# Patient Record
Sex: Male | Born: 1945 | Race: White | Hispanic: No | Marital: Married | Smoking: Never smoker
Health system: Southern US, Community
[De-identification: ages and names within clinical notes are randomized; demographics above are authoritative.]

## PROBLEM LIST (undated history)

## (undated) ENCOUNTER — Emergency Department (HOSPITAL_COMMUNITY): Admission: EM | Payer: Medicare PPO | Source: Home / Self Care

## (undated) DIAGNOSIS — R0609 Other forms of dyspnea: Secondary | ICD-10-CM

## (undated) DIAGNOSIS — E119 Type 2 diabetes mellitus without complications: Secondary | ICD-10-CM

## (undated) DIAGNOSIS — G4733 Obstructive sleep apnea (adult) (pediatric): Secondary | ICD-10-CM

## (undated) DIAGNOSIS — R972 Elevated prostate specific antigen [PSA]: Secondary | ICD-10-CM

## (undated) DIAGNOSIS — N529 Male erectile dysfunction, unspecified: Secondary | ICD-10-CM

## (undated) DIAGNOSIS — I10 Essential (primary) hypertension: Secondary | ICD-10-CM

## (undated) DIAGNOSIS — Z87442 Personal history of urinary calculi: Secondary | ICD-10-CM

## (undated) DIAGNOSIS — E785 Hyperlipidemia, unspecified: Secondary | ICD-10-CM

## (undated) DIAGNOSIS — R0600 Dyspnea, unspecified: Secondary | ICD-10-CM

## (undated) DIAGNOSIS — K219 Gastro-esophageal reflux disease without esophagitis: Secondary | ICD-10-CM

## (undated) DIAGNOSIS — E291 Testicular hypofunction: Secondary | ICD-10-CM

## (undated) DIAGNOSIS — N400 Enlarged prostate without lower urinary tract symptoms: Secondary | ICD-10-CM

## (undated) DIAGNOSIS — I6529 Occlusion and stenosis of unspecified carotid artery: Secondary | ICD-10-CM

## (undated) DIAGNOSIS — U071 COVID-19: Secondary | ICD-10-CM

## (undated) DIAGNOSIS — Z87898 Personal history of other specified conditions: Secondary | ICD-10-CM

## (undated) DIAGNOSIS — C4490 Unspecified malignant neoplasm of skin, unspecified: Secondary | ICD-10-CM

## (undated) DIAGNOSIS — N200 Calculus of kidney: Secondary | ICD-10-CM

## (undated) DIAGNOSIS — G4730 Sleep apnea, unspecified: Secondary | ICD-10-CM

## (undated) DIAGNOSIS — R011 Cardiac murmur, unspecified: Secondary | ICD-10-CM

## (undated) DIAGNOSIS — I453 Trifascicular block: Secondary | ICD-10-CM

## (undated) DIAGNOSIS — T7840XA Allergy, unspecified, initial encounter: Secondary | ICD-10-CM

## (undated) DIAGNOSIS — U099 Post covid-19 condition, unspecified: Secondary | ICD-10-CM

## (undated) HISTORY — DX: Calculus of kidney: N20.0

## (undated) HISTORY — DX: Gastro-esophageal reflux disease without esophagitis: K21.9

## (undated) HISTORY — DX: Essential (primary) hypertension: I10

## (undated) HISTORY — PX: OTHER SURGICAL HISTORY: SHX169

## (undated) HISTORY — DX: Allergy, unspecified, initial encounter: T78.40XA

## (undated) HISTORY — DX: Occlusion and stenosis of unspecified carotid artery: I65.29

## (undated) HISTORY — PX: KIDNEY STONE SURGERY: SHX686

## (undated) HISTORY — DX: Testicular hypofunction: E29.1

## (undated) HISTORY — DX: Elevated prostate specific antigen (PSA): R97.20

## (undated) HISTORY — DX: Hyperlipidemia, unspecified: E78.5

## (undated) HISTORY — DX: Cardiac murmur, unspecified: R01.1

---

## 2006-04-10 HISTORY — PX: KNEE ARTHROSCOPY: SUR90

## 2006-12-07 HISTORY — PX: COLONOSCOPY: SHX174

## 2007-04-11 HISTORY — PX: CARDIAC CATHETERIZATION: SHX172

## 2008-03-16 ENCOUNTER — Encounter: Payer: Self-pay | Admitting: Internal Medicine

## 2008-04-08 ENCOUNTER — Ambulatory Visit: Payer: Self-pay | Admitting: Internal Medicine

## 2008-04-08 DIAGNOSIS — E291 Testicular hypofunction: Secondary | ICD-10-CM

## 2008-04-08 DIAGNOSIS — K219 Gastro-esophageal reflux disease without esophagitis: Secondary | ICD-10-CM

## 2008-04-08 DIAGNOSIS — R972 Elevated prostate specific antigen [PSA]: Secondary | ICD-10-CM | POA: Insufficient documentation

## 2008-04-08 DIAGNOSIS — I10 Essential (primary) hypertension: Secondary | ICD-10-CM | POA: Insufficient documentation

## 2008-04-08 DIAGNOSIS — Z87442 Personal history of urinary calculi: Secondary | ICD-10-CM

## 2008-04-08 HISTORY — DX: Elevated prostate specific antigen (PSA): R97.20

## 2008-04-08 HISTORY — DX: Testicular hypofunction: E29.1

## 2008-04-08 HISTORY — DX: Gastro-esophageal reflux disease without esophagitis: K21.9

## 2008-05-29 ENCOUNTER — Ambulatory Visit: Payer: Self-pay | Admitting: Internal Medicine

## 2008-06-29 ENCOUNTER — Telehealth (INDEPENDENT_AMBULATORY_CARE_PROVIDER_SITE_OTHER): Payer: Self-pay | Admitting: *Deleted

## 2008-10-15 ENCOUNTER — Telehealth (INDEPENDENT_AMBULATORY_CARE_PROVIDER_SITE_OTHER): Payer: Self-pay | Admitting: *Deleted

## 2009-05-21 ENCOUNTER — Ambulatory Visit: Payer: Self-pay | Admitting: Internal Medicine

## 2010-02-11 ENCOUNTER — Ambulatory Visit: Payer: Self-pay | Admitting: Internal Medicine

## 2010-02-11 ENCOUNTER — Telehealth: Payer: Self-pay | Admitting: Internal Medicine

## 2010-02-11 ENCOUNTER — Encounter: Payer: Self-pay | Admitting: Internal Medicine

## 2010-02-11 DIAGNOSIS — N200 Calculus of kidney: Secondary | ICD-10-CM

## 2010-02-11 DIAGNOSIS — N201 Calculus of ureter: Secondary | ICD-10-CM

## 2010-02-11 DIAGNOSIS — R9431 Abnormal electrocardiogram [ECG] [EKG]: Secondary | ICD-10-CM | POA: Insufficient documentation

## 2010-02-11 HISTORY — DX: Calculus of ureter: N20.1

## 2010-02-11 HISTORY — DX: Calculus of kidney: N20.0

## 2010-02-14 ENCOUNTER — Telehealth (INDEPENDENT_AMBULATORY_CARE_PROVIDER_SITE_OTHER): Payer: Self-pay | Admitting: *Deleted

## 2010-02-15 ENCOUNTER — Ambulatory Visit: Payer: Self-pay

## 2010-02-15 ENCOUNTER — Encounter (INDEPENDENT_AMBULATORY_CARE_PROVIDER_SITE_OTHER): Payer: Self-pay | Admitting: *Deleted

## 2010-02-15 ENCOUNTER — Ambulatory Visit: Payer: Self-pay | Admitting: Cardiology

## 2010-02-15 ENCOUNTER — Ambulatory Visit: Payer: Self-pay | Admitting: Internal Medicine

## 2010-02-15 ENCOUNTER — Encounter: Payer: Self-pay | Admitting: Physician Assistant

## 2010-02-15 ENCOUNTER — Encounter: Payer: Self-pay | Admitting: Cardiology

## 2010-02-15 ENCOUNTER — Encounter (HOSPITAL_COMMUNITY)
Admission: RE | Admit: 2010-02-15 | Discharge: 2010-04-09 | Payer: Self-pay | Source: Home / Self Care | Attending: Internal Medicine | Admitting: Internal Medicine

## 2010-02-15 DIAGNOSIS — R9430 Abnormal result of cardiovascular function study, unspecified: Secondary | ICD-10-CM | POA: Insufficient documentation

## 2010-02-16 ENCOUNTER — Telehealth (INDEPENDENT_AMBULATORY_CARE_PROVIDER_SITE_OTHER): Payer: Self-pay | Admitting: *Deleted

## 2010-02-16 LAB — CONVERTED CEMR LAB
Basophils Absolute: 0 10*3/uL (ref 0.0–0.1)
Basophils Relative: 0.4 % (ref 0.0–3.0)
CO2: 27 meq/L (ref 19–32)
Calcium: 9.4 mg/dL (ref 8.4–10.5)
Creatinine, Ser: 1 mg/dL (ref 0.4–1.5)
Eosinophils Absolute: 0.2 10*3/uL (ref 0.0–0.7)
GFR calc non Af Amer: 77.89 mL/min (ref >60)
INR: 1.2 — ABNORMAL HIGH (ref 0.8–1.0)
Lymphocytes Relative: 22.2 % (ref 12.0–46.0)
MCHC: 35.2 g/dL (ref 30.0–36.0)
Neutrophils Relative %: 68.2 % (ref 43.0–77.0)
RBC: 4.8 M/uL (ref 4.22–5.81)
aPTT: 36.5 s — ABNORMAL HIGH (ref 21.7–28.8)

## 2010-02-17 ENCOUNTER — Inpatient Hospital Stay (HOSPITAL_BASED_OUTPATIENT_CLINIC_OR_DEPARTMENT_OTHER): Admission: RE | Admit: 2010-02-17 | Discharge: 2010-02-17 | Payer: Self-pay | Admitting: Cardiovascular Disease

## 2010-02-17 ENCOUNTER — Ambulatory Visit: Payer: Self-pay | Admitting: Cardiovascular Disease

## 2010-02-17 HISTORY — PX: CARDIAC CATHETERIZATION: SHX172

## 2010-02-21 LAB — CONVERTED CEMR LAB
ALT: 19 units/L (ref 0–53)
Albumin: 3.9 g/dL (ref 3.5–5.2)
Basophils Relative: 0.6 % (ref 0.0–3.0)
Bilirubin, Direct: 0.1 mg/dL (ref 0.0–0.3)
CO2: 28 meq/L (ref 19–32)
Chloride: 108 meq/L (ref 96–112)
Direct LDL: 150.3 mg/dL
Eosinophils Relative: 3.8 % (ref 0.0–5.0)
HCT: 41 % (ref 39.0–52.0)
Hemoglobin: 14.2 g/dL (ref 13.0–17.0)
Lymphs Abs: 1.4 10*3/uL (ref 0.7–4.0)
MCHC: 34.7 g/dL (ref 30.0–36.0)
MCV: 88.4 fL (ref 78.0–100.0)
Monocytes Absolute: 0.5 10*3/uL (ref 0.1–1.0)
Neutro Abs: 3.8 10*3/uL (ref 1.4–7.7)
PSA: 4.98 ng/mL — ABNORMAL HIGH (ref 0.10–4.00)
Potassium: 4.4 meq/L (ref 3.5–5.1)
RBC: 4.64 M/uL (ref 4.22–5.81)
TSH: 1.29 microintl units/mL (ref 0.35–5.50)
Total CHOL/HDL Ratio: 5
Total Protein: 6.3 g/dL (ref 6.0–8.3)
VLDL: 13.4 mg/dL (ref 0.0–40.0)
WBC: 5.9 10*3/uL (ref 4.5–10.5)

## 2010-02-24 ENCOUNTER — Ambulatory Visit: Payer: Self-pay | Admitting: Internal Medicine

## 2010-04-10 HISTORY — PX: CYSTOSCOPY WITH URETEROSCOPY, STONE BASKETRY AND STENT PLACEMENT: SHX6378

## 2010-05-10 NOTE — Progress Notes (Signed)
Summary: Nuclear pre procedure  Phone Note Outgoing Call Call back at [facility] Phone [phone]   Call placed by: Rea College, CMA,  February 14, 2010 4:02 PM Call placed to: Patient Summary of Call: Cindy left message with information on [employer] Information Sheet (see scanned document for details).      Nuclear Med Background Indications for Stress Test: Evaluation for Ischemia, Surgical Clearance  Indications Comments: Pending kidney surgery       Nuclear Pre-Procedure Cardiac Risk Factors: Hypertension Height (in): 72

## 2010-05-10 NOTE — Letter (Signed)
Summary: Cardiac Catheterization Instructions- [facility]  [employer], Main Office  [address]   [place], [place] [address]   Phone: [phone]  Fax: [phone]     02/15/2010 MRN: [id number]  [facility] [address] [place], [place]  [address]  Dear Mr. DUTKO,   You are scheduled for a Cardiac Catheterization on 02/17/10 with Dr. Clifton James  Please arrive to the 1st floor of the Heart and Vascular Center at [facility] at 8:30 AM on the day of your procedure. Please do not arrive before 6:30 a.m. Call the Heart and Vascular Center at [phone] if you are unable to make your appointmnet. The Code to get into the parking garage under the building is 2000. Take the elevators to the 1st floor. You must have someone to drive you home. Someone must be with you for the first 24 hours after you arrive home. Please wear clothes that are easy to get on and off and wear slip-on shoes. Do not eat or drink after midnight except water with your medications that morning. Bring all your medications and current insurance cards with you.  ___ DO NOT take these medications before your procedure: ________________________________________________________________  __x_ Make sure you take your aspirin.  ___x You may take ALL of your medications with water that morning. ________________________________________________________________________________________________________________________________  ___ DO NOT take ANY medications before your procedure.  ___ Pre-med instructions:  ________________________________________________________________________________________________________________________________  The usual length of stay after your procedure is 2 to 3 hours. This can vary.  If you have any questions, please call the office at the number listed above.   Ollen Gross, RN, BSN

## 2010-05-10 NOTE — Assessment & Plan Note (Signed)
Summary: FU / WANTS LABS /NWS  #   Vital Signs:  Patient profile:   65 year old male Height:      71 inches Weight:      207 pounds BMI:     28.97 O2 Sat:      97 % on Room air Temp:     97.7 degrees F oral Pulse rate:   65 / minute BP sitting:   136 / 72  (left arm) Cuff size:   regular  Vitals Entered ByZella Ball Ewing (May 21, 2009 8:57 AM)  O2 Flow:  Room air CC: followup/RE   CC:  followup/RE.  History of Present Illness: BP at home usuallyin the 150's at home;  no insurance today;  Pt denies CP, sob, doe, wheezing, orthopnea, pnd, worsening LE edema, palps, dizziness or syncope .  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 1 By Mouth Daily 2)  Aspirin 81 Mg Tbec (Aspirin) .Marland Kitchen.. 1 By Mouth Daily 3)  Amlodipine Besylate 10 Mg Tabs (Amlodipine Besylate) .Marland Kitchen.. 1 By Mouth Once Daily 4)  Fish Oil 300 Mg Caps (Omega-3 Fatty Acids) .... 2 By Mouth Daily 5)  St. Johns Wart 300mg  .... 2 By Mouth Daily 6)  Prevacid 30 Mg Cpdr (Lansoprazole) .Marland Kitchen.. 1 By Mouth Daily 7)  Dhea 25 Mg Tabs (Prasterone (Dhea)) .Marland Kitchen.. 1 By Mouth Daily 8)  Testosterone Cypionate 200 Mg/ml Oil (Testosterone Cypionate) .Marland Kitchen.. 1 Cc Im Q Month 9)  Viagra 100 Mg Tabs (Sildenafil Citrate) .Marland Kitchen.. 1 By Mouth Once Daily As Needed 10)  Labetalol Hcl 300 Mg Tabs (Labetalol Hcl) .Marland Kitchen.. 1po Two Times A Day  Current Medications (verified): 1)  Centrum Silver  Tabs (Multiple Vitamins-Minerals) .Marland Kitchen.. 1 By Mouth Daily 2)  Aspirin 81 Mg Tbec (Aspirin) .Marland Kitchen.. 1 By Mouth Daily 3)  Azor 10-20 Mg Tabs (Amlodipine-Olmesartan) .Marland Kitchen.. 1po Once Daily 4)  Fish Oil 300 Mg Caps (Omega-3  Fatty Acids) .... 2 By Mouth Daily 5)  St. Johns Wart 300mg  .... 2 By Mouth Daily 6)  Ranitidine Hcl 300 Mg Caps (Ranitidine Hcl) .Marland Kitchen.. 1po Once Daily 7)  Testosterone Cypionate 200 Mg/ml Oil (Testosterone Cypionate) .Marland Kitchen.. 1 Cc Im Q Month 8)  Viagra 100 Mg Tabs (Sildenafil Citrate) .Marland Kitchen.. 1 By Mouth Once Daily As Needed 9)  Labetalol Hcl 300 Mg Tabs (Labetalol Hcl) .Marland Kitchen.. 1po Two Times A Day 10)  Prevacid 30 Mg Cpdr (Lansoprazole) .Marland Kitchen.. 1 By Mouth Once Daily  Allergies (verified): 1)  Lipitor (Atorvastatin) 2)  * Diuretic  Past History:  Past Medical History: Last updated: 04/08/2008 Hypertension - severe, hard to control Nephrolithiasis, hx of hypogonadism GERD PSA increased   Past Surgical History: Last updated: 04/08/2008 s/p left knee arthroscopic surgury 2008  Social History: Last updated: 04/08/2008 Married 1 child work - Airline pilot - self employed Never Smoked Alcohol use-no  Risk Factors: Smoking Status: never (04/08/2008)  Review of Systems       all otherwise negative per pt -   Physical Exam  General:  alert and overweight-appearing.   Head:  normocephalic and atraumatic.   Eyes:  vision grossly intact, pupils equal, and pupils  round.   Ears:  R ear normal and L ear normal.   Nose:  no external deformity and no nasal discharge.   Mouth:  no gingival abnormalities and pharynx pink and moist.   Neck:  supple and no masses.   Lungs:  normal respiratory effort and normal breath sounds.   Heart:  normal rate and regular rhythm.   Extremities:  no edema, no erythema    Impression & Recommendations:  Problem # 1:  HYPERTENSION (ICD-401.9)  His updated medication list for this problem includes:    Azor 10-20 Mg Tabs (Amlodipine-olmesartan) .Marland Kitchen... 1po once daily    Labetalol Hcl 300 Mg Tabs (Labetalol hcl) .Marland Kitchen... 1po two times a day meds increased as above, cont to monitor at home, and f/u next visit;  states he has medicare insurance in oct 2011 - wants to f/u  then  Complete Medication List: 1)  Centrum Silver Tabs (Multiple vitamins-minerals) .Marland Kitchen.. 1 by mouth daily 2)  Aspirin 81 Mg Tbec (Aspirin) .Marland Kitchen.. 1 by mouth daily 3)  Azor 10-20 Mg Tabs (Amlodipine-olmesartan) .Marland Kitchen.. 1po once daily 4)  Fish Oil 300 Mg Caps (Omega-3 fatty acids) .... 2 by mouth daily 5)  St. Johns Wart 300mg   .... 2 by mouth daily 6)  Ranitidine Hcl 300 Mg Caps (Ranitidine hcl) .Marland Kitchen.. 1po once daily 7)  Testosterone Cypionate 200 Mg/ml Oil (Testosterone cypionate) .Marland Kitchen.. 1 cc im q month 8)  Viagra 100 Mg Tabs (Sildenafil citrate) .Marland Kitchen.. 1 by mouth once daily as needed 9)  Labetalol Hcl 300 Mg Tabs (Labetalol hcl) .Marland Kitchen.. 1po two times a day 10)  Prevacid 30 Mg Cpdr (Lansoprazole) .Marland Kitchen.. 1 by mouth once daily  Patient Instructions: 1)  stop the amlodipine 2)  start the azor 10/20 mg - 1 per day 3)  continue the labetolol 4)  Continue all previous medications as before this visit  5)  Please schedule a follow-up appointment in November 2011 for the "Initial Medicare Wellness exam" Prescriptions: PREVACID 30 MG CPDR (LANSOPRAZOLE) 1 by mouth once daily  #90 x 3   Entered and Authorized by:   [name] MD   Signed by:   [name] MD on 05/21/2009   Method used:   Print then Give to Patient   RxID:   [id number] AZOR 10-20 MG TABS (AMLODIPINE-OLMESARTAN) 1po once daily  #90 x 3   Entered and Authorized by:   [name] MD   Signed by:   [name] MD on 05/21/2009   Method used:   Print then Give to Patient   RxID:   [id number] VIAGRA 100 MG TABS (SILDENAFIL CITRATE) 1 by mouth once daily as needed  #24 x 5   Entered and Authorized by:   [name] MD   Signed by:   [name] MD on 05/21/2009   Method used:   Print then Give to Patient   RxID:   [id number] TESTOSTERONE CYPIONATE 200 MG/ML OIL (TESTOSTERONE CYPIONATE) 1 cc IM q month  #1 bottle x 11   Entered and Authorized by:   [name] MD   Signed by:   [name] MD on 05/21/2009   Method  used:   Print then Give to Patient   RxID:   [id number] RANITIDINE HCL 300 MG CAPS (RANITIDINE HCL) 1po once daily  #90 x 3   Entered and Authorized by:   [name] MD   Signed by:   [name] MD  on 05/21/2009   Method used:   Print then Give to Patient   RxID:   [id number]    Immunization History:  Tetanus/Td Immunization History:    Tetanus/Td:  historical (01/09/2007)  Influenza Immunization History:    Influenza:  historical (01/08/2009)

## 2010-05-10 NOTE — Progress Notes (Signed)
----   Converted from flag ---- ---- 02/11/2010 1:08 PM, Grant Levins MD wrote: the stress test is meant to be done prior, and he should be called very shortly - this is often completed in 5-7 days;  we can fax OK for surgury after the stress test done but we will need name and fax # of the urologist from pt  ---- 02/11/2010 1:04 PM, Grant Bowers CMA Grant Bowers) wrote: Grant Bowers wanted to know about his surgical clearance and would you be faxing his doctor in [place] the info. Also, will the stress test be scheduled before his surgery? ------------------------------  called pt informed of above information. He will call back  and give the fax# and name of urologist once he gets home.  patient's doctor in [place] is Grant Bowers at [facility] fax#[phone] Attention: Grant Bowers, patients chart # is [id number]  noted .sign

## 2010-05-10 NOTE — Cardiovascular Report (Signed)
Summary: Pre Cath Orders   Pre Cath Orders   Imported By: Roderic Ovens 02/28/2010 16:35:25  _____________________________________________________________________  External Attachment:    Type:   Image     Comment:   External Document

## 2010-05-10 NOTE — Assessment & Plan Note (Signed)
Summary: add on/ abn. stress/saf   Visit Type:  add on from nuclear Primary Provider:  [name] MD  CC:  high BP .  History of Present Illness: Grant Bowers is a 65 yo male with HTN and Hyperlipidemia who has a ureteral stone that requires surgical excision.  His urologist is in [place], [place] where he lives.  His PCP is Dr. Jonny Ruiz who recently saw him for surgical clearance.  He was noted to have an abnormal EKG and was referred for a stress test today.  His images were noted to be abnormal and he was added on to my schedule.  The patient notes he had a heart cath 2 years ago in [place], [place].  He states he was set up for a stress test that was stopped early because of BP.  He says his cath was normal and required no follow up.  The nuclear images have not been officially read.  However, the patient's ejection fraction appears to be 63%.  He appears to have an inferior and apical defect but actually improves with stress.  The patient denies chest pain, dyspnea, orthopnea, PND, pedal edema or syncope.  The patient describes NYHA Class 1-2 symptoms.  He denies exertional arm or jaw pain.  He denies palpitations.  Current Medications (verified): 1)  Centrum Silver  Tabs (Multiple Vitamins-Minerals) .Marland Kitchen.. 1 By Mouth Daily 2)  Aspirin 81 Mg Tbec (Aspirin) .Marland Kitchen.. 1 By Mouth Daily 3)  Azor 10-20 Mg Tabs (Amlodipine-Olmesartan) .Marland Kitchen.. 1po Once Daily 4)  Fish Oil 300 Mg Caps (Omega-3 Fatty Acids) .... 2 By Mouth Daily 5)  St. Johns Wart 300mg  .... 2 By Mouth Daily 6)  Testosterone Cypionate 200 Mg/ml Oil (Testosterone Cypionate) .Marland Kitchen.. 1 Cc Im Q Month 7)  Viagra 100 Mg Tabs (Sildenafil Citrate) .Marland Kitchen.. 1 By Mouth Once Daily As Needed 8)  Labetalol Hcl 300 Mg Tabs (Labetalol Hcl) .Marland Kitchen.. 1po Two Times A Day 9)  Ranitidine Hcl 150 Mg Caps (Ranitidine Hcl) .Marland Kitchen.. 1po Two Times A Day  Allergies (verified): 1)  Lipitor (Atorvastatin) 2)  * Diuretic  Past History:  Past Medical History: Hypertension - severe,  hard to control Hyperlipidemia (zocor started 02/2010) Nephrolithiasis, hx of hypogonadism GERD PSA increased  Cardiac cath at [facility] in 2009: "normal" per patient done for chest pain and abnl stress test  Past Surgical History: Reviewed history from 04/08/2008 and no changes required. s/p left knee arthroscopic surgury 2008  Family History: mother with stroke, died with cancer, rena l stones father with melanoma, HTN, DM, renal stones, pacemaker at 53 brother with colon polyps CAD- bro with PCI at age 19  Social History: Reviewed history from 02/11/2010 and no changes required. 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  The patient denies fevers, chills, cough, melena, hematochezia, rashes, arthralgias, myalgias, heat or cold intolerance.  Please see the HPI.  All other systems reviewed and negative.   Vital Signs:  Patient profile:   65 year old male Height:      72 inches Weight:      202 pounds BMI:     27.50 Pulse rate:   75 / minute BP sitting:   208 / 94  (left arm) Cuff size:   regular  Vitals Entered By: Hardin Negus, RMA (February 15, 2010 2:42 PM)  Physical Exam  General:  Well nourished, well developed in no acute distress HEENT: Normal Neck: No JVD Cardiac:  Normal S1, S2; +S4, 1-2/6 systolic murmur along LSB Lungs:  Clear to auscultation bilaterally, no wheezing, rhonchi or rales Abd: Soft, nontender, no hepatomegaly, no bruits Ext: No clubbing, cyanosis or edema Vascular: No carotid  bruits; Femoral arteries 2+ bilaterally without bruits Skin: Warm and dry MSK:  No deformities Lymph: No adenopathy Endocrine:  No thyromegaly Neuro: CNs 2-12 intact; nonfocal    Impression & Recommendations:  Problem # 1:  NONSPECIFIC ABNORMAL UNSPEC CV FUNCTION STUDY (ICD-794.30) He had an abnormal treadmill test 2 years  ago.  He proceeded to the heart catheterization.  The patient states that this was normal.  I discussed the patient's case today with Dr. Graciela Husbands.  He has an apical defect as well as an inferior defect.  These both improve with stress.  The question really is whether or not the patient has had an intercurrent infarct in the last 2 years.  He has no symptoms.  The best course of action would be to proceed with a cardiac catheterization for diagnostic purposes.  Risks and benefits have been explained to the patient and his wife.  He agrees to proceed.  The patient was also interviewed and examined by Dr. Graciela Husbands.  Problem # 2:  HYPERTENSION (ICD-401.9)  He has been out of his medications for 3 days.  We gave him some samples today of Azor 5/20 mg.  He gets his new insurance card in the next couple of days.  Orders: TLB-BMP (Basic Metabolic Panel-BMET) (80048-METABOL) TLB-CBC Platelet - w/Differential (85025-CBCD) TLB-PT (Protime) (85610-PTP) TLB-PTT (85730-PTTL)  Problem # 3:  RENAL CALCULUS (ICD-592.0) As above, we will proceed with cardiac catheterization.  As long as this shows no significant CAD, he will be cleared for his procedure.  Patient Instructions: 1)  Your physician has requested that you have a cardiac catheterization.  Cardiac catheterization is used to diagnose and/or treat various heart conditions. Doctors may recommend this procedure for a number of different reasons. The most common reason is to evaluate chest pain. Chest pain can be a symptom of coronary artery disease (CAD), and cardiac catheterization can show whether plaque is narrowing or blocking your heart's arteries. This procedure is also used to evaluate the valves, as well as measure the blood flow and oxygen levels in different parts of your heart.  For further information please visit [email or web address]  Please follow instruction sheet, as given.

## 2010-05-10 NOTE — Progress Notes (Signed)
  ROI faxed to [facility] @ [phone] Dr.Chauhan Fax [phone].Records received back gave to [facility]  February 16, 2010 2:30 PM]

## 2010-05-10 NOTE — Miscellaneous (Signed)
Summary: Appointment Canceled  Appointment status changed to canceled by [employer] on 02/11/2010 2:37 PM.  Cancellation Comments --------------------- CRS/Abnormal EKG/WT:204/Insur:Medicare/MD:Dr.John-mb  Appointment Information ----------------------- Appt Type:  CARDIOLOGY NUCLEAR TESTING      Date:  Tuesday, February 22, 2010      Time:  9:15 AM for 15 min   Urgency:  Routine   Made By:  Pearson Grippe  To Visit:  [id number]    Reason:  CRS/Abnormal EKG/WT:204/Insur:Medicare/MD:Dr.John-mb  Appt Comments ------------- -- 02/11/10 14:37: ([facility]) CANCELED -- CRS/Abnormal EKG/WT:204/Insur:Medicare/MD:Dr.John-mb -- 02/11/10 14:26: ([facility]) BOOKED -- Routine CARDIOLOGY NUCLEAR TESTING at 02/22/2010 9:15 AM for 15 min CRS/Abnormal EKG/WT:204/Insur:Medicare/MD:Dr.John-mb

## 2010-05-10 NOTE — Letter (Signed)
Summary: Generic Letter  [facility]  [address] [place], [place] [address]   Phone: [phone]  Fax: [phone]    02/24/2010  Grant Bowers [address] [place], [place]  [address]  Dear Grant Bowers,      This letter is to certify that you are cleared for any  surgical procedure from a medical and cardiac standpoint.         Sincerely,   Oliver Barre MD

## 2010-05-10 NOTE — Assessment & Plan Note (Signed)
Summary: Cardiology Nuclear Testing  Nuclear Med Background Indications for Stress Test: Evaluation for Ischemia, Surgical Clearance, Abnormal EKG  Indications Comments: Pending kidney surgery for stone removal.  History: Heart Catheterization  History Comments: 2-3  years ago: NL per patient.[facility] [place]).  Symptoms: DOE, Palpitations    Nuclear Pre-Procedure Cardiac Risk Factors: Family History - CAD, Hypertension, Lipids Caffeine/Decaff Intake: NONE NPO After: 8:00 PM Lungs: Clear IV 0.9% NS with Angio Cath: 22g     IV Site: R Wrist IV Started by: Cathlyn Parsons, RN Chest Size (in) 44     Height (in): 72 Weight (lb): 201 BMI: 27.36 Tech Comments: LABETALOL HELD X 12HRS. The patient has been off Azor 2 days.  Nuclear Med Study 1 or 2 day study:  1 day     Stress Test Type:  Stress Reading MD:  Willa Rough, MD     Referring MD:  Oliver Barre Resting Radionuclide:  Technetium 21m Tetrofosmin     Resting Radionuclide Dose:  11.0 mCi  Stress Radionuclide:  Technetium 40m Tetrofosmin     Stress Radionuclide Dose:  32.7 mCi   Stress Protocol Exercise Time (min):  7:01 min     Max HR:  142 bpm Max Systolic BP: 225 mm Hg     METS: 8.5 Rate Pressure Product:  73220    Stress Test Technologist:  Irean Hong,  RN     Nuclear Technologist:  Doyne Keel, CNMT  Rest Procedure  Myocardial perfusion imaging was performed at rest 45 minutes following the intravenous administration of Technetium 31m Tetrofosmin.  Stress Procedure  The patient exercised for 7 minutes and 01 second, RPE=15.  The patient stopped due to DOE, fatigue  and  chest pain less than a second when started walking treadmill. There were nonspecific ST-T wave changes, frequent PVC's. The patient had a hypertensive response to exercise.  Technetium 47m Tetrofosmin was injected at peak exercise and myocardial perfusion imaging was performed after a brief delay.The images reviewed by Dr Odessa Fleming, and the patient  will have an office visit with Tereso Newcomer, [facility]  now.   QPS Raw Data Images:  Patient motion noted; appropriate software correction applied. Stress Images:  Mild apical thinning Rest Images:  Same as stress Subtraction (SDS):  No evidence of ischemia. Transient Ischemic Dilatation:  0.88  (Normal <1.22)  Lung/Heart Ratio:  0.26  (Normal <0.45)  Quantitative Gated Spect Images QGS EDV:  121 ml QGS ESV:  44 ml QGS EF:  63 % QGS cine images:  Normal motion  Findings Normal nuclear study      Overall Impression  Exercise Capacity: Fair exercise capacity. BP Response: Hypertensive blood pressure response. Clinical Symptoms: SOB ECG Impression: No significant ST segment change suggestive of ischemia. Overall Impression: Normal stress nuclear study. Overall Impression Comments: Hypertensive respose to stress.

## 2010-05-10 NOTE — Assessment & Plan Note (Signed)
Summary: surgical clearance for kidney surgery/#/cd   Vital Signs:  Patient profile:   65 year old male Height:      72 inches Weight:      204.38 pounds BMI:     27.82 O2 Sat:      92 % on Room air Temp:     97.8 degrees F oral Pulse rate:   60 / minute BP sitting:   130 / 72  (left arm) Cuff size:   large  Vitals Entered By: Zella Ball Ewing CMA Duncan Dull) (February 11, 2010 11:41 AM)  O2 Flow:  Room air  Preventive Care Screening     decliens pneumovx  CC: Surgical Clearance, flu shot/RE   CC:  Surgical Clearance and flu shot/RE.  History of Present Illness: here with known renal stone not able to pass r6 mm- right with recurring gross hematuria for 2 mo;  for proceure per [facility] urology but needs clearance;  Pt denies CP, worsening sob, doe, wheezing, orthopnea, pnd, worsening LE edema, palps, dizziness or syncope  Pt denies new neuro symptoms such as headache, facial or extremity weakness  Pt denies polydipsia, polyuria,  Overall good compliance with meds, trying to follow low chol  diet, wt stable, little excercise however  No fever, wt loss, night sweats, loss of appetite or other constitutional symptoms  Denies worsening depressive symptoms, suicidal ideation, or panic.   occasionally active only with regular excercise.   Preventive Screening-Counseling & Management      Drug Use:  no.    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 1 By Mouth Daily 2)  Aspirin 81 Mg Tbec (Aspirin) .Marland Kitchen.. 1 By Mouth Daily 3)  Azor 10-20 Mg Tabs (Amlodipine-Olmesartan)  .Marland Kitchen.. 1po Once Daily 4)  Fish Oil 300 Mg Caps (Omega-3 Fatty Acids) .... 2 By Mouth Daily 5)  St. Johns Wart 300mg  .... 2 By Mouth Daily 6)  Ranitidine Hcl 300 Mg Caps (Ranitidine Hcl) .Marland Kitchen.. 1po Once Daily 7)  Testosterone Cypionate 200 Mg/ml Oil (Testosterone Cypionate) .Marland Kitchen.. 1 Cc Im Q Month 8)  Viagra 100 Mg Tabs (Sildenafil Citrate) .Marland Kitchen.. 1 By Mouth Once Daily As Needed 9)  Labetalol Hcl 300 Mg Tabs (Labetalol Hcl) .Marland Kitchen.. 1po Two Times A Day 10)  Prevacid 30 Mg Cpdr (Lansoprazole) .Marland Kitchen.. 1 By Mouth Once Daily  Current Medications (verified): 1)  Centrum Silver  Tabs (Multiple Vitamins-Minerals) .Marland Kitchen.. 1 By Mouth Daily 2)  Aspirin 81 Mg Tbec (Aspirin) .Marland Kitchen.. 1 By Mouth Daily 3)  Azor 10-20 Mg Tabs (Amlodipine-Olmesartan) .Marland Kitchen.. 1po Once Daily 4)  Fish Oil 300 Mg Caps (Omega-3 Fatty Acids) .... 2 By Mouth Daily 5)  St. Johns Wart 300mg  .... 2 By Mouth Daily 6)  Testosterone Cypionate 200 Mg/ml Oil (Testosterone Cypionate) .Marland Kitchen.. 1 Cc Im Q Month 7)  Viagra 100 Mg Tabs (Sildenafil Citrate) .Marland Kitchen.. 1 By Mouth Once Daily As Needed 8)  Labetalol Hcl 300 Mg Tabs (Labetalol Hcl) .Marland Kitchen.. 1po Two Times A Day 9)  Ranitidine Hcl 150 Mg Caps (Ranitidine Hcl) .Marland Kitchen.. 1po Two Times A Day  Allergies (verified): 1)  Lipitor (Atorvastatin)  2)  * Diuretic  Past History:  Past Medical History: Last updated: 05/06/2008 Hypertension - severe, hard to control Nephrolithiasis, hx of hypogonadism GERD PSA increased   Past Surgical History: Last updated: May 06, 2008 s/p left knee arthroscopic surgury 2008  Family History: Last updated: 05-06-2008 mother with stroke, died with cancer, rena l stones father with melanoma, HTN, DM, renal stones brother with colon polyps  Social History: Last updated: 02/11/2010 Married 1 child work - Airline pilot - self employed Never Smoked Alcohol use-no Drug use-no  Risk Factors: Smoking Status: never (05-06-2008)  Social History: Married 1 child work - Airline pilot - self employed Never  Smoked Alcohol use-no Drug use-no Drug Use:  no  Review of Systems       all otherwise negative per pt -  no overt reflux, dysphaiga, n/v, bowel change or blood.  Physical Exam  General:  alert and overweight-appearing.   Head:  normocephalic and atraumatic.   Eyes:  vision grossly intact, pupils equal, and pupils round.   Ears:  R ear normal and L ear normal.   Nose:  no external deformity and no nasal discharge.   Mouth:  no gingival abnormalities and pharynx pink and moist.   Neck:  supple and no masses.   Lungs:  normal respiratory effort and normal breath sounds.   Heart:  normal rate and regular rhythm.   Abdomen:  soft, non-tender, and normal bowel sounds.   Msk:  no joint tenderness and no joint swelling, no flank tender.   Extremities:  no edema, no erythema  Neurologic:  cranial nerves II-XII intact and strength normal in all extremities.   Skin:  color normal and no rashes.   Psych:  not depressed appearing and slightly anxious.     Impression & Recommendations:  Problem # 1:  HYPERTENSION (ICD-401.9)  His updated medication list for this problem includes:    Azor 10-20 Mg Tabs (Amlodipine-olmesartan) .Marland Kitchen... 1po once daily    Labetalol Hcl 300 Mg Tabs (Labetalol hcl) .Marland Kitchen... 1po two times a day  BP today: 130/72 Prior BP: 136/72 (05/21/2009) stable overall by hx and exam, ok to continue meds/tx as is   Orders: TLB-Lipid Panel (80061-LIPID)  Problem # 2:  RENAL CALCULUS (ICD-592.0) with known 6 mm stone for ureteroscopy soon - to f/u [facility], Continue all previous medications as before this visit   Problem # 3:  GERD (ICD-530.81)  The following medications were removed from the medication list:    Ranitidine Hcl 300 Mg Caps (Ranitidine hcl) .Marland Kitchen... 1po once daily    Prevacid 30 Mg Cpdr (Lansoprazole) .Marland Kitchen... 1 by mouth once daily His updated medication list for this problem includes:    Ranitidine Hcl 150 Mg Caps (Ranitidine hcl) .Marland Kitchen... 1po two times a  day stable overall by hx and exam, ok to continue meds/tx as is   Problem # 4:  ELECTROCARDIOGRAM, ABNORMAL (ICD-794.31) ecg reviewed with pt Orders: EKG w/ Interpretation (93000) Cardiolite (Cardiolite)  Problem # 5:  PREOPERATIVE EXAMINATION (ICD-V72.84)  with abnormal ECG suggestive with poor anterior R wave progresison, and upcoming procedure, wil need stress test, and labs today  Orders: Cardiolite (Cardiolite) TLB-BMP (Basic Metabolic Panel-BMET) (80048-METABOL) TLB-CBC Platelet - w/Differential (85025-CBCD) TLB-Hepatic/Liver Function Pnl (80076-HEPATIC)  Complete Medication List: 1)  Centrum Silver Tabs (Multiple vitamins-minerals) .Marland Kitchen.. 1 by mouth daily 2)  Aspirin 81 Mg Tbec (Aspirin) .Marland Kitchen.. 1 by mouth daily 3)  Azor 10-20 Mg Tabs (Amlodipine-olmesartan) .Marland Kitchen.. 1po once daily 4)  Fish Oil 300 Mg Caps (  Omega-3 fatty acids) .... 2 by mouth daily 5)  St. Johns Wart 300mg   .... 2 by mouth daily 6)  Testosterone Cypionate 200 Mg/ml Oil (Testosterone cypionate) .Marland Kitchen.. 1 cc im q month 7)  Viagra 100 Mg Tabs (Sildenafil citrate) .Marland Kitchen.. 1 by mouth once daily as needed 8)  Labetalol Hcl 300 Mg Tabs (Labetalol hcl) .Marland Kitchen.. 1po two times a day 9)  Ranitidine Hcl 150 Mg Caps (Ranitidine hcl) .Marland Kitchen.. 1po two times a day  Other Orders: Flu Vaccine 2yrs + MEDICARE PATIENTS (B1478) Administration Flu vaccine - MCR (G0008) TLB-PSA (Prostate Specific Antigen) (84153-PSA) TLB-PSA (Prostate Specific Antigen) (84153-PSA) TLB-TSH (Thyroid Stimulating Hormone) (84443-TSH)  Patient Instructions: 1)  Your EKG is probably OK for you, but to be sure with the procedure upcoming, You will be contacted about the referral(s) to:  Stress test 2)  Continue all previous medications as before this visit  3)  Please go to the Lab in the basement for your blood and/or urine tests today 4)  Please call the number on the [facility] Card for results of your testing  5)  Please schedule a follow-up appointment in 1 year, or  sooner if needed Prescriptions: RANITIDINE HCL 150 MG CAPS (RANITIDINE HCL) 1po two times a day  #180 x 3   Entered and Authorized by:   [name] MD   Signed by:   [name] MD on 02/11/2010   Method used:   Print then Give to Patient   RxID:   [id number] LABETALOL HCL 300 MG TABS (LABETALOL HCL) 1po two times a day  #180 x 3   Entered and Authorized by:   [name] MD   Signed by:   [name] MD on 02/11/2010   Method used:   Print then Give to Patient   RxID:   [id number] VIAGRA 100 MG TABS (SILDENAFIL CITRATE) 1 by mouth once daily as needed  #24 x 5   Entered and Authorized by:   [name] MD   Signed by:   [name] MD on 02/11/2010   Method used:   Print then Give to Patient   RxID:   [id number] TESTOSTERONE CYPIONATE 200 MG/ML OIL (TESTOSTERONE CYPIONATE) 1 cc IM q month  #1bottle x 5   Entered and Authorized by:   [name] MD   Signed by:   [name] MD on 02/11/2010   Method used:   Print then Give to Patient   RxID:   [id number] AZOR 10-20 MG TABS (AMLODIPINE-OLMESARTAN) 1po once daily  #90 x 3   Entered and Authorized by:   [name] MD   Signed by:   [name] MD on 02/11/2010   Method used:   Print then Give to Patient   RxID:   [id number]    Orders Added: 1)  Flu Vaccine 69yrs + MEDICARE PATIENTS [Q2039] 2)  Administration Flu vaccine - MCR [G0008] 3)  TLB-PSA (Prostate Specific Antigen) [84153-PSA] 4)  EKG w/ Interpretation [93000] 5)  Cardiolite [Cardiolite] 6)  TLB-PSA (Prostate Specific Antigen) [84153-PSA] 7)  TLB-Lipid Panel [80061-LIPID] 8)  TLB-BMP (Basic Metabolic Panel-BMET) [80048-METABOL] 9)  TLB-CBC Platelet - w/Differential [85025-CBCD] 10)  TLB-Hepatic/Liver Function Pnl [80076-HEPATIC] 11)  TLB-TSH (Thyroid Stimulating Hormone) [84443-TSH] 12)  Est. Patient Level IV [35573]  Flu Vaccine Consent Questions     Do you have a history of severe allergic reactions to this vaccine? no     Any prior history of  allergic reactions to egg and/or gelatin? no    Do you have a sensitivity to the preservative Thimersol? no    Do you have a past history of Guillan-Barre Syndrome? no    Do you currently have an acute febrile illness? no    Have you ever had a severe reaction to latex? no    Vaccine information given and explained to patient? yes    Are you currently pregnant? no    Lot Number:[id number]   Exp Date:10/08/2010   Site Given  Left Deltoid IM        .[email or web address]

## 2010-05-10 NOTE — Assessment & Plan Note (Signed)
Summary: POST CATH/FOLLOW UP/LB   Vital Signs:  Patient profile:   65 year old male Height:      72 inches Weight:      200.13 pounds BMI:     27.24 O2 Sat:      96 % on Room air Temp:     98.4 degrees F oral Pulse rate:   62 / minute BP sitting:   110 / 60  (left arm) Cuff size:   regular  Vitals Entered By: Zella Ball Ewing CMA Duncan Dull) (February 24, 2010 2:40 PM)  O2 Flow:  Room air CC: post Cath./RE   Primary Care Alizah Sills:  [name] MD  CC:  post Cath./RE.  History of Present Illness: here after recent abnl ECG and apparently false pos stress test, with subsequent cardiac cath with normal cor's;  need letter for OK for renal calculous procedure scheduled for nov 29;  Pt denies CP, worsening sob, doe, wheezing, orthopnea, pnd, worsening LE edema, palps, dizziness or syncope  Pt denies new neuro symptoms such as headache, facial or extremity weakness  Pt denies polydipsia, polyuria.  No increased or change in flank pain or dysuria, freq, urgency or gross hematuria. 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 1 By Mouth Daily 2)  Aspirin 81 Mg Tbec (Aspirin) .Marland Kitchen.. 1 By Mouth Daily 3)  Azor 10-20 Mg Tabs (Amlodipine-Olmesartan) .Marland Kitchen.. 1po Once Daily 4)  Fish Oil 300 Mg Caps (Omega-3 Fatty Acids) .... 2 By Mouth Daily 5)  St. Johns Wart 300mg  .... 2 By Mouth Daily 6)   Testosterone Cypionate 200 Mg/ml Oil (Testosterone Cypionate) .Marland Kitchen.. 1 Cc Im Q Month 7)  Viagra 100 Mg Tabs (Sildenafil Citrate) .Marland Kitchen.. 1 By Mouth Once Daily As Needed 8)  Labetalol Hcl 300 Mg Tabs (Labetalol Hcl) .Marland Kitchen.. 1po Two Times A Day 9)  Ranitidine Hcl 150 Mg Caps (Ranitidine Hcl) .Marland Kitchen.. 1po Two Times A Day 10)  Simvastatin 20 Mg Tabs (Simvastatin) .Marland Kitchen.. 1po Once Daily  Current Medications (verified): 1)  Centrum Silver  Tabs (Multiple Vitamins-Minerals) .Marland Kitchen.. 1 By Mouth Daily 2)  Aspirin 81 Mg Tbec (Aspirin) .Marland Kitchen.. 1 By Mouth Daily 3)  Azor 10-20 Mg Tabs (Amlodipine-Olmesartan) .Marland Kitchen.. 1po Once Daily 4)  Fish Oil 300 Mg Caps (Omega-3 Fatty Acids) .... 2 By Mouth Daily 5)  St. Johns Wart 300mg  .... 2 By Mouth Daily 6)  Testosterone Cypionate 200 Mg/ml Oil (Testosterone Cypionate) .Marland Kitchen.. 1 Cc Im Q Month 7)  Viagra 100 Mg Tabs (Sildenafil Citrate) .Marland Kitchen.. 1 By Mouth Once Daily As Needed 8)  Labetalol Hcl 300 Mg Tabs (Labetalol Hcl) .Marland Kitchen.. 1po Two Times A Day 9)  Ranitidine Hcl 150 Mg Caps (Ranitidine Hcl) .Marland Kitchen.. 1po Two Times A Day 10)  Simvastatin 20 Mg Tabs (Simvastatin) .Marland Kitchen.. 1po Once Daily 11)  Levitra 20 Mg Tabs (Vardenafil Hcl) .Marland Kitchen.. 1 By Mouth Every Other Day As Needed  Allergies (verified): 1)  Lipitor (Atorvastatin)  2)  * Diuretic  Past History:  Past Medical History: Last updated: 02/15/2010 Hypertension - severe, hard to control Hyperlipidemia (zocor started 02/2010) Nephrolithiasis, hx of hypogonadism GERD PSA increased  Card[facility] in 2009: "normal" per patient done for chest pain and abnl stress test  Past Surgical History: Last updated: 04/08/2008 s/p left knee arthroscopic surgury 2008  Social History: Last updated: 02/11/2010 Married 1 child work - Airline pilot - self employed Never Smoked Alcohol use-no Drug use-no  Risk Factors: Smoking Status: never (04/08/2008)  Review of Systems       all otherwise negative per pt -     Impression &  Recommendations:  Problem # 1:  RENAL CALCULUS (ICD-592.0) OK for procedure - note given  Problem # 2:  PSA, INCREASED (ICD-790.93) also noted per recent labs, hardcopy given to pt who states he will be responsible  for bringing up with urology at next appt  Problem # 3:  HYPOGONADISM, MALE (ICD-257.2) with ed - for levitra 20 mg as needed due to cost  Problem # 4:  HYPERTENSION (ICD-401.9)  The following medications were removed from the medication list:    Amlodipine Besylate 5 Mg Tabs (Amlodipine besylate) .Marland Kitchen... 1 by mouth once daily His updated medication list for this problem includes:    Azor 10-20 Mg Tabs (Amlodipine-olmesartan) .Marland Kitchen... 1po once daily    Labetalol Hcl 300 Mg Tabs (Labetalol hcl) .Marland Kitchen... 1po two times a day  BP today: 110/60 Prior BP: 208/94 (02/15/2010)  Labs Reviewed: K+: 4.1 (02/15/2010) Creat: : 1.0 (02/15/2010)   Chol: 205 (02/11/2010)   HDL: 45.00 (02/11/2010)   TG: 67.0 (02/11/2010) stable overall by hx and exam, ok to continue meds/tx as is ;  due to cost and insurance problem with drug coverage pt given 5 wks azor 5/20 samples and temp rx for amloidpine to use to cut cost  Complete Medication List: 1)  Centrum Silver Tabs (Multiple vitamins-minerals) .Marland Kitchen.. 1 by mouth daily 2)  Aspirin 81 Mg Tbec (Aspirin) .Marland Kitchen.. 1 by mouth daily 3)  Azor 10-20 Mg Tabs (Amlodipine-olmesartan) .Marland Kitchen.. 1po once daily 4)  Fish Oil 300 Mg Caps (Omega-3 fatty acids) .... 2 by mouth daily 5)  St. Johns Wart 300mg   .... 2 by mouth daily 6)  Testosterone Cypionate 200 Mg/ml Oil (Testosterone cypionate) .Marland Kitchen.. 1 cc im q month 7)  Viagra 100 Mg Tabs (Sildenafil citrate) .Marland Kitchen.. 1 by mouth once daily as needed 8)  Labetalol Hcl 300 Mg Tabs (Labetalol hcl) .Marland Kitchen.. 1po two times a day 9)  Ranitidine Hcl 150 Mg Caps (Ranitidine hcl) .Marland Kitchen.. 1po two times a day 10)  Simvastatin 20 Mg Tabs (Simvastatin) .Marland Kitchen.. 1po once daily 11)  Levitra 20 Mg Tabs (Vardenafil hcl) .Marland Kitchen.. 1 by mouth every other day as  needed  Patient Instructions: 1)  Please take all new medications as prescribed 2)  Continue all previous medications as before this visit  3)  you are given the note for ok for surgury 4)  you are given the azor samples today  - the 5/20 mg samples plus the 5 mg amlodipine prescriptoin 5)  Please schedule a follow-up appointment as needed. Prescriptions: AMLODIPINE BESYLATE 5 MG TABS (AMLODIPINE BESYLATE) 1 by mouth once daily  #30 x 2   Entered and Authorized by:   [name] MD   Signed by:   [name] MD on 02/24/2010   Method used:   Print then Give to Patient   RxID:  [id number] VIAGRA 100 MG TABS (SILDENAFIL CITRATE) 1 by mouth once daily as needed  #24 x 5   Entered and Authorized by:   [name] MD   Signed by:   [name] MD on 02/24/2010   Method used:   Print then Give to Patient   RxID:   [id number] LEVITRA 20 MG TABS (VARDENAFIL HCL) 1 by mouth every other day as needed  #24 x 0   Entered and Authorized by:   [name] MD   Signed by:   [name] MD on 02/24/2010   Method used:   Print then Give to Patient   RxID:   [id number]    Orders Added: 1)  Est. Patient Level IV [18841]

## 2010-06-01 ENCOUNTER — Telehealth: Payer: Self-pay | Admitting: Internal Medicine

## 2010-06-07 NOTE — Progress Notes (Signed)
  Phone Note Refill Request Message from:  Fax from Pharmacy on June 01, 2010 4:00 PM  Refills Requested: Medication #1:  AZOR 10-20 MG TABS 1po once daily   Dosage confirmed as above?Dosage Confirmed   Notes: [facility] [place] Initial call taken by: Robin Ewing CMA (AAMA),  June 01, 2010 4:00 PM    Prescriptions: AZOR 10-20 MG TABS (AMLODIPINE-OLMESARTAN) 1po once daily  #90 x 3   Entered by:   Zella Ball Ewing CMA (AAMA)   Authorized by:   [name] MD   Signed by:   Scharlene Gloss CMA (AAMA) on 06/01/2010   Method used:   Faxed to ...       [facility]  [address] # [phone]* (retail)       [address]       [place], [place]  [address]       Ph: [phone] or [phone]       Fax: [phone]   RxID:   [id number]

## 2010-07-07 ENCOUNTER — Other Ambulatory Visit: Payer: Self-pay | Admitting: Internal Medicine

## 2010-07-08 NOTE — Telephone Encounter (Signed)
To robin   

## 2010-07-20 ENCOUNTER — Other Ambulatory Visit: Payer: Self-pay

## 2010-07-20 MED ORDER — TADALAFIL 20 MG PO TABS: 20.0000 mg | ORAL_TABLET | Freq: Every day | ORAL | Status: DC | PRN

## 2011-03-11 ENCOUNTER — Encounter: Payer: Self-pay | Admitting: Internal Medicine

## 2011-03-11 DIAGNOSIS — Z0000 Encounter for general adult medical examination without abnormal findings: Secondary | ICD-10-CM | POA: Insufficient documentation

## 2011-03-11 DIAGNOSIS — Z0001 Encounter for general adult medical examination with abnormal findings: Secondary | ICD-10-CM | POA: Insufficient documentation

## 2011-03-13 ENCOUNTER — Encounter: Payer: Self-pay | Admitting: Internal Medicine

## 2011-03-13 ENCOUNTER — Ambulatory Visit (INDEPENDENT_AMBULATORY_CARE_PROVIDER_SITE_OTHER): Payer: 59 | Admitting: Internal Medicine

## 2011-03-13 ENCOUNTER — Other Ambulatory Visit (INDEPENDENT_AMBULATORY_CARE_PROVIDER_SITE_OTHER): Payer: 59

## 2011-03-13 VITALS — BP 122/70 | HR 61 | Temp 98.1°F | Ht 72.0 in | Wt 209.1 lb

## 2011-03-13 DIAGNOSIS — E291 Testicular hypofunction: Secondary | ICD-10-CM

## 2011-03-13 DIAGNOSIS — Z23 Encounter for immunization: Secondary | ICD-10-CM

## 2011-03-13 DIAGNOSIS — Z79899 Other long term (current) drug therapy: Secondary | ICD-10-CM

## 2011-03-13 DIAGNOSIS — Z0000 Encounter for general adult medical examination without abnormal findings: Secondary | ICD-10-CM

## 2011-03-13 LAB — BASIC METABOLIC PANEL
BUN: 14 mg/dL (ref 6–23)
CO2: 26 mEq/L (ref 19–32)
GFR: 66.26 mL/min (ref >60.00)
Glucose, Bld: 120 mg/dL — ABNORMAL HIGH (ref 70–99)
Potassium: 4.7 mEq/L (ref 3.5–5.1)

## 2011-03-13 LAB — HEPATIC FUNCTION PANEL
AST: 18 U/L (ref 0–37)
Albumin: 4.1 g/dL (ref 3.5–5.2)
Total Protein: 6.5 g/dL (ref 6.0–8.3)

## 2011-03-13 LAB — CBC WITH DIFFERENTIAL/PLATELET
Basophils Absolute: 0 10*3/uL (ref 0.0–0.1)
Eosinophils Relative: 2.4 % (ref 0.0–5.0)
HCT: 42.4 % (ref 39.0–52.0)
Lymphocytes Relative: 23.3 % (ref 12.0–46.0)
Lymphs Abs: 1.2 10*3/uL (ref 0.7–4.0)
Monocytes Relative: 8.6 % (ref 3.0–12.0)
Neutrophils Relative %: 65.3 % (ref 43.0–77.0)
Platelets: 200 10*3/uL (ref 150.0–400.0)
RDW: 13.1 % (ref 11.5–14.6)
WBC: 5.3 10*3/uL (ref 4.5–10.5)

## 2011-03-13 LAB — URINALYSIS, ROUTINE W REFLEX MICROSCOPIC
Bilirubin Urine: NEGATIVE
Ketones, ur: NEGATIVE
Leukocytes, UA: NEGATIVE
Urine Glucose: NEGATIVE
Urobilinogen, UA: 1 (ref 0.0–1.0)

## 2011-03-13 LAB — TSH: TSH: 1.24 u[IU]/mL (ref 0.35–5.50)

## 2011-03-13 MED ORDER — TADALAFIL 20 MG PO TABS: 20.0000 mg | ORAL_TABLET | Freq: Every day | ORAL | Status: DC | PRN

## 2011-03-13 NOTE — Progress Notes (Signed)
Subjective:    Patient ID: Grant Bowers, male    DOB: 1946/02/27, 65 y.o.   MRN: [id number]  HPI  Here for wellness and f/u;  Overall doing ok;  Pt denies CP, worsening SOB, DOE, wheezing, orthopnea, PND, worsening LE edema, palpitations, dizziness or syncope.  Pt denies neurological change such as new Headache, facial or extremity weakness.  Pt denies polydipsia, polyuria, or low sugar symptoms. Pt states overall good compliance with treatment and medications, good tolerability, and trying to follow lower cholesterol diet.  Pt denies worsening depressive symptoms, suicidal ideation or panic. No fever, wt loss, night sweats, loss of appetite, or other constitutional symptoms.  Pt states good ability with ADL's, low fall risk, home safety reviewed and adequate, no significant changes in hearing or vision, and occasionally active with exercise.  Did have renal stone x 2 - tx per Royston Bake, [facility].   Past Medical History  Diagnosis Date  . PSA, INCREASED 04/08/2008  . 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 HYPERTENSION 04/08/2008  . GERD 04/08/2008  . RENAL CALCULUS 02/11/2010   No past surgical history on file.  reports that he has never smoked. He does not have any smokeless tobacco history on file. His alcohol and drug histories not on file. family history is not on file. Allergies  Allergen Reactions  . Atorvastatin    Current Outpatient Prescriptions on File Prior to Visit  Medication Sig Dispense Refill  . labetalol (NORMODYNE) 300 MG tablet take 1 tablet twice a day  180 tablet  3   Review of Systems Review of Systems  Constitutional: Negative for diaphoresis, activity change, appetite change and unexpected weight change.  HENT: Negative for hearing loss, ear pain, facial swelling, mouth sores and neck stiffness.   Eyes: Negative for pain, redness and visual disturbance.  Respiratory: Negative for shortness of breath and wheezing.   Cardiovascular: Negative for chest pain and  palpitations.  Gastrointestinal: Negative for diarrhea, blood in stool, abdominal distention and rectal pain.  Genitourinary: Negative for hematuria, flank pain and decreased urine volume.  Musculoskeletal: Negative for myalgias and joint swelling.  Skin: Negative for color change and wound.  Neurological: Negative for syncope and numbness.  Hematological: Negative for adenopathy.  Psychiatric/Behavioral: Negative for hallucinations, self-injury, decreased concentration and agitation.        Objective:   Physical Exam BP 122/70  Pulse 61  Temp(Src) 98.1 F (36.7 C) (Oral)  Ht 6' (1.829 m)  Wt 209 lb 2 oz (94.858 kg)  BMI 28.36 kg/m2  SpO2 95% Physical Exam  VS noted Constitutional: Pt is oriented to person, place, and time. Appears well-developed and well-nourished.  HENT:  Head: Normocephalic and atraumatic.  Right Ear: External ear normal.  Left Ear: External ear normal.  Nose: Nose normal.  Mouth/Throat: Oropharynx is clear and moist.  Eyes: Conjunctivae and EOM are normal. Pupils are equal, round, and reactive to light.  Neck: Normal range of motion. Neck supple. No JVD present. No tracheal deviation present.  Cardiovascular: Normal rate, regular rhythm, normal heart sounds and intact distal pulses.   Pulmonary/Chest: Effort normal and breath sounds normal.  Abdominal: Soft. Bowel sounds are normal. There is no tenderness.  Musculoskeletal: Normal range of motion. Exhibits no edema.  Lymphadenopathy:  Has no cervical adenopathy.  Neurological: Pt is alert and oriented to person, place, and time. Pt has normal reflexes. No cranial nerve deficit.  Skin: Skin is warm and dry. No rash noted.  Psychiatric:  Has  normal mood and  affect. Behavior is normal.     Assessment & Plan:

## 2011-03-13 NOTE — Patient Instructions (Addendum)
You had the flu shot today Continue all other medications as before Please have the pharmacy call with any refills you may need Please go to LAB in the Basement for the blood and/or urine tests to be done today Please call the phone number [phone] (the [employer]Tree System) for results of testing in 2-3 days;  When calling, simply dial the number, and when prompted enter the MRN number above (the Medical Record Number) and the # key, then the message should start. Please return in 1 year for your yearly visit, or sooner if needed, with Lab testing done 3-5 days before

## 2011-03-13 NOTE — Assessment & Plan Note (Signed)

## 2011-03-14 LAB — TESTOSTERONE, FREE, TOTAL, SHBG
Testosterone, Free: 131.4 pg/mL (ref 47.0–244.0)
Testosterone-% Free: 2.3 % (ref 1.6–2.9)
Testosterone: 560.92 ng/dL (ref 250–890)

## 2011-03-15 LAB — HEMOGLOBIN A1C: Hgb A1c MFr Bld: 5.9 % (ref 4.6–6.5)

## 2011-03-16 ENCOUNTER — Telehealth: Payer: Self-pay

## 2011-03-16 MED ORDER — TADALAFIL 20 MG PO TABS: 20.0000 mg | ORAL_TABLET | Freq: Every day | ORAL | Status: DC | PRN

## 2011-03-16 NOTE — Telephone Encounter (Signed)
Patient is requesting a hardcopy of Cialis so he can shop around for the best price. Patient will pickup when ready. Call back number is  [phone]

## 2011-03-16 NOTE — Telephone Encounter (Signed)
Done hardcopy to robin  

## 2011-03-17 NOTE — Telephone Encounter (Signed)
Patient informed to pickup prescription requested at front desk

## 2011-05-30 ENCOUNTER — Other Ambulatory Visit: Payer: Self-pay | Admitting: Internal Medicine

## 2011-06-02 ENCOUNTER — Other Ambulatory Visit: Payer: Self-pay

## 2011-06-02 MED ORDER — AMLODIPINE-OLMESARTAN 10-20 MG PO TABS: 1.0000 | ORAL_TABLET | Freq: Every day | ORAL | Status: DC

## 2011-06-06 ENCOUNTER — Telehealth: Payer: Self-pay

## 2011-06-06 MED ORDER — AMLODIPINE BESYLATE 10 MG PO TABS: 10.0000 mg | ORAL_TABLET | Freq: Every day | ORAL | Status: DC

## 2011-06-06 MED ORDER — VALSARTAN 160 MG PO TABS: 160.0000 mg | ORAL_TABLET | Freq: Every day | ORAL | Status: DC

## 2011-06-06 NOTE — Telephone Encounter (Signed)
Called left message to call back 

## 2011-06-06 NOTE — Telephone Encounter (Signed)
Patient informed. 

## 2011-06-06 NOTE — Telephone Encounter (Signed)
Pharmacy requesting PA or alternative on Amlodipine/Atorvastatin 10/20 mg, please advise to proceed with PA or supply with alternative.

## 2011-06-06 NOTE — Telephone Encounter (Signed)
I think you mean the amlodipine/benicar (azor)  Ok to try change to [facility] gener[employer] - amlodipine 10, and diovan 160 - done per emr

## 2011-06-30 ENCOUNTER — Other Ambulatory Visit: Payer: Self-pay

## 2011-06-30 MED ORDER — TESTOSTERONE CYPIONATE 100 MG/ML IM SOLN: 100.0000 mg | INTRAMUSCULAR | Status: DC

## 2011-06-30 NOTE — Telephone Encounter (Signed)
Faxed hardcopy to pharmacy. 

## 2011-06-30 NOTE — Telephone Encounter (Signed)
Done hardcopy to robin  

## 2011-08-16 ENCOUNTER — Other Ambulatory Visit: Payer: Self-pay

## 2011-08-16 MED ORDER — LABETALOL HCL 300 MG PO TABS: 300.0000 mg | ORAL_TABLET | Freq: Two times a day (BID) | ORAL | Status: DC

## 2011-09-13 ENCOUNTER — Ambulatory Visit (INDEPENDENT_AMBULATORY_CARE_PROVIDER_SITE_OTHER): Payer: 59 | Admitting: Internal Medicine

## 2011-09-13 ENCOUNTER — Encounter: Payer: Self-pay | Admitting: Internal Medicine

## 2011-09-13 VITALS — BP 142/78 | HR 62 | Temp 97.2°F | Ht 72.0 in | Wt 207.5 lb

## 2011-09-13 DIAGNOSIS — J019 Acute sinusitis, unspecified: Secondary | ICD-10-CM | POA: Insufficient documentation

## 2011-09-13 DIAGNOSIS — R42 Dizziness and giddiness: Secondary | ICD-10-CM

## 2011-09-13 DIAGNOSIS — J329 Chronic sinusitis, unspecified: Secondary | ICD-10-CM

## 2011-09-13 DIAGNOSIS — I10 Essential (primary) hypertension: Secondary | ICD-10-CM

## 2011-09-13 MED ORDER — MECLIZINE HCL 12.5 MG PO TABS: 12.5000 mg | ORAL_TABLET | Freq: Three times a day (TID) | ORAL | Status: AC | PRN

## 2011-09-13 MED ORDER — FEXOFENADINE HCL 180 MG PO TABS: 180.0000 mg | ORAL_TABLET | Freq: Every day | ORAL | Status: DC

## 2011-09-13 MED ORDER — LEVOFLOXACIN 250 MG PO TABS: 250.0000 mg | ORAL_TABLET | Freq: Every day | ORAL | Status: AC

## 2011-09-13 MED ORDER — METHYLPREDNISOLONE ACETATE 80 MG/ML IJ SUSP
120.0000 mg | Freq: Once | INTRAMUSCULAR | Status: AC
  Administered 2011-09-13: 120 mg via INTRAMUSCULAR

## 2011-09-13 MED ORDER — FLUTICASONE PROPIONATE 50 MCG/ACT NA SUSP: 2.0000 | Freq: Every day | NASAL | Status: DC

## 2011-09-13 NOTE — Assessment & Plan Note (Signed)
To add the allegra/flonase, refer to ent but pt wants to self-refer

## 2011-09-13 NOTE — Patient Instructions (Addendum)
You had the steroid shot today Take all new medications as prescribed  - the antibiotic, allegra, flonase, and meclizine (for vertigo) Please only take HALF of the azor for 1 week, then resume whole pill after that Continue all other medications as before Please call if you need [place] to refer you to ENT for insurance purposes Your EKG was OK today, and you did not seem to be too dehydrated by standing blood pressures today

## 2011-09-13 NOTE — Progress Notes (Signed)
Subjective:    Patient ID: Grant Bowers, male    DOB: 1945/08/11, 67 y.o.   MRN: [id number]  HPI   Here with 3 days acute onset fever, facial pain, pressure, general weakness and malaise, and greenish d/c, with slight ST, but little to no cough and Pt denies chest pain, increased sob or doe, wheezing, orthopnea, PND, increased LE swelling, palpitations, or syncope, but has recurrent positional vertigo type dizziness lasting intermittently up to 30-60 sec over the last 10 days.  Did play golf 5 days in a row near the start of the illness which may have contributed to unusual fatigue.  Has hx of chronic sinus s/p several surgury in the past, but none for several yrs.  Of note also has had lower BP recently, often check BP daily at home, usually well controlled, but has been lower in the 106/40 range.   Past Medical History  Diagnosis Date  . PSA, INCREASED 04/08/2008  . 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 HYPERTENSION 04/08/2008  . GERD 04/08/2008  . RENAL CALCULUS 02/11/2010   No past surgical history on file.  reports that he has never smoked. He does not have any smokeless tobacco history on file. His alcohol and drug histories not on file. family history is not on file. Allergies  Allergen Reactions  . Atorvastatin    Current Outpatient Prescriptions on File Prior to Visit  Medication Sig Dispense Refill  . amlodipine-olmesartan (AZOR) 10-20 MG per tablet Take 1 tablet by mouth daily.      Marland Kitchen aspirin 81 MG tablet Take 81 mg by mouth daily.        . Coenzyme Q10 (COQ10) 30 MG CAPS Take by mouth daily.        Marland Kitchen labetalol (NORMODYNE) 300 MG tablet Take 1 tablet (300 mg total) by mouth 2 (two) times daily.  180 tablet  3  . Omega-3 Fatty Acids (OMEGA 3 PO) Take by mouth daily.        Marland Kitchen omeprazole (PRILOSEC) 10 MG capsule Take 10 mg by mouth daily.        . simvastatin (ZOCOR) 20 MG tablet TAKE 1 TABLET BY MOUTH EVERY DAY  90 tablet  3  . testosterone cypionate (DEPOTESTOTERONE CYPIONATE) 100  MG/ML injection Inject 1 mL (100 mg total) into the muscle every 28 (twenty-eight) days. once monthly  10 mL  5  . fexofenadine (ALLEGRA) 180 MG tablet Take 1 tablet (180 mg total) by mouth daily.  30 tablet  11  . fluticasone (FLONASE) 50 MCG/ACT nasal spray Place 2 sprays into the nose daily.  16 g  2   No current facility-administered medications on file prior to visit.   Review of Systems Review of Systems  Constitutional: Negative for diaphoresis and unexpected weight change.  HENT: Negative for drooling and tinnitus.   Eyes: Negative for photophobia and visual disturbance.  Respiratory: Negative for choking and stridor.   Gastrointestinal: Negative for vomiting and blood in stool.  Neurological: Negative for tremors and numbness.  Psychiatric/Behavioral: Negative for decreased concentration. The patient is not hyperactive.      Objective:   Physical Exam BP 142/78  Pulse 62  Temp(Src) 97.2 F (36.2 C) (Oral)  Ht 6' (1.829 m)  Wt 207 lb 8 oz (94.121 kg)  BMI 28.14 kg/m2  SpO2 96% Physical Exam  VS noted, mild ill Constitutional: Pt appears well-developed and well-nourished.  HENT: Head: Normocephalic.  Right Ear: External ear normal.  Left Ear: External ear normal.  Bilat tm's mild erythema - left more than right with mild effusion and bulging.  Sinus mild tender bilat max, .  Pharynx mild erythema Eyes: Conjunctivae and EOM are normal. Pupils are equal, round, and reactive to light.  Neck: Normal range of motion. Neck supple.  Cardiovascular: Normal rate and regular rhythm.   Pulmonary/Chest: Effort normal and breath sounds normal.  Abd:  Soft, NT, non-distended, + BS Neurological: Pt is alert. No cranial nerve deficit. motor/dtr/gait intact Skin: Skin is warm. No erythema.  Psychiatric: Pt behavior is normal. Thought content normal. 1+ nervous    Assessment & Plan:

## 2011-09-13 NOTE — Assessment & Plan Note (Signed)
Borderline low, likely related to current illness, for 1/2 azor for 1 wk, then resume full dosing unless BP remains lower, as he check 1-2 times per day

## 2011-09-13 NOTE — Assessment & Plan Note (Addendum)
ECG reviewed as per emr, symptoms likely due to left middle ear involvement by exam, for meclizine prn,  to f/u any worsening symptoms or concerns

## 2011-09-13 NOTE — Assessment & Plan Note (Signed)
Mild to mod, for antibx course,  to f/u any worsening symptoms or concerns 

## 2011-09-14 ENCOUNTER — Telehealth: Payer: Self-pay

## 2011-09-14 DIAGNOSIS — J329 Chronic sinusitis, unspecified: Secondary | ICD-10-CM

## 2011-09-14 NOTE — Telephone Encounter (Signed)
Message copied by Pincus Sanes on Thu Sep 14, 2011 10:13 AM ------      Message from: [name]      Created: Wed Sep 13, 2011  5:10 PM      Regarding: name of ENT       I didn't ask the name of his ENT as he initially said he would make his own appt;  Please ask pt- does he have an ENT preference?            ----- Message -----         From: Vladimir Crofts Jhane Lorio         Sent: 09/13/2011   4:59 PM           To: [name], MD            The patient does want a referral to ENT

## 2011-09-14 NOTE — Telephone Encounter (Signed)
Done per emr 

## 2011-09-14 NOTE — Telephone Encounter (Signed)
Called the patient left message to call back 

## 2011-09-14 NOTE — Telephone Encounter (Signed)
Patient canceled ENT appt. Made today in [place], [place]. Due to him being seen at that office by PA.  The patient would like a referral to Dr. Massie Kluver in [place], [place]. Phone number [phone]

## 2011-09-14 NOTE — Telephone Encounter (Signed)
The patient returned my call and would like to be referred to " [facility]"  (phone number [phone]).

## 2012-03-14 ENCOUNTER — Encounter: Payer: Self-pay | Admitting: Internal Medicine

## 2012-03-14 ENCOUNTER — Other Ambulatory Visit (INDEPENDENT_AMBULATORY_CARE_PROVIDER_SITE_OTHER): Payer: 59

## 2012-03-14 ENCOUNTER — Ambulatory Visit (INDEPENDENT_AMBULATORY_CARE_PROVIDER_SITE_OTHER): Payer: 59 | Admitting: Internal Medicine

## 2012-03-14 VITALS — BP 138/78 | HR 67 | Temp 97.7°F | Ht 72.0 in | Wt 211.4 lb

## 2012-03-14 DIAGNOSIS — Z0000 Encounter for general adult medical examination without abnormal findings: Secondary | ICD-10-CM

## 2012-03-14 DIAGNOSIS — I10 Essential (primary) hypertension: Secondary | ICD-10-CM

## 2012-03-14 DIAGNOSIS — E291 Testicular hypofunction: Secondary | ICD-10-CM

## 2012-03-14 DIAGNOSIS — Z125 Encounter for screening for malignant neoplasm of prostate: Secondary | ICD-10-CM

## 2012-03-14 LAB — BASIC METABOLIC PANEL
BUN: 16 mg/dL (ref 6–23)
CO2: 26 mEq/L (ref 19–32)
Calcium: 9.2 mg/dL (ref 8.4–10.5)
GFR: 73.23 mL/min (ref >60.00)
Glucose, Bld: 128 mg/dL — ABNORMAL HIGH (ref 70–99)
Sodium: 141 mEq/L (ref 135–145)

## 2012-03-14 LAB — URINALYSIS, ROUTINE W REFLEX MICROSCOPIC
Hgb urine dipstick: NEGATIVE
Nitrite: NEGATIVE
Urine Glucose: NEGATIVE
Urobilinogen, UA: 1 (ref 0.0–1.0)

## 2012-03-14 LAB — CBC WITH DIFFERENTIAL/PLATELET
Basophils Absolute: 0 10*3/uL (ref 0.0–0.1)
Eosinophils Relative: 3 % (ref 0.0–5.0)
Hemoglobin: 14.2 g/dL (ref 13.0–17.0)
Lymphocytes Relative: 26.9 % (ref 12.0–46.0)
Monocytes Relative: 10.3 % (ref 3.0–12.0)
Platelets: 199 10*3/uL (ref 150.0–400.0)
RDW: 12.9 % (ref 11.5–14.6)
WBC: 4.8 10*3/uL (ref 4.5–10.5)

## 2012-03-14 LAB — HEPATIC FUNCTION PANEL
AST: 17 U/L (ref 0–37)
Albumin: 3.9 g/dL (ref 3.5–5.2)
Alkaline Phosphatase: 66 U/L (ref 39–117)
Total Protein: 6.5 g/dL (ref 6.0–8.3)

## 2012-03-14 LAB — LIPID PANEL
Cholesterol: 155 mg/dL (ref 0–200)
HDL: 45.2 mg/dL (ref >39.00)
VLDL: 7.4 mg/dL (ref 0.0–40.0)

## 2012-03-14 MED ORDER — AMLODIPINE BESYLATE 10 MG PO TABS: 10.0000 mg | ORAL_TABLET | Freq: Every day | ORAL | Status: DC

## 2012-03-14 MED ORDER — IRBESARTAN 300 MG PO TABS: 300.0000 mg | ORAL_TABLET | Freq: Every day | ORAL | Status: DC

## 2012-03-14 NOTE — Patient Instructions (Addendum)
OK to finish your azor 10/20 per day Then start the amlodipine 10 mg per day, and the generic Avapro 300 mg per day Continue all other medications as before Please have the pharmacy call with any other refills you may need. Please go to LAB in the Basement for the blood and/or urine tests to be done today You will be contacted by phone if any changes need to be made immediately.  Otherwise, you will receive a letter about your results with an explanation, but please check with MyChart first. Thank you for enrolling in MyChart. Please follow the instructions below to securely access your online medical record. MyChart allows you to send messages to your doctor, view your test results, renew your prescriptions, schedule appointments, and more. To Log into MyChart, please go to [email or web address], and your Username is:  [email or web address]

## 2012-03-14 NOTE — Assessment & Plan Note (Signed)

## 2012-03-14 NOTE — Progress Notes (Signed)
Subjective:    Patient ID: Grant Bowers, male    DOB: Dec 20, 1945, 66 y.o.   MRN: [id number]  HPI  Here for wellness and f/u;  Overall doing ok;  Pt denies CP, worsening SOB, DOE, wheezing, orthopnea, PND, worsening LE edema, palpitations, dizziness or syncope.  Pt denies neurological change such as new Headache, facial or extremity weakness.  Pt denies polydipsia, polyuria, or low sugar symptoms. Pt states overall good compliance with treatment and medications, good tolerability, and trying to follow lower cholesterol diet.  Pt denies worsening depressive symptoms, suicidal ideation or panic. No fever, wt loss, night sweats, loss of appetite, or other constitutional symptoms.  Pt states good ability with ADL's, low fall risk, home safety reviewed and adequate, no significant changes in hearing or vision, and occasionally active with exercise.  Azor now too expensive at $45 per month.  No other acute complaints. BP at home in the AM is often SBP up to 170 prior to taking meds Past Medical History  Diagnosis Date  . PSA, INCREASED 04/08/2008  . 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 HYPERTENSION 04/08/2008  . GERD 04/08/2008  . RENAL CALCULUS 02/11/2010   No past surgical history on file.  reports that he has never smoked. He does not have any smokeless tobacco history on file. His alcohol and drug histories not on file. family history is not on file. Allergies  Allergen Reactions  . Atorvastatin    Current Outpatient Prescriptions on File Prior to Visit  Medication Sig Dispense Refill  . aspirin 81 MG tablet Take 81 mg by mouth daily.        . Coenzyme Q10 (COQ10) 30 MG CAPS Take by mouth daily.        . fexofenadine (ALLEGRA) 180 MG tablet Take 1 tablet (180 mg total) by mouth daily.  30 tablet  11  . fluticasone (FLONASE) 50 MCG/ACT nasal spray Place 2 sprays into the nose daily.  16 g  2  . labetalol (NORMODYNE) 300 MG tablet Take 1 tablet (300 mg total) by mouth 2 (two) times daily.  180  tablet  3  . Omega-3 Fatty Acids (OMEGA 3 PO) Take by mouth daily.        Marland Kitchen omeprazole (PRILOSEC) 10 MG capsule Take 10 mg by mouth daily.        . simvastatin (ZOCOR) 20 MG tablet TAKE 1 TABLET BY MOUTH EVERY DAY  90 tablet  3  . testosterone cypionate (DEPOTESTOTERONE CYPIONATE) 100 MG/ML injection Inject 1 mL (100 mg total) into the muscle every 28 (twenty-eight) days. once monthly  10 mL  5  . irbesartan (AVAPRO) 300 MG tablet Take 1 tablet (300 mg total) by mouth at bedtime.  90 tablet  3   Review of Systems Review of Systems  Constitutional: Negative for diaphoresis, activity change, appetite change and unexpected weight change.  HENT: Negative for hearing loss, ear pain, facial swelling, mouth sores and neck stiffness.   Eyes: Negative for pain, redness and visual disturbance.  Respiratory: Negative for shortness of breath and wheezing.   Cardiovascular: Negative for chest pain and palpitations.  Gastrointestinal: Negative for diarrhea, blood in stool, abdominal distention and rectal pain.  Genitourinary: Negative for hematuria, flank pain and decreased urine volume.  Musculoskeletal: Negative for myalgias and joint swelling.  Skin: Negative for color change and wound.  Neurological: Negative for syncope and numbness.  Hematological: Negative for adenopathy.  Psychiatric/Behavioral: Negative for hallucinations, self-injury, decreased concentration and agitation.  Objective:   Physical Exam BP 138/78  Pulse 67  Temp 97.7 F (36.5 C) (Oral)  Ht 6' (1.829 m)  Wt 211 lb 6 oz (95.879 kg)  BMI 28.67 kg/m2  SpO2 95% Physical Exam  VS noted Constitutional: Pt is oriented to person, place, and time. Appears well-developed and well-nourished.  Head: Normocephalic and atraumatic.  Right Ear: External ear normal.  Left Ear: External ear normal.  Nose: Nose normal.  Mouth/Throat: Oropharynx is clear and moist.  Eyes: Conjunctivae and EOM are normal. Pupils are equal, round,  and reactive to light.  Neck: Normal range of motion. Neck supple. No JVD present. No tracheal deviation present.  Cardiovascular: Normal rate, regular rhythm, normal heart sounds and intact distal pulses.   Pulmonary/Chest: Effort normal and breath sounds normal.  Abdominal: Soft. Bowel sounds are normal. There is no tenderness.  Musculoskeletal: Normal range of motion. Exhibits no edema.  Lymphadenopathy:  Has no cervical adenopathy.  Neurological: Pt is alert and oriented to person, place, and time. Pt has normal reflexes. No cranial nerve deficit.  Skin: Skin is warm and dry. No rash noted. No suspicious lesions.   Psychiatric:  Has  normal mood and affect. Behavior is normal.     Assessment & Plan:

## 2012-03-14 NOTE — Assessment & Plan Note (Signed)
Ok to change the azor to 2 generic meds - amlod 10 , and avapro 300,  to f/u any worsening symptoms or concerns, cont to monitor BP at home

## 2012-03-15 ENCOUNTER — Ambulatory Visit: Payer: 59

## 2012-03-15 DIAGNOSIS — R7309 Other abnormal glucose: Secondary | ICD-10-CM

## 2012-03-15 LAB — HEMOGLOBIN A1C: Hgb A1c MFr Bld: 6.1 % (ref 4.6–6.5)

## 2012-03-15 LAB — TESTOSTERONE, FREE, TOTAL, SHBG
Testosterone, Free: 34 pg/mL — ABNORMAL LOW (ref 47.0–244.0)
Testosterone-% Free: 2 % (ref 1.6–2.9)

## 2012-05-25 ENCOUNTER — Other Ambulatory Visit: Payer: Self-pay

## 2012-06-04 ENCOUNTER — Other Ambulatory Visit: Payer: Self-pay | Admitting: Internal Medicine

## 2012-07-04 ENCOUNTER — Other Ambulatory Visit: Payer: Self-pay

## 2012-07-04 MED ORDER — AMLODIPINE BESYLATE 10 MG PO TABS: 10.0000 mg | ORAL_TABLET | Freq: Every day | ORAL | Status: DC

## 2012-07-04 MED ORDER — SIMVASTATIN 20 MG PO TABS: 20.0000 mg | ORAL_TABLET | Freq: Every day | ORAL | Status: DC

## 2012-07-04 MED ORDER — LABETALOL HCL 300 MG PO TABS: 300.0000 mg | ORAL_TABLET | Freq: Two times a day (BID) | ORAL | Status: DC

## 2012-07-05 ENCOUNTER — Other Ambulatory Visit: Payer: Self-pay

## 2012-07-05 DIAGNOSIS — J329 Chronic sinusitis, unspecified: Secondary | ICD-10-CM

## 2012-07-05 MED ORDER — FLUTICASONE PROPIONATE 50 MCG/ACT NA SUSP: 2.0000 | Freq: Every day | NASAL | Status: DC

## 2012-07-05 MED ORDER — IRBESARTAN 300 MG PO TABS: 300.0000 mg | ORAL_TABLET | Freq: Every day | ORAL | Status: DC

## 2012-07-05 MED ORDER — TESTOSTERONE CYPIONATE 100 MG/ML IM SOLN: 100.0000 mg | INTRAMUSCULAR | Status: DC

## 2012-07-05 NOTE — Telephone Encounter (Signed)
Done hardcopy to robin  

## 2012-07-05 NOTE — Telephone Encounter (Signed)
Faxed to pharmacy

## 2012-09-23 ENCOUNTER — Telehealth: Payer: Self-pay

## 2012-09-23 DIAGNOSIS — N281 Cyst of kidney, acquired: Secondary | ICD-10-CM

## 2012-09-23 NOTE — Telephone Encounter (Signed)
Patient informed referral request done.

## 2012-09-23 NOTE — Telephone Encounter (Signed)
Referral done

## 2012-09-23 NOTE — Telephone Encounter (Signed)
The patient was seen in the ER in [place] for Kidney Stones.  A CT was done and a cyst on his kidney was found.  He is needing a referral to [facility] in [place] phone number [phone] by his PCP.  Call back number is [phone]

## 2012-09-25 ENCOUNTER — Other Ambulatory Visit: Payer: Self-pay | Admitting: Internal Medicine

## 2012-09-25 DIAGNOSIS — N281 Cyst of kidney, acquired: Secondary | ICD-10-CM

## 2012-11-13 ENCOUNTER — Other Ambulatory Visit: Payer: Self-pay

## 2013-02-13 ENCOUNTER — Other Ambulatory Visit: Payer: Self-pay

## 2013-03-20 ENCOUNTER — Encounter: Payer: 59 | Admitting: Internal Medicine

## 2013-03-20 DIAGNOSIS — Z0289 Encounter for other administrative examinations: Secondary | ICD-10-CM

## 2013-07-01 ENCOUNTER — Other Ambulatory Visit: Payer: Self-pay | Admitting: *Deleted

## 2013-07-01 DIAGNOSIS — J329 Chronic sinusitis, unspecified: Secondary | ICD-10-CM

## 2013-07-01 MED ORDER — LABETALOL HCL 300 MG PO TABS: 300.0000 mg | ORAL_TABLET | Freq: Two times a day (BID) | ORAL | Status: DC

## 2013-07-01 MED ORDER — FLUTICASONE PROPIONATE 50 MCG/ACT NA SUSP: 2.0000 | Freq: Every day | NASAL | Status: DC

## 2013-07-01 MED ORDER — IRBESARTAN 300 MG PO TABS: 300.0000 mg | ORAL_TABLET | Freq: Every day | ORAL | Status: DC

## 2013-07-01 MED ORDER — AMLODIPINE BESYLATE 10 MG PO TABS: 10.0000 mg | ORAL_TABLET | Freq: Every day | ORAL | Status: DC

## 2013-07-01 MED ORDER — SIMVASTATIN 20 MG PO TABS: 20.0000 mg | ORAL_TABLET | Freq: Every day | ORAL | Status: DC

## 2013-09-02 ENCOUNTER — Encounter: Payer: Self-pay | Admitting: Internal Medicine

## 2013-09-02 ENCOUNTER — Ambulatory Visit (INDEPENDENT_AMBULATORY_CARE_PROVIDER_SITE_OTHER): Payer: Medicare PPO

## 2013-09-02 ENCOUNTER — Ambulatory Visit (INDEPENDENT_AMBULATORY_CARE_PROVIDER_SITE_OTHER): Payer: Medicare FFS | Admitting: Internal Medicine

## 2013-09-02 VITALS — BP 142/70 | HR 69 | Temp 98.0°F | Ht 72.0 in | Wt 207.0 lb

## 2013-09-02 DIAGNOSIS — Z0000 Encounter for general adult medical examination without abnormal findings: Secondary | ICD-10-CM | POA: Diagnosis not present

## 2013-09-02 DIAGNOSIS — Z23 Encounter for immunization: Secondary | ICD-10-CM

## 2013-09-02 DIAGNOSIS — Z136 Encounter for screening for cardiovascular disorders: Secondary | ICD-10-CM | POA: Diagnosis not present

## 2013-09-02 LAB — CBC WITH DIFFERENTIAL/PLATELET
BASOS ABS: 0 10*3/uL (ref 0.0–0.1)
Basophils Relative: 0.3 % (ref 0.0–3.0)
EOS ABS: 0.2 10*3/uL (ref 0.0–0.7)
Eosinophils Relative: 3.9 % (ref 0.0–5.0)
HEMATOCRIT: 42.5 % (ref 39.0–52.0)
HEMOGLOBIN: 14.3 g/dL (ref 13.0–17.0)
Lymphocytes Relative: 23.7 % (ref 12.0–46.0)
Lymphs Abs: 1.5 10*3/uL (ref 0.7–4.0)
MCHC: 33.7 g/dL (ref 30.0–36.0)
MCV: 86.8 fl (ref 78.0–100.0)
MONO ABS: 0.5 10*3/uL (ref 0.1–1.0)
MONOS PCT: 8.5 % (ref 3.0–12.0)
NEUTROS ABS: 3.9 10*3/uL (ref 1.4–7.7)
Neutrophils Relative %: 63.6 % (ref 43.0–77.0)
PLATELETS: 227 10*3/uL (ref 150.0–400.0)
RBC: 4.9 Mil/uL (ref 4.22–5.81)
RDW: 13.4 % (ref 11.5–15.5)
WBC: 6.2 10*3/uL (ref 4.0–10.5)

## 2013-09-02 LAB — URINALYSIS, ROUTINE W REFLEX MICROSCOPIC
BILIRUBIN URINE: NEGATIVE
Hgb urine dipstick: NEGATIVE
Ketones, ur: NEGATIVE
LEUKOCYTES UA: NEGATIVE
Nitrite: NEGATIVE
Specific Gravity, Urine: >=1.03 — AB (ref 1.000–1.030)
Total Protein, Urine: 30 — AB
UROBILINOGEN UA: 0.2 (ref 0.0–1.0)
Urine Glucose: NEGATIVE
pH: 6 (ref 5.0–8.0)

## 2013-09-02 LAB — BASIC METABOLIC PANEL
BUN: 14 mg/dL (ref 6–23)
CO2: 27 mEq/L (ref 19–32)
Calcium: 9.4 mg/dL (ref 8.4–10.5)
Chloride: 108 mEq/L (ref 96–112)
Creatinine, Ser: 1 mg/dL (ref 0.4–1.5)
GFR: 81.65 mL/min (ref >60.00)
Glucose, Bld: 112 mg/dL — ABNORMAL HIGH (ref 70–99)
Potassium: 3.9 mEq/L (ref 3.5–5.1)
SODIUM: 142 meq/L (ref 135–145)

## 2013-09-02 LAB — LIPID PANEL
CHOL/HDL RATIO: 3
Cholesterol: 149 mg/dL (ref 0–200)
HDL: 43.4 mg/dL (ref >39.00)
LDL Cholesterol: 98 mg/dL (ref 0–99)
Triglycerides: 39 mg/dL (ref 0.0–149.0)
VLDL: 7.8 mg/dL (ref 0.0–40.0)

## 2013-09-02 LAB — PSA: PSA: 4.41 ng/mL — ABNORMAL HIGH (ref 0.10–4.00)

## 2013-09-02 LAB — HEPATIC FUNCTION PANEL
ALBUMIN: 4 g/dL (ref 3.5–5.2)
ALK PHOS: 77 U/L (ref 39–117)
ALT: 20 U/L (ref 0–53)
AST: 21 U/L (ref 0–37)
BILIRUBIN DIRECT: 0.2 mg/dL (ref 0.0–0.3)
Total Bilirubin: 1 mg/dL (ref 0.2–1.2)
Total Protein: 6.8 g/dL (ref 6.0–8.3)

## 2013-09-02 LAB — TSH: TSH: 1.38 u[IU]/mL (ref 0.35–4.50)

## 2013-09-02 MED ORDER — SIMVASTATIN 20 MG PO TABS: 20.0000 mg | ORAL_TABLET | Freq: Every day | ORAL | Status: DC

## 2013-09-02 MED ORDER — AMLODIPINE BESYLATE 10 MG PO TABS: 10.0000 mg | ORAL_TABLET | Freq: Every day | ORAL | Status: DC

## 2013-09-02 MED ORDER — IRBESARTAN 300 MG PO TABS
300.0000 mg | ORAL_TABLET | Freq: Every day | ORAL | Status: DC
Start: 2013-09-02 — End: 2014-09-18

## 2013-09-02 MED ORDER — LABETALOL HCL 300 MG PO TABS: 300.0000 mg | ORAL_TABLET | Freq: Two times a day (BID) | ORAL | Status: DC

## 2013-09-02 MED ORDER — OMEPRAZOLE 10 MG PO CPDR: 10.0000 mg | DELAYED_RELEASE_CAPSULE | Freq: Every day | ORAL | Status: DC

## 2013-09-02 NOTE — Progress Notes (Signed)
Pre visit review using our clinic review tool, if applicable. No additional management support is needed unless otherwise documented below in the visit note. 

## 2013-09-02 NOTE — Patient Instructions (Addendum)
You had the new Prevnar pneumonia shot today  Please continue all other medications as before, and refills have been done if requested.  Please have the pharmacy call with any other refills you may need.  Please continue your efforts at being more active, low cholesterol diet, and weight control.  You are otherwise up to date with prevention measures today.  Please go to the LAB in the Basement (turn left off the elevator) for the tests to be done today  You will be contacted by phone if any changes need to be made immediately.  Otherwise, you will receive a letter about your results with an explanation, but please check with MyChart first.  Please keep your appointments with your specialists as you may have planned  Please remember to sign up for MyChart if you have not done so, as this will be important to you in the future with finding out test results, communicating by private email, and scheduling acute appointments online when needed.  Please return in 1 year for your yearly visit, or sooner if needed, with Lab testing done 3-5 days before

## 2013-09-02 NOTE — Assessment & Plan Note (Signed)

## 2013-09-02 NOTE — Progress Notes (Signed)
Subjective:    Patient ID: Grant Bowers, male    DOB: Jun 02, 1945, 68 y.o.   MRN: [id number]  HPI  Here for wellness and f/u;  Overall doing ok;  Pt denies CP, worsening SOB, DOE, wheezing, orthopnea, PND, worsening LE edema, palpitations, dizziness or syncope.  Pt denies neurological change such as new headache, facial or extremity weakness.  Pt denies polydipsia, polyuria, or low sugar symptoms. Pt states overall good compliance with treatment and medications, good tolerability, and has been trying to follow lower cholesterol diet.  Pt denies worsening depressive symptoms, suicidal ideation or panic. No fever, night sweats, wt loss, loss of appetite, or other constitutional symptoms.  Pt states good ability with ADL's, has low fall risk, home safety reviewed and adequate, no other significant changes in hearing or vision, and only occasionally active with exercise. No curren complaints Past Medical History  Diagnosis Date  . PSA, INCREASED 04/08/2008  . 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 HYPERTENSION 04/08/2008  . GERD 04/08/2008  . RENAL CALCULUS 02/11/2010   No past surgical history on file.  reports that he has never smoked. He does not have any smokeless tobacco history on file. His alcohol and drug histories are not on file. family history is not on file. Allergies  Allergen Reactions  . Atorvastatin    Current Outpatient Prescriptions on File Prior to Visit  Medication Sig Dispense Refill  . amLODipine (NORVASC) 10 MG tablet Take 1 tablet (10 mg total) by mouth daily.  90 tablet  0  . aspirin 81 MG tablet Take 81 mg by mouth daily.        . fluticasone (FLONASE) 50 MCG/ACT nasal spray Place 2 sprays into both nostrils daily.  48 g  0  . irbesartan (AVAPRO) 300 MG tablet Take 1 tablet (300 mg total) by mouth at bedtime.  90 tablet  0  . labetalol (NORMODYNE) 300 MG tablet Take 1 tablet (300 mg total) by mouth 2 (two) times daily.  180 tablet  0  . testosterone cypionate  (DEPOTESTOTERONE CYPIONATE) 100 MG/ML injection Inject 1 mL (100 mg total) into the muscle every 28 (twenty-eight) days. 1ML once monthly  10 mL  5  . fexofenadine (ALLEGRA) 180 MG tablet Take 1 tablet (180 mg total) by mouth daily.  30 tablet  11   No current facility-administered medications on file prior to visit.   Review of Systems Constitutional: Negative for increased diaphoresis, other activity, appetite or other siginficant weight change  HENT: Negative for worsening hearing loss, ear pain, facial swelling, mouth sores and neck stiffness.   Eyes: Negative for other worsening pain, redness or visual disturbance.  Respiratory: Negative for shortness of breath and wheezing.   Cardiovascular: Negative for chest pain and palpitations.  Gastrointestinal: Negative for diarrhea, blood in stool, abdominal distention or other pain Genitourinary: Negative for hematuria, flank pain or change in urine volume.  Musculoskeletal: Negative for myalgias or other joint complaints.  Skin: Negative for color change and wound.  Neurological: Negative for syncope and numbness. other than noted Hematological: Negative for adenopathy. or other swelling Psychiatric/Behavioral: Negative for hallucinations, self-injury, decreased concentration or other worsening agitation.      Objective:   Physical Exam BP 142/70  Pulse 69  Temp(Src) 98 F (36.7 C) (Oral)  Ht 6' (1.829 m)  Wt 207 lb (93.895 kg)  BMI 28.07 kg/m2  SpO2 95% VS noted,  Constitutional: Pt is oriented to person, place, and time. Appears well-developed and well-nourished.  Head: Normocephalic  and atraumatic.  Right Ear: External ear normal.  Left Ear: External ear normal.  Nose: Nose normal.  Mouth/Throat: Oropharynx is clear and moist.  Eyes: Conjunctivae and EOM are normal. Pupils are equal, round, and reactive to light.  Neck: Normal range of motion. Neck supple. No JVD present. No tracheal deviation present.  Cardiovascular: Normal  rate, regular rhythm, normal heart sounds and intact distal pulses.   Pulmonary/Chest: Effort normal and breath sounds without rales or wheezing  Abdominal: Soft. Bowel sounds are normal. NT. No HSM  Musculoskeletal: Normal range of motion. Exhibits no edema.  Lymphadenopathy:  Has no cervical adenopathy.  Neurological: Pt is alert and oriented to person, place, and time. Pt has normal reflexes. No cranial nerve deficit. Motor grossly intact Skin: Skin is warm and dry. No rash noted.  Psychiatric:  Has normal mood and affect. Behavior is normal.     Assessment & Plan:

## 2013-09-02 NOTE — Addendum Note (Signed)
Addended by: Sharon Seller B on: 09/02/2013 04:10 PM   Modules accepted: Orders

## 2013-09-04 ENCOUNTER — Ambulatory Visit: Payer: Commercial Managed Care - HMO

## 2013-09-04 DIAGNOSIS — R7309 Other abnormal glucose: Secondary | ICD-10-CM

## 2013-09-04 LAB — HEMOGLOBIN A1C: Hgb A1c MFr Bld: 6.2 % (ref 4.6–6.5)

## 2014-03-24 ENCOUNTER — Telehealth: Payer: Self-pay | Admitting: Internal Medicine

## 2014-03-24 NOTE — Telephone Encounter (Signed)
Pt requesting generic for Viagra for Sildenafil 20 mg. Pls send to [place]

## 2014-03-25 MED ORDER — SILDENAFIL CITRATE 20 MG PO TABS: ORAL_TABLET | ORAL | Status: DC

## 2014-03-25 NOTE — Telephone Encounter (Signed)
Done erx 

## 2014-05-26 ENCOUNTER — Other Ambulatory Visit: Payer: Self-pay | Admitting: Internal Medicine

## 2014-09-09 ENCOUNTER — Telehealth: Payer: Self-pay

## 2014-09-09 NOTE — Telephone Encounter (Signed)
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 Grant Bowers know that he needs to come in at 2;45 prior to his 3:30 apt with Dr. Jenny Reichmann. She will let him know that he will be here to review medicare screens prior to Dr Gwynn Burly apt.

## 2014-09-10 ENCOUNTER — Encounter: Payer: Commercial Managed Care - HMO | Admitting: Internal Medicine

## 2014-09-17 ENCOUNTER — Ambulatory Visit (INDEPENDENT_AMBULATORY_CARE_PROVIDER_SITE_OTHER): Payer: Medicare PPO | Admitting: Internal Medicine

## 2014-09-17 ENCOUNTER — Other Ambulatory Visit: Payer: Self-pay | Admitting: Internal Medicine

## 2014-09-17 ENCOUNTER — Other Ambulatory Visit (INDEPENDENT_AMBULATORY_CARE_PROVIDER_SITE_OTHER): Payer: Medicare PPO

## 2014-09-17 ENCOUNTER — Encounter: Payer: Self-pay | Admitting: Internal Medicine

## 2014-09-17 VITALS — BP 110/60 | HR 70 | Temp 97.9°F | Ht 72.0 in | Wt 206.5 lb

## 2014-09-17 DIAGNOSIS — Z0000 Encounter for general adult medical examination without abnormal findings: Secondary | ICD-10-CM | POA: Diagnosis not present

## 2014-09-17 DIAGNOSIS — R799 Abnormal finding of blood chemistry, unspecified: Secondary | ICD-10-CM

## 2014-09-17 DIAGNOSIS — Z23 Encounter for immunization: Secondary | ICD-10-CM

## 2014-09-17 DIAGNOSIS — I10 Essential (primary) hypertension: Secondary | ICD-10-CM | POA: Diagnosis not present

## 2014-09-17 DIAGNOSIS — R972 Elevated prostate specific antigen [PSA]: Secondary | ICD-10-CM

## 2014-09-17 DIAGNOSIS — R0602 Shortness of breath: Secondary | ICD-10-CM | POA: Diagnosis not present

## 2014-09-17 LAB — BASIC METABOLIC PANEL
BUN: 13 mg/dL (ref 6–23)
CHLORIDE: 104 meq/L (ref 96–112)
CO2: 28 mEq/L (ref 19–32)
CREATININE: 1.09 mg/dL (ref 0.40–1.50)
Calcium: 9.6 mg/dL (ref 8.4–10.5)
GFR: 71.15 mL/min (ref >60.00)
Glucose, Bld: 115 mg/dL — ABNORMAL HIGH (ref 70–99)
Potassium: 4.2 mEq/L (ref 3.5–5.1)
Sodium: 138 mEq/L (ref 135–145)

## 2014-09-17 LAB — URINALYSIS, ROUTINE W REFLEX MICROSCOPIC
BILIRUBIN URINE: NEGATIVE
HGB URINE DIPSTICK: NEGATIVE
Ketones, ur: NEGATIVE
LEUKOCYTES UA: NEGATIVE
Nitrite: NEGATIVE
PH: 6 (ref 5.0–8.0)
RBC / HPF: NONE SEEN (ref 0–?)
SPECIFIC GRAVITY, URINE: 1.015 (ref 1.000–1.030)
Urine Glucose: NEGATIVE
Urobilinogen, UA: 0.2 (ref 0.0–1.0)

## 2014-09-17 LAB — CBC WITH DIFFERENTIAL/PLATELET
BASOS ABS: 0 10*3/uL (ref 0.0–0.1)
BASOS PCT: 0.3 % (ref 0.0–3.0)
EOS ABS: 0.1 10*3/uL (ref 0.0–0.7)
Eosinophils Relative: 2.2 % (ref 0.0–5.0)
HCT: 45.8 % (ref 39.0–52.0)
Hemoglobin: 15.5 g/dL (ref 13.0–17.0)
LYMPHS ABS: 1.4 10*3/uL (ref 0.7–4.0)
LYMPHS PCT: 23.2 % (ref 12.0–46.0)
MCHC: 33.8 g/dL (ref 30.0–36.0)
MCV: 86.1 fl (ref 78.0–100.0)
MONOS PCT: 7.1 % (ref 3.0–12.0)
Monocytes Absolute: 0.4 10*3/uL (ref 0.1–1.0)
NEUTROS PCT: 67.2 % (ref 43.0–77.0)
Neutro Abs: 4.2 10*3/uL (ref 1.4–7.7)
Platelets: 238 10*3/uL (ref 150.0–400.0)
RBC: 5.31 Mil/uL (ref 4.22–5.81)
RDW: 13.4 % (ref 11.5–15.5)
WBC: 6.2 10*3/uL (ref 4.0–10.5)

## 2014-09-17 LAB — HEPATIC FUNCTION PANEL
ALT: 15 U/L (ref 0–53)
AST: 17 U/L (ref 0–37)
Albumin: 4.7 g/dL (ref 3.5–5.2)
Alkaline Phosphatase: 89 U/L (ref 39–117)
BILIRUBIN DIRECT: 0.2 mg/dL (ref 0.0–0.3)
BILIRUBIN TOTAL: 0.9 mg/dL (ref 0.2–1.2)
TOTAL PROTEIN: 6.7 g/dL (ref 6.0–8.3)

## 2014-09-17 LAB — PSA: PSA: 5.15 ng/mL — AB (ref 0.10–4.00)

## 2014-09-17 LAB — LIPID PANEL
Cholesterol: 141 mg/dL (ref 0–200)
HDL: 39.2 mg/dL (ref >39.00)
LDL Cholesterol: 91 mg/dL (ref 0–99)
NONHDL: 101.8
Total CHOL/HDL Ratio: 4
Triglycerides: 56 mg/dL (ref 0.0–149.0)
VLDL: 11.2 mg/dL (ref 0.0–40.0)

## 2014-09-17 LAB — HEMOGLOBIN A1C: Hgb A1c MFr Bld: 5.7 % (ref 4.6–6.5)

## 2014-09-17 LAB — TSH: TSH: 1.24 u[IU]/mL (ref 0.35–4.50)

## 2014-09-17 NOTE — Progress Notes (Signed)
Subjective:   Grant Bowers is a 69 y.o. male who presents for Medicare Annual/Subsequent preventive examination.  Review of Systems:     HRA assessment completed during visit;  Patient is here for Annual Wellness Assessment /   The patient describes their health better, the same or worse than last year? good Reviewed: BMI: 27.9 Discussed metabolic syndrome/  Diet; do not eat breakfast; large dinner; snack at lunch Exercise; golf x 4 every week Still works from [employer] occasionally Family Hx ; father and brother had dm  Psychosocial support; safe community; firearm safety; smoke alarms;   Discussed Goal to improve health based on risk   Screenings; Colonoscopy - completed 2009 EKG - 09/02/13 Prevnar due  Vision: 4 to 5 years; recommended vision check/ has had some  blurred vision; or small spot in vision that affects him on occasion, one time per month  Hearing: 4000 hz both ears Dental: No problems with dental  Gave information on safety to take home;   Current Care Team reviewed and updated  Dr. Kathie Rhodes;       Objective:    Vitals: BP 110/60 mmHg  Pulse 70  Temp(Src) 97.9 F (36.6 C) (Oral)  Ht 6' (1.829 m)  Wt 206 lb 8 oz (93.668 kg)  BMI 28.00 kg/m2  SpO2 97%  Tobacco History  Smoking status  . Never Smoker   Smokeless tobacco  . Not on file     Counseling given: Yes   Past Medical History  Diagnosis Date  . PSA, INCREASED 04/08/2008  . 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 HYPERTENSION 04/08/2008  . GERD 04/08/2008  . RENAL CALCULUS 02/11/2010   No past surgical history on file. No family history on file. History  Sexual Activity  . Sexual Activity: Not on file    Outpatient Encounter Prescriptions as of 09/17/2014  Medication Sig  . amLODipine (NORVASC) 10 MG tablet Take 1 tablet (10 mg total) by mouth daily.  Marland Kitchen aspirin 81 MG tablet Take 81 mg by mouth daily.    . fluticasone (FLONASE) 50 MCG/ACT nasal spray  Place 2 sprays into both nostrils daily.  . irbesartan (AVAPRO) 300 MG tablet Take 1 tablet (300 mg total) by mouth at bedtime.  Marland Kitchen labetalol (NORMODYNE) 300 MG tablet Take 1 tablet (300 mg total) by mouth 2 (two) times daily.  Marland Kitchen omeprazole (PRILOSEC) 10 MG capsule Take 1 capsule (10 mg total) by mouth daily.  . sildenafil (REVATIO) 20 MG tablet 3-5 tabs by mouth every other day as needed  . simvastatin (ZOCOR) 20 MG tablet Take 1 tablet (20 mg total) by mouth daily.  Marland Kitchen testosterone cypionate (DEPOTESTOTERONE CYPIONATE) 100 MG/ML injection Inject 1 mL (100 mg total) into the muscle every 28 (twenty-eight) days. 1ML once monthly  . fexofenadine (ALLEGRA) 180 MG tablet Take 1 tablet (180 mg total) by mouth daily.   No facility-administered encounter medications on file as of 09/17/2014.    Activities of Daily Living In your present state of health, do you have any difficulty performing the following activities: 09/17/2014  Hearing? N  Vision? N  Difficulty concentrating or making decisions? N  Walking or climbing stairs? N  Dressing or bathing? N  Doing errands, shopping? N    Patient Care Team: Biagio Borg, MD as PCP - General   Assessment:    Objective:   Personalized Education given regarding:   Pt determined a personalized goal will exercise a little more  Assessment included: Taking  meds without issues; no barriers identified Stress: Recommendations for managing stress if assessed as a factor;  No Risk for hepatitis or high risk social behavior identified via hepatitis screen Educated on shingles and follow up with insurance company for co-pays or charges applied to Part D benefit. Safety issues reviewed Cognition assessed by AD8; deferred due to no memory issues MMSE  No issues at present assessed today;     Need for Immunizations or other screenings identified;  (CDC recommmend Prevnar at 65 followed by pnuemovax 23 in one year or 5 years after the last dose.  Other  screenings reviewed; Vaccine status/ will take pvs 23 today Colonoscopy not due Eye exam; will schedule Hearing no issue Dental; to be scheduled     Exercise Activities and Dietary recommendations    Goals    . Exercise 3x per week (30 min per time)     States he has [place]; Thinking about joining [employer] sne[employer] Basketball x 1 per week;   Agrees to do something (basketball or walk) one hour x 2 week      Fall Risk Fall Risk  09/17/2014 09/02/2013  Falls in the past year? No No   Depression Screen PHQ 2/9 Scores 09/17/2014 09/02/2013  PHQ - 2 Score 0 0    Cognitive Testing No flowsheet data found.  Immunization History  Administered Date(s) Administered  . Influenza Split 03/13/2011  . Influenza Whole 02/11/2008, 01/08/2009, 02/11/2010  . Pneumococcal Conjugate-13 09/02/2013  . Td 04/10/2000, 01/09/2007  . Zoster 04/08/2008   Screening Tests Health Maintenance  Topic Date Due  . PNA vac Low Risk Adult (2 of 2 - PPSV23) 09/03/2014  . INFLUENZA VACCINE  11/09/2014  . TETANUS/TDAP  01/08/2017  . COLONOSCOPY  04/10/2017  . ZOSTAVAX  Completed      Plan:     Plan   The patient agrees to: Have eyes checked   Advanced directive: refused today  Will discuss with Dr. Jenny Reichmann, episode of dizziness and eye issues    During the course of the visit the patient was educated and counseled about the following appropriate screening and preventive services:   Vaccines to include Pneumoccal, Influenza, Hepatitis B, Td, Zostavax, HCV/ will take PVC 23 today  Electrocardiogram  Cardiovascular Disease; reviewed risk   Colorectal cancer screening - completed  Diabetes screening - discussed pre-diabetes  Prostate Cancer Screening-deferred  Glaucoma screening to have eye exam  Nutrition counseling  Patient Instructions (the written plan) was given to the patient.    Wynetta Fines, RN  09/17/2014

## 2014-09-17 NOTE — Patient Instructions (Signed)
You had the Pneumovax pneumonia shot today  Please continue all other medications as before, and refills have been done if requested.  Please have the pharmacy call with any other refills you may need.  Please continue your efforts at being more active, low cholesterol diet, and weight control.  You are otherwise up to date with prevention measures today.  Please keep your appointments with your specialists as you may have planned  Please go to the LAB in the Basement (turn left off the elevator) for the tests to be done today  You will be contacted by phone if any changes need to be made immediately.  Otherwise, you will receive a letter about your results with an explanation, but please check with MyChart first.  Please remember to sign up for MyChart if you have not done so, as this will be important to you in the future with finding out test results, communicating by private email, and scheduling acute appointments online when needed.  Please return in 1 year for your yearly visit, or sooner if needed, with Lab testing done 3-5 days before  

## 2014-09-18 ENCOUNTER — Telehealth: Payer: Self-pay | Admitting: Internal Medicine

## 2014-09-18 DIAGNOSIS — R799 Abnormal finding of blood chemistry, unspecified: Secondary | ICD-10-CM | POA: Insufficient documentation

## 2014-09-18 DIAGNOSIS — J328 Other chronic sinusitis: Secondary | ICD-10-CM

## 2014-09-18 LAB — PRO B NATRIURETIC PEPTIDE: PRO B NATRI PEPTIDE: 184.4 pg/mL — AB (ref <126)

## 2014-09-18 MED ORDER — SIMVASTATIN 20 MG PO TABS: 20.0000 mg | ORAL_TABLET | Freq: Every day | ORAL | Status: DC

## 2014-09-18 MED ORDER — FEXOFENADINE HCL 180 MG PO TABS: 180.0000 mg | ORAL_TABLET | Freq: Every day | ORAL | Status: DC

## 2014-09-18 MED ORDER — IRBESARTAN 300 MG PO TABS: 300.0000 mg | ORAL_TABLET | Freq: Every day | ORAL | Status: DC

## 2014-09-18 MED ORDER — LABETALOL HCL 300 MG PO TABS: 300.0000 mg | ORAL_TABLET | Freq: Two times a day (BID) | ORAL | Status: DC

## 2014-09-18 MED ORDER — OMEPRAZOLE 10 MG PO CPDR: 10.0000 mg | DELAYED_RELEASE_CAPSULE | Freq: Every day | ORAL | Status: DC

## 2014-09-18 MED ORDER — FLUTICASONE PROPIONATE 50 MCG/ACT NA SUSP: 2.0000 | Freq: Every day | NASAL | Status: DC

## 2014-09-18 MED ORDER — AMLODIPINE BESYLATE 10 MG PO TABS: 10.0000 mg | ORAL_TABLET | Freq: Every day | ORAL | Status: DC

## 2014-09-18 NOTE — Assessment & Plan Note (Signed)
For f/u psa, consider urology referral

## 2014-09-18 NOTE — Assessment & Plan Note (Signed)
Also asks for f/u BNP and a1c due to recent [facility] screening slight abnormal

## 2014-09-18 NOTE — Assessment & Plan Note (Signed)

## 2014-09-18 NOTE — Telephone Encounter (Signed)
Patient called back.  I gave MD response on labs.  Patient is requesting for all of his meds to be refilled.  Patient uses [facility] order.

## 2014-09-18 NOTE — Progress Notes (Signed)
Subjective:    Patient ID: Grant Bowers, male    DOB: 05/29/1945, 69 y.o.   MRN: [id number]  HPI  Here for wellness and f/u;  Overall doing ok;  Pt denies Chest pain, worsening SOB, DOE, wheezing, orthopnea, PND, worsening LE edema, palpitations, dizziness or syncope.  Pt denies neurological change such as new headache, facial or extremity weakness.  Pt denies polydipsia, polyuria, or low sugar symptoms. Pt states overall good compliance with treatment and medications, good tolerability, and has been trying to follow appropriate diet.  Pt denies worsening depressive symptoms, suicidal ideation or panic. No fever, night sweats, wt loss, loss of appetite, or other constitutional symptoms.  Pt states good ability with ADL's, has low fall risk, home safety reviewed and adequate, no other significant changes in hearing or vision, and only occasionally active with exercise.  Had recent carotid ultrasound with some evidence for stenosis in [facility] screening.   Past Medical History  Diagnosis Date  . PSA, INCREASED 04/08/2008  . 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 HYPERTENSION 04/08/2008  . GERD 04/08/2008  . RENAL CALCULUS 02/11/2010   No past surgical history on file.  reports that he has never smoked. He does not have any smokeless tobacco history on file. His alcohol and drug histories are not on file. family history is not on file. Allergies  Allergen Reactions  . Atorvastatin    Current Outpatient Prescriptions on File Prior to Visit  Medication Sig Dispense Refill  . aspirin 81 MG tablet Take 81 mg by mouth daily.      . sildenafil (REVATIO) 20 MG tablet 3-5 tabs by mouth every other day as needed 60 tablet 2  . testosterone cypionate (DEPOTESTOTERONE CYPIONATE) 100 MG/ML injection Inject 1 mL (100 mg total) into the muscle every 28 (twenty-eight) days. 1ML once monthly 10 mL 5   No current facility-administered medications on file prior to visit.   Review of Systems Constitutional:  Negative for increased diaphoresis, other activity, appetite or siginficant weight change other than noted HENT: Negative for worsening hearing loss, ear pain, facial swelling, mouth sores and neck stiffness.   Eyes: Negative for other worsening pain, redness or visual disturbance.  Respiratory: Negative for shortness of breath and wheezing  Cardiovascular: Negative for chest pain and palpitations.  Gastrointestinal: Negative for diarrhea, blood in stool, abdominal distention or other pain Genitourinary: Negative for hematuria, flank pain or change in urine volume.  Musculoskeletal: Negative for myalgias or other joint complaints.  Skin: Negative for color change and wound or drainage.  Neurological: Negative for syncope and numbness. other than noted Hematological: Negative for adenopathy. or other swelling Psychiatric/Behavioral: Negative for hallucinations, SI, self-injury, decreased concentration or other worsening agitation.      Objective:   Physical Exam BP 110/60 mmHg  Pulse 70  Temp(Src) 97.9 F (36.6 C) (Oral)  Ht 6' (1.829 m)  Wt 206 lb 8 oz (93.668 kg)  BMI 28.00 kg/m2  SpO2 97% VS noted,  Constitutional: Pt is oriented to person, place, and time. Appears well-developed and well-nourished, in no significant distress Head: Normocephalic and atraumatic.  Right Ear: External ear normal.  Left Ear: External ear normal.  Nose: Nose normal.  Mouth/Throat: Oropharynx is clear and moist.  Eyes: Conjunctivae and EOM are normal. Pupils are equal, round, and reactive to light.  Neck: Normal range of motion. Neck supple. No JVD present. No tracheal deviation present or significant neck LA or mass Cardiovascular: Normal rate, regular rhythm, normal heart sounds and intact distal  pulses.   Pulmonary/Chest: Effort normal and breath sounds without rales or wheezing  Abdominal: Soft. Bowel sounds are normal. NT. No HSM  Musculoskeletal: Normal range of motion. Exhibits no edema.    Lymphadenopathy:  Has no cervical adenopathy.  Neurological: Pt is alert and oriented to person, place, and time. Pt has normal reflexes. No cranial nerve deficit. Motor grossly intact Skin: Skin is warm and dry. No rash noted.  Psychiatric:  Has normal mood and affect. Behavior is normal.     Assessment & Plan:

## 2014-09-18 NOTE — Telephone Encounter (Signed)
[facility] maintenance meds to [facility],/[facility]

## 2014-09-23 ENCOUNTER — Telehealth: Payer: Self-pay

## 2014-09-23 NOTE — Telephone Encounter (Signed)
Call to the patient to fup on eye exam; LVM on phone which message stated "this is Grant Bowers"  Stated that fup call was to ensure the patient made an eye apt for exam and if he needed assistance or referral to let us know.

## 2014-10-02 ENCOUNTER — Telehealth: Payer: Self-pay | Admitting: Internal Medicine

## 2014-10-02 MED ORDER — LEVOCETIRIZINE DIHYDROCHLORIDE 5 MG PO TABS
5.0000 mg | ORAL_TABLET | Freq: Every day | ORAL | Status: DC
Start: 2014-10-02 — End: 2015-09-23

## 2014-10-02 NOTE — Telephone Encounter (Signed)
Pt called in and said that his mail order sent him a letter that they do not cover the Allegra that was sent in on the 10th but they will cover Levocetirizine-dihydrochlorice.  Can this be called in instead?  To his mail order pharmacy

## 2014-10-02 NOTE — Telephone Encounter (Signed)
Please advise 

## 2014-10-02 NOTE — Telephone Encounter (Signed)
Pt aware.

## 2014-10-02 NOTE — Telephone Encounter (Signed)
This has been done.

## 2014-10-19 DIAGNOSIS — R972 Elevated prostate specific antigen [PSA]: Secondary | ICD-10-CM | POA: Diagnosis not present

## 2014-10-23 ENCOUNTER — Telehealth: Payer: Self-pay | Admitting: Internal Medicine

## 2014-10-23 DIAGNOSIS — E860 Dehydration: Secondary | ICD-10-CM | POA: Diagnosis not present

## 2014-10-23 DIAGNOSIS — R634 Abnormal weight loss: Secondary | ICD-10-CM | POA: Diagnosis not present

## 2014-10-23 DIAGNOSIS — I951 Orthostatic hypotension: Secondary | ICD-10-CM | POA: Diagnosis not present

## 2014-10-23 DIAGNOSIS — I10 Essential (primary) hypertension: Secondary | ICD-10-CM | POA: Diagnosis not present

## 2014-10-23 NOTE — Telephone Encounter (Signed)
[facility] [facility] - Client [place] Call Center Patient Name: Grant Bowers. DOB: [birth year] Initial Comment Caller States husbands BP 157/74 but has been fluctuating all day. usually his BP is low. he is feeling dizzy he is on meds. Nurse Assessment Nurse: Martyn Ehrich RN, Felicia Date/Time (Eastern Time): 10/23/2014 3:38:04 PM Confirm and document reason for call. If symptomatic, describe symptoms. ---Pt has blood pressure of 157/74 now. Now he is not week. Earlier it was 103/49 and ill and weak after golfing. Low blood pressure for a few days and lost 20# recently on Atkins diet over 4 wks Has the patient traveled out of the country within the last 30 days? ---No Does the patient require triage? ---Yes Related visit to physician within the last 2 weeks? ---No Does the PT have any chronic conditions? (i.e. diabetes, asthma, etc.) ---Yes List chronic conditions. ---HTNGuidelines Guideline Title Affirmed Question Affirmed Notes Low Blood Pressure [8] Fall in systolic BP > 20 mm Hg from normal AND [2] NOT dizzy, lightheaded, or weak (all triage questions negative) Final Disposition User See Physician within 24 Hours [place], RN, [place] Comments upgraded bc his BP is falling low more than one day in a row even though she got it up with salty soup. [facility] His MD is not in the office this afternoon Referrals [place] - SPECIFY Disagree/Comply: Comply  Call Id: [id number]

## 2015-01-18 DIAGNOSIS — R196 Halitosis: Secondary | ICD-10-CM | POA: Diagnosis not present

## 2015-01-18 DIAGNOSIS — J330 Polyp of nasal cavity: Secondary | ICD-10-CM | POA: Diagnosis not present

## 2015-03-15 DIAGNOSIS — M9903 Segmental and somatic dysfunction of lumbar region: Secondary | ICD-10-CM | POA: Diagnosis not present

## 2015-03-15 DIAGNOSIS — S336XXA Sprain of sacroiliac joint, initial encounter: Secondary | ICD-10-CM | POA: Diagnosis not present

## 2015-03-18 DIAGNOSIS — M9903 Segmental and somatic dysfunction of lumbar region: Secondary | ICD-10-CM | POA: Diagnosis not present

## 2015-03-18 DIAGNOSIS — S336XXA Sprain of sacroiliac joint, initial encounter: Secondary | ICD-10-CM | POA: Diagnosis not present

## 2015-04-21 DIAGNOSIS — Z0000 Encounter for general adult medical examination without abnormal findings: Secondary | ICD-10-CM | POA: Diagnosis not present

## 2015-06-24 ENCOUNTER — Telehealth: Payer: Self-pay | Admitting: *Deleted

## 2015-06-24 MED ORDER — TESTOSTERONE CYPIONATE 100 MG/ML IM SOLN: 100.0000 mg | INTRAMUSCULAR | Status: DC

## 2015-06-24 NOTE — Telephone Encounter (Signed)
Left msg on triage needing refill on his Testosterone...Grant Bowers

## 2015-06-24 NOTE — Telephone Encounter (Signed)
Called pt verifieid which pharmacy faxing script to [facility]...Grant Bowers

## 2015-06-24 NOTE — Telephone Encounter (Signed)
Done hardcopy to Corinne  

## 2015-07-05 ENCOUNTER — Other Ambulatory Visit: Payer: Self-pay | Admitting: Internal Medicine

## 2015-07-26 ENCOUNTER — Other Ambulatory Visit: Payer: Self-pay

## 2015-07-26 NOTE — Telephone Encounter (Signed)
Pt rq ref of testosterone.  Send to mail order Pended for review.

## 2015-07-27 MED ORDER — TESTOSTERONE CYPIONATE 100 MG/ML IM SOLN: 100.0000 mg | INTRAMUSCULAR | Status: DC

## 2015-07-27 NOTE — Telephone Encounter (Signed)
Done hardcopy to Corinne  

## 2015-07-28 NOTE — Telephone Encounter (Signed)
Medication sent.

## 2015-09-23 ENCOUNTER — Ambulatory Visit (INDEPENDENT_AMBULATORY_CARE_PROVIDER_SITE_OTHER): Payer: Medicare PPO | Admitting: Internal Medicine

## 2015-09-23 ENCOUNTER — Encounter: Payer: Self-pay | Admitting: Internal Medicine

## 2015-09-23 ENCOUNTER — Other Ambulatory Visit (INDEPENDENT_AMBULATORY_CARE_PROVIDER_SITE_OTHER): Payer: Medicare PPO

## 2015-09-23 VITALS — BP 140/82 | HR 67 | Temp 98.2°F | Resp 20 | Wt 195.0 lb

## 2015-09-23 DIAGNOSIS — R6889 Other general symptoms and signs: Secondary | ICD-10-CM | POA: Diagnosis not present

## 2015-09-23 DIAGNOSIS — Z0001 Encounter for general adult medical examination with abnormal findings: Secondary | ICD-10-CM

## 2015-09-23 DIAGNOSIS — E291 Testicular hypofunction: Secondary | ICD-10-CM | POA: Diagnosis not present

## 2015-09-23 DIAGNOSIS — E785 Hyperlipidemia, unspecified: Secondary | ICD-10-CM | POA: Diagnosis not present

## 2015-09-23 DIAGNOSIS — R972 Elevated prostate specific antigen [PSA]: Secondary | ICD-10-CM

## 2015-09-23 DIAGNOSIS — I6523 Occlusion and stenosis of bilateral carotid arteries: Secondary | ICD-10-CM

## 2015-09-23 DIAGNOSIS — Z1159 Encounter for screening for other viral diseases: Secondary | ICD-10-CM

## 2015-09-23 DIAGNOSIS — I6529 Occlusion and stenosis of unspecified carotid artery: Secondary | ICD-10-CM | POA: Insufficient documentation

## 2015-09-23 DIAGNOSIS — I10 Essential (primary) hypertension: Secondary | ICD-10-CM | POA: Diagnosis not present

## 2015-09-23 DIAGNOSIS — R739 Hyperglycemia, unspecified: Secondary | ICD-10-CM | POA: Diagnosis not present

## 2015-09-23 HISTORY — DX: Occlusion and stenosis of unspecified carotid artery: I65.29

## 2015-09-23 HISTORY — DX: Occlusion and stenosis of bilateral carotid arteries: I65.23

## 2015-09-23 LAB — CBC WITH DIFFERENTIAL/PLATELET
BASOS ABS: 0 10*3/uL (ref 0.0–0.1)
Basophils Relative: 0.6 % (ref 0.0–3.0)
EOS PCT: 3.4 % (ref 0.0–5.0)
Eosinophils Absolute: 0.2 10*3/uL (ref 0.0–0.7)
HCT: 43.4 % (ref 39.0–52.0)
HEMOGLOBIN: 14.8 g/dL (ref 13.0–17.0)
LYMPHS ABS: 1.2 10*3/uL (ref 0.7–4.0)
Lymphocytes Relative: 23 % (ref 12.0–46.0)
MCHC: 34.1 g/dL (ref 30.0–36.0)
MCV: 86.2 fl (ref 78.0–100.0)
MONO ABS: 0.5 10*3/uL (ref 0.1–1.0)
MONOS PCT: 9.1 % (ref 3.0–12.0)
NEUTROS PCT: 63.9 % (ref 43.0–77.0)
Neutro Abs: 3.4 10*3/uL (ref 1.4–7.7)
Platelets: 222 10*3/uL (ref 150.0–400.0)
RBC: 5.03 Mil/uL (ref 4.22–5.81)
RDW: 12.9 % (ref 11.5–15.5)
WBC: 5.3 10*3/uL (ref 4.0–10.5)

## 2015-09-23 LAB — HEPATIC FUNCTION PANEL
ALBUMIN: 4.4 g/dL (ref 3.5–5.2)
ALK PHOS: 76 U/L (ref 39–117)
ALT: 18 U/L (ref 0–53)
AST: 20 U/L (ref 0–37)
Bilirubin, Direct: 0.1 mg/dL (ref 0.0–0.3)
TOTAL PROTEIN: 6.5 g/dL (ref 6.0–8.3)
Total Bilirubin: 0.7 mg/dL (ref 0.2–1.2)

## 2015-09-23 LAB — URINALYSIS, ROUTINE W REFLEX MICROSCOPIC
BILIRUBIN URINE: NEGATIVE
HGB URINE DIPSTICK: NEGATIVE
KETONES UR: NEGATIVE
LEUKOCYTES UA: NEGATIVE
NITRITE: NEGATIVE
PH: 6 (ref 5.0–8.0)
Specific Gravity, Urine: 1.015 (ref 1.000–1.030)
TOTAL PROTEIN, URINE-UPE24: NEGATIVE
URINE GLUCOSE: NEGATIVE
UROBILINOGEN UA: 0.2 (ref 0.0–1.0)

## 2015-09-23 LAB — LIPID PANEL
CHOL/HDL RATIO: 3
CHOLESTEROL: 164 mg/dL (ref 0–200)
HDL: 48.8 mg/dL (ref >39.00)
LDL CALC: 106 mg/dL — AB (ref 0–99)
NonHDL: 115.31
TRIGLYCERIDES: 48 mg/dL (ref 0.0–149.0)
VLDL: 9.6 mg/dL (ref 0.0–40.0)

## 2015-09-23 LAB — TESTOSTERONE: TESTOSTERONE: 247.57 ng/dL — AB (ref 300.00–890.00)

## 2015-09-23 LAB — TSH: TSH: 1.19 u[IU]/mL (ref 0.35–4.50)

## 2015-09-23 LAB — PSA: PSA: 3.62 ng/mL (ref 0.10–4.00)

## 2015-09-23 LAB — BASIC METABOLIC PANEL
BUN: 18 mg/dL (ref 6–23)
CO2: 27 mEq/L (ref 19–32)
CREATININE: 1.1 mg/dL (ref 0.40–1.50)
Calcium: 9.3 mg/dL (ref 8.4–10.5)
Chloride: 108 mEq/L (ref 96–112)
GFR: 70.2 mL/min (ref >60.00)
GLUCOSE: 115 mg/dL — AB (ref 70–99)
POTASSIUM: 4.3 meq/L (ref 3.5–5.1)
Sodium: 140 mEq/L (ref 135–145)

## 2015-09-23 LAB — HEMOGLOBIN A1C: HEMOGLOBIN A1C: 5.7 % (ref 4.6–6.5)

## 2015-09-23 MED ORDER — ROSUVASTATIN CALCIUM 20 MG PO TABS: 20.0000 mg | ORAL_TABLET | Freq: Every day | ORAL | Status: DC

## 2015-09-23 NOTE — Progress Notes (Signed)
Subjective:    Patient ID: Grant Bowers, male    DOB: 10-04-1945, 70 y.o.   MRN: [id number]  HPI   Here for wellness and f/u;  Overall doing ok;  Pt denies Chest pain, worsening SOB, DOE, wheezing, orthopnea, PND, worsening LE edema, palpitations, dizziness or syncope. Pt denies polydipsia, polyuria, or low sugar symptoms. Pt states overall good compliance with treatment and medications, good tolerability, and has been trying to follow appropriate diet.  Pt denies worsening depressive symptoms, suicidal ideation or panic. No fever, night sweats, wt loss, loss of appetite, or other constitutional symptoms.  Pt states good ability with ADL's, has low fall risk, home safety reviewed and adequate, no other significant changes in hearing or vision, and only occasionally active with exercise.  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  Started atkins type diet about 1.r yrs, has lost wt, actually gained about 10 back he had lost. Started 212 to 180., now back to 195.   Wt Readings from Last 3 Encounters:  09/23/15 195 lb (88.451 kg)  09/17/14 206 lb 8 oz (93.668 kg)  09/02/13 207 lb (93.895 kg)   Denies urinary symptoms such as dysuria, frequency, urgency, flank pain, hematuria or n/v, fever, chills. Has seen urology, biopsy neg for prostate ca.  Due for f/u psa. Has some nocturia and cialis 5 mg helped, but flomax not so much help before, but cialis expensive.  Past Medical History  Diagnosis Date  . PSA, INCREASED 04/08/2008  . 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 HYPERTENSION 04/08/2008  . GERD 04/08/2008  . RENAL CALCULUS 02/11/2010   No past surgical history on file.  reports that he has never smoked. He does not have any smokeless tobacco history  on file. His alcohol and drug histories are not on file. family history is not on file. Allergies  Allergen Reactions  . Atorvastatin    Current Outpatient Prescriptions on File Prior to Visit  Medication Sig Dispense Refill  . amLODipine (NORVASC) 10 MG tablet TAKE 1 TABLET EVERY DAY 90 tablet 3  . aspirin 81 MG tablet Take 81 mg by mouth daily.      . fluticasone (FLONASE) 50 MCG/ACT nasal spray Place 2 sprays into both nostrils daily. 48 g 3  . irbesartan (AVAPRO) 300 MG tablet TAKE 1 TABLET AT BEDTIME 90 tablet 3  . labetalol (NORMODYNE) 300 MG tablet TAKE 1 TABLET TWICE DAILY  180 tablet 3  . omeprazole (PRILOSEC) 10 MG capsule Take 1 capsule (10 mg total) by mouth daily. 90 capsule 3  . sildenafil (REVATIO) 20 MG tablet 3-5 tabs by mouth every other day as needed 60 tablet 2  . testosterone cypionate (DEPOTESTOTERONE CYPIONATE) 100 MG/ML injection Inject 1 mL (100 mg total) into the muscle every 28 (twenty-eight) days. 1ML once monthly 10 mL 5   No current facility-administered medications on file prior to visit.     Review of Systems Constitutional: Negative for increased diaphoresis, or other activity, appetite or siginficant weight change other than noted HENT: Negative for worsening hearing loss, ear pain, facial swelling, mouth sores and neck stiffness.   Eyes: Negative for other worsening pain, redness or visual disturbance.  Respiratory: Negative for choking or stridor Cardiovascular: Negative for other chest pain  and palpitations.  Gastrointestinal: Negative for worsening diarrhea, blood in stool, or abdominal distention Genitourinary: Negative for hematuria, flank pain or change in urine volume.  Musculoskeletal: Negative for myalgias or other joint complaints.  Skin: Negative for other color change and wound or drainage.  Neurological: Negative for syncope and numbness. other than noted Hematological: Negative for adenopathy. or other swelling Psychiatric/Behavioral:  Negative for hallucinations, SI, self-injury, decreased concentration or other worsening agitation.      Objective:   Physical Exam BP 140/82 mmHg  Pulse 67  Temp(Src) 98.2 F (36.8 C) (Oral)  Resp 20  Wt 195 lb (88.451 kg)  SpO2 97% VS noted,  Constitutional: Pt is oriented to person, place, and time. Appears well-developed and well-nourished, in no significant distress Head: Normocephalic and atraumatic  Eyes: Conjunctivae and EOM are normal. Pupils are equal, round, and reactive to light Right Ear: External ear normal.  Left Ear: External ear normal Nose: Nose normal.  Mouth/Throat: Oropharynx is clear and moist  Neck: Normal range of motion. Neck supple. No JVD present. No tracheal deviation present or significant neck LA or mass Cardiovascular: Normal rate, regular rhythm, normal heart sounds and intact distal pulses.   Pulmonary/Chest: Effort normal and breath sounds without rales or wheezing  Abdominal: Soft. Bowel sounds are normal. NT. No HSM  Musculoskeletal: Normal range of motion. Exhibits no edema Lymphadenopathy: Has no cervical adenopathy.  Neurological: Pt is alert and oriented to person, place, and time. Pt has normal reflexes. No cranial nerve deficit. Motor grossly intact Skin: Skin is warm and dry. No rash noted or new ulcers Psychiatric:  Has normal mood and affect. Behavior is normal.     Assessment & Plan:

## 2015-09-23 NOTE — Assessment & Plan Note (Signed)
To cont testosterone replacement for now, pt advised can be assoc with BPH and elev psa

## 2015-09-23 NOTE — Patient Instructions (Signed)
Ok to stop the simvastatin  Please take all new medication as prescribed - the crestor 20 mg per day  Please continue all other medications as before, and refills have been done if requested.  Please have the pharmacy call with any other refills you may need.  Please continue your efforts at being more active, low cholesterol diet, and weight control.  You are otherwise up to date with prevention measures today.  Please keep your appointments with your specialists as you may have planned  You will be contacted regarding the referral for: Carotid ultrasound  Please go to the LAB in the Basement (turn left off the elevator) for the tests to be done today  You will be contacted by phone if any changes need to be made immediately.  Otherwise, you will receive a letter about your results with an explanation, but please check with MyChart first.  Please remember to sign up for MyChart if you have not done so, as this will be important to you in the future with finding out test results, communicating by private email, and scheduling acute appointments online when needed.  Please return in 1 year for your yearly visit, or sooner if needed, with Lab testing done 3-5 days before

## 2015-09-23 NOTE — Progress Notes (Signed)
Pre visit review using our clinic review tool, if applicable. No additional management support is needed unless otherwise documented below in the visit note. 

## 2015-09-24 LAB — HEPATITIS C ANTIBODY: HCV AB: NEGATIVE

## 2015-09-26 DIAGNOSIS — E782 Mixed hyperlipidemia: Secondary | ICD-10-CM | POA: Insufficient documentation

## 2015-09-26 DIAGNOSIS — E785 Hyperlipidemia, unspecified: Secondary | ICD-10-CM | POA: Insufficient documentation

## 2015-09-26 NOTE — Assessment & Plan Note (Signed)
Asympt, cont asa and statin, for carotid dopplers, to f/u any worsening symptoms or concerns

## 2015-09-26 NOTE — Assessment & Plan Note (Addendum)
Lab Results  Component Value Date   LDLCALC 106* 09/23/2015   For change zocor to crestor 20 qd, f/u lab next visit  In addition to the time spent performing CPE, I spent an additional 25 minutes face to face,in which greater than 50% of this time was spent in counseling and coordination of care for patient's acute illness as documented.

## 2015-09-26 NOTE — Assessment & Plan Note (Signed)
stable overall by history and exam, recent data reviewed with pt, and pt to continue medical treatment as before,  to f/u any worsening symptoms or concerns BP Readings from Last 3 Encounters:  09/23/15 140/82  09/17/14 110/60  09/02/13 142/70

## 2015-09-26 NOTE — Assessment & Plan Note (Signed)
Asympt, for a1c, cont focus on wt control and diet

## 2015-09-26 NOTE — Assessment & Plan Note (Signed)

## 2015-09-26 NOTE — Assessment & Plan Note (Signed)
Asympt, also for f/u psa today, consider urology referral

## 2015-10-18 ENCOUNTER — Encounter: Payer: Self-pay | Admitting: Internal Medicine

## 2015-10-19 ENCOUNTER — Telehealth: Payer: Self-pay | Admitting: Internal Medicine

## 2015-11-11 DIAGNOSIS — D485 Neoplasm of uncertain behavior of skin: Secondary | ICD-10-CM | POA: Diagnosis not present

## 2015-11-11 DIAGNOSIS — L570 Actinic keratosis: Secondary | ICD-10-CM | POA: Diagnosis not present

## 2015-11-11 DIAGNOSIS — L820 Inflamed seborrheic keratosis: Secondary | ICD-10-CM | POA: Diagnosis not present

## 2015-12-02 ENCOUNTER — Encounter (HOSPITAL_COMMUNITY): Payer: Medicare PPO

## 2015-12-07 ENCOUNTER — Ambulatory Visit: Payer: Medicare PPO | Admitting: Internal Medicine

## 2015-12-09 ENCOUNTER — Ambulatory Visit (HOSPITAL_COMMUNITY)
Admission: RE | Admit: 2015-12-09 | Discharge: 2015-12-09 | Disposition: A | Discharge status: Home / Self Care | Payer: Medicare PPO | Source: Ambulatory Visit | Attending: Internal Medicine | Admitting: Internal Medicine

## 2015-12-09 DIAGNOSIS — E785 Hyperlipidemia, unspecified: Secondary | ICD-10-CM | POA: Diagnosis not present

## 2015-12-09 DIAGNOSIS — I6523 Occlusion and stenosis of bilateral carotid arteries: Secondary | ICD-10-CM

## 2015-12-09 DIAGNOSIS — I10 Essential (primary) hypertension: Secondary | ICD-10-CM | POA: Insufficient documentation

## 2016-01-14 ENCOUNTER — Other Ambulatory Visit: Payer: Self-pay | Admitting: Internal Medicine

## 2016-03-07 ENCOUNTER — Telehealth: Payer: Self-pay | Admitting: Emergency Medicine

## 2016-03-07 MED ORDER — TAMSULOSIN HCL 0.4 MG PO CAPS: 0.4000 mg | ORAL_CAPSULE | Freq: Every day | ORAL | 5 refills | Status: DC

## 2016-03-07 MED ORDER — TAMSULOSIN HCL 0.4 MG PO CAPS: 0.4000 mg | ORAL_CAPSULE | Freq: Every day | ORAL | 1 refills | Status: DC

## 2016-03-07 NOTE — Telephone Encounter (Signed)
flomax done erx to [place]

## 2016-03-07 NOTE — Telephone Encounter (Signed)
Pt called and wants to know if you can write him a prescription for his prostate and going to the bathroom often. He has been taking something over the counter. Please advise thanks.

## 2016-06-08 ENCOUNTER — Other Ambulatory Visit: Payer: Self-pay | Admitting: Internal Medicine

## 2016-07-24 ENCOUNTER — Other Ambulatory Visit: Payer: Self-pay | Admitting: Internal Medicine

## 2016-08-07 ENCOUNTER — Telehealth: Payer: Self-pay | Admitting: Internal Medicine

## 2016-08-07 NOTE — Telephone Encounter (Signed)
[facility] - Client TELEPHONE [place] Call Center  Patient Name: Grant PERETZ SR.  DOB: 1945/07/25    Initial Comment Caller states tingling and numbness in both hands, feet and lips. Experiences some dizziness every couple of days.    Nurse Assessment  Nurse: Carlis Abbott, RN, Estill Bamberg Date/Time (Eastern Time): 08/07/2016 3:34:38 PM  Confirm and document reason for call. If symptomatic, describe symptoms. ---Caller states her husband has tingling and numbness in both hands, feet and lips. It has been going on for 3 weeks. Experiences some dizziness every couple of days. Denies chest pain, SOB, or other sx.  Does the patient have any new or worsening symptoms? ---Yes  Will a triage be completed? ---Yes  Related visit to physician within the last 2 weeks? ---No  Does the PT have any chronic conditions? (i.e. diabetes, asthma, etc.) ---Yes  List chronic conditions. ---HTN  Is this a behavioral health or substance abuse call? ---No     Guidelines    Guideline Title Affirmed Question Affirmed Notes  Neurologic Deficit [1] Numbness or tingling in one or both hands AND [2] is a chronic symptom (recurrent or ongoing AND present > 4 weeks)    Final Disposition User   See PCP When Office is Open (within 3 days) Carlis Abbott, RN, Estill Bamberg    Comments  He is at work now and doing okay. He has been fine today. Caller is just wanting an appointment.  Appt scheduled for tomorrow 08/08/16 at 4pm with Dr. Jenny Reichmann at the [facility] office. Caller voiced understanding.   Referrals  REFERRED TO PCP OFFICE   Disagree/Comply: Comply

## 2016-08-08 ENCOUNTER — Encounter: Payer: Self-pay | Admitting: Internal Medicine

## 2016-08-08 ENCOUNTER — Other Ambulatory Visit (INDEPENDENT_AMBULATORY_CARE_PROVIDER_SITE_OTHER): Payer: Medicare PPO

## 2016-08-08 ENCOUNTER — Ambulatory Visit (INDEPENDENT_AMBULATORY_CARE_PROVIDER_SITE_OTHER): Payer: Medicare PPO | Admitting: Internal Medicine

## 2016-08-08 VITALS — BP 144/72 | HR 73 | Ht 72.0 in | Wt 199.0 lb

## 2016-08-08 DIAGNOSIS — R202 Paresthesia of skin: Secondary | ICD-10-CM | POA: Diagnosis not present

## 2016-08-08 DIAGNOSIS — Z0001 Encounter for general adult medical examination with abnormal findings: Secondary | ICD-10-CM

## 2016-08-08 DIAGNOSIS — I10 Essential (primary) hypertension: Secondary | ICD-10-CM

## 2016-08-08 DIAGNOSIS — R739 Hyperglycemia, unspecified: Secondary | ICD-10-CM | POA: Diagnosis not present

## 2016-08-08 DIAGNOSIS — E291 Testicular hypofunction: Secondary | ICD-10-CM

## 2016-08-08 LAB — CBC WITH DIFFERENTIAL/PLATELET
BASOS ABS: 0 10*3/uL (ref 0.0–0.1)
Basophils Relative: 0.7 % (ref 0.0–3.0)
EOS ABS: 0.2 10*3/uL (ref 0.0–0.7)
Eosinophils Relative: 3.4 % (ref 0.0–5.0)
HEMATOCRIT: 42 % (ref 39.0–52.0)
HEMOGLOBIN: 14.1 g/dL (ref 13.0–17.0)
LYMPHS PCT: 21.2 % (ref 12.0–46.0)
Lymphs Abs: 1.3 10*3/uL (ref 0.7–4.0)
MCHC: 33.7 g/dL (ref 30.0–36.0)
MCV: 86.8 fl (ref 78.0–100.0)
MONOS PCT: 11.4 % (ref 3.0–12.0)
Monocytes Absolute: 0.7 10*3/uL (ref 0.1–1.0)
Neutro Abs: 4 10*3/uL (ref 1.4–7.7)
Neutrophils Relative %: 63.3 % (ref 43.0–77.0)
Platelets: 225 10*3/uL (ref 150.0–400.0)
RBC: 4.83 Mil/uL (ref 4.22–5.81)
RDW: 13.9 % (ref 11.5–15.5)
WBC: 6.3 10*3/uL (ref 4.0–10.5)

## 2016-08-08 MED ORDER — ROSUVASTATIN CALCIUM 20 MG PO TABS: 10.0000 mg | ORAL_TABLET | Freq: Every day | ORAL | 3 refills | Status: DC

## 2016-08-08 NOTE — Progress Notes (Signed)
Pre visit review using our clinic review tool, if applicable. No additional management support is needed unless otherwise documented below in the visit note. 

## 2016-08-08 NOTE — Progress Notes (Signed)
Subjective:    Patient ID: Grant Bowers, male    DOB: 01-26-1946, 71 y.o.   MRN: [id number]  HPI  Here for wellness and f/u;  Overall doing ok;  Pt denies Chest pain, worsening SOB, DOE, wheezing, orthopnea, PND, worsening LE edema, palpitations, dizziness or syncope.  Pt denies polydipsia, polyuria, or low sugar symptoms. Pt states overall good compliance with treatment and medications, good tolerability, and has been trying to follow appropriate diet.  Pt denies worsening depressive symptoms, suicidal ideation or panic. No fever, night sweats, wt loss, loss of appetite, or other constitutional symptoms.  Pt states good ability with ADL's, has low fall risk, home safety reviewed and adequate, no other significant changes in hearing or vision, and only occasionally active with exercise.   BP has been higher recently with wt gain  Wt Readings from Last 3 Encounters:  08/08/16 199 lb (90.3 kg)  09/23/15 195 lb (88.5 kg)  09/17/14 206 lb 8 oz (93.7 kg)   BP Readings from Last 3 Encounters:  08/08/16 (!) 144/72  09/23/15 140/82  09/17/14 110/60  Also here with wfie, Here with 3-4 wks with occasional tingling at lips, ands and feet, seems to happen all together, last 5 minutes but happens several times per day, no obvious triggers, nothing else makes better or worse, no prior hx.  Pt denies new neurological symptoms such as new headache, or facial or extremity weakness, but they are very concerned about the long list of possible problems they found reading about numbness on the internet.    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  Not dizzy now but did have some faintness with rapid wt loss prior to regaining some recent, has been also seen at [facility], amlod reduced to 5 mg, but now BP higher again.  States has been compliant and not incresaed his monthly testosterone injections.   Past Medical History:  Diagnosis Date  . Carotid artery  stenosis 09/23/2015  . GERD 04/08/2008  . HYPERTENSION 04/08/2008  . 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 PSA, INCREASED 04/08/2008  . RENAL CALCULUS 02/11/2010   No past surgical history on file.  reports that he has never smoked. He has never used smokeless tobacco. His alcohol and drug histories are not on file. family history is not on file. Allergies  Allergen Reactions  . Atorvastatin    Current Outpatient Prescriptions on File Prior to Visit  Medication Sig Dispense Refill  . amLODipine (NORVASC) 10 MG tablet TAKE 1 TABLET EVERY DAY 90 tablet 1  . aspirin 81 MG tablet Take 81 mg by mouth daily.      . fluticasone (FLONASE) 50 MCG/ACT nasal spray USE 2 SPRAYS IN EACH NOSTRIL EVERY DAY 48 g 3  . irbesartan (AVAPRO) 300 MG tablet TAKE 1 TABLET AT BEDTIME 90 tablet 1  . labetalol (NORMODYNE) 300 MG tablet TAKE 1 TABLET TWICE DAILY  180 tablet 1  . omeprazole (PRILOSEC) 10 MG capsule TAKE 1 CAPSULE EVERY DAY 90 capsule 3  . sildenafil (REVATIO) 20 MG tablet 3-5 tabs by mouth every other day as needed 60 tablet 2  . tamsulosin (FLOMAX) 0.4 MG CAPS capsule Take 1 capsule (0.4 mg total) by mouth daily. PT NEEDS PHYSICAL FOR ADDITIONAL REFILLS 90 capsule 0   No current facility-administered medications on file prior to visit.    Review of Systems Constitutional: Negative for other unusual diaphoresis, sweats, appetite or weight changes HENT:  Negative for other worsening hearing loss, ear pain, facial swelling, mouth sores or neck stiffness.   Eyes: Negative for other worsening pain, redness or other visual disturbance.  Respiratory: Negative for other stridor or swelling Cardiovascular: Negative for other palpitations or other chest pain  Gastrointestinal: Negative for worsening diarrhea or loose stools, blood in stool, distention or other pain Genitourinary: Negative for hematuria, flank pain or other change in urine volume.  Musculoskeletal: Negative for myalgias or other joint swelling.   Skin: Negative for other color change, or other wound or worsening drainage.  Neurological: Negative for other syncope or numbness. Hematological: Negative for other adenopathy or swelling Psychiatric/Behavioral: Negative for hallucinations, other worsening agitation, SI, self-injury, or new decreased concentration All other system neg per pt    Objective:   Physical Exam BP (!) 144/72   Pulse 73   Ht 6' (1.829 m)   Wt 199 lb (90.3 kg)   SpO2 99%   BMI 26.99 kg/m  VS noted,  Constitutional: Pt is oriented to person, place, and time. Appears well-developed and well-nourished, in no significant distress and comfortable Head: Normocephalic and atraumatic  Eyes: Conjunctivae and EOM are normal. Pupils are equal, round, and reactive to light Right Ear: External ear normal without discharge Left Ear: External ear normal without discharge Nose: Nose without discharge or deformity Mouth/Throat: Oropharynx is without other ulcerations and moist  Neck: Normal range of motion. Neck supple. No JVD present. No tracheal deviation present or significant neck LA or mass Cardiovascular: Normal rate, regular rhythm, normal heart sounds and intact distal pulses.   Pulmonary/Chest: WOB normal and breath sounds without rales or wheezing  Abdominal: Soft. Bowel sounds are normal. NT. No HSM  Musculoskeletal: Normal range of motion. Exhibits no edema Lymphadenopathy: Has no other cervical adenopathy.  Neurological: Pt is alert and oriented to person, place, and time. Pt has normal reflexes. No cranial nerve deficit. Motor grossly intact, Gait intact Skin: Skin is warm and dry. No rash noted or new ulcerations Psychiatric:  Has mild nervous mood and affect. Behavior is normal without agitation No other exam findings      Assessment & Plan:

## 2016-08-08 NOTE — Patient Instructions (Signed)
OK to increase the amlodipine back to 10 mg per day  Please continue all other medications as before, and refills have been done if requested.  Please have the pharmacy call with any other refills you may need.  Please continue your efforts at being more active, low cholesterol diet, and weight control.  You are otherwise up to date with prevention measures today.  Please keep your appointments with your specialists as you may have planned  Please go to the LAB in the Basement (turn left off the elevator) for the tests to be done today  You will be contacted by phone if any changes need to be made immediately.  Otherwise, you will receive a letter about your results with an explanation, but please check with MyChart first.  Please remember to sign up for MyChart if you have not done so, as this will be important to you in the future with finding out test results, communicating by private email, and scheduling acute appointments online when needed.  Please return in 1 year for your yearly visit, or sooner if needed, with Lab testing done 3-5 days before

## 2016-08-09 ENCOUNTER — Telehealth: Payer: Self-pay

## 2016-08-09 ENCOUNTER — Other Ambulatory Visit: Payer: Self-pay | Admitting: Internal Medicine

## 2016-08-09 DIAGNOSIS — R3129 Other microscopic hematuria: Secondary | ICD-10-CM

## 2016-08-09 DIAGNOSIS — R972 Elevated prostate specific antigen [PSA]: Secondary | ICD-10-CM

## 2016-08-09 LAB — TESTOSTERONE: TESTOSTERONE: 171.21 ng/dL — AB (ref 300.00–890.00)

## 2016-08-09 LAB — HEPATIC FUNCTION PANEL
ALT: 11 U/L (ref 0–53)
AST: 13 U/L (ref 0–37)
Albumin: 4.4 g/dL (ref 3.5–5.2)
Alkaline Phosphatase: 82 U/L (ref 39–117)
BILIRUBIN DIRECT: 0.1 mg/dL (ref 0.0–0.3)
BILIRUBIN TOTAL: 0.6 mg/dL (ref 0.2–1.2)
TOTAL PROTEIN: 6.4 g/dL (ref 6.0–8.3)

## 2016-08-09 LAB — PSA: PSA: 5.41 ng/mL — ABNORMAL HIGH (ref 0.10–4.00)

## 2016-08-09 LAB — LIPID PANEL
CHOL/HDL RATIO: 4
Cholesterol: 190 mg/dL (ref 0–200)
HDL: 44.8 mg/dL (ref >39.00)
LDL Cholesterol: 129 mg/dL — ABNORMAL HIGH (ref 0–99)
NONHDL: 144.81
TRIGLYCERIDES: 79 mg/dL (ref 0.0–149.0)
VLDL: 15.8 mg/dL (ref 0.0–40.0)

## 2016-08-09 LAB — BASIC METABOLIC PANEL
BUN: 15 mg/dL (ref 6–23)
CALCIUM: 9.3 mg/dL (ref 8.4–10.5)
CHLORIDE: 108 meq/L (ref 96–112)
CO2: 28 mEq/L (ref 19–32)
CREATININE: 1.08 mg/dL (ref 0.40–1.50)
GFR: 71.52 mL/min (ref >60.00)
Glucose, Bld: 95 mg/dL (ref 70–99)
Potassium: 4.5 mEq/L (ref 3.5–5.1)
Sodium: 141 mEq/L (ref 135–145)

## 2016-08-09 LAB — URINALYSIS, ROUTINE W REFLEX MICROSCOPIC
BILIRUBIN URINE: NEGATIVE
Ketones, ur: NEGATIVE
LEUKOCYTES UA: NEGATIVE
Nitrite: NEGATIVE
PH: 5.5 (ref 5.0–8.0)
Total Protein, Urine: 100 — AB
Urine Glucose: NEGATIVE
Urobilinogen, UA: 0.2 (ref 0.0–1.0)

## 2016-08-09 LAB — VITAMIN B12: VITAMIN B 12: 350 pg/mL (ref 211–911)

## 2016-08-09 LAB — HEMOGLOBIN A1C: Hgb A1c MFr Bld: 5.9 % (ref 4.6–6.5)

## 2016-08-09 LAB — TSH: TSH: 1.48 u[IU]/mL (ref 0.35–4.50)

## 2016-08-09 MED ORDER — TESTOSTERONE CYPIONATE 100 MG/ML IM SOLN: 100.0000 mg | INTRAMUSCULAR | 5 refills | Status: DC

## 2016-08-09 NOTE — Telephone Encounter (Signed)
-----   Message from Biagio Borg, MD sent at 08/09/2016  1:10 PM EDT ----- Left message on MyChart, pt to cont same tx except  .The test results show that your current treatment is OK, except the testosterone is still low, and LDL cholesterol is mildly elevated, the PSA is slightly elevated again, and there is a new finding of small amount of blood in the urine. The last note I could find was from august 2016 when you have seen Dr Karsten Ro.   At this point, the testosterone should be increased to every 2 weeks, and follow a lower choltesterol diet if possible as you have not been able to take higher dose crestor, and we will need to refer you back to Dr Karsten Ro for evaluation of the psa and the blood in the urine. You should hear from the office about this as well.   Bhavik Cabiness to please inform pt, I will do rx for testosterone, and referral to Dr Ottelin/urology

## 2016-08-09 NOTE — Telephone Encounter (Signed)
Pt has been informed and expressed understanding.  

## 2016-08-10 NOTE — Assessment & Plan Note (Signed)
Here for wellness and f/u;  Overall doing ok;  Pt denies Chest pain, worsening SOB, DOE, wheezing, orthopnea, PND, worsening LE edema, palpitations, dizziness or syncope.  Pt denies neurological change such as new headache, facial or extremity weakness.  Pt denies polydipsia, polyuria, or low sugar symptoms. Pt states overall good compliance with treatment and medications, good tolerability, and has been trying to follow appropriate diet.  Pt denies worsening depressive symptoms, suicidal ideation or panic. No fever, night sweats, wt loss, loss of appetite, or other constitutional symptoms.  Pt states good ability with ADL's, has low fall risk, home safety reviewed and adequate, no other significant changes in hearing or vision, and only occasionally active with exercise.   

## 2016-08-10 NOTE — Assessment & Plan Note (Signed)
Etiology unclear, ? Anxiety component, for b12 in addition to labs as docurmented, to consider neurology referral if persists or worsens, consdier cns imaging but doubt MS given rapid off and on symptoms  In addition to the time spent performing CPE, I spent an additional 25 minutes face to face,in which greater than 50% of this time was spent in counseling and coordination of care for patient's acute illness as documented, including the d/w pt of the long list of possible etiologies of intermittent paresthesias, elevated BP, elevated sugar and low testosterone, and the diagnosis, evaluation and tx of each

## 2016-08-11 ENCOUNTER — Other Ambulatory Visit: Payer: Self-pay | Admitting: *Deleted

## 2016-08-11 MED ORDER — TESTOSTERONE CYPIONATE 200 MG/ML IM SOLN: 100.0000 mg | INTRAMUSCULAR | 0 refills | Status: DC

## 2016-08-11 MED ORDER — TESTOSTERONE CYPIONATE 100 MG/ML IM SOLN: 100.0000 mg | INTRAMUSCULAR | 1 refills | Status: DC

## 2016-08-11 NOTE — Telephone Encounter (Signed)
Rec'd fax stating rx for testosterone cyp 100 mg/ml has two sets of directions on prescription. Called [facility] spoke w/pharmacist gave MD instructuions for testosterone. Pharmacist states that the 10 ml vial that was sent in pt would have to discard every 28 days after opening and would be wasting. MD can change to a 200 mg/ml, and pt can take 1/2. This amount comes in a 1 ml vial which they can dispense 6 vials for 6 months. Pls  Advise...Johny Chess

## 2016-08-11 NOTE — Telephone Encounter (Signed)
Ok , Done hardcopy to [employer]

## 2016-08-11 NOTE — Telephone Encounter (Signed)
Faxed

## 2016-08-15 ENCOUNTER — Other Ambulatory Visit: Payer: Self-pay | Admitting: Internal Medicine

## 2016-08-15 ENCOUNTER — Telehealth: Payer: Self-pay

## 2016-08-15 DIAGNOSIS — R202 Paresthesia of skin: Secondary | ICD-10-CM

## 2016-08-15 NOTE — Telephone Encounter (Signed)
I agree with below, ok for referral to [facility].  I have documented and recall that I did address the the paresthesias and the urinary bleeding  This is the first time I have heard of worsening pain assoc with the bleeding.  I can see pt back urgently if needed, to consider CT abd to r/o stone  OK to cont with referrals to neurology and urology as ordered  I regret his wife finds the referral process to take longer than her patience, and she appears to be frustrated with not having the exact etiology yet determined for the paresthesias and urinary symptoms

## 2016-08-15 NOTE — Telephone Encounter (Signed)
Pt's wife called upset wanting an explanation on why the referral process takes so long. I explained to her it takes 2-3 weeks and explained to her that is across the board for all specialties. She stated that her husband has a history is urinary/kidney stone issues and she fears that it he waits any longer that it will worse by the time an appt can be made. She is concerned about the blood in his urine that he is still experiencing and mentioned that he is now having serious pain . She also stated this is not the first time that a referral took "forever" and wants something to be done about it. She stated during his vist last week Dr. Jenny Reichmann did not touch on the issues that her husband had concerning the numbness/tingling he is having is his lips, hands and feet. She stated that no one up here cares about what is going on with her husband and stated that we need to do what is needed right then and not when we feel like it. I apologized that the process is taking so long and that I was unsure of what the hold up was on the other end because what needed to be done here was sent via fax to [facility] last week. She then asked me for my name which I gave to her and told her I'm Dr. Judi Cong assistant. However she asked for my last name and I politely declined giving her that information because I didn't feel like that was necessary. I told her that I would share her concerns with Dr. Jenny Reichmann and Almyra Free and would be in touch with her when I find out more information and stated that the manager might be reaching out to her as well to touch base.

## 2016-08-16 ENCOUNTER — Encounter: Payer: Self-pay | Admitting: Neurology

## 2016-08-22 DIAGNOSIS — R3121 Asymptomatic microscopic hematuria: Secondary | ICD-10-CM | POA: Diagnosis not present

## 2016-08-22 DIAGNOSIS — N400 Enlarged prostate without lower urinary tract symptoms: Secondary | ICD-10-CM | POA: Diagnosis not present

## 2016-08-22 DIAGNOSIS — R310 Gross hematuria: Secondary | ICD-10-CM | POA: Diagnosis not present

## 2016-08-22 DIAGNOSIS — R972 Elevated prostate specific antigen [PSA]: Secondary | ICD-10-CM | POA: Diagnosis not present

## 2016-08-31 DIAGNOSIS — N200 Calculus of kidney: Secondary | ICD-10-CM | POA: Diagnosis not present

## 2016-08-31 DIAGNOSIS — R3121 Asymptomatic microscopic hematuria: Secondary | ICD-10-CM | POA: Diagnosis not present

## 2016-09-05 DIAGNOSIS — R3121 Asymptomatic microscopic hematuria: Secondary | ICD-10-CM | POA: Diagnosis not present

## 2016-09-05 DIAGNOSIS — N200 Calculus of kidney: Secondary | ICD-10-CM | POA: Diagnosis not present

## 2016-09-05 DIAGNOSIS — R972 Elevated prostate specific antigen [PSA]: Secondary | ICD-10-CM | POA: Diagnosis not present

## 2016-09-05 DIAGNOSIS — N281 Cyst of kidney, acquired: Secondary | ICD-10-CM | POA: Diagnosis not present

## 2016-09-28 ENCOUNTER — Other Ambulatory Visit: Payer: Self-pay | Admitting: Internal Medicine

## 2016-10-23 ENCOUNTER — Ambulatory Visit: Payer: Medicare PPO | Admitting: Neurology

## 2016-10-31 ENCOUNTER — Other Ambulatory Visit: Payer: Self-pay | Admitting: Internal Medicine

## 2016-11-22 DIAGNOSIS — R972 Elevated prostate specific antigen [PSA]: Secondary | ICD-10-CM | POA: Diagnosis not present

## 2017-01-02 ENCOUNTER — Telehealth: Payer: Self-pay | Admitting: Internal Medicine

## 2017-01-02 MED ORDER — TADALAFIL 20 MG PO TABS: 20.0000 mg | ORAL_TABLET | Freq: Every day | ORAL | 11 refills | Status: DC | PRN

## 2017-01-02 NOTE — Telephone Encounter (Signed)
Done hardcopy to [facility]  

## 2017-01-02 NOTE — Telephone Encounter (Signed)
Ok cialis done to [facility]

## 2017-01-02 NOTE — Telephone Encounter (Signed)
Pt is wanting to pick-up rx.Marland KitchenJohny Bowers

## 2017-01-02 NOTE — Telephone Encounter (Signed)
Pt called in and would like to see if Dr Waynard Reeds can give him a script for cialis 20 mg because he can cut them in 1/2.  He wants to pick paper script so he can go check the price at different places.  He is going to pay for it out of pocket  Best number [phone]

## 2017-01-03 NOTE — Telephone Encounter (Signed)
Called pt, informed pt via [facility] at front desk.  

## 2017-01-26 ENCOUNTER — Other Ambulatory Visit: Payer: Self-pay | Admitting: Internal Medicine

## 2017-01-26 MED ORDER — TESTOSTERONE CYPIONATE 200 MG/ML IM SOLN: 100.0000 mg | INTRAMUSCULAR | 5 refills | Status: DC

## 2017-01-26 NOTE — Telephone Encounter (Signed)
Done hardcopy to [facility]  

## 2017-01-26 NOTE — Telephone Encounter (Signed)
Faxed

## 2017-02-27 DIAGNOSIS — R972 Elevated prostate specific antigen [PSA]: Secondary | ICD-10-CM | POA: Diagnosis not present

## 2017-03-06 DIAGNOSIS — N5201 Erectile dysfunction due to arterial insufficiency: Secondary | ICD-10-CM | POA: Diagnosis not present

## 2017-03-06 DIAGNOSIS — R972 Elevated prostate specific antigen [PSA]: Secondary | ICD-10-CM | POA: Diagnosis not present

## 2017-03-06 DIAGNOSIS — N400 Enlarged prostate without lower urinary tract symptoms: Secondary | ICD-10-CM | POA: Diagnosis not present

## 2017-03-12 DIAGNOSIS — Z0000 Encounter for general adult medical examination without abnormal findings: Secondary | ICD-10-CM | POA: Diagnosis not present

## 2017-03-30 ENCOUNTER — Other Ambulatory Visit: Payer: Self-pay | Admitting: Internal Medicine

## 2017-03-30 MED ORDER — ROSUVASTATIN CALCIUM 20 MG PO TABS: 10.0000 mg | ORAL_TABLET | Freq: Every day | ORAL | 3 refills | Status: DC

## 2017-04-05 DIAGNOSIS — N390 Urinary tract infection, site not specified: Secondary | ICD-10-CM | POA: Diagnosis not present

## 2017-04-05 DIAGNOSIS — I10 Essential (primary) hypertension: Secondary | ICD-10-CM | POA: Diagnosis not present

## 2017-04-05 DIAGNOSIS — N401 Enlarged prostate with lower urinary tract symptoms: Secondary | ICD-10-CM | POA: Diagnosis not present

## 2017-04-23 DIAGNOSIS — S336XXA Sprain of sacroiliac joint, initial encounter: Secondary | ICD-10-CM | POA: Diagnosis not present

## 2017-04-23 DIAGNOSIS — M9903 Segmental and somatic dysfunction of lumbar region: Secondary | ICD-10-CM | POA: Diagnosis not present

## 2017-04-27 ENCOUNTER — Telehealth: Payer: Self-pay | Admitting: Internal Medicine

## 2017-04-27 MED ORDER — ROSUVASTATIN CALCIUM 20 MG PO TABS: 10.0000 mg | ORAL_TABLET | Freq: Every day | ORAL | 3 refills | Status: DC

## 2017-04-27 NOTE — Telephone Encounter (Signed)
Pt has been informed and refills have been sent in to [facility] mail order.

## 2017-04-27 NOTE — Telephone Encounter (Signed)
Crestor has not been recalled or discontinued.  I would suggest back on crestor unless his insurance is saying it is not covered.

## 2017-04-27 NOTE — Telephone Encounter (Signed)
Copied from [place] [id number]. Topic: Quick Communication - See Telephone Encounter >> Apr 27, 2017  9:10 AM Percell Belt A wrote: CRM for notification. See Telephone encounter for: pt called in and said that he requested refill of his rosuvastatin (CRESTOR) 20 MG tablet [[id number]] in dec but the pharmacy told him that it was discontinued. He would like to know what alterative could be called in?   He is completely out and needs this med asap.  He did want to note that he has tried Lipitor and it did not work for him.  04/27/17.

## 2017-05-05 MED ORDER — ROSUVASTATIN CALCIUM 20 MG PO TABS: 10.0000 mg | ORAL_TABLET | Freq: Every day | ORAL | 3 refills | Status: DC

## 2017-05-05 NOTE — Addendum Note (Signed)
Addended by: Karren Cobble on: 05/05/2017 09:20 AM   Modules accepted: Orders

## 2017-05-31 ENCOUNTER — Telehealth: Payer: Self-pay | Admitting: Internal Medicine

## 2017-05-31 MED ORDER — TESTOSTERONE CYPIONATE 200 MG/ML IM SOLN: 100.0000 mg | INTRAMUSCULAR | 5 refills | Status: DC

## 2017-05-31 NOTE — Telephone Encounter (Signed)
Done for every 14 days as this is correct, thanks

## 2017-05-31 NOTE — Addendum Note (Signed)
Addended by: Biagio Borg on: 05/31/2017 01:08 PM   Modules accepted: Orders

## 2017-05-31 NOTE — Telephone Encounter (Signed)
Copied from [place]. Topic: Quick Communication - See Telephone Encounter >> May 31, 2017 10:50 AM Oneta Rack wrote: relation to pt: self  Call back number:[phone] Reason for call:  Patient states [facility] is unaware the frequency for  testosterone cypionate (DEPOTESTOSTERONE CYPIONATE) 200 MG/ML injection  has changed and would like a new Rx sent in reflecting new frequency "CYP every 15 days" please advise.

## 2017-07-19 ENCOUNTER — Other Ambulatory Visit: Payer: Self-pay | Admitting: Internal Medicine

## 2017-08-14 ENCOUNTER — Other Ambulatory Visit (INDEPENDENT_AMBULATORY_CARE_PROVIDER_SITE_OTHER): Payer: Medicare PPO

## 2017-08-14 ENCOUNTER — Ambulatory Visit (INDEPENDENT_AMBULATORY_CARE_PROVIDER_SITE_OTHER): Payer: Medicare PPO | Admitting: Internal Medicine

## 2017-08-14 ENCOUNTER — Encounter: Payer: Self-pay | Admitting: Internal Medicine

## 2017-08-14 VITALS — BP 126/76 | HR 70 | Temp 97.6°F | Ht 72.0 in | Wt 202.0 lb

## 2017-08-14 DIAGNOSIS — Z0000 Encounter for general adult medical examination without abnormal findings: Secondary | ICD-10-CM

## 2017-08-14 DIAGNOSIS — E291 Testicular hypofunction: Secondary | ICD-10-CM

## 2017-08-14 DIAGNOSIS — R739 Hyperglycemia, unspecified: Secondary | ICD-10-CM | POA: Diagnosis not present

## 2017-08-14 LAB — LIPID PANEL
Cholesterol: 145 mg/dL (ref 0–200)
HDL: 45.4 mg/dL (ref >39.00)
LDL Cholesterol: 89 mg/dL (ref 0–99)
NonHDL: 99.93
Total CHOL/HDL Ratio: 3
Triglycerides: 56 mg/dL (ref 0.0–149.0)
VLDL: 11.2 mg/dL (ref 0.0–40.0)

## 2017-08-14 LAB — HEPATIC FUNCTION PANEL
ALT: 18 U/L (ref 0–53)
AST: 23 U/L (ref 0–37)
Albumin: 4.4 g/dL (ref 3.5–5.2)
Alkaline Phosphatase: 75 U/L (ref 39–117)
Bilirubin, Direct: 0.1 mg/dL (ref 0.0–0.3)
Total Bilirubin: 0.7 mg/dL (ref 0.2–1.2)
Total Protein: 6.6 g/dL (ref 6.0–8.3)

## 2017-08-14 LAB — HEMOGLOBIN A1C: Hgb A1c MFr Bld: 5.8 % (ref 4.6–6.5)

## 2017-08-14 LAB — URINALYSIS, ROUTINE W REFLEX MICROSCOPIC
Hgb urine dipstick: NEGATIVE
Ketones, ur: 15 — AB
Leukocytes, UA: NEGATIVE
Nitrite: NEGATIVE
Specific Gravity, Urine: >=1.03 — AB (ref 1.000–1.030)
Total Protein, Urine: 100 — AB
Urine Glucose: NEGATIVE
Urobilinogen, UA: 0.2 (ref 0.0–1.0)
pH: 5.5 (ref 5.0–8.0)

## 2017-08-14 LAB — CBC WITH DIFFERENTIAL/PLATELET
Basophils Absolute: 0 K/uL (ref 0.0–0.1)
Basophils Relative: 0.4 % (ref 0.0–3.0)
Eosinophils Absolute: 0.2 K/uL (ref 0.0–0.7)
Eosinophils Relative: 3 % (ref 0.0–5.0)
HCT: 45 % (ref 39.0–52.0)
Hemoglobin: 15.8 g/dL (ref 13.0–17.0)
Lymphocytes Relative: 22 % (ref 12.0–46.0)
Lymphs Abs: 1.3 K/uL (ref 0.7–4.0)
MCHC: 35.2 g/dL (ref 30.0–36.0)
MCV: 84.8 fl (ref 78.0–100.0)
Monocytes Absolute: 0.5 K/uL (ref 0.1–1.0)
Monocytes Relative: 8 % (ref 3.0–12.0)
Neutro Abs: 3.8 K/uL (ref 1.4–7.7)
Neutrophils Relative %: 66.6 % (ref 43.0–77.0)
Platelets: 241 K/uL (ref 150.0–400.0)
RBC: 5.31 Mil/uL (ref 4.22–5.81)
RDW: 13.6 % (ref 11.5–15.5)
WBC: 5.7 K/uL (ref 4.0–10.5)

## 2017-08-14 LAB — PSA: PSA: 7.5 ng/mL — ABNORMAL HIGH (ref 0.10–4.00)

## 2017-08-14 LAB — BASIC METABOLIC PANEL WITH GFR
BUN: 18 mg/dL (ref 6–23)
CO2: 26 meq/L (ref 19–32)
Calcium: 9.5 mg/dL (ref 8.4–10.5)
Chloride: 108 meq/L (ref 96–112)
Creatinine, Ser: 1.06 mg/dL (ref 0.40–1.50)
GFR: 72.87 mL/min (ref >60.00)
Glucose, Bld: 120 mg/dL — ABNORMAL HIGH (ref 70–99)
Potassium: 4.5 meq/L (ref 3.5–5.1)
Sodium: 142 meq/L (ref 135–145)

## 2017-08-14 LAB — TSH: TSH: 1.43 u[IU]/mL (ref 0.35–4.50)

## 2017-08-14 LAB — TESTOSTERONE: Testosterone: 140.57 ng/dL — ABNORMAL LOW (ref 300.00–890.00)

## 2017-08-14 NOTE — Assessment & Plan Note (Signed)

## 2017-08-14 NOTE — Assessment & Plan Note (Signed)
stable overall by history and exam, recent data reviewed with pt, and pt to continue medical treatment as before,  to f/u any worsening symptoms or concerns' Lab Results  Component Value Date   HGBA1C 5.8 08/14/2017

## 2017-08-14 NOTE — Patient Instructions (Addendum)
You will be contacted regarding the referral for: colonoscopy  Please continue all other medications as before, and refills have been done if requested.  Please have the pharmacy call with any other refills you may need.  Please continue your efforts at being more active, low cholesterol diet, and weight control.  You are otherwise up to date with prevention measures today.  Please keep your appointments with your specialists as you may have planned  Please go to the LAB in the Basement (turn left off the elevator) for the tests to be done today  You will be contacted by phone if any changes need to be made immediately.  Otherwise, you will receive a letter about your results with an explanation, but please check with MyChart first.  Please remember to sign up for MyChart if you have not done so, as this will be important to you in the future with finding out test results, communicating by private email, and scheduling acute appointments online when needed.  Please return in 1 year for your yearly visit, or sooner if needed

## 2017-08-14 NOTE — Progress Notes (Signed)
Subjective:    Patient ID: Grant Bowers, male    DOB: 05-25-1945, 72 y.o.   MRN: [id number]  HPI  Here for wellness and f/u;  Overall doing ok;  Pt denies Chest pain, worsening SOB, DOE, wheezing, orthopnea, PND, worsening LE edema, palpitations, dizziness or syncope.  Pt denies neurological change such as new headache, facial or extremity weakness.  Pt denies polydipsia, polyuria, or low sugar symptoms. Pt states overall good compliance with treatment and medications, good tolerability, and has been trying to follow appropriate diet.  Pt denies worsening depressive symptoms, suicidal ideation or panic. No fever, night sweats, wt loss, loss of appetite, or other constitutional symptoms.  Pt states good ability with ADL's, has low fall risk, home safety reviewed and adequate, no other significant changes in hearing or vision, and occasionally active with exercise. Denies urinary symptoms such as dysuria, frequency, urgency, flank pain, hematuria or n/v, fever, chills.  No other new complaints or interval hx BP Readings from Last 3 Encounters:  08/14/17 126/76  08/08/16 (!) 144/72  09/23/15 140/82   Wt Readings from Last 3 Encounters:  08/14/17 202 lb (91.6 kg)  08/08/16 199 lb (90.3 kg)  09/23/15 195 lb (88.5 kg)  Due for Tdap but declines for now. Due for colonscopy. Past Medical History:  Diagnosis Date  . Carotid artery stenosis 09/23/2015  . GERD 04/08/2008  . HYPERTENSION 04/08/2008  . 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 PSA, INCREASED 04/08/2008  . RENAL CALCULUS 02/11/2010   No past surgical history on file.  reports that he has never smoked. He has never used smokeless tobacco. His alcohol and drug histories are not on file. family history is not on file. Allergies  Allergen Reactions  . Atorvastatin    Current Outpatient Medications on File Prior to Visit  Medication Sig Dispense Refill  . amLODipine (NORVASC) 10 MG tablet TAKE 1 TABLET EVERY DAY 90 tablet 2  . aspirin 81 MG  tablet Take 81 mg by mouth daily.      . fluticasone (FLONASE) 50 MCG/ACT nasal spray USE 2 SPRAYS IN EACH NOSTRIL EVERY DAY 48 g 3  . irbesartan (AVAPRO) 300 MG tablet TAKE 1 TABLET AT BEDTIME 90 tablet 2  . labetalol (NORMODYNE) 300 MG tablet TAKE 1 TABLET TWICE DAILY  180 tablet 2  . omeprazole (PRILOSEC) 10 MG capsule TAKE 1 CAPSULE EVERY DAY 90 capsule 2  . rosuvastatin (CRESTOR) 20 MG tablet Take 0.5 tablets (10 mg total) by mouth daily. 45 tablet 3  . sildenafil (REVATIO) 20 MG tablet 3-5 tabs by mouth every other day as needed 60 tablet 2  . tamsulosin (FLOMAX) 0.4 MG CAPS capsule TAKE 1 CAPSULE EVERY DAY 90 capsule 0  . testosterone cypionate (DEPOTESTOSTERONE CYPIONATE) 200 MG/ML injection Inject 0.5 mLs (100 mg total) into the muscle every 14 (fourteen) days. 6 mL 5  . tadalafil (CIALIS) 20 MG tablet Take 1 tablet (20 mg total) by mouth daily as needed for erectile dysfunction. 10 tablet 11   No current facility-administered medications on file prior to visit.    Review of Systems Constitutional: Negative for other unusual diaphoresis, sweats, appetite or weight changes HENT: Negative for other worsening hearing loss, ear pain, facial swelling, mouth sores or neck stiffness.   Eyes: Negative for other worsening pain, redness or other visual disturbance.  Respiratory: Negative for other stridor or swelling Cardiovascular: Negative for other palpitations or other chest pain  Gastrointestinal: Negative for worsening diarrhea or loose stools, blood in stool,  distention or other pain Genitourinary: Negative for hematuria, flank pain or other change in urine volume.  Musculoskeletal: Negative for myalgias or other joint swelling.  Skin: Negative for other color change, or other wound or worsening drainage.  Neurological: Negative for other syncope or numbness. Hematological: Negative for other adenopathy or swelling Psychiatric/Behavioral: Negative for hallucinations, other worsening  agitation, SI, self-injury, or new decreased concentration All other system neg per pt Objective:   Physical Exam BP 126/76   Pulse 70   Temp 97.6 F (36.4 C) (Oral)   Ht 6' (1.829 m)   Wt 202 lb (91.6 kg)   SpO2 96%   BMI 27.40 kg/m  VS noted,  Constitutional: Pt is oriented to person, place, and time. Appears well-developed and well-nourished, in no significant distress and comfortable Head: Normocephalic and atraumatic  Eyes: Conjunctivae and EOM are normal. Pupils are equal, round, and reactive to light Right Ear: External ear normal without discharge Left Ear: External ear normal without discharge Nose: Nose without discharge or deformity Mouth/Throat: Oropharynx is without other ulcerations and moist  Neck: Normal range of motion. Neck supple. No JVD present. No tracheal deviation present or significant neck LA or mass Cardiovascular: Normal rate, regular rhythm, normal heart sounds and intact distal pulses.   Pulmonary/Chest: WOB normal and breath sounds without rales or wheezing  Abdominal: Soft. Bowel sounds are normal. NT. No HSM  Musculoskeletal: Normal range of motion. Exhibits no edema Lymphadenopathy: Has no other cervical adenopathy.  Neurological: Pt is alert and oriented to person, place, and time. Pt has normal reflexes. No cranial nerve deficit. Motor grossly intact, Gait intact Skin: Skin is warm and dry. No rash noted or new ulcerations Psychiatric:  Has normal mood and affect. Behavior is normal without agitation No other exam findings    Assessment & Plan:

## 2017-08-14 NOTE — Assessment & Plan Note (Signed)
For testosterone lab f/u, cont same tx

## 2017-08-21 ENCOUNTER — Other Ambulatory Visit: Payer: Self-pay | Admitting: Internal Medicine

## 2017-09-06 DIAGNOSIS — R972 Elevated prostate specific antigen [PSA]: Secondary | ICD-10-CM | POA: Diagnosis not present

## 2017-09-19 ENCOUNTER — Encounter: Payer: Self-pay | Admitting: Gastroenterology

## 2017-09-19 DIAGNOSIS — N452 Orchitis: Secondary | ICD-10-CM | POA: Diagnosis not present

## 2017-09-19 DIAGNOSIS — N451 Epididymitis: Secondary | ICD-10-CM | POA: Diagnosis not present

## 2017-09-19 DIAGNOSIS — R102 Pelvic and perineal pain: Secondary | ICD-10-CM | POA: Diagnosis not present

## 2017-11-08 ENCOUNTER — Ambulatory Visit (AMBULATORY_SURGERY_CENTER): Payer: Self-pay | Admitting: *Deleted

## 2017-11-08 VITALS — Ht 72.0 in | Wt 203.0 lb

## 2017-11-08 DIAGNOSIS — Z1210 Encounter for screening for malignant neoplasm of intestinal tract, unspecified: Secondary | ICD-10-CM

## 2017-11-08 MED ORDER — NA SULFATE-K SULFATE-MG SULF 17.5-3.13-1.6 GM/177ML PO SOLN: 1.0000 | Freq: Once | ORAL | 0 refills | Status: AC

## 2017-11-08 NOTE — Progress Notes (Signed)
No egg or soy allergy known to patient  No issues with past sedation with any surgeries  or procedures, no intubation problems  No diet pills per patient No home 02 use per patient  No blood thinners per patient  Pt denies issues with constipation  No A fib or A flutter  EMMI video sent to pt's e mail pt declined   

## 2017-11-22 ENCOUNTER — Ambulatory Visit (AMBULATORY_SURGERY_CENTER): Payer: Medicare PPO | Admitting: Gastroenterology

## 2017-11-22 ENCOUNTER — Encounter: Payer: Self-pay | Admitting: Gastroenterology

## 2017-11-22 VITALS — BP 169/78 | HR 65 | Temp 98.4°F | Resp 12 | Wt 203.0 lb

## 2017-11-22 DIAGNOSIS — D122 Benign neoplasm of ascending colon: Secondary | ICD-10-CM

## 2017-11-22 DIAGNOSIS — I10 Essential (primary) hypertension: Secondary | ICD-10-CM | POA: Diagnosis not present

## 2017-11-22 DIAGNOSIS — Z1211 Encounter for screening for malignant neoplasm of colon: Secondary | ICD-10-CM

## 2017-11-22 MED ORDER — SODIUM CHLORIDE 0.9 % IV SOLN: 500.0000 mL | Freq: Once | INTRAVENOUS | Status: DC

## 2017-11-22 NOTE — Op Note (Signed)
[place] Patient Name: Grant Bowers Procedure Date: 11/22/2017 11:09 AM MRN: [id number] Endoscopist: Mallie Mussel L. Loletha Carrow , MD Age: 72 Referring MD:  Date of Birth: [birth year] Gender: Male Account #: [id number] Procedure:                Colonoscopy Indications:              Screening for colorectal malignant neoplasm (no                            polyps last colonoscopy 11/2006 at another                            institution) Medicines:                Monitored Anesthesia Care Procedure:                Pre-Anesthesia Assessment:                           - Prior to the procedure, a History and Physical                            was performed, and patient medications and                            allergies were reviewed. The patient's tolerance of                            previous anesthesia was also reviewed. The risks                            and benefits of the procedure and the sedation                            options and risks were discussed with the patient.                            All questions were answered, and informed consent                            was obtained. Anticoagulants: The patient has taken                            aspirin. It was decided not to withhold this                            medication prior to the procedure. ASA Grade                            Assessment: II - A patient with mild systemic                            disease. After reviewing the risks and benefits,  the patient was deemed in satisfactory condition to                            undergo the procedure.                           After obtaining informed consent, the colonoscope                            was passed under direct vision. Throughout the                            procedure, the patient's blood pressure, pulse, and                            oxygen saturations were monitored continuously. The   Colonoscope was introduced through the anus and                            advanced to the the terminal ileum, with                            identification of the appendiceal orifice and IC                            valve. The colonoscopy was performed without                            difficulty. The patient tolerated the procedure                            well. The quality of the bowel preparation was                            excellent. The ileocecal valve, appendiceal                            orifice, and rectum were photographed. Scope In: 11:23:56 AM Scope Out: 11:38:10 AM Scope Withdrawal Time: 0 hours 10 minutes 44 seconds  Total Procedure Duration: 0 hours 14 minutes 14 seconds  Findings:                 The perianal and digital rectal examinations were                            normal.                           A 2 mm polyp was found in the proximal ascending                            colon. The polyp was sessile. The polyp was removed                            with a cold snare. Resection and retrieval were  complete.                           Multiple diverticula were found in the left colon                            and right colon.                           The exam was otherwise without abnormality on                            direct and retroflexion views. Complications:            No immediate complications. Estimated Blood Loss:     Estimated blood loss was minimal. Impression:               - One 2 mm polyp in the proximal ascending colon,                            removed with a cold snare. Resected and retrieved.                           - Diverticulosis in the left colon and in the right                            colon.                           - The examination was otherwise normal on direct                            and retroflexion views. Recommendation:           - Patient has a contact number available for                             emergencies. The signs and symptoms of potential                            delayed complications were discussed with the                            patient. Return to normal activities tomorrow.                            Written discharge instructions were provided to the                            patient.                           - Resume previous diet.                           - Continue present medications.                           -  Await pathology results.                           - No repeat screening/surveillance colonoscopy due                            to age. Riyanshi Wahab L. Loletha Carrow, MD 11/22/2017 11:42:31 AM This report has been signed electronically.

## 2017-11-22 NOTE — Patient Instructions (Signed)
Impression/Recommendations:  Polyp handout given to patient. Diverticulosis handout given to patient.  Resume previous diet. Continue present medications.  YOU HAD AN ENDOSCOPIC PROCEDURE TODAY AT THE [facility]:   Refer to the procedure report that was given to you for any specific questions about what was found during the examination.  If the procedure report does not answer your questions, please call your gastroenterologist to clarify.  If you requested that your care partner not be given the details of your procedure findings, then the procedure report has been included in a sealed envelope for you to review at your convenience later.  YOU SHOULD EXPECT: Some feelings of bloating in the abdomen. Passage of more gas than usual.  Walking can help get rid of the air that was put into your GI tract during the procedure and reduce the bloating. If you had a lower endoscopy (such as a colonoscopy or flexible sigmoidoscopy) you may notice spotting of blood in your stool or on the toilet paper. If you underwent a bowel prep for your procedure, you may not have a normal bowel movement for a few days.  Please Note:  You might notice some irritation and congestion in your nose or some drainage.  This is from the oxygen used during your procedure.  There is no need for concern and it should clear up in a day or so.  SYMPTOMS TO REPORT IMMEDIATELY:   Following lower endoscopy (colonoscopy or flexible sigmoidoscopy):  Excessive amounts of blood in the stool  Significant tenderness or worsening of abdominal pains  Swelling of the abdomen that is new, acute  Fever of 100F or higher For urgent or emergent issues, a gastroenterologist can be reached at any hour by calling [phone].   DIET:  We do recommend a small meal at first, but then you may proceed to your regular diet.  Drink plenty of fluids but you should avoid alcoholic beverages for 24 hours.  ACTIVITY:  You should plan  to take it easy for the rest of today and you should NOT DRIVE or use heavy machinery until tomorrow (because of the sedation medicines used during the test).    FOLLOW UP: Our staff will call the number listed on your records the next business day following your procedure to check on you and address any questions or concerns that you may have regarding the information given to you following your procedure. If we do not reach you, we will leave a message.  However, if you are feeling well and you are not experiencing any problems, there is no need to return our call.  We will assume that you have returned to your regular daily activities without incident.  If any biopsies were taken you will be contacted by phone or by letter within the next 1-3 weeks.  Please call us at [phone] if you have not heard about the biopsies in 3 weeks.    SIGNATURES/CONFIDENTIALITY: You and/or your care partner have signed paperwork which will be entered into your electronic medical record.  These signatures attest to the fact that that the information above on your After Visit Summary has been reviewed and is understood.  Full responsibility of the confidentiality of this discharge information lies with you and/or your care-partner. 

## 2017-11-22 NOTE — Progress Notes (Signed)
Report given to PACU, vss 

## 2017-11-22 NOTE — Progress Notes (Signed)
Called to room to assist during endoscopic procedure.  Patient ID and intended procedure confirmed with present staff. Received instructions for my participation in the procedure from the performing physician.  

## 2017-11-23 ENCOUNTER — Telehealth: Payer: Self-pay

## 2017-11-23 NOTE — Telephone Encounter (Signed)
  Follow up Call-  Call back number 11/22/2017  Post procedure Call Back phone  # [phone]  Permission to leave phone message Yes  Some recent data might be hidden     Patient questions:  Do you have a fever, pain , or abdominal swelling? No. Pain Score  0 *  Have you tolerated food without any problems? Yes.    Have you been able to return to your normal activities? Yes.    Do you have any questions about your discharge instructions: Diet   No. Medications  No. Follow up visit  No.  Do you have questions or concerns about your Care? No.  Actions: * If pain score is 4 or above: No action needed, pain <4.

## 2017-11-28 ENCOUNTER — Encounter: Payer: Self-pay | Admitting: Gastroenterology

## 2018-01-07 DIAGNOSIS — R972 Elevated prostate specific antigen [PSA]: Secondary | ICD-10-CM | POA: Diagnosis not present

## 2018-01-16 ENCOUNTER — Telehealth: Payer: Self-pay | Admitting: Internal Medicine

## 2018-01-28 ENCOUNTER — Telehealth: Payer: Self-pay | Admitting: Internal Medicine

## 2018-01-28 NOTE — Telephone Encounter (Signed)
Copied from [place] [id number]. Topic: General - Other >> Jan 28, 2018  4:46 PM Yvette Rack wrote: Reason for CRM: Pt returned call to office. Pt states he received an e-mail from [facility] denying the refill request for testosterone cypionate (DEPOTESTOSTERONE CYPIONATE) 200 MG/ML injection. Pt states he just needs either Dr. Jenny Reichmann or his nursing staff to contact [facility] to approve the refill request.

## 2018-01-28 NOTE — Telephone Encounter (Signed)
Called pt, LVM.   

## 2018-01-28 NOTE — Telephone Encounter (Signed)
Patient is calling to request a update on testosterone cypionate (DEPOTESTOSTERONE CYPIONATE) 200 MG/ML injection  He stated  it was refected per pharmacy. please advise

## 2018-01-28 NOTE — Telephone Encounter (Signed)
Dr. Jenny Reichmann refilled this on 10/9 with a refill; not sure what the patient is needing. Please call to clarify.

## 2018-01-30 ENCOUNTER — Other Ambulatory Visit: Payer: Self-pay | Admitting: Family

## 2018-01-30 MED ORDER — TESTOSTERONE CYPIONATE 200 MG/ML IM SOLN: INTRAMUSCULAR | 0 refills | Status: DC

## 2018-01-30 NOTE — Telephone Encounter (Signed)
Grant Bowers pt.  Spoke to pharmacy tech at [facility] order for clarification. She stated that they received an automated message that the script had failed and a new script would follow but it never electronically resent. Can you please resend? Thank you!

## 2018-01-30 NOTE — Telephone Encounter (Signed)
Re-sent to [facility]; let our office know if he doesn't get medication as expected.

## 2018-02-14 ENCOUNTER — Telehealth: Payer: Self-pay | Admitting: Internal Medicine

## 2018-02-14 NOTE — Telephone Encounter (Signed)
Ok to let pt know that ml and mg are not the same thing  I would take the prescription as stated, in that he should take 0.5 ml every 14 days (which is 100 mg, as he is only taking Half of the 200 mg per ml strength dose)

## 2018-02-14 NOTE — Telephone Encounter (Signed)
Copied from [place] [phone]. Topic: Quick Communication - Rx Refill/Question >> Feb 14, 2018 11:44 AM Reyne Dumas L wrote: Medication:  testosterone cypionate (DEPOTESTOSTERONE CYPIONATE) 200 MG/ML injection  States he just got this from [facility].  Pt is trying to figure out if he is supposed to be taking 272ml injection or 185ml injection.  Pt states that he thinks that he is supposed to be taking a 164ml injection every 14 days, but the script says 217ml injection.  Pt is trying to determine what he is supposed to be taking and if he is supposed to be taking 1110ml does that mean he needs to switch to taking the entire 246ml vial but only injecting once a month. Pt can be reached at [phone].

## 2018-02-15 NOTE — Telephone Encounter (Signed)
Called pt, LVM.   

## 2018-02-15 NOTE — Telephone Encounter (Signed)
Per Grant Bowers- ok for [facility] to speak to patient. Information provided to patient- dosing explained per provider note

## 2018-02-19 DIAGNOSIS — H2513 Age-related nuclear cataract, bilateral: Secondary | ICD-10-CM | POA: Diagnosis not present

## 2018-02-19 DIAGNOSIS — H40013 Open angle with borderline findings, low risk, bilateral: Secondary | ICD-10-CM | POA: Diagnosis not present

## 2018-03-14 DIAGNOSIS — R972 Elevated prostate specific antigen [PSA]: Secondary | ICD-10-CM | POA: Diagnosis not present

## 2018-03-14 DIAGNOSIS — N400 Enlarged prostate without lower urinary tract symptoms: Secondary | ICD-10-CM | POA: Diagnosis not present

## 2018-03-18 ENCOUNTER — Other Ambulatory Visit: Payer: Self-pay | Admitting: Internal Medicine

## 2018-05-03 DIAGNOSIS — R3915 Urgency of urination: Secondary | ICD-10-CM | POA: Diagnosis not present

## 2018-05-03 DIAGNOSIS — N401 Enlarged prostate with lower urinary tract symptoms: Secondary | ICD-10-CM | POA: Diagnosis not present

## 2018-05-03 DIAGNOSIS — N486 Induration penis plastica: Secondary | ICD-10-CM | POA: Diagnosis not present

## 2018-05-03 DIAGNOSIS — R361 Hematospermia: Secondary | ICD-10-CM | POA: Diagnosis not present

## 2018-05-10 ENCOUNTER — Telehealth: Payer: Self-pay | Admitting: Internal Medicine

## 2018-05-10 MED ORDER — ROSUVASTATIN CALCIUM 20 MG PO TABS: 10.0000 mg | ORAL_TABLET | Freq: Every day | ORAL | 3 refills | Status: DC

## 2018-05-10 NOTE — Telephone Encounter (Signed)
Copied from [place] [id number]. Topic: Quick Communication - Rx Refill/Question >> May 10, 2018  1:52 PM Percell Belt A wrote: Medication: rosuvastatin (CRESTOR) 20 MG tablet [[id number]] - per pt Dr Jenny Reichmann told him about 3 months ago to go from 1/2 tab to 1 whole tab.  He has been taking 1 tab per day and now he is out of this med Has the patient contacted their pharmacy? Yes but needs new script with new instruction  (Agent: If no, request that the patient contact the pharmacy for the refill.) (Agent: If yes, when and what did the pharmacy advise?)  Preferred Pharmacy (with phone number or street name): [place], [place] [phone] (Phone)   Agent: Please be advised that RX refills may take up to 3 business days. We ask that you follow-up with your pharmacy.

## 2018-05-10 NOTE — Telephone Encounter (Signed)
Ok for crestor 20 qd - I sent new rx

## 2018-05-10 NOTE — Addendum Note (Signed)
Addended by: Biagio Borg on: 05/10/2018 04:06 PM   Modules accepted: Orders

## 2018-05-21 ENCOUNTER — Other Ambulatory Visit: Payer: Self-pay

## 2018-05-21 MED ORDER — ROSUVASTATIN CALCIUM 20 MG PO TABS: 20.0000 mg | ORAL_TABLET | Freq: Every day | ORAL | 3 refills | Status: DC

## 2018-05-21 NOTE — Telephone Encounter (Signed)
Copied from [place] [phone]. Topic: General - Other >> May 21, 2018 10:24 AM Carolyn Stare wrote: Pt call to say the below med was changed to 1 pill a day and when he received his RX  from [place] it still said take 1/2 pill and is asking if Dr Jenny Reichmann want him to take a 1/2 or 1 pill    rosuvastatin (CRESTOR) 20 MG tablet   [place]

## 2018-05-21 NOTE — Telephone Encounter (Signed)
Ok to take whole pill per day  New rx sent erx

## 2018-05-21 NOTE — Addendum Note (Signed)
Addended by: Biagio Borg on: 05/21/2018 12:41 PM   Modules accepted: Orders

## 2018-05-21 NOTE — Telephone Encounter (Signed)
Called pt, LVM with details below  

## 2018-05-23 MED ORDER — ROSUVASTATIN CALCIUM 20 MG PO TABS: 20.0000 mg | ORAL_TABLET | Freq: Every day | ORAL | 3 refills | Status: DC

## 2018-05-23 NOTE — Telephone Encounter (Signed)
[employer] called in to follow up on refill request. Showing Rx went to the local pharmacy. [employer] would like to have Rx sent to them instead per pt (see med refill request)

## 2018-05-23 NOTE — Addendum Note (Signed)
Addended by: Matilde Sprang on: 05/23/2018 03:33 PM   Modules accepted: Orders

## 2018-07-15 ENCOUNTER — Other Ambulatory Visit: Payer: Self-pay | Admitting: Internal Medicine

## 2018-08-19 ENCOUNTER — Encounter: Payer: Medicare PPO | Admitting: Internal Medicine

## 2018-08-27 ENCOUNTER — Other Ambulatory Visit: Payer: Self-pay | Admitting: Internal Medicine

## 2018-09-05 DIAGNOSIS — S336XXA Sprain of sacroiliac joint, initial encounter: Secondary | ICD-10-CM | POA: Diagnosis not present

## 2018-09-05 DIAGNOSIS — M9903 Segmental and somatic dysfunction of lumbar region: Secondary | ICD-10-CM | POA: Diagnosis not present

## 2018-09-12 DIAGNOSIS — R972 Elevated prostate specific antigen [PSA]: Secondary | ICD-10-CM | POA: Diagnosis not present

## 2018-10-03 DIAGNOSIS — N400 Enlarged prostate without lower urinary tract symptoms: Secondary | ICD-10-CM | POA: Diagnosis not present

## 2018-10-03 DIAGNOSIS — N5201 Erectile dysfunction due to arterial insufficiency: Secondary | ICD-10-CM | POA: Diagnosis not present

## 2018-10-03 DIAGNOSIS — R972 Elevated prostate specific antigen [PSA]: Secondary | ICD-10-CM | POA: Diagnosis not present

## 2018-10-07 ENCOUNTER — Other Ambulatory Visit: Payer: Self-pay | Admitting: Internal Medicine

## 2018-10-31 ENCOUNTER — Other Ambulatory Visit (INDEPENDENT_AMBULATORY_CARE_PROVIDER_SITE_OTHER): Payer: Medicare PPO

## 2018-10-31 ENCOUNTER — Encounter: Payer: Self-pay | Admitting: Internal Medicine

## 2018-10-31 ENCOUNTER — Other Ambulatory Visit: Payer: Self-pay

## 2018-10-31 ENCOUNTER — Ambulatory Visit (INDEPENDENT_AMBULATORY_CARE_PROVIDER_SITE_OTHER): Payer: Medicare PPO | Admitting: Internal Medicine

## 2018-10-31 VITALS — BP 126/88 | HR 77 | Temp 97.8°F | Ht 72.0 in | Wt 202.0 lb

## 2018-10-31 DIAGNOSIS — R739 Hyperglycemia, unspecified: Secondary | ICD-10-CM

## 2018-10-31 DIAGNOSIS — E538 Deficiency of other specified B group vitamins: Secondary | ICD-10-CM

## 2018-10-31 DIAGNOSIS — E559 Vitamin D deficiency, unspecified: Secondary | ICD-10-CM

## 2018-10-31 DIAGNOSIS — E291 Testicular hypofunction: Secondary | ICD-10-CM

## 2018-10-31 DIAGNOSIS — J309 Allergic rhinitis, unspecified: Secondary | ICD-10-CM | POA: Diagnosis not present

## 2018-10-31 DIAGNOSIS — Z0000 Encounter for general adult medical examination without abnormal findings: Secondary | ICD-10-CM

## 2018-10-31 DIAGNOSIS — E611 Iron deficiency: Secondary | ICD-10-CM

## 2018-10-31 DIAGNOSIS — Z23 Encounter for immunization: Secondary | ICD-10-CM | POA: Diagnosis not present

## 2018-10-31 LAB — VITAMIN B12: Vitamin B-12: 784 pg/mL (ref 211–911)

## 2018-10-31 LAB — BASIC METABOLIC PANEL
BUN: 23 mg/dL (ref 6–23)
CO2: 25 mEq/L (ref 19–32)
Calcium: 9.5 mg/dL (ref 8.4–10.5)
Chloride: 107 mEq/L (ref 96–112)
Creatinine, Ser: 1.19 mg/dL (ref 0.40–1.50)
GFR: 59.79 mL/min — ABNORMAL LOW (ref >60.00)
Glucose, Bld: 114 mg/dL — ABNORMAL HIGH (ref 70–99)
Potassium: 4.4 mEq/L (ref 3.5–5.1)
Sodium: 140 mEq/L (ref 135–145)

## 2018-10-31 LAB — URINALYSIS, ROUTINE W REFLEX MICROSCOPIC
Hgb urine dipstick: NEGATIVE
Ketones, ur: NEGATIVE
Leukocytes,Ua: NEGATIVE
Nitrite: NEGATIVE
RBC / HPF: NONE SEEN (ref 0–?)
Specific Gravity, Urine: 1.025 (ref 1.000–1.030)
Total Protein, Urine: 30 — AB
Urine Glucose: NEGATIVE
Urobilinogen, UA: 0.2 (ref 0.0–1.0)
pH: 5.5 (ref 5.0–8.0)

## 2018-10-31 LAB — HEPATIC FUNCTION PANEL
ALT: 15 U/L (ref 0–53)
AST: 23 U/L (ref 0–37)
Albumin: 4.6 g/dL (ref 3.5–5.2)
Alkaline Phosphatase: 78 U/L (ref 39–117)
Bilirubin, Direct: 0.2 mg/dL (ref 0.0–0.3)
Total Bilirubin: 0.9 mg/dL (ref 0.2–1.2)
Total Protein: 6.4 g/dL (ref 6.0–8.3)

## 2018-10-31 LAB — CBC WITH DIFFERENTIAL/PLATELET
Basophils Absolute: 0 10*3/uL (ref 0.0–0.1)
Basophils Relative: 0.5 % (ref 0.0–3.0)
Eosinophils Absolute: 0.2 10*3/uL (ref 0.0–0.7)
Eosinophils Relative: 3.1 % (ref 0.0–5.0)
HCT: 41.1 % (ref 39.0–52.0)
Hemoglobin: 14.5 g/dL (ref 13.0–17.0)
Lymphocytes Relative: 22.8 % (ref 12.0–46.0)
Lymphs Abs: 1.2 10*3/uL (ref 0.7–4.0)
MCHC: 35.3 g/dL (ref 30.0–36.0)
MCV: 85.8 fl (ref 78.0–100.0)
Monocytes Absolute: 0.5 10*3/uL (ref 0.1–1.0)
Monocytes Relative: 9.8 % (ref 3.0–12.0)
Neutro Abs: 3.3 10*3/uL (ref 1.4–7.7)
Neutrophils Relative %: 63.8 % (ref 43.0–77.0)
Platelets: 229 10*3/uL (ref 150.0–400.0)
RBC: 4.79 Mil/uL (ref 4.22–5.81)
RDW: 13.7 % (ref 11.5–15.5)
WBC: 5.2 10*3/uL (ref 4.0–10.5)

## 2018-10-31 LAB — TSH: TSH: 1.18 u[IU]/mL (ref 0.35–4.50)

## 2018-10-31 LAB — IBC PANEL
Iron: 81 ug/dL (ref 42–165)
Saturation Ratios: 28.2 % (ref 20.0–50.0)
Transferrin: 205 mg/dL — ABNORMAL LOW (ref 212.0–360.0)

## 2018-10-31 LAB — LIPID PANEL
Cholesterol: 124 mg/dL (ref 0–200)
HDL: 44 mg/dL (ref >39.00)
LDL Cholesterol: 67 mg/dL (ref 0–99)
NonHDL: 80.03
Total CHOL/HDL Ratio: 3
Triglycerides: 66 mg/dL (ref 0.0–149.0)
VLDL: 13.2 mg/dL (ref 0.0–40.0)

## 2018-10-31 LAB — HEMOGLOBIN A1C: Hgb A1c MFr Bld: 5.8 % (ref 4.6–6.5)

## 2018-10-31 LAB — VITAMIN D 25 HYDROXY (VIT D DEFICIENCY, FRACTURES): VITD: 48.7 ng/mL (ref 30.00–100.00)

## 2018-10-31 LAB — TESTOSTERONE: Testosterone: 201.29 ng/dL — ABNORMAL LOW (ref 300.00–890.00)

## 2018-10-31 MED ORDER — CETIRIZINE HCL 10 MG PO TABS: 10.0000 mg | ORAL_TABLET | Freq: Every day | ORAL | 3 refills | Status: DC

## 2018-10-31 NOTE — Assessment & Plan Note (Signed)

## 2018-10-31 NOTE — Assessment & Plan Note (Signed)
stable overall by history and exam, recent data reviewed with pt, and pt to continue medical treatment as before,  to f/u any worsening symptoms or concerns  

## 2018-10-31 NOTE — Assessment & Plan Note (Addendum)
Mild to mod, for zyrtec asd prn,  to f/u any worsening symptoms or concerns  In addition to the time spent performing CPE, I spent an additional 15 minutes face to face,in which greater than 50% of this time was spent in counseling and coordination of care for patient's illness as documented, including the differential dx, treatment, further evaluation and other management of allergic rhinitis, hyperglycemia and hypogonadism

## 2018-10-31 NOTE — Assessment & Plan Note (Signed)
For testosterone level 

## 2018-10-31 NOTE — Patient Instructions (Addendum)
You had the Tdap tetanus shot today  Please take all new medication as prescribed - the zyrtec for the allergies  Please continue all other medications as before, and refills have been done if requested.  Please have the pharmacy call with any other refills you may need.  Please continue your efforts at being more active, low cholesterol diet, and weight control.  You are otherwise up to date with prevention measures today.  Please keep your appointments with your specialists as you may have planned  Please go to the LAB in the Basement (turn left off the elevator) for the tests to be done today  You will be contacted by phone if any changes need to be made immediately.  Otherwise, you will receive a letter about your results with an explanation, but please check with MyChart first.  Please remember to sign up for MyChart if you have not done so, as this will be important to you in the future with finding out test results, communicating by private email, and scheduling acute appointments online when needed.  Please return in 1 year for your yearly visit, or sooner if needed, with Lab testing done 3-5 days before

## 2018-10-31 NOTE — Progress Notes (Signed)
Subjective:    Patient ID: Grant Bowers, male    DOB: 1946/02/24, 73 y.o.   MRN: [id number]  HPI  Here for wellness and f/u;  Overall doing ok;  Pt denies Chest pain, worsening SOB, DOE, wheezing, orthopnea, PND, worsening LE edema, palpitations, dizziness or syncope.  Pt denies neurological change such as new headache, facial or extremity weakness.  Pt denies polydipsia, polyuria, or low sugar symptoms. Pt states overall good compliance with treatment and medications, good tolerability, and has been trying to follow appropriate diet.  Pt denies worsening depressive symptoms, suicidal ideation or panic. No fever, night sweats, wt loss, loss of appetite, or other constitutional symptoms.  Pt states good ability with ADL's, has low fall risk, home safety reviewed and adequate, no other significant changes in hearing or vision, and only occasionally active with exercise.  Near retired in [employer]in [employer], will lose about $50K this yr due to the pandemic.  Playing golf about 3-4 times per wk. Gets dizzy with standing up quickly with golfing in the heat.  Denies urinary symptoms such as dysuria, frequency, urgency, flank pain, hematuria or n/v, fever, chills.  Has seen Urology Dr Karsten Ro about 2-3 wks ago with PSA about 5.4, felt to be consistent and stable.   Does have several wks ongoing nasal allergy symptoms with clearish congestion, itch and sneezing, without fever, pain, ST, cough, swelling or wheezing. Past Medical History:  Diagnosis Date  . Allergy   . Carotid artery stenosis 09/23/2015  . GERD 04/08/2008  . Heart murmur    as a child only   . Hyperlipidemia    om meds and borderline numbers with labs   . HYPERTENSION 04/08/2008  . 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 PSA, INCREASED 04/08/2008  . 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 collasped lung    . COLONOSCOPY  12/07/2006   [facility] in [place]   . KIDNEY STONE SURGERY      reports that he has  never smoked. He has never used smokeless tobacco. He reports current alcohol use. He reports that he does not use drugs. family history includes Colon polyps in his brother; Pancreatic cancer in his mother. Allergies  Allergen Reactions  . Atorvastatin Other (See Comments)    Pt denies this allergy    Current Outpatient Medications on File Prior to Visit  Medication Sig Dispense Refill  . amLODipine (NORVASC) 10 MG tablet TAKE 1 TABLET EVERY DAY 90 tablet 2  . aspirin 81 MG tablet Take 81 mg by mouth daily.      . fluticasone (FLONASE) 50 MCG/ACT nasal spray USE 2 SPRAYS IN EACH NOSTRIL EVERY DAY 48 g 3  . irbesartan (AVAPRO) 300 MG tablet TAKE 1 TABLET AT BEDTIME 90 tablet 2  . labetalol (NORMODYNE) 300 MG tablet TAKE 1 TABLET TWICE DAILY  180 tablet 2  . omeprazole (PRILOSEC) 10 MG capsule TAKE 1 CAPSULE EVERY DAY 90 capsule 2  . rosuvastatin (CRESTOR) 20 MG tablet Take 1 tablet (20 mg total) by mouth daily. 90 tablet 3  . sildenafil (REVATIO) 20 MG tablet 3-5 tabs by mouth every other day as needed 60 tablet 2  . tadalafil (CIALIS) 20 MG tablet TAKE 1 TABLET BY MOUTH ONCE DAILY AS NEEDED FOR  ERECTILE  DYSFUNCTION 30 tablet 0  . tamsulosin (FLOMAX) 0.4 MG CAPS capsule TAKE 1 CAPSULE EVERY DAY 90 capsule 1  . testosterone cypionate (DEPOTESTOSTERONE CYPIONATE) 200 MG/ML injection INJECT 0.5 ML (100  MG TOTAL) INTO THE MUSCLE EVERY 14 (FOURTEEN) DAYS. (SINGLE USE VIAL ONLY) 6 mL 0   No current facility-administered medications on file prior to visit.    Review of Systems Constitutional: Negative for other unusual diaphoresis, sweats, appetite or weight changes HENT: Negative for other worsening hearing loss, ear pain, facial swelling, mouth sores or neck stiffness.   Eyes: Negative for other worsening pain, redness or other visual disturbance.  Respiratory: Negative for other stridor or swelling Cardiovascular: Negative for other palpitations or other chest pain  Gastrointestinal:  Negative for worsening diarrhea or loose stools, blood in stool, distention or other pain Genitourinary: Negative for hematuria, flank pain or other change in urine volume.  Musculoskeletal: Negative for myalgias or other joint swelling.  Skin: Negative for other color change, or other wound or worsening drainage.  Neurological: Negative for other syncope or numbness. Hematological: Negative for other adenopathy or swelling Psychiatric/Behavioral: Negative for hallucinations, other worsening agitation, SI, self-injury, or new decreased concentration All other system neg per pt    Objective:   Physical Exam BP 126/88   Pulse 77   Temp 97.8 F (36.6 C) (Oral)   Ht 6' (1.829 m)   Wt 202 lb (91.6 kg)   SpO2 96%   BMI 27.40 kg/m  VS noted,  Constitutional: Pt is oriented to person, place, and time. Appears well-developed and well-nourished, in no significant distress and comfortable Head: Normocephalic and atraumatic  Bilat tm's with mild erythema.  Max sinus areas non tender.  Pharynx with mild erythema, no exudate Eyes: Conjunctivae and EOM are normal. Pupils are equal, round, and reactive to light Right Ear: External ear normal without discharge Left Ear: External ear normal without discharge Nose: Nose without discharge or deformity Mouth/Throat: Oropharynx is without other ulcerations and moist  Neck: Normal range of motion. Neck supple. No JVD present. No tracheal deviation present or significant neck LA or mass Cardiovascular: Normal rate, regular rhythm, normal heart sounds and intact distal pulses.   Pulmonary/Chest: WOB normal and breath sounds without rales or wheezing  Abdominal: Soft. Bowel sounds are normal. NT. No HSM  Musculoskeletal: Normal range of motion. Exhibits no edema Lymphadenopathy: Has no other cervical adenopathy.  Neurological: Pt is alert and oriented to person, place, and time. Pt has normal reflexes. No cranial nerve deficit. Motor grossly intact, Gait  intact Skin: Skin is warm and dry. No rash noted or new ulcerations Psychiatric:  Has normal mood and affect. Behavior is normal without agitation No other exam findings Lab Results  Component Value Date   WBC 5.2 10/31/2018   HGB 14.5 10/31/2018   HCT 41.1 10/31/2018   PLT 229.0 10/31/2018   GLUCOSE 114 (H) 10/31/2018   CHOL 124 10/31/2018   TRIG 66.0 10/31/2018   HDL 44.00 10/31/2018   LDLDIRECT 150.3 02/11/2010   LDLCALC 67 10/31/2018   ALT 15 10/31/2018   AST 23 10/31/2018   NA 140 10/31/2018   K 4.4 10/31/2018   CL 107 10/31/2018   CREATININE 1.19 10/31/2018   BUN 23 10/31/2018   CO2 25 10/31/2018   TSH 1.18 10/31/2018   PSA 7.50 (H) 08/14/2017   INR 1.2 ratio (H) 02/15/2010   HGBA1C 5.8 10/31/2018       Assessment & Plan:

## 2018-12-09 ENCOUNTER — Other Ambulatory Visit: Payer: Self-pay | Admitting: Internal Medicine

## 2018-12-17 DIAGNOSIS — Z136 Encounter for screening for cardiovascular disorders: Secondary | ICD-10-CM | POA: Diagnosis not present

## 2018-12-17 DIAGNOSIS — R9431 Abnormal electrocardiogram [ECG] [EKG]: Secondary | ICD-10-CM | POA: Diagnosis not present

## 2018-12-17 DIAGNOSIS — E785 Hyperlipidemia, unspecified: Secondary | ICD-10-CM | POA: Diagnosis not present

## 2018-12-17 DIAGNOSIS — I10 Essential (primary) hypertension: Secondary | ICD-10-CM | POA: Diagnosis not present

## 2019-01-13 DIAGNOSIS — R9431 Abnormal electrocardiogram [ECG] [EKG]: Secondary | ICD-10-CM | POA: Diagnosis not present

## 2019-01-13 DIAGNOSIS — G4730 Sleep apnea, unspecified: Secondary | ICD-10-CM | POA: Diagnosis not present

## 2019-01-13 DIAGNOSIS — I10 Essential (primary) hypertension: Secondary | ICD-10-CM | POA: Diagnosis not present

## 2019-01-27 ENCOUNTER — Telehealth: Payer: Self-pay | Admitting: Internal Medicine

## 2019-01-27 NOTE — Telephone Encounter (Signed)
Patient is calling to see if EKG completed by Dr. Jenny Reichmann can be sent Dr. Pablo Ledger [place], [place]

## 2019-01-28 NOTE — Telephone Encounter (Signed)
Gave pt medical records number

## 2019-02-05 DIAGNOSIS — G4733 Obstructive sleep apnea (adult) (pediatric): Secondary | ICD-10-CM | POA: Diagnosis not present

## 2019-02-10 DIAGNOSIS — D485 Neoplasm of uncertain behavior of skin: Secondary | ICD-10-CM | POA: Diagnosis not present

## 2019-02-10 DIAGNOSIS — D2272 Melanocytic nevi of left lower limb, including hip: Secondary | ICD-10-CM | POA: Diagnosis not present

## 2019-02-10 DIAGNOSIS — L814 Other melanin hyperpigmentation: Secondary | ICD-10-CM | POA: Diagnosis not present

## 2019-02-10 DIAGNOSIS — D225 Melanocytic nevi of trunk: Secondary | ICD-10-CM | POA: Diagnosis not present

## 2019-02-10 DIAGNOSIS — C44712 Basal cell carcinoma of skin of right lower limb, including hip: Secondary | ICD-10-CM | POA: Diagnosis not present

## 2019-02-10 DIAGNOSIS — L718 Other rosacea: Secondary | ICD-10-CM | POA: Diagnosis not present

## 2019-02-10 DIAGNOSIS — D2271 Melanocytic nevi of right lower limb, including hip: Secondary | ICD-10-CM | POA: Diagnosis not present

## 2019-02-10 DIAGNOSIS — C44729 Squamous cell carcinoma of skin of left lower limb, including hip: Secondary | ICD-10-CM | POA: Diagnosis not present

## 2019-02-10 DIAGNOSIS — L821 Other seborrheic keratosis: Secondary | ICD-10-CM | POA: Diagnosis not present

## 2019-02-10 DIAGNOSIS — L708 Other acne: Secondary | ICD-10-CM | POA: Diagnosis not present

## 2019-02-20 DIAGNOSIS — Z1159 Encounter for screening for other viral diseases: Secondary | ICD-10-CM | POA: Diagnosis not present

## 2019-02-23 DIAGNOSIS — G4733 Obstructive sleep apnea (adult) (pediatric): Secondary | ICD-10-CM | POA: Diagnosis not present

## 2019-02-24 DIAGNOSIS — C44729 Squamous cell carcinoma of skin of left lower limb, including hip: Secondary | ICD-10-CM | POA: Diagnosis not present

## 2019-02-24 DIAGNOSIS — C44712 Basal cell carcinoma of skin of right lower limb, including hip: Secondary | ICD-10-CM | POA: Diagnosis not present

## 2019-02-28 DIAGNOSIS — M9902 Segmental and somatic dysfunction of thoracic region: Secondary | ICD-10-CM | POA: Diagnosis not present

## 2019-02-28 DIAGNOSIS — M9901 Segmental and somatic dysfunction of cervical region: Secondary | ICD-10-CM | POA: Diagnosis not present

## 2019-02-28 DIAGNOSIS — S338XXA Sprain of other parts of lumbar spine and pelvis, initial encounter: Secondary | ICD-10-CM | POA: Diagnosis not present

## 2019-02-28 DIAGNOSIS — M9903 Segmental and somatic dysfunction of lumbar region: Secondary | ICD-10-CM | POA: Diagnosis not present

## 2019-02-28 DIAGNOSIS — S134XXA Sprain of ligaments of cervical spine, initial encounter: Secondary | ICD-10-CM | POA: Diagnosis not present

## 2019-02-28 DIAGNOSIS — S233XXA Sprain of ligaments of thoracic spine, initial encounter: Secondary | ICD-10-CM | POA: Diagnosis not present

## 2019-03-19 DIAGNOSIS — G4733 Obstructive sleep apnea (adult) (pediatric): Secondary | ICD-10-CM | POA: Diagnosis not present

## 2019-03-27 DIAGNOSIS — R972 Elevated prostate specific antigen [PSA]: Secondary | ICD-10-CM | POA: Diagnosis not present

## 2019-04-19 DIAGNOSIS — G4733 Obstructive sleep apnea (adult) (pediatric): Secondary | ICD-10-CM | POA: Diagnosis not present

## 2019-04-24 DIAGNOSIS — I10 Essential (primary) hypertension: Secondary | ICD-10-CM | POA: Diagnosis not present

## 2019-04-24 DIAGNOSIS — G4700 Insomnia, unspecified: Secondary | ICD-10-CM | POA: Diagnosis not present

## 2019-04-24 DIAGNOSIS — G4733 Obstructive sleep apnea (adult) (pediatric): Secondary | ICD-10-CM | POA: Diagnosis not present

## 2019-04-24 DIAGNOSIS — R0681 Apnea, not elsewhere classified: Secondary | ICD-10-CM | POA: Diagnosis not present

## 2019-04-25 ENCOUNTER — Other Ambulatory Visit: Payer: Self-pay | Admitting: Internal Medicine

## 2019-05-19 DIAGNOSIS — Z1159 Encounter for screening for other viral diseases: Secondary | ICD-10-CM | POA: Diagnosis not present

## 2019-05-20 DIAGNOSIS — Z136 Encounter for screening for cardiovascular disorders: Secondary | ICD-10-CM | POA: Diagnosis not present

## 2019-05-20 DIAGNOSIS — G4733 Obstructive sleep apnea (adult) (pediatric): Secondary | ICD-10-CM | POA: Diagnosis not present

## 2019-05-20 DIAGNOSIS — E785 Hyperlipidemia, unspecified: Secondary | ICD-10-CM | POA: Diagnosis not present

## 2019-05-20 DIAGNOSIS — I10 Essential (primary) hypertension: Secondary | ICD-10-CM | POA: Diagnosis not present

## 2019-05-20 DIAGNOSIS — R9431 Abnormal electrocardiogram [ECG] [EKG]: Secondary | ICD-10-CM | POA: Diagnosis not present

## 2019-05-22 DIAGNOSIS — G4731 Primary central sleep apnea: Secondary | ICD-10-CM | POA: Diagnosis not present

## 2019-06-17 DIAGNOSIS — Z136 Encounter for screening for cardiovascular disorders: Secondary | ICD-10-CM | POA: Diagnosis not present

## 2019-06-17 DIAGNOSIS — E785 Hyperlipidemia, unspecified: Secondary | ICD-10-CM | POA: Diagnosis not present

## 2019-06-17 DIAGNOSIS — I10 Essential (primary) hypertension: Secondary | ICD-10-CM | POA: Diagnosis not present

## 2019-06-17 DIAGNOSIS — G4733 Obstructive sleep apnea (adult) (pediatric): Secondary | ICD-10-CM | POA: Diagnosis not present

## 2019-06-17 DIAGNOSIS — R9431 Abnormal electrocardiogram [ECG] [EKG]: Secondary | ICD-10-CM | POA: Diagnosis not present

## 2019-06-19 DIAGNOSIS — G4733 Obstructive sleep apnea (adult) (pediatric): Secondary | ICD-10-CM | POA: Diagnosis not present

## 2019-06-20 DIAGNOSIS — E785 Hyperlipidemia, unspecified: Secondary | ICD-10-CM | POA: Diagnosis not present

## 2019-06-20 DIAGNOSIS — G4731 Primary central sleep apnea: Secondary | ICD-10-CM | POA: Diagnosis not present

## 2019-06-20 DIAGNOSIS — I10 Essential (primary) hypertension: Secondary | ICD-10-CM | POA: Diagnosis not present

## 2019-06-26 DIAGNOSIS — R1031 Right lower quadrant pain: Secondary | ICD-10-CM | POA: Diagnosis not present

## 2019-07-09 ENCOUNTER — Other Ambulatory Visit: Payer: Self-pay | Admitting: Internal Medicine

## 2019-07-18 DIAGNOSIS — G4733 Obstructive sleep apnea (adult) (pediatric): Secondary | ICD-10-CM | POA: Diagnosis not present

## 2019-07-20 DIAGNOSIS — G4731 Primary central sleep apnea: Secondary | ICD-10-CM | POA: Diagnosis not present

## 2019-08-17 DIAGNOSIS — G4733 Obstructive sleep apnea (adult) (pediatric): Secondary | ICD-10-CM | POA: Diagnosis not present

## 2019-08-19 DIAGNOSIS — G4733 Obstructive sleep apnea (adult) (pediatric): Secondary | ICD-10-CM | POA: Diagnosis not present

## 2019-08-26 DIAGNOSIS — I10 Essential (primary) hypertension: Secondary | ICD-10-CM | POA: Diagnosis not present

## 2019-08-26 DIAGNOSIS — G4700 Insomnia, unspecified: Secondary | ICD-10-CM | POA: Diagnosis not present

## 2019-08-26 DIAGNOSIS — G4731 Primary central sleep apnea: Secondary | ICD-10-CM | POA: Diagnosis not present

## 2019-09-01 ENCOUNTER — Telehealth: Payer: Self-pay | Admitting: Internal Medicine

## 2019-09-01 NOTE — Telephone Encounter (Signed)
PATIENT DID NOT WANT TO DO A AWV WITH OUR [facility] IF HE WAS GOING TO GET BILL FOR IT SINCE HE JUST HAD IT DONE WITH HIS INS TWO WEEKS A GO. INFORMED PATIENT THAT I WAS NOT SURE IF HE WOULD GET BILLED OR NOT. STATED HE WILL CALL THE BILLING OFFICE HIS SELF.

## 2019-09-17 DIAGNOSIS — G4733 Obstructive sleep apnea (adult) (pediatric): Secondary | ICD-10-CM | POA: Diagnosis not present

## 2019-09-18 DIAGNOSIS — G4731 Primary central sleep apnea: Secondary | ICD-10-CM | POA: Diagnosis not present

## 2019-09-19 DIAGNOSIS — G4731 Primary central sleep apnea: Secondary | ICD-10-CM | POA: Diagnosis not present

## 2019-09-25 ENCOUNTER — Other Ambulatory Visit: Payer: Self-pay | Admitting: Internal Medicine

## 2019-09-30 DIAGNOSIS — L810 Postinflammatory hyperpigmentation: Secondary | ICD-10-CM | POA: Diagnosis not present

## 2019-09-30 DIAGNOSIS — D224 Melanocytic nevi of scalp and neck: Secondary | ICD-10-CM | POA: Diagnosis not present

## 2019-09-30 DIAGNOSIS — C44712 Basal cell carcinoma of skin of right lower limb, including hip: Secondary | ICD-10-CM | POA: Diagnosis not present

## 2019-09-30 DIAGNOSIS — L570 Actinic keratosis: Secondary | ICD-10-CM | POA: Diagnosis not present

## 2019-09-30 DIAGNOSIS — L821 Other seborrheic keratosis: Secondary | ICD-10-CM | POA: Diagnosis not present

## 2019-09-30 DIAGNOSIS — C44729 Squamous cell carcinoma of skin of left lower limb, including hip: Secondary | ICD-10-CM | POA: Diagnosis not present

## 2019-10-06 ENCOUNTER — Other Ambulatory Visit: Payer: Self-pay | Admitting: Internal Medicine

## 2019-10-06 DIAGNOSIS — R972 Elevated prostate specific antigen [PSA]: Secondary | ICD-10-CM | POA: Diagnosis not present

## 2019-10-06 NOTE — Telephone Encounter (Signed)
Please refill as per office routine med refill policy (all routine meds refilled for 3 mo or monthly per pt preference up to one year from last visit, then month to month grace period for 3 mo, then further med refills will have to be denied)  

## 2019-10-07 DIAGNOSIS — H2513 Age-related nuclear cataract, bilateral: Secondary | ICD-10-CM | POA: Diagnosis not present

## 2019-10-07 DIAGNOSIS — H40013 Open angle with borderline findings, low risk, bilateral: Secondary | ICD-10-CM | POA: Diagnosis not present

## 2019-10-17 DIAGNOSIS — G4733 Obstructive sleep apnea (adult) (pediatric): Secondary | ICD-10-CM | POA: Diagnosis not present

## 2019-10-19 DIAGNOSIS — G4731 Primary central sleep apnea: Secondary | ICD-10-CM | POA: Diagnosis not present

## 2019-10-23 ENCOUNTER — Other Ambulatory Visit: Payer: Self-pay | Admitting: Internal Medicine

## 2019-10-23 NOTE — Telephone Encounter (Signed)
Please refill as per office routine med refill policy (all routine meds refilled for 3 mo or monthly per pt preference up to one year from last visit, then month to month grace period for 3 mo, then further med refills will have to be denied)  

## 2019-10-30 DIAGNOSIS — R3915 Urgency of urination: Secondary | ICD-10-CM | POA: Diagnosis not present

## 2019-10-30 DIAGNOSIS — R972 Elevated prostate specific antigen [PSA]: Secondary | ICD-10-CM | POA: Diagnosis not present

## 2019-10-30 DIAGNOSIS — N400 Enlarged prostate without lower urinary tract symptoms: Secondary | ICD-10-CM | POA: Diagnosis not present

## 2019-11-03 ENCOUNTER — Other Ambulatory Visit: Payer: Self-pay | Admitting: Internal Medicine

## 2019-11-03 NOTE — Telephone Encounter (Signed)
Please refill as per office routine med refill policy (all routine meds refilled for 3 mo or monthly per pt preference up to one year from last visit, then month to month grace period for 3 mo, then further med refills will have to be denied)  

## 2019-11-06 ENCOUNTER — Other Ambulatory Visit: Payer: Self-pay

## 2019-11-06 ENCOUNTER — Ambulatory Visit (INDEPENDENT_AMBULATORY_CARE_PROVIDER_SITE_OTHER): Payer: Medicare PPO | Admitting: Internal Medicine

## 2019-11-06 ENCOUNTER — Encounter: Payer: Self-pay | Admitting: Internal Medicine

## 2019-11-06 VITALS — BP 140/60 | HR 61 | Temp 97.1°F | Ht 72.0 in | Wt 205.0 lb

## 2019-11-06 DIAGNOSIS — Z0000 Encounter for general adult medical examination without abnormal findings: Secondary | ICD-10-CM

## 2019-11-06 DIAGNOSIS — R739 Hyperglycemia, unspecified: Secondary | ICD-10-CM | POA: Diagnosis not present

## 2019-11-06 LAB — COMPLETE METABOLIC PANEL WITH GFR
AG Ratio: 2.3 (calc) (ref 1.0–2.5)
ALT: 16 U/L (ref 9–46)
AST: 22 U/L (ref 10–35)
Albumin: 4.3 g/dL (ref 3.6–5.1)
Alkaline phosphatase (APISO): 74 U/L (ref 35–144)
BUN/Creatinine Ratio: 17 (calc) (ref 6–22)
BUN: 21 mg/dL (ref 7–25)
CO2: 25 mmol/L (ref 20–32)
Calcium: 9.1 mg/dL (ref 8.6–10.3)
Chloride: 109 mmol/L (ref 98–110)
Creat: 1.23 mg/dL — ABNORMAL HIGH (ref 0.70–1.18)
GFR, Est African American: 67 mL/min/{1.73_m2} (ref >=60)
GFR, Est Non African American: 57 mL/min/{1.73_m2} — ABNORMAL LOW (ref >=60)
Globulin: 1.9 g/dL (calc) (ref 1.9–3.7)
Glucose, Bld: 136 mg/dL — ABNORMAL HIGH (ref 65–99)
Potassium: 4.7 mmol/L (ref 3.5–5.3)
Sodium: 142 mmol/L (ref 135–146)
Total Bilirubin: 0.7 mg/dL (ref 0.2–1.2)
Total Protein: 6.2 g/dL (ref 6.1–8.1)

## 2019-11-06 NOTE — Patient Instructions (Signed)

## 2019-11-06 NOTE — Progress Notes (Addendum)
Subjective:    Patient ID: Grant Bowers, male    DOB: 02-Dec-1945, 74 y.o.   MRN: [id number]  HPI  Here for wellness and f/u;  Overall doing ok;  Pt denies Chest pain, worsening SOB, DOE, wheezing, orthopnea, PND, worsening LE edema, palpitations, dizziness or syncope.  Pt denies neurological change such as new headache, facial or extremity weakness.  Pt denies polydipsia, polyuria, or low sugar symptoms. Pt states overall good compliance with treatment and medications, good tolerability, and has been trying to follow appropriate diet.  Pt denies worsening depressive symptoms, suicidal ideation or panic. No fever, night sweats, wt loss, loss of appetite, or other constitutional symptoms.  Pt states good ability with ADL's, has low fall risk, home safety reviewed and adequate, no other significant changes in hearing or vision, and only occasionally active with exercise. Wt Readings from Last 3 Encounters:  11/06/19 (!) 205 lb (93 kg)  10/31/18 202 lb (91.6 kg)  11/22/17 203 lb (92.1 kg)  most recent psa 4.8 with urology per pt Past Medical History:  Diagnosis Date  . Allergy   . Carotid artery stenosis 09/23/2015  . GERD 04/08/2008  . Heart murmur    as a child only   . Hyperlipidemia    om meds and borderline numbers with labs   . HYPERTENSION 04/08/2008  . 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 PSA, INCREASED 04/08/2008  . 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 collasped lung    . COLONOSCOPY  12/07/2006   [facility] in [place]   . KIDNEY STONE SURGERY      reports that he has never smoked. He has never used smokeless tobacco. He reports current alcohol use. He reports that he does not use drugs. family history includes Colon polyps in his brother; Pancreatic cancer in his mother. Allergies  Allergen Reactions  . Atorvastatin Other (See Comments)    Pt denies this allergy    Current Outpatient Medications on File Prior to Visit  Medication  Sig Dispense Refill  . amLODipine (NORVASC) 10 MG tablet TAKE 1 TABLET EVERY DAY 90 tablet 1  . aspirin 81 MG tablet Take 81 mg by mouth daily.      . cetirizine (ZYRTEC) 10 MG tablet TAKE 1 TABLET EVERY DAY 90 tablet 3  . fluticasone (FLONASE) 50 MCG/ACT nasal spray USE 2 SPRAYS IN EACH NOSTRIL EVERY DAY 48 g 3  . irbesartan (AVAPRO) 300 MG tablet TAKE 1 TABLET AT BEDTIME 90 tablet 1  . labetalol (NORMODYNE) 300 MG tablet TAKE 1 TABLET TWICE DAILY  180 tablet 1  . omeprazole (PRILOSEC) 10 MG capsule TAKE 1 CAPSULE EVERY DAY 90 capsule 1  . rosuvastatin (CRESTOR) 20 MG tablet TAKE 1 TABLET EVERY DAY - ANNUAL APPOINTMENT DUE IN JULY MUST SEE PROVIDER FOR FURTHER REFILLS 90 tablet 0  . sildenafil (REVATIO) 20 MG tablet 3-5 tabs by mouth every other day as needed 60 tablet 2  . tadalafil (CIALIS) 20 MG tablet TAKE 1 TABLET BY MOUTH ONCE DAILY AS NEEDED FOR  ERECTILE  DYSFUNCTION 30 tablet 0  . tamsulosin (FLOMAX) 0.4 MG CAPS capsule TAKE 1 CAPSULE EVERY DAY 90 capsule 3  . testosterone cypionate (DEPOTESTOSTERONE CYPIONATE) 200 MG/ML injection INJECT 0.5 ML (100 MG TOTAL) INTO THE MUSCLE EVERY 14 (FOURTEEN) DAYS. (SINGLE USE VIAL ONLY) 6 mL 0   No current facility-administered medications on file prior to visit.   Review of Systems All otherwise neg per pt  Objective:   Physical Exam BP (!) 140/60 (BP Location: Left Arm, Patient Position: Sitting, Cuff Size: Large)   Pulse 61   Temp (!) 97.1 F (36.2 C) (Oral)   Ht 6' (1.829 m)   Wt (!) 205 lb (93 kg)   SpO2 96%   BMI 27.80 kg/m  VS noted,  Constitutional: Pt appears in NAD HENT: Head: NCAT.  Right Ear: External ear normal.  Left Ear: External ear normal.  Eyes: . Pupils are equal, round, and reactive to light. Conjunctivae and EOM are normal Nose: without d/c or deformity Neck: Neck supple. Gross normal ROM Cardiovascular: Normal rate and regular rhythm.   Pulmonary/Chest: Effort normal and breath sounds without rales or  wheezing.  Abd:  Soft, NT, ND, + BS, no organomegaly Neurological: Pt is alert. At baseline orientation, motor grossly intact Skin: Skin is warm. No rashes, other new lesions, no LE edema Psychiatric: Pt behavior is normal without agitation ] All otherwise neg per pt Lab Results  Component Value Date   WBC 5.4 11/06/2019   HGB 14.4 11/06/2019   HCT 42.5 11/06/2019   PLT 218 11/06/2019   GLUCOSE 136 (H) 11/06/2019   CHOL 114 11/06/2019   TRIG 39 11/06/2019   HDL 46 11/06/2019   LDLDIRECT 150.3 02/11/2010   LDLCALC 57 11/06/2019   ALT 16 11/06/2019   AST 22 11/06/2019   NA 142 11/06/2019   K 4.7 11/06/2019   CL 109 11/06/2019   CREATININE 1.23 (H) 11/06/2019   BUN 21 11/06/2019   CO2 25 11/06/2019   TSH 1.07 11/06/2019   PSA 7.50 (H) 08/14/2017   INR 1.2 ratio (H) 02/15/2010   HGBA1C 5.7 (H) 11/06/2019      Assessment & Plan:

## 2019-11-07 ENCOUNTER — Encounter: Payer: Self-pay | Admitting: Internal Medicine

## 2019-11-07 LAB — CBC WITH DIFFERENTIAL/PLATELET
Absolute Monocytes: 481 cells/uL (ref 200–950)
Basophils Absolute: 22 cells/uL (ref 0–200)
Basophils Relative: 0.4 %
Eosinophils Absolute: 221 cells/uL (ref 15–500)
Eosinophils Relative: 4.1 %
HCT: 42.5 % (ref 38.5–50.0)
Hemoglobin: 14.4 g/dL (ref 13.2–17.1)
Lymphs Abs: 1296 cells/uL (ref 850–3900)
MCH: 30.3 pg (ref 27.0–33.0)
MCHC: 33.9 g/dL (ref 32.0–36.0)
MCV: 89.5 fL (ref 80.0–100.0)
MPV: 10.5 fL (ref 7.5–12.5)
Monocytes Relative: 8.9 %
Neutro Abs: 3380 cells/uL (ref 1500–7800)
Neutrophils Relative %: 62.6 %
Platelets: 218 10*3/uL (ref 140–400)
RBC: 4.75 10*6/uL (ref 4.20–5.80)
RDW: 13.1 % (ref 11.0–15.0)
Total Lymphocyte: 24 %
WBC: 5.4 10*3/uL (ref 3.8–10.8)

## 2019-11-07 LAB — LIPID PANEL
Cholesterol: 114 mg/dL (ref <200)
HDL: 46 mg/dL (ref >=40)
LDL Cholesterol (Calc): 57 mg/dL (calc)
Non-HDL Cholesterol (Calc): 68 mg/dL (calc) (ref <130)
Total CHOL/HDL Ratio: 2.5 (calc) (ref <5.0)
Triglycerides: 39 mg/dL (ref <150)

## 2019-11-07 LAB — HEMOGLOBIN A1C
Hgb A1c MFr Bld: 5.7 % of total Hgb — ABNORMAL HIGH (ref <5.7)
Mean Plasma Glucose: 117 (calc)
eAG (mmol/L): 6.5 (calc)

## 2019-11-07 LAB — TSH: TSH: 1.07 mIU/L (ref 0.40–4.50)

## 2019-11-09 ENCOUNTER — Encounter: Payer: Self-pay | Admitting: Internal Medicine

## 2019-11-09 NOTE — Assessment & Plan Note (Signed)
stable overall by history and exam, recent data reviewed with pt, and pt to continue medical treatment as before,  to f/u any worsening symptoms or concerns  

## 2019-11-09 NOTE — Assessment & Plan Note (Signed)

## 2019-11-09 DEATH — deceased

## 2019-11-17 DIAGNOSIS — G4733 Obstructive sleep apnea (adult) (pediatric): Secondary | ICD-10-CM | POA: Diagnosis not present

## 2019-11-19 DIAGNOSIS — G4731 Primary central sleep apnea: Secondary | ICD-10-CM | POA: Diagnosis not present

## 2019-12-03 ENCOUNTER — Other Ambulatory Visit: Payer: Self-pay | Admitting: Internal Medicine

## 2019-12-17 DIAGNOSIS — Z136 Encounter for screening for cardiovascular disorders: Secondary | ICD-10-CM | POA: Diagnosis not present

## 2019-12-17 DIAGNOSIS — I10 Essential (primary) hypertension: Secondary | ICD-10-CM | POA: Diagnosis not present

## 2019-12-17 DIAGNOSIS — R9431 Abnormal electrocardiogram [ECG] [EKG]: Secondary | ICD-10-CM | POA: Diagnosis not present

## 2019-12-17 DIAGNOSIS — E785 Hyperlipidemia, unspecified: Secondary | ICD-10-CM | POA: Diagnosis not present

## 2019-12-18 DIAGNOSIS — G4733 Obstructive sleep apnea (adult) (pediatric): Secondary | ICD-10-CM | POA: Diagnosis not present

## 2019-12-20 DIAGNOSIS — G4731 Primary central sleep apnea: Secondary | ICD-10-CM | POA: Diagnosis not present

## 2020-01-17 DIAGNOSIS — G4733 Obstructive sleep apnea (adult) (pediatric): Secondary | ICD-10-CM | POA: Diagnosis not present

## 2020-01-19 DIAGNOSIS — G4731 Primary central sleep apnea: Secondary | ICD-10-CM | POA: Diagnosis not present

## 2020-02-11 DIAGNOSIS — D2272 Melanocytic nevi of left lower limb, including hip: Secondary | ICD-10-CM | POA: Diagnosis not present

## 2020-02-11 DIAGNOSIS — D225 Melanocytic nevi of trunk: Secondary | ICD-10-CM | POA: Diagnosis not present

## 2020-02-11 DIAGNOSIS — Z08 Encounter for follow-up examination after completed treatment for malignant neoplasm: Secondary | ICD-10-CM | POA: Diagnosis not present

## 2020-02-11 DIAGNOSIS — Z85828 Personal history of other malignant neoplasm of skin: Secondary | ICD-10-CM | POA: Diagnosis not present

## 2020-02-11 DIAGNOSIS — L814 Other melanin hyperpigmentation: Secondary | ICD-10-CM | POA: Diagnosis not present

## 2020-02-11 DIAGNOSIS — Z1283 Encounter for screening for malignant neoplasm of skin: Secondary | ICD-10-CM | POA: Diagnosis not present

## 2020-02-11 DIAGNOSIS — C44719 Basal cell carcinoma of skin of left lower limb, including hip: Secondary | ICD-10-CM | POA: Diagnosis not present

## 2020-02-17 DIAGNOSIS — G4733 Obstructive sleep apnea (adult) (pediatric): Secondary | ICD-10-CM | POA: Diagnosis not present

## 2020-02-19 DIAGNOSIS — G4731 Primary central sleep apnea: Secondary | ICD-10-CM | POA: Diagnosis not present

## 2020-02-24 DIAGNOSIS — I10 Essential (primary) hypertension: Secondary | ICD-10-CM | POA: Diagnosis not present

## 2020-02-24 DIAGNOSIS — G4700 Insomnia, unspecified: Secondary | ICD-10-CM | POA: Diagnosis not present

## 2020-02-24 DIAGNOSIS — G4731 Primary central sleep apnea: Secondary | ICD-10-CM | POA: Diagnosis not present

## 2020-03-02 DIAGNOSIS — C44719 Basal cell carcinoma of skin of left lower limb, including hip: Secondary | ICD-10-CM | POA: Diagnosis not present

## 2020-03-17 ENCOUNTER — Other Ambulatory Visit: Payer: Self-pay | Admitting: Internal Medicine

## 2020-03-17 NOTE — Telephone Encounter (Signed)
Please refill as per office routine med refill policy (all routine meds refilled for 3 mo or monthly per pt preference up to one year from last visit, then month to month grace period for 3 mo, then further med refills will have to be denied)  

## 2020-03-18 DIAGNOSIS — G4733 Obstructive sleep apnea (adult) (pediatric): Secondary | ICD-10-CM | POA: Diagnosis not present

## 2020-03-20 DIAGNOSIS — G4731 Primary central sleep apnea: Secondary | ICD-10-CM | POA: Diagnosis not present

## 2020-03-22 ENCOUNTER — Other Ambulatory Visit: Payer: Self-pay

## 2020-03-23 ENCOUNTER — Other Ambulatory Visit: Payer: Self-pay | Admitting: Internal Medicine

## 2020-03-23 NOTE — Telephone Encounter (Signed)
Please refill as per office routine med refill policy (all routine meds refilled for 3 mo or monthly per pt preference up to one year from last visit, then month to month grace period for 3 mo, then further med refills will have to be denied)  

## 2020-03-29 MED ORDER — GLUCOSE BLOOD VI STRP: ORAL_STRIP | 12 refills | Status: DC

## 2020-03-29 MED ORDER — TRUE METRIX AIR GLUCOSE METER W/DEVICE KIT: PACK | 0 refills | Status: DC

## 2020-03-29 MED ORDER — LANCETS 33G MISC: 3 refills | Status: DC

## 2020-03-29 MED ORDER — BD SWABS SINGLE USE BUTTERFLY PADS: MEDICATED_PAD | 5 refills | Status: DC

## 2020-03-29 NOTE — Addendum Note (Signed)
Addended by: Marijean Heath R on: 03/29/2020 03:47 PM   Modules accepted: Orders

## 2020-04-13 DIAGNOSIS — R972 Elevated prostate specific antigen [PSA]: Secondary | ICD-10-CM | POA: Diagnosis not present

## 2020-04-14 DIAGNOSIS — H2513 Age-related nuclear cataract, bilateral: Secondary | ICD-10-CM | POA: Diagnosis not present

## 2020-04-14 DIAGNOSIS — H40013 Open angle with borderline findings, low risk, bilateral: Secondary | ICD-10-CM | POA: Diagnosis not present

## 2020-04-19 DIAGNOSIS — R972 Elevated prostate specific antigen [PSA]: Secondary | ICD-10-CM | POA: Diagnosis not present

## 2020-04-19 DIAGNOSIS — N200 Calculus of kidney: Secondary | ICD-10-CM | POA: Diagnosis not present

## 2020-04-19 DIAGNOSIS — R3121 Asymptomatic microscopic hematuria: Secondary | ICD-10-CM | POA: Diagnosis not present

## 2020-04-19 DIAGNOSIS — N400 Enlarged prostate without lower urinary tract symptoms: Secondary | ICD-10-CM | POA: Diagnosis not present

## 2020-04-19 DIAGNOSIS — K8020 Calculus of gallbladder without cholecystitis without obstruction: Secondary | ICD-10-CM | POA: Diagnosis not present

## 2020-04-20 ENCOUNTER — Other Ambulatory Visit: Payer: Self-pay | Admitting: Urology

## 2020-04-20 ENCOUNTER — Other Ambulatory Visit (HOSPITAL_COMMUNITY): Admission: RE | Admit: 2020-04-20 | Payer: Medicare PPO | Source: Ambulatory Visit

## 2020-04-20 DIAGNOSIS — G4731 Primary central sleep apnea: Secondary | ICD-10-CM | POA: Diagnosis not present

## 2020-04-20 DIAGNOSIS — N201 Calculus of ureter: Secondary | ICD-10-CM

## 2020-04-23 ENCOUNTER — Other Ambulatory Visit (HOSPITAL_COMMUNITY): Payer: Medicare PPO

## 2020-04-26 NOTE — Progress Notes (Signed)
Patient to arrive at 0800 on 04/29/20. History and medications reviewed. Pre-procedure instructions given. NPO after MN except for clear liquids until 0600. Blood pressure medications with sip of water morning of procedure. Driver secured.

## 2020-04-27 ENCOUNTER — Encounter (HOSPITAL_BASED_OUTPATIENT_CLINIC_OR_DEPARTMENT_OTHER): Payer: Self-pay | Admitting: Urology

## 2020-04-27 ENCOUNTER — Other Ambulatory Visit (HOSPITAL_COMMUNITY)
Admission: RE | Admit: 2020-04-27 | Discharge: 2020-04-27 | Disposition: A | Discharge status: Home / Self Care | Payer: Medicare PPO | Source: Ambulatory Visit | Attending: Urology | Admitting: Urology

## 2020-04-27 DIAGNOSIS — Z01812 Encounter for preprocedural laboratory examination: Secondary | ICD-10-CM | POA: Diagnosis not present

## 2020-04-27 DIAGNOSIS — Z20822 Contact with and (suspected) exposure to covid-19: Secondary | ICD-10-CM | POA: Insufficient documentation

## 2020-04-28 LAB — SARS CORONAVIRUS 2 (TAT 6-24 HRS): SARS Coronavirus 2: NEGATIVE

## 2020-04-29 ENCOUNTER — Encounter (HOSPITAL_BASED_OUTPATIENT_CLINIC_OR_DEPARTMENT_OTHER): Payer: Self-pay | Admitting: Urology

## 2020-04-29 ENCOUNTER — Other Ambulatory Visit: Payer: Self-pay

## 2020-04-29 ENCOUNTER — Ambulatory Visit (HOSPITAL_BASED_OUTPATIENT_CLINIC_OR_DEPARTMENT_OTHER)
Admission: RE | Admit: 2020-04-29 | Discharge: 2020-04-29 | Disposition: A | Discharge status: Home / Self Care | Payer: Medicare PPO | Attending: Urology | Admitting: Urology

## 2020-04-29 ENCOUNTER — Ambulatory Visit (HOSPITAL_COMMUNITY): Payer: Medicare PPO

## 2020-04-29 ENCOUNTER — Encounter (HOSPITAL_BASED_OUTPATIENT_CLINIC_OR_DEPARTMENT_OTHER)
Admission: RE | Disposition: A | Discharge status: Home / Self Care | Payer: Self-pay | Source: Home / Self Care | Attending: Urology

## 2020-04-29 DIAGNOSIS — I10 Essential (primary) hypertension: Secondary | ICD-10-CM | POA: Diagnosis not present

## 2020-04-29 DIAGNOSIS — N201 Calculus of ureter: Secondary | ICD-10-CM

## 2020-04-29 DIAGNOSIS — N200 Calculus of kidney: Secondary | ICD-10-CM | POA: Diagnosis not present

## 2020-04-29 DIAGNOSIS — Z01818 Encounter for other preprocedural examination: Secondary | ICD-10-CM | POA: Diagnosis not present

## 2020-04-29 DIAGNOSIS — G4730 Sleep apnea, unspecified: Secondary | ICD-10-CM | POA: Diagnosis not present

## 2020-04-29 HISTORY — PX: EXTRACORPOREAL SHOCK WAVE LITHOTRIPSY: SHX1557

## 2020-04-29 HISTORY — DX: Sleep apnea, unspecified: G47.30

## 2020-04-29 SURGERY — LITHOTRIPSY, ESWL
Anesthesia: LOCAL | Laterality: Right

## 2020-04-29 MED ORDER — HYDROCODONE-ACETAMINOPHEN 5-325 MG PO TABS: 1.0000 | ORAL_TABLET | ORAL | 0 refills | Status: DC | PRN

## 2020-04-29 MED ORDER — DIAZEPAM 5 MG PO TABS
10.0000 mg | ORAL_TABLET | ORAL | Status: AC
  Administered 2020-04-29: 10 mg via ORAL

## 2020-04-29 MED ORDER — CIPROFLOXACIN HCL 500 MG PO TABS
ORAL_TABLET | ORAL | Status: AC
  Filled 2020-04-29: qty 1

## 2020-04-29 MED ORDER — SODIUM CHLORIDE 0.9 % IV SOLN: INTRAVENOUS | Status: DC

## 2020-04-29 MED ORDER — DIPHENHYDRAMINE HCL 25 MG PO CAPS
25.0000 mg | ORAL_CAPSULE | ORAL | Status: AC
  Administered 2020-04-29: 25 mg via ORAL

## 2020-04-29 MED ORDER — DIPHENHYDRAMINE HCL 25 MG PO CAPS
ORAL_CAPSULE | ORAL | Status: AC
  Filled 2020-04-29: qty 1

## 2020-04-29 MED ORDER — CIPROFLOXACIN HCL 500 MG PO TABS
500.0000 mg | ORAL_TABLET | ORAL | Status: AC
  Administered 2020-04-29: 500 mg via ORAL

## 2020-04-29 MED ORDER — DIAZEPAM 5 MG PO TABS
ORAL_TABLET | ORAL | Status: AC
  Filled 2020-04-29: qty 2

## 2020-04-29 NOTE — H&P (Signed)
Please see note scanned into Epic from [facility].

## 2020-04-29 NOTE — Discharge Instructions (Signed)

## 2020-04-29 NOTE — Op Note (Signed)
See [facility] OP note scanned into chart. Also because of the size, density, location and other factors that cannot be anticipated I feel this will likely be a staged procedure. This fact supersedes any indication in the scanned [facility] operative note to the contrary.  

## 2020-04-30 ENCOUNTER — Encounter (HOSPITAL_BASED_OUTPATIENT_CLINIC_OR_DEPARTMENT_OTHER): Payer: Self-pay | Admitting: Urology

## 2020-05-07 DIAGNOSIS — Z8616 Personal history of COVID-19: Secondary | ICD-10-CM

## 2020-05-07 HISTORY — DX: Personal history of COVID-19: Z86.16

## 2020-05-21 ENCOUNTER — Other Ambulatory Visit: Payer: Self-pay | Admitting: Internal Medicine

## 2020-05-21 DIAGNOSIS — G4731 Primary central sleep apnea: Secondary | ICD-10-CM | POA: Diagnosis not present

## 2020-05-21 DIAGNOSIS — N201 Calculus of ureter: Secondary | ICD-10-CM | POA: Diagnosis not present

## 2020-05-21 NOTE — Telephone Encounter (Signed)
Please refill as per office routine med refill policy (all routine meds refilled for 3 mo or monthly per pt preference up to one year from last visit, then month to month grace period for 3 mo, then further med refills will have to be denied)  

## 2020-05-31 ENCOUNTER — Other Ambulatory Visit: Payer: Self-pay | Admitting: Internal Medicine

## 2020-05-31 DIAGNOSIS — R739 Hyperglycemia, unspecified: Secondary | ICD-10-CM

## 2020-06-01 ENCOUNTER — Encounter: Payer: Self-pay | Admitting: Internal Medicine

## 2020-06-01 ENCOUNTER — Ambulatory Visit (INDEPENDENT_AMBULATORY_CARE_PROVIDER_SITE_OTHER): Payer: Medicare PPO | Admitting: Internal Medicine

## 2020-06-01 ENCOUNTER — Other Ambulatory Visit: Payer: Self-pay

## 2020-06-01 VITALS — BP 160/78 | HR 69 | Temp 97.8°F | Resp 18 | Ht 72.0 in | Wt 216.2 lb

## 2020-06-01 DIAGNOSIS — E7800 Pure hypercholesterolemia, unspecified: Secondary | ICD-10-CM | POA: Diagnosis not present

## 2020-06-01 DIAGNOSIS — R739 Hyperglycemia, unspecified: Secondary | ICD-10-CM

## 2020-06-01 DIAGNOSIS — Z0001 Encounter for general adult medical examination with abnormal findings: Secondary | ICD-10-CM

## 2020-06-01 DIAGNOSIS — Z0000 Encounter for general adult medical examination without abnormal findings: Secondary | ICD-10-CM | POA: Diagnosis not present

## 2020-06-01 DIAGNOSIS — I10 Essential (primary) hypertension: Secondary | ICD-10-CM

## 2020-06-01 DIAGNOSIS — E538 Deficiency of other specified B group vitamins: Secondary | ICD-10-CM

## 2020-06-01 DIAGNOSIS — E559 Vitamin D deficiency, unspecified: Secondary | ICD-10-CM

## 2020-06-01 LAB — CBC WITH DIFFERENTIAL/PLATELET
Basophils Absolute: 0 10*3/uL (ref 0.0–0.1)
Basophils Relative: 0.6 % (ref 0.0–3.0)
Eosinophils Absolute: 0.3 10*3/uL (ref 0.0–0.7)
Eosinophils Relative: 4.6 % (ref 0.0–5.0)
HCT: 40.6 % (ref 39.0–52.0)
Hemoglobin: 13.9 g/dL (ref 13.0–17.0)
Lymphocytes Relative: 22.6 % (ref 12.0–46.0)
Lymphs Abs: 1.3 10*3/uL (ref 0.7–4.0)
MCHC: 34.2 g/dL (ref 30.0–36.0)
MCV: 85.6 fl (ref 78.0–100.0)
Monocytes Absolute: 0.5 10*3/uL (ref 0.1–1.0)
Monocytes Relative: 9.2 % (ref 3.0–12.0)
Neutro Abs: 3.7 10*3/uL (ref 1.4–7.7)
Neutrophils Relative %: 63 % (ref 43.0–77.0)
Platelets: 182 10*3/uL (ref 150.0–400.0)
RBC: 4.74 Mil/uL (ref 4.22–5.81)
RDW: 14.2 % (ref 11.5–15.5)
WBC: 5.9 10*3/uL (ref 4.0–10.5)

## 2020-06-01 LAB — URINALYSIS, ROUTINE W REFLEX MICROSCOPIC
Bilirubin Urine: NEGATIVE
Hgb urine dipstick: NEGATIVE
Ketones, ur: NEGATIVE
Leukocytes,Ua: NEGATIVE
Nitrite: NEGATIVE
Specific Gravity, Urine: 1.025 (ref 1.000–1.030)
Total Protein, Urine: >=300 — AB
Urine Glucose: NEGATIVE
Urobilinogen, UA: 1 (ref 0.0–1.0)
pH: 7 (ref 5.0–8.0)

## 2020-06-01 LAB — LIPID PANEL
Cholesterol: 128 mg/dL (ref 0–200)
HDL: 44.1 mg/dL (ref >39.00)
LDL Cholesterol: 64 mg/dL (ref 0–99)
NonHDL: 84.35
Total CHOL/HDL Ratio: 3
Triglycerides: 101 mg/dL (ref 0.0–149.0)
VLDL: 20.2 mg/dL (ref 0.0–40.0)

## 2020-06-01 LAB — BASIC METABOLIC PANEL
BUN: 16 mg/dL (ref 6–23)
CO2: 27 mEq/L (ref 19–32)
Calcium: 9.6 mg/dL (ref 8.4–10.5)
Chloride: 105 mEq/L (ref 96–112)
Creatinine, Ser: 1.3 mg/dL (ref 0.40–1.50)
GFR: 53.79 mL/min — ABNORMAL LOW (ref >60.00)
Glucose, Bld: 141 mg/dL — ABNORMAL HIGH (ref 70–99)
Potassium: 4.6 mEq/L (ref 3.5–5.1)
Sodium: 141 mEq/L (ref 135–145)

## 2020-06-01 LAB — TSH: TSH: 2.43 u[IU]/mL (ref 0.35–4.50)

## 2020-06-01 LAB — HEMOGLOBIN A1C: Hgb A1c MFr Bld: 6.5 % (ref 4.6–6.5)

## 2020-06-01 LAB — VITAMIN D 25 HYDROXY (VIT D DEFICIENCY, FRACTURES): VITD: 30.68 ng/mL (ref 30.00–100.00)

## 2020-06-01 LAB — HEPATIC FUNCTION PANEL
ALT: 11 U/L (ref 0–53)
AST: 15 U/L (ref 0–37)
Albumin: 4.3 g/dL (ref 3.5–5.2)
Alkaline Phosphatase: 79 U/L (ref 39–117)
Bilirubin, Direct: 0.2 mg/dL (ref 0.0–0.3)
Total Bilirubin: 0.9 mg/dL (ref 0.2–1.2)
Total Protein: 6.4 g/dL (ref 6.0–8.3)

## 2020-06-01 LAB — VITAMIN B12: Vitamin B-12: 819 pg/mL (ref 211–911)

## 2020-06-01 LAB — PSA: PSA: 4.8 ng/mL — ABNORMAL HIGH (ref 0.10–4.00)

## 2020-06-01 MED ORDER — HYDROCHLOROTHIAZIDE 12.5 MG PO TABS: 12.5000 mg | ORAL_TABLET | Freq: Every day | ORAL | 3 refills | Status: DC

## 2020-06-01 NOTE — Patient Instructions (Addendum)
Please take all new medication as prescribed - the mild fluid pill for blood pressure  Please continue all other medications as before, and refills have been done if requested.  Please have the pharmacy call with any other refills you may need.  Please continue your efforts at being more active, low cholesterol diet, and weight control.  You are otherwise up to date with prevention measures today.  Please keep your appointments with your specialists as you may have planned - including the cardiology in [place] as you mentioned, and urology later this week  Please go to the LAB at the blood drawing area for the tests to be done  You will be contacted by phone if any changes need to be made immediately.  Otherwise, you will receive a letter about your results with an explanation, but please check with MyChart first.  Please remember to sign up for MyChart if you have not done so, as this will be important to you in the future with finding out test results, communicating by private email, and scheduling acute appointments online when needed.  Please make an Appointment to return in 6 months, or sooner if needed

## 2020-06-01 NOTE — Progress Notes (Signed)
Patient ID: Grant Bowers, male   DOB: 1945/08/11, 75 y.o.   MRN: [id number]         Chief Complaint:: wellness exam and Post Covid F/U (Rm 13. Tested positive for covid 05/07/2020. Patient stated that he feels fine at times he has issues with breathing and some dizziness. Since having covid he has noticed that his blood pressure has been elevated. )         HPI:  Grant Bowers is a 75 y.o. male here for wellness exam; declines covid booster for now, o/w up to date with preventive referrals and immunizations.                          Also had covid infection jan 28 and elevated BP/low HR 45 on occasion since then, as well as mild subjective sob and dizziness.  SBP have been often 170-180.  Has plan to f/u with cardiology in [place] soon.  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 No other complaints - Pt denies chest pain, increased sob or doe, wheezing, orthopnea, PND, increased LE swelling, palpitations, dizziness or syncope.   Pt denies polydipsia, polyuria  Denies new focal neuro s/s.   Pt denies fever, wt loss, night sweats, loss of appetite, or other constitutional symptoms  Conts to gain wt with less active with covid.   Wt Readings from Last 3 Encounters:  06/01/20 216 lb 3.2 oz (98.1 kg)  04/29/20 220 lb (99.8 kg)  11/06/19 (!) 205 lb (93 kg)   BP Readings from Last 3 Encounters:  06/01/20 (!) 160/78  04/29/20 (!) 142/61  11/06/19 (!) 140/60   Immunization History  Administered Date(s) Administered  . Influenza Split 03/13/2011  . Influenza Whole 02/11/2008, 01/08/2009, 02/11/2010  . Influenza, High Dose Seasonal PF 01/10/2019  . Influenza-Unspecified 02/01/2018, 03/22/2020  . Moderna Sars-Covid-2 Vaccination 07/10/2019, 08/09/2019  . Pneumococcal Conjugate-13 09/02/2013  . Pneumococcal Polysaccharide-23 09/17/2014  . Td 04/10/2000, 01/09/2007  . Tdap 10/31/2018  . Zoster 04/08/2008   There are no  preventive care reminders to display for this patient.    Past Medical History:  Diagnosis Date  . Allergy   . Carotid artery stenosis 09/23/2015  . GERD 04/08/2008  . Heart murmur    as a child only   . Hyperlipidemia    om meds and borderline numbers with labs   . HYPERTENSION 04/08/2008  . 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 PSA, INCREASED 04/08/2008  . RENAL CALCULUS 02/11/2010  . Sleep apnea    uses CPAP   Past Surgical History:  Procedure Laterality Date  . collasped lung    . COLONOSCOPY  12/07/2006   [facility] in [place]   . EXTRACORPOREAL SHOCK WAVE LITHOTRIPSY Right 04/29/2020   Procedure: EXTRACORPOREAL SHOCK WAVE LITHOTRIPSY (ESWL);  Surgeon: Noel Christmas, MD;  Location: [facility];  Service: Urology;  Laterality: Right;  . KIDNEY STONE SURGERY      reports that he has never smoked. He has never used smokeless tobacco. He reports current alcohol use. He reports that he does not use drugs. family history includes Colon polyps in his brother; Pancreatic cancer in his mother. Allergies  Allergen Reactions  . Atorvastatin Other (See Comments)    Pt denies this allergy    Current Outpatient Medications on File Prior to Visit  Medication Sig Dispense Refill  . Alcohol Swabs (B-D SINGLE USE SWABS  BUTTERFLY) PADS USE 2X A DAY TO CHECK BLOOD SUGARS. DX:R73.9 100 each 5  . amLODipine (NORVASC) 10 MG tablet TAKE 1 TABLET EVERY DAY 90 tablet 1  . aspirin 81 MG tablet Take 81 mg by mouth daily.    . Blood Glucose Monitoring Suppl (TRUE METRIX METER) w/Device KIT USE AS DIRECTED 1 kit 0  . cetirizine (ZYRTEC) 10 MG tablet TAKE 1 TABLET EVERY DAY 90 tablet 3  . fluticasone (FLONASE) 50 MCG/ACT nasal spray USE 2 SPRAYS IN EACH NOSTRIL EVERY DAY 48 g 3  . glucose blood test strip Use as instructed 2X A DAY TO TEST BLOOD SUGARS. DX: R73.9 100 each 12  . HYDROcodone-acetaminophen (NORCO/VICODIN) 5-325 MG tablet Take 1 tablet by mouth every 4 (four) hours as needed  for moderate pain. 20 tablet 0  . irbesartan (AVAPRO) 300 MG tablet TAKE 1 TABLET AT BEDTIME 90 tablet 1  . labetalol (NORMODYNE) 300 MG tablet TAKE 1 TABLET TWICE DAILY 180 tablet 1  . Lancets 33G MISC USE 2X A DAY TO CHECK BLOOD SUGARS. DX:R73.9 100 each 3  . omeprazole (PRILOSEC) 10 MG capsule TAKE 1 CAPSULE EVERY DAY 90 capsule 1  . rosuvastatin (CRESTOR) 20 MG tablet TAKE 1 TABLET EVERY DAY ( ANNUAL APPOINTMENT DUE IN JULY MUST SEE PROVIDER FOR FURTHER REFILLS ) 90 tablet 0  . sildenafil (REVATIO) 20 MG tablet 3-5 tabs by mouth every other day as needed 60 tablet 2  . tadalafil (CIALIS) 20 MG tablet TAKE 1 TABLET BY MOUTH ONCE DAILY AS NEEDED FOR  ERECTILE  DYSFUNCTION 30 tablet 0  . tamsulosin (FLOMAX) 0.4 MG CAPS capsule TAKE 1 CAPSULE EVERY DAY 90 capsule 3  . testosterone cypionate (DEPOTESTOSTERONE CYPIONATE) 200 MG/ML injection INJECT 0.5 ML (100 MG TOTAL) INTO THE MUSCLE EVERY 14 (FOURTEEN) DAYS. (SINGLE USE VIAL ONLY) 6 mL 0   No current facility-administered medications on file prior to visit.        ROS:  All others reviewed and negative.  Objective        PE:  BP (!) 160/78   Pulse 69   Temp 97.8 F (36.6 C) (Oral)   Resp 18   Ht 6' (1.829 m)   Wt 216 lb 3.2 oz (98.1 kg)   SpO2 95%   BMI 29.32 kg/m                 Constitutional: Pt appears in NAD               HENT: Head: NCAT.                Right Ear: External ear normal.                 Left Ear: External ear normal.                Eyes: . Pupils are equal, round, and reactive to light. Conjunctivae and EOM are normal               Nose: without d/c or deformity               Neck: Neck supple. Gross normal ROM               Cardiovascular: Normal rate and regular rhythm.                 Pulmonary/Chest: Effort normal and breath sounds without rales or wheezing.  Abd:  Soft, NT, ND, + BS, no organomegaly               Neurological: Pt is alert. At baseline orientation, motor grossly intact                Skin: Skin is warm. No rashes, no other new lesions, LE edema - none               Psychiatric: Pt behavior is normal without agitation   Micro: none  Cardiac tracings I have personally interpreted today:  none  Pertinent Radiological findings (summarize): none   Lab Results  Component Value Date   WBC 5.9 06/01/2020   HGB 13.9 06/01/2020   HCT 40.6 06/01/2020   PLT 182.0 06/01/2020   GLUCOSE 141 (H) 06/01/2020   CHOL 128 06/01/2020   TRIG 101.0 06/01/2020   HDL 44.10 06/01/2020   LDLDIRECT 150.3 02/11/2010   LDLCALC 64 06/01/2020   ALT 11 06/01/2020   AST 15 06/01/2020   NA 141 06/01/2020   K 4.6 06/01/2020   CL 105 06/01/2020   CREATININE 1.30 06/01/2020   BUN 16 06/01/2020   CO2 27 06/01/2020   TSH 2.43 06/01/2020   PSA 4.80 (H) 06/01/2020   INR 1.2 ratio (H) 02/15/2010   HGBA1C 6.5 06/01/2020   Assessment/Plan:  Grant Bowers is a 75 y.o. White or Caucasian [1] male with  has a past medical history of Allergy, Carotid artery stenosis (09/23/2015), GERD (04/08/2008), Heart murmur, Hyperlipidemia, HYPERTENSION (04/08/2008), HYPOGONADISM, MALE (04/08/2008), PSA, INCREASED (04/08/2008), RENAL CALCULUS (02/11/2010), and Sleep apnea.  Encounter for well adult exam with abnormal findings Age and sex appropriate education and counseling updated with regular exercise and diet Referrals for preventative services - none needed Immunizations addressed - will need covid booster at 90 days Smoking counseling  - none needed Evidence for depression or other mood disorder - none significant Most recent labs reviewed. I have personally reviewed and have noted: 1) the patient's medical and social history 2) The patient's current medications and supplements 3) The patient's height, weight, and BMI have been recorded in the chart   Hyperglycemia Lab Results  Component Value Date   HGBA1C 6.5 06/01/2020   Stable, pt to continue current medical treatment  -  diet   Hypertension, uncontrolled BP Readings from Last 3 Encounters:  06/01/20 (!) 160/78  04/29/20 (!) 142/61  11/06/19 (!) 140/60   uncontrolled, pt to continue medical treatment amlodipine, avapro, labetolol, but also add hct 12.5 mg qd  Current Outpatient Medications (Endocrine & Metabolic):  .  testosterone cypionate (DEPOTESTOSTERONE CYPIONATE) 200 MG/ML injection, INJECT 0.5 ML (100 MG TOTAL) INTO THE MUSCLE EVERY 14 (FOURTEEN) DAYS. (SINGLE USE VIAL ONLY)  Current Outpatient Medications (Cardiovascular):  .  amLODipine (NORVASC) 10 MG tablet, TAKE 1 TABLET EVERY DAY .  hydrochlorothiazide (HYDRODIURIL) 12.5 MG tablet, Take 1 tablet (12.5 mg total) by mouth daily. .  irbesartan (AVAPRO) 300 MG tablet, TAKE 1 TABLET AT BEDTIME .  labetalol (NORMODYNE) 300 MG tablet, TAKE 1 TABLET TWICE DAILY .  rosuvastatin (CRESTOR) 20 MG tablet, TAKE 1 TABLET EVERY DAY ( ANNUAL APPOINTMENT DUE IN JULY MUST SEE PROVIDER FOR FURTHER REFILLS ) .  sildenafil (REVATIO) 20 MG tablet, 3-5 tabs by mouth every other day as needed .  tadalafil (CIALIS) 20 MG tablet, TAKE 1 TABLET BY MOUTH ONCE DAILY AS NEEDED FOR  ERECTILE  DYSFUNCTION  Current Outpatient Medications (Respiratory):  .  cetirizine (ZYRTEC) 10 MG tablet, TAKE  1 TABLET EVERY DAY .  fluticasone (FLONASE) 50 MCG/ACT nasal spray, USE 2 SPRAYS IN EACH NOSTRIL EVERY DAY  Current Outpatient Medications (Analgesics):  .  aspirin 81 MG tablet, Take 81 mg by mouth daily. Marland Kitchen  HYDROcodone-acetaminophen (NORCO/VICODIN) 5-325 MG tablet, Take 1 tablet by mouth every 4 (four) hours as needed for moderate pain.   Current Outpatient Medications (Other):  Marland Kitchen  Alcohol Swabs (B-D SINGLE USE SWABS BUTTERFLY) PADS, USE 2X A DAY TO CHECK BLOOD SUGARS. DX:R73.9 .  Blood Glucose Monitoring Suppl (TRUE METRIX METER) w/Device KIT, USE AS DIRECTED .  glucose blood test strip, Use as instructed 2X A DAY TO TEST BLOOD SUGARS. DX: R73.9 .  Lancets 33G MISC, USE 2X A  DAY TO CHECK BLOOD SUGARS. DX:R73.9 .  omeprazole (PRILOSEC) 10 MG capsule, TAKE 1 CAPSULE EVERY DAY .  tamsulosin (FLOMAX) 0.4 MG CAPS capsule, TAKE 1 CAPSULE EVERY DAY   Hyperlipidemia Lab Results  Component Value Date   LDLCALC 64 06/01/2020   Stable, pt to continue current statin crestor 20   Followup: Return in about 6 months (around 11/29/2020).  Grant Barre, MD 06/10/2020 10:51 PM [facility]

## 2020-06-04 DIAGNOSIS — N202 Calculus of kidney with calculus of ureter: Secondary | ICD-10-CM | POA: Diagnosis not present

## 2020-06-04 DIAGNOSIS — N201 Calculus of ureter: Secondary | ICD-10-CM | POA: Diagnosis not present

## 2020-06-10 ENCOUNTER — Encounter: Payer: Self-pay | Admitting: Internal Medicine

## 2020-06-10 NOTE — Assessment & Plan Note (Signed)
BP Readings from Last 3 Encounters:  06/01/20 (!) 160/78  04/29/20 (!) 142/61  11/06/19 (!) 140/60   uncontrolled, pt to continue medical treatment amlodipine, avapro, labetolol, but also add hct 12.5 mg qd  Current Outpatient Medications (Endocrine & Metabolic):  .  testosterone cypionate (DEPOTESTOSTERONE CYPIONATE) 200 MG/ML injection, INJECT 0.5 ML (100 MG TOTAL) INTO THE MUSCLE EVERY 14 (FOURTEEN) DAYS. (SINGLE USE VIAL ONLY)  Current Outpatient Medications (Cardiovascular):  .  amLODipine (NORVASC) 10 MG tablet, TAKE 1 TABLET EVERY DAY .  hydrochlorothiazide (HYDRODIURIL) 12.5 MG tablet, Take 1 tablet (12.5 mg total) by mouth daily. .  irbesartan (AVAPRO) 300 MG tablet, TAKE 1 TABLET AT BEDTIME .  labetalol (NORMODYNE) 300 MG tablet, TAKE 1 TABLET TWICE DAILY .  rosuvastatin (CRESTOR) 20 MG tablet, TAKE 1 TABLET EVERY DAY ( ANNUAL APPOINTMENT DUE IN JULY MUST SEE PROVIDER FOR FURTHER REFILLS ) .  sildenafil (REVATIO) 20 MG tablet, 3-5 tabs by mouth every other day as needed .  tadalafil (CIALIS) 20 MG tablet, TAKE 1 TABLET BY MOUTH ONCE DAILY AS NEEDED FOR  ERECTILE  DYSFUNCTION  Current Outpatient Medications (Respiratory):  .  cetirizine (ZYRTEC) 10 MG tablet, TAKE 1 TABLET EVERY DAY .  fluticasone (FLONASE) 50 MCG/ACT nasal spray, USE 2 SPRAYS IN EACH NOSTRIL EVERY DAY  Current Outpatient Medications (Analgesics):  .  aspirin 81 MG tablet, Take 81 mg by mouth daily. Marland Kitchen  HYDROcodone-acetaminophen (NORCO/VICODIN) 5-325 MG tablet, Take 1 tablet by mouth every 4 (four) hours as needed for moderate pain.   Current Outpatient Medications (Other):  Marland Kitchen  Alcohol Swabs (B-D SINGLE USE SWABS BUTTERFLY) PADS, USE 2X A DAY TO CHECK BLOOD SUGARS. DX:R73.9 .  Blood Glucose Monitoring Suppl (TRUE METRIX METER) w/Device KIT, USE AS DIRECTED .  glucose blood test strip, Use as instructed 2X A DAY TO TEST BLOOD SUGARS. DX: R73.9 .  Lancets 33G MISC, USE 2X A DAY TO CHECK BLOOD SUGARS. DX:R73.9 .   omeprazole (PRILOSEC) 10 MG capsule, TAKE 1 CAPSULE EVERY DAY .  tamsulosin (FLOMAX) 0.4 MG CAPS capsule, TAKE 1 CAPSULE EVERY DAY

## 2020-06-10 NOTE — Assessment & Plan Note (Signed)
Lab Results  Component Value Date   LDLCALC 64 06/01/2020   Stable, pt to continue current statin crestor 20

## 2020-06-10 NOTE — Assessment & Plan Note (Signed)
Age and sex appropriate education and counseling updated with regular exercise and diet Referrals for preventative services - none needed Immunizations addressed - will need covid booster at 90 days Smoking counseling  - none needed Evidence for depression or other mood disorder - none significant Most recent labs reviewed. I have personally reviewed and have noted: 1) the patient's medical and social history 2) The patient's current medications and supplements 3) The patient's height, weight, and BMI have been recorded in the chart

## 2020-06-10 NOTE — Assessment & Plan Note (Signed)
Lab Results  Component Value Date   HGBA1C 6.5 06/01/2020   Stable, pt to continue current medical treatment  - diet

## 2020-06-11 ENCOUNTER — Other Ambulatory Visit: Payer: Self-pay | Admitting: Urology

## 2020-06-11 DIAGNOSIS — N201 Calculus of ureter: Secondary | ICD-10-CM

## 2020-06-11 NOTE — Progress Notes (Signed)
Pre procedure call completed. Pt. Updated on date and time of arrival. Times for NPO and clear liquid consumption reviewed. Pt. Allergies, medical hx and medication list reviewed. Medications ok to take day of surgery and those to hold prior to surgery reviewed. Ride secured for day of surgery. Questions and concerns addressed by patient. Pt. Verbalized understanding of all instructions.

## 2020-06-14 NOTE — H&P (Signed)
75 year-old male established patient who is here for follow up for further evaluation of his elevated PSA.   His last PSA was performed 09/12/2018. The last PSA value was 5.42.   He has had a prostate biopsy. Patient does not have a family history of prostate cancer. The patient states he does not take 5 alpha reductase inhibitor medication.   He has not recently had unwanted weight loss. He is not having new bone pain. This condition would be considered of mild to moderate severity with no modifying factors or associated signs or symptoms other than as noted above.   08/22/16: He was found to have an elevated PSA of 5.41 on 08/09/16. He has been on testosterone injections monthly for hypogonadism. He was recently found to have a low serum testosterone and was told to increase his testosterone injections to every 2 weeks.   03/06/17: He reports that he has not had any change in his voiding pattern. He does have erectile dysfunction and is currently using Cialis that he gets from [place].   03/14/18: He continues to do well with no new urologic complaints. Specifically no voiding difficulties. He uses Cialis in this remains effective for his erectile dysfunction.   10/03/18: He returns today for follow-up of an elevated PSA and has been doing well from urologic standpoint. He continues to use Cialis at a 20 mg dose. No new voiding complaints are noted. No bone pain or unexplained weight loss.  -10/30/19-Patient history of BPH and marginal PSA elevation as above. Been doing well. Remains on tamsulosin 0.4 mg daily and voiding well. Most recent PSA now down to 4.27 on 10/06/2019. This is down from 4.49 in December of 2020. Patient has had negative prostate biopsy on 11/16/2014. Patient continues to take Cialis as needed for erectile dysfunction.  -04/19/20-patient with history of elevated PSA, BPH and nephrolithiasis and erectile dysfunction. Patient had recent PSA 4.27 on 10/06/2019. Repeat PSA on 04/17/2020 is  now 3.97. Baseline values run between 4 and 6.   Second issue is that of erectile dysfunction. Patient has used sildenafil with some limited success in the past. He was interested in looking at information regarding vacuum erection device that he had researched online. I gave him web site for [employer] [facility] to look at the devices.   Patient has had history of nephrolithiasis and had recent episode of back pain with associated hematuria after playing off last week. Currently minimally symptomatic.  KUB is reviewed today and shows several calcifications ranging in the 4-5 mm range overlying the lower caliceal area of the right kidney. There is also a probable 7 mm calcification in the region of the right upper ureter/UPJ area. No obvious distal stone seen on the right and no obvious stones on the left. No bony abnormalities noted.  Micro urinalysis today shows 3-10 RBCs.  CT urogram was obtained and by initial review shows: What appears to be probable parapelvic cyst region the right kidney with 7-8 mm calcification at the upper ureter/UPJ area with perhaps mild hydro noted. There is also a cluster of small stones in the right lower caliceal area. Over read is pending  -05/21/20-patient with history of 7 mm right upper ureteral calculus. Underwent ESL on 05/01/2020 by Dr. pace. Passed some blood but no definitive stone fragments. He is currently pain-free. Here for evaluation and management post lithotripsy.  KUB is reviewed today shows persistent calcification in the previously treated area but the stone looks significantly fragmented and splayed consistent with  good fragmentation.   06/04/2020: Returns today for follow-up exam. He denies interval stone material passage. He has had no recurrence of pain/discomfort. Voiding at his baseline with stable, grossly non bothersome symptomatology. KUB today demonstrate the continued presence of a fragmented right ureteral calculi.     ALLERGIES: Atorvastatin  Calcium TABS    MEDICATIONS: Cialis 20 mg tablet 1 tablet PO Every Other Day PRN  Amlodipine Besylate 10 mg tablet Oral  Aspirin 81 MG TABS Oral  Avapro 300 MG Oral Tablet Oral  Cialis 5 mg tablet 1 Oral  Fluticasone Propionate 50 mcg/actuation spray, suspension Nasal  Labetalol HCl - 300 MG Oral Tablet Oral  Omeprazole 40 MG Oral Capsule Delayed Release Oral  Simvastatin 20 MG Oral Tablet Oral  Testosterone Cypionate 200 mg/ml vial Intramuscular     GU PSH: Cystoscopy - 2018 ESWL, Right - 04/29/2020 Locm 300-399Mg /Ml Iodine,1Ml - 2018 Prostate Needle Biopsy - 2016 Ureteroscopic stone removal - 2016       PSH Notes: Biopsy Of The Prostate Needle, Cystoscopy With Ureteroscopy With Removal Of Calculus   NON-GU PSH: None         GU PMH: Ureteral calculus - 05/21/2020 BPH w/o LUTS (Stable) - 04/19/2020, (Stable), He has a markedly enlarged prostate by exam. He does not have any significant voiding symptoms, - 2018, He does have BPH by exam. He does not have any significant obstructive voiding symptoms., - 2018 Elevated PSA - 04/19/2020, (Stable), He was again noted to have an enlarged prostate but has no nodularity or worrisome induration. In addition his PSA, although elevated at 5.42, maintains a good free to total ratio and actually has been quite stable over time., - 10/03/2018 (Improving), Her his prostate remains benign to exam and his most recent PSA was 4.42. I will check his PSA today and plan to see him back in 6 months., - 03/14/2018 (Stable), His PSA remains elevated but he has an excellent free to total ratio and it is currently lower that it is been. His prostate is enlarged and most likely accounts for most of his slight PSA elevation. I have recommended continued monitoring of his PSA every 6 months., - 2018 (Stable), We discussed the fact that he does have an elevated PSA and it has increased slightly. While this very likely could be due to the effects of his testosterone  replacement I have recommended we monitor this a little bit more closely with a repeat PSA again in 3 months and then again in 6 months., - 2018 (Worsening), I have discussed with the patient the possible need for further evaluation of his elevated PSA. We have discussed the options which would be continued observation with serial DRE and PSAs versus proceeding with further evaluation at this time with TRUS/Bx. We have discussed the possible risk of progression and spread of prostate cancer if present currently as well as the fact that typically prostate cancer tends to be a relatively slow-growing form of cancer that typically would not progress significantly over the relative short period of time between serial examinations. We also have discussed proceeding at this time with a prostate biopsy and I therefore have gone over the procedure with him in detail. He is familiar with a prostate biopsy having undergone one in the past. We have elected to proceed with close monitoring of his PSA since he had no worrisome findings on DRE and his PSA is currently just slightly above where it was in 6/16 when it was 5.1 with a  previous negative prostate biopsy., - 2018, Elevated PSA, - 2016 Microscopic hematuria (Stable) - 04/19/2020, (Stable), I have discussed with the patient the fact that the CT scan has revealed no abnormality of the upper tract that could contribute to the presence of microscopic hematuria and cystoscopically no abnormality of the lower tract was identified. In this case current recommendations are that the patient be followed for microscopic hematuria for 3 - 5 years with urinalysis and serum creatinine on a yearly basis. If the urine remains positive for microscopic hematuria and the patient develops new hypertension, proteinuria or blood cell casts on urinalysis evaluation for primary renal disease should be undertaken. If, on the other hand, microscopic hematuria persists in the absence of any  findings to suggest primary renal disease, then repeat urologic evaluation should be considered. In addition if the patient has 2 consecutive negative urinalyses yearly over a two-year period then no further urinalyses for the purpose of evaluation of asymptomatic microscopic hematuria are necessary., - 2018, I have discussed with the patient that the evaluation of asymptomatic microscopic hematuria consists of excluding benign causes including menstruation in women as well as, in both sexes, vigorous exercise, sexual activity, viral illnesses, trauma and infection. If these factors are negative and the urinalysis reveals significant proteinuria, dysmorphic red blood cells or red cell casts and/or an elevated serum creatinine then evaluation by a nephrologist is indicated. If conditions suggestive of primary renal disease are not present and the patient has a low risk of urothelial malignancy consisting of age less than 40 years, no smoking history, no history of chemical exposure, no irritative voiding symptoms, no history of gross hematuria and no history of urologic disorder or disease then upper tract imaging should be undertaken with a CT scan and consideration of cystoscopy versus urine cytology as well. If, on the other hand the patient is at high risk then a complete evaluation should be undertaken including upper tract imaging with CT scan and cystoscopy and consideration of urine cytology as well., - 2018 Renal and ureteral calculus - 04/19/2020 Renal calculus, Right - 04/19/2020, Right, He was noted to have 3 right renal calculi in the lower pole causing no obstruction., - 2018, Kidney stone on right side, - 2014 BPH w/LUTS (Stable), He has known BPH. He has been taking tamsulosin 0.4 mg. - 2020 Hematospermia, I have reassured him of the fact that his hematospermia is almost certainly not due to prostate cancer or other significant pathology especially with clear urine and no pain. - 2020 Peyronies  Disease, He has very mild Peyronie's disease. It is not impacting his ability to engage in intercourse. We have elected to continue to monitor this. - 2020 Urinary Urgency, He has developed some urgency and so I have suggested he try increasing his dosage of tamsulosin to 0.8 mg and if this is helpful he is going to contact me for a new prescription. - 2020 Epididymitis - 2019 Orchitis - 2019 Renal cyst, Bilateral, He was found to have a 7 cm lobulated simple cyst in the right kidney with smaller bilateral cysts noted as well. These are of no clinical significance. - 2018, Renal cyst, acquired, right, - 2014 ED due to arterial insufficiency, Erectile dysfunction due to arterial insufficiency - 2016 Primary hypogonadism, Hypogonadism - 2014, Hypogonadism, testicular, - 2014       PMH Notes: Hypogonadism:  Testosterone level (T/F):  12/12 - 561/131  12/13 - 168/34   Nephrolithiasis: He reports having passed multiple stones over the years.  He required ureteroscopic extraction of a stone in 2012. CT scan in 4/14 revealed a calcification in the upper pole of the right kidney that was felt to either represent a small, nonobstructing renal calculus versus a vascular calcification.  He did undergo ureteroscopy in [place] in the past.  CT scan 5/18 - single 3 mm stone in the lower pole of the right kidney.   Renal cysts: CT scan without contrast on 08/07/12 - lesions within the right kidney. CT scan with contrast that same day which revealed the lesions within the right kidney to be simple cysts. The study also indicated there was a calcification in the upper pole of the right kidney that was felt to represent either a small, nonobstructing renal calculus versus a vascular calcification.  CT scan 5/18 - bilateral renal cysts   Elevated PSA: He was found to have a PSA of 5.15 in 6/16 which was up from 4.41 the previous year. No family history of prostate cancer. No abnormality on DRE.  TRUS/BX 11/16/14:  BPH only  PSAs:  12/12 - 3.04  12/13 - 3.44  5/15 - 4.41  6/16 - 5.15   Erectile dysfunction: In 8/16 he was started on Cialis 5 mg for erectile dysfunction.   Microscopic hematuria: He was found to have microscopic hematuria and 5/18. A CT scan revealed a single right renal calculus but no other abnormality and cystoscopy was negative.    NON-GU PMH: Encounter for general adult medical examination without abnormal findings, Encounter for preventive health examination - 2016 Personal history of other diseases of the circulatory system, History of hypertension - 2014 Personal history of other diseases of the digestive system, History of esophageal reflux - 2014     FAMILY HISTORY: Cancer - Runs In Family Carcinoma Of The Pancreas - Runs In Family Death In The Family Father - Runs In Family Death In The Family Mother - Runs In Family No pertinent family history - Other    SOCIAL HISTORY: Marital Status: Married Preferred Language: English; Ethnicity: Not Hispanic Or Latino; Race: White Current Smoking Status: Patient has never smoked.  <DIV'  Tobacco Use Assessment Completed:  Used Tobacco in last 30 days?        Notes: Alcohol Use, Marital History - Currently Married, Caffeine Use, Never A Smoker    REVIEW OF SYSTEMS:     GU Review Male:  Patient denies frequent urination, hard to postpone urination, burning/ pain with urination, get up at night to urinate, leakage of urine, stream starts and stops, trouble starting your stream, have to strain to urinate , erection problems, and penile pain.    Gastrointestinal (Upper):  Patient denies nausea, vomiting, and indigestion/ heartburn.    Gastrointestinal (Lower):  Patient denies diarrhea and constipation.    Constitutional:  Patient denies fever, night sweats, weight loss, and fatigue.    Skin:  Patient denies skin rash/ lesion and itching.    Eyes:  Patient denies blurred vision and double vision.    Ears/ Nose/ Throat:  Patient denies  sore throat and sinus problems.    Hematologic/Lymphatic:  Patient denies swollen glands and easy bruising.    Cardiovascular:  Patient denies leg swelling and chest pains.    Respiratory:  Patient denies shortness of breath and cough.    Endocrine:  Patient denies excessive thirst.    Musculoskeletal:  Patient denies back pain and joint pain.    Neurological:  Patient denies headaches and dizziness.    Psychologic:  Patient denies  depression and anxiety.    Notes: follow up     VITAL SIGNS:       06/04/2020 04:03 PM     Weight 213.6 lb / 96.89 kg     Height 72 in / 182.88 cm     BP 137/55 mmHg     Pulse 75 /min     Temperature 98.0 F / 36.6 C     BMI 29.0 kg/m     MULTI-SYSTEM PHYSICAL EXAMINATION:      Constitutional: Well-nourished. No physical deformities. Normally developed. Good grooming.     Neck: Neck symmetrical, not swollen. Normal tracheal position.     Respiratory: No labored breathing, no use of accessory muscles.      Cardiovascular: Normal temperature, normal extremity pulses, no swelling, no varicosities.     Skin: No paleness, no jaundice, no cyanosis. No lesion, no ulcer, no rash.     Neurologic / Psychiatric: Oriented to time, oriented to place, oriented to person. No depression, no anxiety, no agitation.     Musculoskeletal: Normal gait and station of head and neck.            Complexity of Data:   Source Of History:  Patient, Medical Record Summary  Records Review:  Previous Doctor Records, Previous Hospital Records, Previous Patient Records  Urine Test Review:  Urinalysis  X-Ray Review: KUB: Reviewed Films. Discussed With Patient.  C.T. Abdomen/Pelvis: Reviewed Films. Reviewed Report.      04/13/20 10/06/19 03/28/19 09/12/18 03/14/18 01/07/18 09/06/17 02/27/17  PSA  Total PSA 3.97 ng/mL 4.27 ng/mL 4.49 ng/mL 5.42 ng/mL 5.43 ng/mL 4.42 ng/mL 5.64 ng/mL 4.80 ng/mL  Free PSA  1.17 ng/mL 1.04 ng/mL 1.89 ng/mL  1.35 ng/mL 1.52 ng/mL 1.39 ng/mL  % Free PSA   27 % PSA 23 % PSA 35 % PSA  31 % PSA 27 % PSA 29 % PSA    06/04/20 06/04/20  Urinalysis  Urine Appearance Clear  Clear   Urine Color Yellow  Yellow   Urine Glucose Neg  Neg mg/dL  Urine Bilirubin Neg  Neg mg/dL  Urine Ketones Neg  Neg mg/dL  Urine Specific Gravity 1.025  1.025   Urine Blood Neg  Neg ery/uL  Urine pH 6.5  6.5   Urine Protein 2+  2+ mg/dL  Urine Urobilinogen 0.2  0.2 mg/dL  Urine Nitrites Neg  Neg   Urine Leukocyte Esterase Neg  Neg leu/uL  Urine WBC/hpf 0 - 5/hpf  0 - 5/hpf   Urine RBC/hpf 0 - 2/hpf  0 - 2/hpf   Urine Epithelial Cells NS (Not Seen)  NS (Not Seen)   Urine Bacteria NS (Not Seen)  NS (Not Seen)   Urine Mucous Not Present  Not Present   Urine Yeast NS (Not Seen)  NS (Not Seen)   Urine Trichomonas Not Present  Not Present   Urine Cystals NS (Not Seen)  NS (Not Seen)   Urine Casts Hyaline  Hyaline   Urine Sperm Not Present  Not Present    PROCEDURES:    KUB - 95638  A single view of the abdomen is obtained. Numerous opacities consistent with right lower pole renal calculi remain grossly stable on today's study. One of the calculi does appear to have migrated more proximally compared to last KUB study but continues to demonstrate a nonobstructing pattern. Previously identified right proximal ureteral calculi continues to to take on a fragmented shape but grossly remains in same anatomical location as previously identified. No additional ureteral  calculi are noted on today's exam.      Patient confirmed No Neulasta OnPro Device.       Urinalysis w/Scope - 81001  Dipstick Dipstick Cont'd Micro  Color: Yellow Bilirubin: Neg WBC/hpf: 0 - 5/hpf  Appearance: Clear Ketones: Neg RBC/hpf: 0 - 2/hpf  Specific Gravity: 1.025 Blood: Neg Bacteria: NS (Not Seen)  pH: 6.5 Protein: 2+ Cystals: NS (Not Seen)  Glucose: Neg Urobilinogen: 0.2 Casts: Hyaline   Nitrites: Neg Trichomonas: Not Present   Leukocyte Esterase: Neg Mucous: Not Present    Epithelial Cells:  NS (Not Seen)    Yeast: NS (Not Seen)    Sperm: Not Present   Notes:       ASSESSMENT:     ICD-10 Details  1 GU:  Renal calculus - N20.0 Right  2  Ureteral calculus - N20.1    PLAN:   Orders  Labs Urine Culture  Schedule  Return Visit/Planned Activity: Other See Visit Notes - Follow up MD, Schedule Surgery   After a thorough review of the management options for the patient's condition the patient  elected to proceed with surgical therapy as noted above. We have discussed the potential benefits and risks of the procedure, side effects of the proposed treatment, the likelihood of the patient achieving the goals of the procedure, and any potential problems that might occur during the procedure or recuperation. Informed consent has been obtained.

## 2020-06-15 ENCOUNTER — Other Ambulatory Visit (HOSPITAL_COMMUNITY)
Admission: RE | Admit: 2020-06-15 | Discharge: 2020-06-15 | Disposition: A | Discharge status: Home / Self Care | Payer: Medicare PPO | Source: Ambulatory Visit | Attending: Urology | Admitting: Urology

## 2020-06-15 DIAGNOSIS — Z20822 Contact with and (suspected) exposure to covid-19: Secondary | ICD-10-CM | POA: Diagnosis not present

## 2020-06-15 DIAGNOSIS — Z01812 Encounter for preprocedural laboratory examination: Secondary | ICD-10-CM | POA: Diagnosis not present

## 2020-06-16 LAB — SARS CORONAVIRUS 2 (TAT 6-24 HRS): SARS Coronavirus 2: NEGATIVE

## 2020-06-17 ENCOUNTER — Emergency Department (HOSPITAL_COMMUNITY): Payer: Medicare PPO

## 2020-06-17 ENCOUNTER — Emergency Department (HOSPITAL_BASED_OUTPATIENT_CLINIC_OR_DEPARTMENT_OTHER)
Admission: RE | Admit: 2020-06-17 | Discharge: 2020-06-17 | Disposition: A | Discharge status: Still In Hospital | Payer: Medicare PPO | Attending: Urology | Admitting: Urology

## 2020-06-17 ENCOUNTER — Encounter (HOSPITAL_COMMUNITY): Payer: Self-pay | Admitting: Emergency Medicine

## 2020-06-17 ENCOUNTER — Emergency Department (HOSPITAL_COMMUNITY)
Admission: EM | Admit: 2020-06-17 | Discharge: 2020-06-17 | Disposition: A | Discharge status: Home / Self Care | Payer: Medicare PPO | Attending: Emergency Medicine | Admitting: Emergency Medicine

## 2020-06-17 ENCOUNTER — Encounter (HOSPITAL_BASED_OUTPATIENT_CLINIC_OR_DEPARTMENT_OTHER): Payer: Self-pay | Admitting: Urology

## 2020-06-17 ENCOUNTER — Encounter (HOSPITAL_COMMUNITY): Admission: RE | Disposition: A | Discharge status: Still In Hospital | Payer: Self-pay | Source: Home / Self Care

## 2020-06-17 ENCOUNTER — Other Ambulatory Visit: Payer: Self-pay

## 2020-06-17 DIAGNOSIS — Z20822 Contact with and (suspected) exposure to covid-19: Secondary | ICD-10-CM | POA: Diagnosis not present

## 2020-06-17 DIAGNOSIS — N200 Calculus of kidney: Secondary | ICD-10-CM | POA: Diagnosis not present

## 2020-06-17 DIAGNOSIS — I10 Essential (primary) hypertension: Secondary | ICD-10-CM | POA: Insufficient documentation

## 2020-06-17 DIAGNOSIS — Z01818 Encounter for other preprocedural examination: Secondary | ICD-10-CM | POA: Diagnosis not present

## 2020-06-17 DIAGNOSIS — R001 Bradycardia, unspecified: Secondary | ICD-10-CM | POA: Insufficient documentation

## 2020-06-17 DIAGNOSIS — Z79899 Other long term (current) drug therapy: Secondary | ICD-10-CM | POA: Insufficient documentation

## 2020-06-17 DIAGNOSIS — Z7982 Long term (current) use of aspirin: Secondary | ICD-10-CM | POA: Insufficient documentation

## 2020-06-17 DIAGNOSIS — N201 Calculus of ureter: Secondary | ICD-10-CM

## 2020-06-17 LAB — CBC WITH DIFFERENTIAL/PLATELET
Abs Immature Granulocytes: 0.02 10*3/uL (ref 0.00–0.07)
Basophils Absolute: 0 10*3/uL (ref 0.0–0.1)
Basophils Relative: 1 %
Eosinophils Absolute: 0.2 10*3/uL (ref 0.0–0.5)
Eosinophils Relative: 3 %
HCT: 40.6 % (ref 39.0–52.0)
Hemoglobin: 13.8 g/dL (ref 13.0–17.0)
Immature Granulocytes: 0 %
Lymphocytes Relative: 29 %
Lymphs Abs: 1.8 10*3/uL (ref 0.7–4.0)
MCH: 29.4 pg (ref 26.0–34.0)
MCHC: 34 g/dL (ref 30.0–36.0)
MCV: 86.6 fL (ref 80.0–100.0)
Monocytes Absolute: 0.5 10*3/uL (ref 0.1–1.0)
Monocytes Relative: 8 %
Neutro Abs: 3.7 10*3/uL (ref 1.7–7.7)
Neutrophils Relative %: 59 %
Platelets: 191 10*3/uL (ref 150–400)
RBC: 4.69 MIL/uL (ref 4.22–5.81)
RDW: 12.7 % (ref 11.5–15.5)
WBC: 6.2 10*3/uL (ref 4.0–10.5)
nRBC: 0 % (ref 0.0–0.2)

## 2020-06-17 LAB — BASIC METABOLIC PANEL
Anion gap: 9 (ref 5–15)
BUN: 17 mg/dL (ref 8–23)
CO2: 24 mmol/L (ref 22–32)
Calcium: 9 mg/dL (ref 8.9–10.3)
Chloride: 106 mmol/L (ref 98–111)
Creatinine, Ser: 1.24 mg/dL (ref 0.61–1.24)
GFR, Estimated: >60 mL/min (ref >60)
Glucose, Bld: 147 mg/dL — ABNORMAL HIGH (ref 70–99)
Potassium: 3.8 mmol/L (ref 3.5–5.1)
Sodium: 139 mmol/L (ref 135–145)

## 2020-06-17 LAB — RESP PANEL BY RT-PCR (FLU A&B, COVID) ARPGX2
Influenza A by PCR: NEGATIVE
Influenza B by PCR: NEGATIVE
SARS Coronavirus 2 by RT PCR: NEGATIVE

## 2020-06-17 LAB — MAGNESIUM: Magnesium: 2.2 mg/dL (ref 1.7–2.4)

## 2020-06-17 SURGERY — LITHOTRIPSY, ESWL
Anesthesia: LOCAL | Laterality: Right

## 2020-06-17 MED ORDER — DIAZEPAM 5 MG PO TABS
10.0000 mg | ORAL_TABLET | ORAL | Status: AC
  Administered 2020-06-17: 10 mg via ORAL

## 2020-06-17 MED ORDER — HYDROCHLOROTHIAZIDE 25 MG PO TABS: 25.0000 mg | ORAL_TABLET | Freq: Every day | ORAL | 0 refills | Status: DC

## 2020-06-17 MED ORDER — DIPHENHYDRAMINE HCL 25 MG PO CAPS
25.0000 mg | ORAL_CAPSULE | ORAL | Status: AC
  Administered 2020-06-17: 25 mg via ORAL

## 2020-06-17 MED ORDER — DIAZEPAM 5 MG PO TABS
ORAL_TABLET | ORAL | Status: AC
  Filled 2020-06-17: qty 2

## 2020-06-17 MED ORDER — CIPROFLOXACIN HCL 500 MG PO TABS
500.0000 mg | ORAL_TABLET | ORAL | Status: AC
  Administered 2020-06-17: 500 mg via ORAL

## 2020-06-17 MED ORDER — CIPROFLOXACIN HCL 500 MG PO TABS
ORAL_TABLET | ORAL | Status: AC
  Filled 2020-06-17: qty 1

## 2020-06-17 MED ORDER — SODIUM CHLORIDE 0.9 % IV SOLN: INTRAVENOUS | Status: DC

## 2020-06-17 MED ORDER — DIPHENHYDRAMINE HCL 25 MG PO CAPS
ORAL_CAPSULE | ORAL | Status: AC
  Filled 2020-06-17: qty 1

## 2020-06-17 NOTE — Interval H&P Note (Signed)
History and Physical Interval Note:  06/17/2020 11:53 AM  Grant Bowers  has presented today for surgery, with the diagnosis of RIGHT URETERAL STONE.  The various methods of treatment have been discussed with the patient and family. After consideration of risks, benefits and other options for treatment, the patient has consented to  Procedure(s): EXTRACORPOREAL SHOCK WAVE LITHOTRIPSY (ESWL) (Right) as a surgical intervention.  The patient's history has been reviewed, patient examined, no change in status, stable for surgery.  I have reviewed the patient's chart and labs.  Questions were answered to the patient's satisfaction.     Scott A Macdiarmid

## 2020-06-17 NOTE — ED Triage Notes (Signed)
Pt arriving from [facility]. Was schedule for lithotripsy today. Surgery center found pt to be bradycardic with new heart block. Dx COVID + 6 weeks ago per pt. Pt denies sweats, but states he does feel lightheaded and shob "sometimes"

## 2020-06-17 NOTE — Consult Note (Signed)
Called by ED for patient who presented to a pre-operative appointment and profoundly bradycardic.  He is asymptomatic.  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  Recommend stopping home labetalol.  Increase HCTZ to 25mg .  We will arrange for outpatient EP follow up.  Given that he is completely asymptomatic, no inpatient hospitalization indicated.   Grant Bowers C. Oval Linsey, MD, [facility] 06/17/2020 4:43 PM

## 2020-06-17 NOTE — ED Provider Notes (Signed)
[facility] DEPT Provider Note   CSN: [id number] Arrival date & time: 06/17/20  1523     History Chief Complaint  Patient presents with  . Bradycardia    Cecil Vandyke is a 75 y.o. male.  Presented to the ER with concern for bradycardia.  Patient reports that he has a long history of high blood pressure, has been managed on amlodipine, labetalol for many years.  6 weeks ago had COVID and his blood pressure had increased.  His primary doctor started him on a fluid pill.  His blood pressure improved.  He has had some shortness of breath ever since having COVID.  Denies any new acute complaints today.  Had elective lithotripsy scheduled today.  While in preop, patient was noted to have bradycardia.  Sent to ER for further evaluation.  Patient denies any prior history of cardiac issues, denies having any heart block previously.  No chest pain, difficulty breathing, lightheadedness at present.  No syncope.  HPI     Past Medical History:  Diagnosis Date  . Allergy   . Carotid artery stenosis 09/23/2015  . GERD 04/08/2008  . Heart murmur    as a child only   . Hyperlipidemia    om meds and borderline numbers with labs   . HYPERTENSION 04/08/2008  . 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 PSA, INCREASED 04/08/2008  . RENAL CALCULUS 02/11/2010  . Sleep apnea    uses CPAP    Patient Active Problem List   Diagnosis Date Noted  . Allergic rhinitis 10/31/2018  . Paresthesias 08/08/2016  . Hyperlipidemia 09/26/2015  . Hyperglycemia 09/23/2015  . Carotid artery stenosis 09/23/2015  . Abnormal blood chemistry 09/18/2014  . 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  . Vertigo 09/13/2011  . Encounter for well adult exam with abnormal findings 03/11/2011  . NONSPECIFIC ABNORMAL UNSPEC CV FUNCTION STUDY 02/15/2010  . RENAL CALCULUS 02/11/2010  . ELECTROCARDIOGRAM, ABNORMAL 02/11/2010  . 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  . Hypertension, uncontrolled 04/08/2008  . GERD 04/08/2008   . PSA, INCREASED 04/08/2008  . 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 collasped lung    . COLONOSCOPY  12/07/2006   [facility] in [place]   . EXTRACORPOREAL SHOCK WAVE LITHOTRIPSY Right 04/29/2020   Procedure: EXTRACORPOREAL SHOCK WAVE LITHOTRIPSY (ESWL);  Surgeon: Robley Fries, MD;  Location: [facility];  Service: Urology;  Laterality: Right;  . KIDNEY STONE SURGERY         Family History  Problem Relation Age of Onset  . Pancreatic cancer Mother   . Colon polyps Brother   . Colon cancer Neg Hx   . Esophageal cancer Neg Hx   . Rectal cancer Neg Hx   . Stomach cancer Neg Hx     Social History   Tobacco Use  . Smoking status: Never Smoker  . Smokeless tobacco: Never Used  Substance Use Topics  . Alcohol use: Yes    Alcohol/week: 0.0 standard drinks    Comment: very rare   . Drug use: Never    Home Medications Prior to Admission medications   Medication Sig Start Date End Date Taking? Authorizing Provider  amLODipine (NORVASC) 10 MG tablet TAKE 1 TABLET EVERY DAY 03/17/20  Yes Biagio Borg, MD  aspirin 81 MG tablet Take 81 mg by mouth daily.   Yes [provider]  cetirizine (ZYRTEC) 10 MG tablet TAKE 1 TABLET EVERY DAY 09/25/19  Yes Biagio Borg, MD  fluticasone (FLONASE) 50 MCG/ACT nasal spray USE 2 SPRAYS IN EACH NOSTRIL EVERY DAY 08/21/17  Yes Biagio Borg, MD  irbesartan (AVAPRO) 300 MG tablet TAKE 1 TABLET AT BEDTIME Patient taking differently: Take 300 mg by mouth daily. 03/23/20  Yes Biagio Borg, MD  Multiple Vitamins-Minerals (CENTRUM ADULTS) TABS Take 1 tablet by mouth daily.   Yes [provider]  omeprazole (PRILOSEC) 10 MG capsule TAKE 1 CAPSULE EVERY DAY 03/17/20  Yes Biagio Borg, MD  rosuvastatin (CRESTOR) 20 MG tablet TAKE 1 TABLET EVERY DAY [facility] APPOINTMENT DUE IN JULY MUST SEE PROVIDER FOR FURTHER REFILLS ) 05/21/20  Yes Biagio Borg, MD  tamsulosin  (FLOMAX) 0.4 MG CAPS capsule TAKE 1 CAPSULE EVERY DAY 12/10/18  Yes Biagio Borg, MD  labetalol (NORMODYNE) 300 MG tablet TAKE 1 TABLET TWICE DAILY 03/17/20 06/17/20 Yes Biagio Borg, MD  Alcohol Swabs (B-D SINGLE USE SWABS BUTTERFLY) PADS USE 2X A DAY TO CHECK BLOOD SUGARS. DX:R73.9 03/29/20   Biagio Borg, MD  Blood Glucose Monitoring Suppl (TRUE METRIX METER) w/Device KIT USE AS DIRECTED 05/31/20   Biagio Borg, MD  glucose blood test strip Use as instructed 2X A DAY TO TEST BLOOD SUGARS. DX: R73.9 03/29/20   Biagio Borg, MD  hydrochlorothiazide (HYDRODIURIL) 25 MG tablet Take 1 tablet (25 mg total) by mouth daily. 06/17/20   Lucrezia Starch, MD  HYDROcodone-acetaminophen (NORCO/VICODIN) 5-325 MG tablet Take 1 tablet by mouth every 4 (four) hours as needed for moderate pain. Patient not taking: Reported on 06/17/2020 04/29/20 04/29/21  Robley Fries, MD  Lancets 33G MISC USE 2X A DAY TO CHECK BLOOD SUGARS. DX:R73.9 03/29/20   Biagio Borg, MD  sildenafil (REVATIO) 20 MG tablet 3-5 tabs by mouth every other day as needed Patient taking differently: Take 20-40 mg by mouth daily as needed (ED). 03/25/14   Biagio Borg, MD    Allergies    Patient has no known allergies.  Review of Systems   Review of Systems  Constitutional: Negative for chills and fever.  HENT: Negative for ear pain and sore throat.   Eyes: Negative for pain and visual disturbance.  Respiratory: Negative for cough and shortness of breath.   Cardiovascular: Negative for chest pain and palpitations.  Gastrointestinal: Negative for abdominal pain and vomiting.  Genitourinary: Negative for dysuria and hematuria.  Musculoskeletal: Negative for arthralgias and back pain.  Skin: Negative for color change and rash.  Neurological: Negative for seizures and syncope.  All other systems reviewed and are negative.   Physical Exam Updated Vital Signs BP (!) 151/69   Pulse (!) 37   Temp 97.6 F (36.4 C) (Oral)   Resp 19    Ht 6' (1.829 m)   Wt 96.6 kg   SpO2 100%   BMI 28.88 kg/m   Physical Exam Vitals and nursing note reviewed.  Constitutional:      Appearance: He is well-developed.  HENT:     Head: Normocephalic and atraumatic.  Eyes:     Conjunctiva/sclera: Conjunctivae normal.  Cardiovascular:     Rate and Rhythm: Regular rhythm. Bradycardia present.     Heart sounds: No murmur heard.   Pulmonary:     Effort: Pulmonary effort is normal. No respiratory distress.     Breath sounds: Normal breath sounds.  Abdominal:     Palpations: Abdomen is soft.     Tenderness: There is no abdominal tenderness.  Musculoskeletal:  Cervical back: Neck supple.  Skin:    General: Skin is warm and dry.  Neurological:     General: No focal deficit present.     Mental Status: He is alert.  Psychiatric:        Mood and Affect: Mood normal.        Behavior: Behavior normal.     ED Results / Procedures / Treatments   Labs (all labs ordered are listed, but only abnormal results are displayed) Labs Reviewed  BASIC METABOLIC PANEL - Abnormal; Notable for the following components:      Result Value   Glucose, Bld 147 (*)    All other components within normal limits  RESP PANEL BY RT-PCR (FLU A&B, COVID) ARPGX2  CBC WITH DIFFERENTIAL/PLATELET  MAGNESIUM    EKG EKG Interpretation  Date/Time:  Thursday June 17 2020 16:36:49 EST Ventricular Rate:  63 PR Interval:    QRS Duration: 147 QT Interval:  660 QTC Calculation: 676 R Axis:   -72 Text Interpretation: Sinus rhythm Short PR interval IVCD, consider atypical RBBB Probable anterior infarct, age indeterminate Lateral leads are also involved Confirmed by Madalyn Rob [phone]) on 06/17/2020 5:12:44 PM   Radiology No results found.  Procedures Procedures   Medications Ordered in ED Medications - No data to display  ED Course  I have reviewed the triage vital signs and the nursing notes.  Pertinent labs & imaging results that were  available during my care of the patient were reviewed by me and considered in my medical decision making (see chart for details).  Clinical Course as of 06/17/20 1716  Thu Jun 17, 2020  1641 Discussed case with Dr. Oval Linsey with cardiology, she feels this is most likely Mobitz type I, she recommends close outpatient follow-up with EP, stopping labetalol, increasing HCTZ.  Given patient is currently asymptomatic and blood pressure stable, she feels he would be stable for outpatient treatment and does not require admission [RD]    Clinical Course User Index [RD] Lucrezia Starch, MD   MDM Rules/Calculators/A&P                          75 year old male presents to ER with concern for bradycardia.  Currently asymptomatic, blood pressure stable.  Reviewed with cardiology, Dr. Oval Linsey.  She believes patient is most likely in sinus rhythm, second-degree heart block Mobitz type I.  She recommends stopping labetalol, increasing HCTZ and close EP outpatient follow-up.  On reassessment, patient remains well-appearing with stable blood pressure.  Reviewed these instructions in detail.  Cardiology made appointment for patient on March 21.  I recommended patient also follow-up within the next few days with his primary doctor for recheck of blood pressure and heart rate in lieu of these medication changes.    After the discussed management above, the patient was determined to be safe for discharge.  The patient was in agreement with this plan and all questions regarding their care were answered.  ED return precautions were discussed and the patient will return to the ED with any significant worsening of condition.     Final Clinical Impression(s) / ED Diagnoses Final diagnoses:  Bradycardia    Rx / DC Orders ED Discharge Orders         Ordered    hydrochlorothiazide (HYDRODIURIL) 25 MG tablet  Daily        06/17/20 1709           Lucrezia Starch, MD 06/17/20  [place]

## 2020-06-17 NOTE — ED Notes (Signed)
Coming from surgery center-unable to perform procedure due to a new heart block

## 2020-06-17 NOTE — Discharge Instructions (Addendum)
STOP taking labetalol. Increase HCTZ to 25mg  once daily.   Please go to the appointment with the cardiology electrophysiologist, Dr. Rayann Heman on March 21 at 12 PM.  I would additionally recommend close recheck with your primary doctor for monitoring of your blood pressure and heart rate.  If in the meantime, he develops any lightheadedness, episodes of passing out, chest pain or difficulty in breathing, return immediately to ER for reassessment.   Future Appointments  Date Time Provider [facility]  06/28/2020 12:00 PM Thompson Grayer, MD [facility]  11/11/2020 10:00 AM Biagio Borg, MD [facility] None

## 2020-06-17 NOTE — Progress Notes (Signed)
Pt transfer to [facility] ED via wheelchair on portable heart monitor, 2LNC. Denies CP, SOB. Janett Billow RN at bedside. Report given.

## 2020-06-18 DIAGNOSIS — G4731 Primary central sleep apnea: Secondary | ICD-10-CM | POA: Diagnosis not present

## 2020-06-28 ENCOUNTER — Other Ambulatory Visit: Payer: Self-pay

## 2020-06-28 ENCOUNTER — Encounter: Payer: Self-pay | Admitting: Internal Medicine

## 2020-06-28 ENCOUNTER — Ambulatory Visit (INDEPENDENT_AMBULATORY_CARE_PROVIDER_SITE_OTHER): Payer: Medicare PPO | Admitting: Internal Medicine

## 2020-06-28 ENCOUNTER — Ambulatory Visit: Payer: Medicare PPO | Admitting: Internal Medicine

## 2020-06-28 ENCOUNTER — Encounter: Payer: Self-pay | Admitting: *Deleted

## 2020-06-28 VITALS — BP 158/62 | HR 53 | Ht 72.0 in | Wt 215.4 lb

## 2020-06-28 DIAGNOSIS — I441 Atrioventricular block, second degree: Secondary | ICD-10-CM | POA: Diagnosis not present

## 2020-06-28 DIAGNOSIS — I447 Left bundle-branch block, unspecified: Secondary | ICD-10-CM | POA: Diagnosis not present

## 2020-06-28 DIAGNOSIS — R001 Bradycardia, unspecified: Secondary | ICD-10-CM | POA: Diagnosis not present

## 2020-06-28 DIAGNOSIS — I453 Trifascicular block: Secondary | ICD-10-CM

## 2020-06-28 NOTE — Progress Notes (Signed)
Electrophysiology Office Note   Date:  06/28/2020   ID:  Grant Bowers, DOB 1945/09/24, MRN [id number]  PCP:  Biagio Borg, MD  Cardiologist:  Dr Gibson Ramp  Primary Electrophysiologist: Thompson Grayer, MD    CC: fatigue   History of Present Illness: Grant Bowers is a 75 y.o. male who presents today for electrophysiology evaluation.   He presents for EP evaluation after recent ER visit showing second degree AV block.   He is active.  + fatigue and SOB.  This has been presents since having COVID 6 weeks ago.  + mild dizziness with bending over or walking a long distance.  Today, he denies symptoms of palpitations, chest pain,  lower extremity edema, claudication,  presyncope, syncope, bleeding, or neurologic sequela. The patient is tolerating medications without difficulties and is otherwise without complaint today.    Past Medical History:  Diagnosis Date  . Carotid artery stenosis 09/23/2015  . GERD 04/08/2008  . Heart murmur    as a child only   . Hyperlipidemia    om meds and borderline numbers with labs   . Hypertension   . 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 PSA, INCREASED 04/08/2008  . RENAL CALCULUS 02/11/2010  . Sleep apnea    uses CPAP   Past Surgical History:  Procedure Laterality Date  . CARDIAC CATHETERIZATION  2017   Cath at [facility] showed no CAD (per patient)  . collasped lung    . COLONOSCOPY  12/07/2006   [facility] in [place]   . EXTRACORPOREAL SHOCK WAVE LITHOTRIPSY Right 04/29/2020   Procedure: EXTRACORPOREAL SHOCK WAVE LITHOTRIPSY (ESWL);  Surgeon: Robley Fries, MD;  Location: [facility];  Service: Urology;  Laterality: Right;  . KIDNEY STONE SURGERY       Current Outpatient Medications  Medication Sig Dispense Refill  . Alcohol Swabs (B-D SINGLE USE SWABS BUTTERFLY) PADS USE 2X A DAY TO CHECK BLOOD SUGARS. DX:R73.9 100 each 5  . amLODipine (NORVASC) 10 MG tablet TAKE 1 TABLET EVERY DAY 90 tablet 1  . Blood  Glucose Monitoring Suppl (TRUE METRIX METER) w/Device KIT USE AS DIRECTED 1 kit 0  . cetirizine (ZYRTEC) 10 MG tablet TAKE 1 TABLET EVERY DAY 90 tablet 3  . fluticasone (FLONASE) 50 MCG/ACT nasal spray USE 2 SPRAYS IN EACH NOSTRIL EVERY DAY 48 g 3  . glucose blood test strip Use as instructed 2X A DAY TO TEST BLOOD SUGARS. DX: R73.9 100 each 12  . hydrochlorothiazide (HYDRODIURIL) 25 MG tablet Take 1 tablet (25 mg total) by mouth daily. (Patient taking differently: Take 50 mg by mouth daily.) 30 tablet 0  . HYDROcodone-acetaminophen (NORCO/VICODIN) 5-325 MG tablet Take 1 tablet by mouth every 4 (four) hours as needed for moderate pain. 20 tablet 0  . irbesartan (AVAPRO) 300 MG tablet Take 300 mg by mouth daily.    . Lancets 33G MISC USE 2X A DAY TO CHECK BLOOD SUGARS. DX:R73.9 100 each 3  . Multiple Vitamins-Minerals (CENTRUM ADULTS) TABS Take 1 tablet by mouth daily.    Marland Kitchen omeprazole (PRILOSEC) 10 MG capsule TAKE 1 CAPSULE EVERY DAY 90 capsule 1  . rosuvastatin (CRESTOR) 20 MG tablet TAKE 1 TABLET EVERY DAY ( ANNUAL APPOINTMENT DUE IN JULY MUST SEE PROVIDER FOR FURTHER REFILLS ) 90 tablet 0  . sildenafil (REVATIO) 20 MG tablet 3-5 tabs by mouth every other day as needed 60 tablet 2  . tamsulosin (FLOMAX) 0.4 MG CAPS capsule TAKE 1 CAPSULE EVERY DAY 90  capsule 3   No current facility-administered medications for this visit.    Allergies:   Patient has no known allergies.   Social History:  The patient  reports that he has never smoked. He has never used smokeless tobacco. He reports current alcohol use. He reports that he does not use drugs.   Family History:  The patient's  family history includes Colon polyps in his brother; Pancreatic cancer in his mother.    ROS:  Please see the history of present illness.   All other systems are personally reviewed and negative.    PHYSICAL EXAM: VS:  BP (!) 158/62   Pulse (!) 53   Ht 6' (1.829 m)   Wt 215 lb 6.4 oz (97.7 kg)   SpO2 96%   BMI  29.21 kg/m  , BMI Body mass index is 29.21 kg/m. GEN: Well nourished, well developed, in no acute distress HEENT: normal Neck: no JVD, carotid bruits, or masses Cardiac: bradycardic irregular RRR; 2/6 SEM LUSB which is mid peaking Respiratory:  clear to auscultation bilaterally, normal work of breathing GI: soft, nontender, nondistended, + BS MS: no deformity or atrophy Skin: warm and dry  Neuro:  Strength and sensation are intact Psych: euthymic mood, full affect  EKG:  EKG is ordered today. The ekg ordered today is personally reviewed and shows sinus rhythm,  Trifascicular block (both RBBB and LBBBs) as well as 2:1 AV conduction   Recent Labs: 06/01/2020: ALT 11; TSH 2.43 06/17/2020: BUN 17; Creatinine, Ser 1.24; Hemoglobin 13.8; Magnesium 2.2; Platelets 191; Potassium 3.8; Sodium 139  personally reviewed   Lipid Panel     Component Value Date/Time   CHOL 128 06/01/2020 1113   TRIG 101.0 06/01/2020 1113   HDL 44.10 06/01/2020 1113   CHOLHDL 3 06/01/2020 1113   VLDL 20.2 06/01/2020 1113   LDLCALC 64 06/01/2020 1113   LDLCALC 57 11/06/2019 1157   LDLDIRECT 150.3 02/11/2010 1211   personally reviewed   Wt Readings from Last 3 Encounters:  06/28/20 215 lb 6.4 oz (97.7 kg)  06/17/20 212 lb 15.4 oz (96.6 kg)  06/17/20 213 lb (96.6 kg)      Other studies personally reviewed: Additional studies/ records that were reviewed today include: recent ER visit and Dr Quintella Reichert notes  Review of the above records today demonstrates: as above   ASSESSMENT AND PLAN:  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  This is due to second degree AV block.  He has advanced conduction system disease.  He has had washout of labetalol for 2 weeks without resolution.  I would therefore recommend pacemaker implantation..  Risks, benefits, alternatives to pacemaker implantation were discussed in detail with the patient today. The patient understands that  the risks include but are not limited to bleeding, infection, pneumothorax, perforation, tamponade, vascular damage, renal failure, MI, stroke, death,  and lead dislodgement and wishes to proceed. We will therefore schedule the procedure at the next available time.  Dr Caryl Comes met the patient today.  Due to scheduling, Dr Caryl Comes will perform the procedure.  He will likely attempt left bundle pacing.    SignedThompson Grayer, MD  06/28/2020 4:45 PM     [address] [place] [address] [phone] (office) [phone] (fax)

## 2020-06-28 NOTE — H&P (View-Only) (Signed)
 Electrophysiology Office Note   Date:  06/28/2020   ID:  Grant Bowers, DOB 07/11/1945, MRN [id number]  PCP:  John, Heide Brossart W, MD  Cardiologist:  Dr Lingle  Primary Electrophysiologist: Murry Diaz, MD    CC: fatigue   History of Present Illness: Grant Bowers is a 74 y.o. male who presents today for electrophysiology evaluation.   He presents for EP evaluation after recent ER visit showing second degree AV block.   He is active.  + fatigue and SOB.  This has been presents since having COVID 6 weeks ago.  + mild dizziness with bending over or walking a long distance.  Today, he denies symptoms of palpitations, chest pain,  lower extremity edema, claudication,  presyncope, syncope, bleeding, or neurologic sequela. The patient is tolerating medications without difficulties and is otherwise without complaint today.    Past Medical History:  Diagnosis Date  . Carotid artery stenosis 09/23/2015  . GERD 04/08/2008  . Heart murmur    as a child only   . Hyperlipidemia    om meds and borderline numbers with labs   . Hypertension   . 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 PSA, INCREASED 04/08/2008  . RENAL CALCULUS 02/11/2010  . Sleep apnea    uses CPAP   Past Surgical History:  Procedure Laterality Date  . CARDIAC CATHETERIZATION  2017   Cath at [facility] showed no CAD (per patient)  . collasped lung    . COLONOSCOPY  12/07/2006   [facility] in [place]   . EXTRACORPOREAL SHOCK WAVE LITHOTRIPSY Right 04/29/2020   Procedure: EXTRACORPOREAL SHOCK WAVE LITHOTRIPSY (ESWL);  Surgeon: Pace, Maryellen D, MD;  Location: [facility];  Service: Urology;  Laterality: Right;  . KIDNEY STONE SURGERY       Current Outpatient Medications  Medication Sig Dispense Refill  . Alcohol Swabs (B-D SINGLE USE SWABS BUTTERFLY) PADS USE 2X A DAY TO CHECK BLOOD SUGARS. DX:R73.9 100 each 5  . amLODipine (NORVASC) 10 MG tablet TAKE 1 TABLET EVERY DAY 90 tablet 1  . Blood  Glucose Monitoring Suppl (TRUE METRIX METER) w/Device KIT USE AS DIRECTED 1 kit 0  . cetirizine (ZYRTEC) 10 MG tablet TAKE 1 TABLET EVERY DAY 90 tablet 3  . fluticasone (FLONASE) 50 MCG/ACT nasal spray USE 2 SPRAYS IN EACH NOSTRIL EVERY DAY 48 g 3  . glucose blood test strip Use as instructed 2X A DAY TO TEST BLOOD SUGARS. DX: R73.9 100 each 12  . hydrochlorothiazide (HYDRODIURIL) 25 MG tablet Take 1 tablet (25 mg total) by mouth daily. (Patient taking differently: Take 50 mg by mouth daily.) 30 tablet 0  . HYDROcodone-acetaminophen (NORCO/VICODIN) 5-325 MG tablet Take 1 tablet by mouth every 4 (four) hours as needed for moderate pain. 20 tablet 0  . irbesartan (AVAPRO) 300 MG tablet Take 300 mg by mouth daily.    . Lancets 33G MISC USE 2X A DAY TO CHECK BLOOD SUGARS. DX:R73.9 100 each 3  . Multiple Vitamins-Minerals (CENTRUM ADULTS) TABS Take 1 tablet by mouth daily.    . omeprazole (PRILOSEC) 10 MG capsule TAKE 1 CAPSULE EVERY DAY 90 capsule 1  . rosuvastatin (CRESTOR) 20 MG tablet TAKE 1 TABLET EVERY DAY ( ANNUAL APPOINTMENT DUE IN JULY MUST SEE PROVIDER FOR FURTHER REFILLS ) 90 tablet 0  . sildenafil (REVATIO) 20 MG tablet 3-5 tabs by mouth every other day as needed 60 tablet 2  . tamsulosin (FLOMAX) 0.4 MG CAPS capsule TAKE 1 CAPSULE EVERY DAY 90   capsule 3   No current facility-administered medications for this visit.    Allergies:   Patient has no known allergies.   Social History:  The patient  reports that he has never smoked. He has never used smokeless tobacco. He reports current alcohol use. He reports that he does not use drugs.   Family History:  The patient's  family history includes Colon polyps in his brother; Pancreatic cancer in his mother.    ROS:  Please see the history of present illness.   All other systems are personally reviewed and negative.    PHYSICAL EXAM: VS:  BP (!) 158/62   Pulse (!) 53   Ht 6' (1.829 m)   Wt 215 lb 6.4 oz (97.7 kg)   SpO2 96%   BMI  29.21 kg/m  , BMI Body mass index is 29.21 kg/m. GEN: Well nourished, well developed, in no acute distress HEENT: normal Neck: no JVD, carotid bruits, or masses Cardiac: bradycardic irregular RRR; 2/6 SEM LUSB which is mid peaking Respiratory:  clear to auscultation bilaterally, normal work of breathing GI: soft, nontender, nondistended, + BS MS: no deformity or atrophy Skin: warm and dry  Neuro:  Strength and sensation are intact Psych: euthymic mood, full affect  EKG:  EKG is ordered today. The ekg ordered today is personally reviewed and shows sinus rhythm,  Trifascicular block (both RBBB and LBBBs) as well as 2:1 AV conduction   Recent Labs: 06/01/2020: ALT 11; TSH 2.43 06/17/2020: BUN 17; Creatinine, Ser 1.24; Hemoglobin 13.8; Magnesium 2.2; Platelets 191; Potassium 3.8; Sodium 139  personally reviewed   Lipid Panel     Component Value Date/Time   CHOL 128 06/01/2020 1113   TRIG 101.0 06/01/2020 1113   HDL 44.10 06/01/2020 1113   CHOLHDL 3 06/01/2020 1113   VLDL 20.2 06/01/2020 1113   LDLCALC 64 06/01/2020 1113   LDLCALC 57 11/06/2019 1157   LDLDIRECT 150.3 02/11/2010 1211   personally reviewed   Wt Readings from Last 3 Encounters:  06/28/20 215 lb 6.4 oz (97.7 kg)  06/17/20 212 lb 15.4 oz (96.6 kg)  06/17/20 213 lb (96.6 kg)      Other studies personally reviewed: Additional studies/ records that were reviewed today include: recent ER visit and Dr Randolphs notes  Review of the above records today demonstrates: as above   ASSESSMENT AND PLAN:  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  This is due to second degree AV block.  He has advanced conduction system disease.  He has had washout of labetalol for 2 weeks without resolution.  I would therefore recommend pacemaker implantation..  Risks, benefits, alternatives to pacemaker implantation were discussed in detail with the patient today. The patient understands that  the risks include but are not limited to bleeding, infection, pneumothorax, perforation, tamponade, vascular damage, renal failure, MI, stroke, death,  and lead dislodgement and wishes to proceed. We will therefore schedule the procedure at the next available time.  Dr Klein met the patient today.  Due to scheduling, Dr Klein will perform the procedure.  He will likely attempt left bundle pacing.    Signed, Binnie Droessler, MD  06/28/2020 4:45 PM     [facility] [address] [place] [address] [phone] (office) [phone] (fax)  

## 2020-06-28 NOTE — Patient Instructions (Addendum)
Medication Instructions: Stop you Aspirin  Your physician recommends that you continue on your current medications as directed. Please refer to the Current Medication list given to you today.  Labwork: CBC, BMP  Testing/Procedures: Your physician has requested that you have an echocardiogram. Echocardiography is a painless test that uses sound waves to create images of your heart. It provides your doctor with information about the size and shape of your heart and how well your heart's chambers and valves are working. This procedure takes approximately one hour. There are no restrictions for this procedure.  Your physician has recommended that you have a pacemaker inserted. A pacemaker is a small device that is placed under the skin of your chest or abdomen to help control abnormal heart rhythms. This device uses electrical pulses to prompt the heart to beat at a normal rate. Pacemakers are used to treat heart rhythms that are too slow. Wire (leads) are attached to the pacemaker that goes into the chambers of you heart. This is done in the hospital and usually requires and overnight stay. Please see the instruction sheet given to you today for more information.   Any Other Special Instructions Will Be Listed Below (If Applicable).  If you need a refill on your cardiac medications before your next appointment, please call your pharmacy.      Pacemaker Implantation, Adult Pacemaker implantation is a procedure to place a pacemaker inside the chest. A pacemaker is a small computer that sends electrical signals to the heart and helps the heart beat normally. A pacemaker also stores information about heart rhythms. You may need pacemaker implantation if you have:  A slow heartbeat (bradycardia).  Loss of consciousness that happens repeatedly (syncope) or repeated episodes of dizziness or light-headedness because of an irregular heart rate.  Shortness of breath (dyspnea) due to heart problems. The  pacemaker usually attaches to your heart through a wire called a lead. One or two leads may be needed. There are different types of pacemakers:  Transvenous pacemaker. This type is placed under the skin or muscle of your upper chest area. The lead goes through a vein in the chest area to reach the inside of the heart.  Epicardial pacemaker. This type is placed under the skin or muscle of your chest or abdomen. The lead goes through your chest to the outside of the heart. Tell a health care provider about:  Any allergies you have.  All medicines you are taking, including vitamins, herbs, eye drops, creams, and over-the-counter medicines.  Any problems you or family members have had with anesthetic medicines.  Any blood or bone disorders you have.  Any surgeries you have had.  Any medical conditions you have.  Whether you are pregnant or may be pregnant. What are the risks? Generally, this is a safe procedure. However, problems may occur, including:  Infection.  Bleeding.  Failure of the pacemaker or the lead.  Collapse of a lung or bleeding into a lung.  Blood clot inside a blood vessel with a lead.  Damage to the heart.  Infection inside the heart (endocarditis).  Allergic reactions to medicines. What happens before the procedure? Staying hydrated Follow instructions from your health care provider about hydration, which may include:  Up to 2 hours before the procedure - you may continue to drink clear liquids, such as water, clear fruit juice, black coffee, and plain tea.   Eating and drinking restrictions Follow instructions from your health care provider about eating and drinking, which may  include:  8 hours before the procedure - stop eating heavy meals or foods, such as meat, fried foods, or fatty foods.  6 hours before the procedure - stop eating light meals or foods, such as toast or cereal.  6 hours before the procedure - stop drinking milk or drinks that  contain milk.  2 hours before the procedure - stop drinking clear liquids. Medicines Ask your health care provider about:  Changing or stopping your regular medicines. This is especially important if you are taking diabetes medicines or blood thinners.  Taking medicines such as aspirin and ibuprofen. These medicines can thin your blood. Do not take these medicines unless your health care provider tells you to take them.  Taking over-the-counter medicines, vitamins, herbs, and supplements. Tests You may have:  A heart evaluation. This may include: ? An electrocardiogram (ECG). This involves placing patches on your skin to check your heart rhythm. ? A chest X-ray. ? An echocardiogram. This is a test that uses sound waves (ultrasound) to produce an image of the heart. ? A cardiac rhythm monitor. This is used to record your heart rhythm and any events for a longer period of time.  Blood tests.  Genetic testing. General instructions  Do not use any products that contain nicotine or tobacco for at least 4 weeks before the procedure. These products include cigarettes, e-cigarettes, and chewing tobacco. If you need help quitting, ask your health care provider.  Ask your health care provider: ? How your surgery site will be marked. ? What steps will be taken to help prevent infection. These steps may include:  Removing hair at the surgery site.  Washing skin with a germ-killing soap.  Receiving antibiotic medicine.  Plan to have someone take you home from the hospital or clinic.  If you will be going home right after the procedure, plan to have someone with you for 24 hours. What happens during the procedure?  An IV will be inserted into one of your veins.  You will be given one or more of the following: ? A medicine to help you relax (sedative). ? A medicine to numb the area (local anesthetic). ? A medicine to make you fall asleep (general anesthetic).  The next steps vary  depending on the type of pacemaker you will be getting. ? If you are getting a transvenous pacemaker:  An incision will be made in your upper chest.  A pocket will be made for the pacemaker. It may be placed under the skin or between layers of muscle.  The lead will be inserted into a blood vessel that goes to the heart.  While X-rays are taken by an imaging machine (fluoroscopy), the lead will be advanced through the vein to the inside of your heart.  The other end of the lead will be tunneled under the skin and attached to the pacemaker. ? If you are getting an epicardial pacemaker:  An incision will be made near your ribs or breastbone (sternum) for the lead.  The lead will be attached to the outside of your heart.  Another incision will be made in your chest or upper abdomen to create a pocket for the pacemaker.  The free end of the lead will be tunneled under the skin and attached to the pacemaker.  The transvenous or epicardial pacemaker will be tested. Imaging studies may be done to check the lead position.  The incisions will be closed with stitches (sutures), adhesive strips, or skin glue.  Bandages (  dressings) will be placed over the incisions. The procedure may vary among health care providers and hospitals. What happens after the procedure?  Your blood pressure, heart rate, breathing rate, and blood oxygen level will be monitored until you leave the hospital or clinic.  You may be given antibiotics.  You will be given pain medicine.  An ECG and chest X-rays will be done.  You may need to wear a continuous type of ECG (Holter monitor) to check your heart rhythm.  Your health care provider will program the pacemaker.  If you were given a sedative during the procedure, it can affect you for several hours. Do not drive or operate machinery until your health care provider says that it is safe.  You will be given a pacemaker identification card. This card lists the  implant date, device model, and manufacturer of your pacemaker. Summary  A pacemaker is a small computer that sends electrical signals to the heart and helps the heart beat normally.  There are different types of pacemakers. A pacemaker may be placed under the skin or muscle of your chest or abdomen.  Follow instructions from your health care provider about eating and drinking and about taking medicines before the procedure. This information is not intended to replace advice given to you by your health care provider. Make sure you discuss any questions you have with your health care provider. Document Revised: 02/26/2019 Document Reviewed: 02/26/2019 Elsevier Patient Education  2021 [employer].

## 2020-06-29 ENCOUNTER — Telehealth: Payer: Self-pay | Admitting: Internal Medicine

## 2020-06-29 LAB — CBC WITH DIFFERENTIAL/PLATELET
Basophils Absolute: 0 10*3/uL (ref 0.0–0.2)
Basos: 1 %
EOS (ABSOLUTE): 0.2 10*3/uL (ref 0.0–0.4)
Eos: 2 %
Hematocrit: 42.3 % (ref 37.5–51.0)
Hemoglobin: 14.6 g/dL (ref 13.0–17.7)
Immature Grans (Abs): 0 10*3/uL (ref 0.0–0.1)
Immature Granulocytes: 0 %
Lymphocytes Absolute: 2.1 10*3/uL (ref 0.7–3.1)
Lymphs: 29 %
MCH: 29.7 pg (ref 26.6–33.0)
MCHC: 34.5 g/dL (ref 31.5–35.7)
MCV: 86 fL (ref 79–97)
Monocytes Absolute: 0.6 10*3/uL (ref 0.1–0.9)
Monocytes: 9 %
Neutrophils Absolute: 4.3 10*3/uL (ref 1.4–7.0)
Neutrophils: 59 %
Platelets: 227 10*3/uL (ref 150–450)
RBC: 4.92 x10E6/uL (ref 4.14–5.80)
RDW: 13 % (ref 11.6–15.4)
WBC: 7.3 10*3/uL (ref 3.4–10.8)

## 2020-06-29 LAB — BASIC METABOLIC PANEL
BUN/Creatinine Ratio: 16 (ref 10–24)
BUN: 21 mg/dL (ref 8–27)
CO2: 22 mmol/L (ref 20–29)
Calcium: 9.6 mg/dL (ref 8.6–10.2)
Chloride: 105 mmol/L (ref 96–106)
Creatinine, Ser: 1.28 mg/dL — ABNORMAL HIGH (ref 0.76–1.27)
Glucose: 160 mg/dL — ABNORMAL HIGH (ref 65–99)
Potassium: 4.2 mmol/L (ref 3.5–5.2)
Sodium: 145 mmol/L — ABNORMAL HIGH (ref 134–144)
eGFR: 58 mL/min/{1.73_m2} — ABNORMAL LOW (ref >59)

## 2020-06-29 NOTE — Telephone Encounter (Signed)
Patient wants to know which side of his chest Dr. Caryl Comes will be putting in the pacemaker. Wants to know if there is a difference between the two sides. Please call back to discuss

## 2020-06-30 DIAGNOSIS — G4731 Primary central sleep apnea: Secondary | ICD-10-CM | POA: Diagnosis not present

## 2020-06-30 NOTE — Telephone Encounter (Signed)
Spoke with pt who is asking what side his PPM will be implanted as he is right handed and sleeps on his right side.  Pt advised generally the PPM is placed to left of the center of his chest unless MD determines placement needs to be elsewhere. Pt also asking if he should be limiting his activity prior to his implant.  Pt advised he may go about regular activities as tolerated.Reveiwed ED precautions. Pt verbalizes understanding and thanked Therapist, sports for the call.

## 2020-07-07 ENCOUNTER — Other Ambulatory Visit (HOSPITAL_COMMUNITY)
Admission: RE | Admit: 2020-07-07 | Discharge: 2020-07-07 | Disposition: A | Discharge status: Home / Self Care | Payer: Medicare PPO | Source: Ambulatory Visit | Attending: Internal Medicine | Admitting: Internal Medicine

## 2020-07-07 DIAGNOSIS — Z20822 Contact with and (suspected) exposure to covid-19: Secondary | ICD-10-CM | POA: Diagnosis not present

## 2020-07-07 DIAGNOSIS — Z01812 Encounter for preprocedural laboratory examination: Secondary | ICD-10-CM | POA: Diagnosis not present

## 2020-07-07 LAB — SARS CORONAVIRUS 2 (TAT 6-24 HRS): SARS Coronavirus 2: NEGATIVE

## 2020-07-09 ENCOUNTER — Ambulatory Visit (HOSPITAL_COMMUNITY): Payer: Medicare PPO

## 2020-07-09 ENCOUNTER — Ambulatory Visit (HOSPITAL_COMMUNITY)
Admission: RE | Admit: 2020-07-09 | Discharge: 2020-07-09 | Disposition: A | Discharge status: Home / Self Care | Payer: Medicare PPO | Attending: Internal Medicine | Admitting: Internal Medicine

## 2020-07-09 ENCOUNTER — Encounter (HOSPITAL_COMMUNITY)
Admission: RE | Disposition: A | Discharge status: Home / Self Care | Payer: Self-pay | Source: Home / Self Care | Attending: Internal Medicine

## 2020-07-09 ENCOUNTER — Other Ambulatory Visit: Payer: Self-pay

## 2020-07-09 DIAGNOSIS — Z8616 Personal history of COVID-19: Secondary | ICD-10-CM | POA: Diagnosis not present

## 2020-07-09 DIAGNOSIS — I453 Trifascicular block: Secondary | ICD-10-CM | POA: Diagnosis not present

## 2020-07-09 DIAGNOSIS — R0602 Shortness of breath: Secondary | ICD-10-CM | POA: Diagnosis not present

## 2020-07-09 DIAGNOSIS — I700 Atherosclerosis of aorta: Secondary | ICD-10-CM | POA: Diagnosis not present

## 2020-07-09 DIAGNOSIS — Z79899 Other long term (current) drug therapy: Secondary | ICD-10-CM | POA: Insufficient documentation

## 2020-07-09 DIAGNOSIS — I442 Atrioventricular block, complete: Secondary | ICD-10-CM | POA: Diagnosis not present

## 2020-07-09 DIAGNOSIS — R5383 Other fatigue: Secondary | ICD-10-CM | POA: Diagnosis not present

## 2020-07-09 DIAGNOSIS — Z950 Presence of cardiac pacemaker: Secondary | ICD-10-CM | POA: Diagnosis not present

## 2020-07-09 DIAGNOSIS — M47814 Spondylosis without myelopathy or radiculopathy, thoracic region: Secondary | ICD-10-CM | POA: Diagnosis not present

## 2020-07-09 DIAGNOSIS — Z959 Presence of cardiac and vascular implant and graft, unspecified: Secondary | ICD-10-CM

## 2020-07-09 HISTORY — DX: Presence of cardiac pacemaker: Z95.0

## 2020-07-09 HISTORY — PX: PACEMAKER IMPLANT: EP1218

## 2020-07-09 SURGERY — PACEMAKER IMPLANT
Anesthesia: LOCAL

## 2020-07-09 MED ORDER — SODIUM CHLORIDE 0.9 % IV SOLN
INTRAVENOUS | Status: AC
  Filled 2020-07-09: qty 2

## 2020-07-09 MED ORDER — SODIUM CHLORIDE 0.9 % IV SOLN
80.0000 mg | INTRAVENOUS | Status: AC
  Administered 2020-07-09: 80 mg

## 2020-07-09 MED ORDER — CEFAZOLIN SODIUM-DEXTROSE 2-4 GM/100ML-% IV SOLN
2.0000 g | INTRAVENOUS | Status: AC
  Administered 2020-07-09: 2 g via INTRAVENOUS

## 2020-07-09 MED ORDER — ACETAMINOPHEN 325 MG PO TABS: 325.0000 mg | ORAL_TABLET | ORAL | Status: DC | PRN

## 2020-07-09 MED ORDER — MIDAZOLAM HCL 5 MG/5ML IJ SOLN
INTRAMUSCULAR | Status: DC | PRN
  Administered 2020-07-09: 2 mg via INTRAVENOUS

## 2020-07-09 MED ORDER — CHLORHEXIDINE GLUCONATE 4 % EX LIQD: 4.0000 "application " | Freq: Once | CUTANEOUS | Status: DC

## 2020-07-09 MED ORDER — HEPARIN (PORCINE) IN NACL 1000-0.9 UT/500ML-% IV SOLN
INTRAVENOUS | Status: DC | PRN
  Administered 2020-07-09: 500 mL

## 2020-07-09 MED ORDER — HEPARIN (PORCINE) IN NACL 1000-0.9 UT/500ML-% IV SOLN
INTRAVENOUS | Status: AC
  Filled 2020-07-09: qty 500

## 2020-07-09 MED ORDER — LIDOCAINE HCL (PF) 1 % IJ SOLN
INTRAMUSCULAR | Status: AC
  Filled 2020-07-09: qty 60

## 2020-07-09 MED ORDER — FENTANYL CITRATE (PF) 100 MCG/2ML IJ SOLN
INTRAMUSCULAR | Status: AC
  Filled 2020-07-09: qty 2

## 2020-07-09 MED ORDER — ONDANSETRON HCL 4 MG/2ML IJ SOLN: 4.0000 mg | Freq: Four times a day (QID) | INTRAMUSCULAR | Status: DC | PRN

## 2020-07-09 MED ORDER — MIDAZOLAM HCL 5 MG/5ML IJ SOLN
INTRAMUSCULAR | Status: AC
  Filled 2020-07-09: qty 5

## 2020-07-09 MED ORDER — SODIUM CHLORIDE 0.9 % IV SOLN: INTRAVENOUS | Status: DC

## 2020-07-09 MED ORDER — LIDOCAINE HCL (PF) 1 % IJ SOLN
INTRAMUSCULAR | Status: DC | PRN
  Administered 2020-07-09: 60 mL

## 2020-07-09 MED ORDER — CEFAZOLIN SODIUM-DEXTROSE 2-4 GM/100ML-% IV SOLN
INTRAVENOUS | Status: AC
  Filled 2020-07-09: qty 100

## 2020-07-09 MED ORDER — FENTANYL CITRATE (PF) 100 MCG/2ML IJ SOLN
INTRAMUSCULAR | Status: DC | PRN
  Administered 2020-07-09: 25 ug via INTRAVENOUS

## 2020-07-09 SURGICAL SUPPLY — 13 items
CABLE SURGICAL S-101-97-12 (CABLE) ×2 IMPLANT
CATH RIGHTSITE C315HIS02 (CATHETERS) ×1 IMPLANT
HEMOSTAT SURGICEL 2X4 FIBR (HEMOSTASIS) ×1 IMPLANT
LEAD SELECT SECURE 3830 383069 (Lead) IMPLANT
LEAD TENDRIL MRI 52CM LPA1200M (Lead) ×1 IMPLANT
PACEMAKER ASSURITY DR-RF (Pacemaker) ×1 IMPLANT
PAD PRO RADIOLUCENT 2001M-C (PAD) ×2 IMPLANT
SELECT SECURE 3830 383069 (Lead) ×2 IMPLANT
SHEATH 7FR PRELUDE SNAP 13 (SHEATH) ×1 IMPLANT
SHEATH 8FR PRELUDE SNAP 13 (SHEATH) ×1 IMPLANT
SLITTER 6232ADJ (MISCELLANEOUS) ×1 IMPLANT
TRAY PACEMAKER INSERTION (PACKS) ×2 IMPLANT
WIRE HI TORQ VERSACORE-J 145CM (WIRE) ×1 IMPLANT

## 2020-07-09 NOTE — Discharge Instructions (Signed)
Pacemaker Implantation, Adult, Care After This sheet gives you information about how to care for yourself after your procedure. Your health care provider may also give you more specific instructions. If you have problems or questions, contact your health care provider. What can I expect after the procedure? After the procedure, it is common to have:  Mild pain.  Slight bruising.  Some swelling over the incisions.  A slight bump over the skin where the device was placed (if it was implanted in the upper chest area). Sometimes, it is possible to feel the device under the skin. This is normal. Follow these instructions at home: Medicines  Take over-the-counter and prescription medicines only as told by your health care provider.  If you were prescribed an antibiotic medicine, take it as told by your health care provider. Do not stop taking the antibiotic even if you start to feel better.  Ask your health care provider if the medicine prescribed to you requires you to avoid driving or using machinery. Incision site care  Do not remove the bandage (dressing) on your chest until you are told to do so by your health care provider.  After your dressing is removed, you may see pieces of tape called skin adhesive strips over the incision site. Let the strips fall off on their own.  Check your incision area every day for signs of infection. Check for: ? More redness, swelling, or pain. ? Fluid or blood. ? Warmth. ? Pus or a bad smell.  Do not use lotions or ointments near the incision site unless you are told to do so.  Keep the incision area clean and dry for 2-3 days after the procedure or as told by your health care provider. It takes several weeks for the incision site to completely heal.  Women may want to place a small pad over the incision site to protect it from their bra strap.  Do not take baths, swim, or use a hot tub for 7-10 days or until your health care provider approves.  Ask your health care provider if you may take showers. You may only be allowed to take sponge baths.   Activity  If you were given a medicine to help you relax (sedative) during the procedure, it can affect you for several hours. Do not drive or operate machinery until your health care provider says that it is safe.  Return to your normal activities as told by your health care provider. Ask your health care provider what activities are safe for you.  Do not lift anything that is heavier than 10 lb (4.5 kg), or the limit that you are told, until your health care provider says that it is safe.  Avoid sudden jerking, pulling, or chopping movements that pull your upper arm far away from your body. Avoid these movements for at least 6 weeks or as long as told by your health care provider.  Do not lift your upper arm above your shoulders for at least 6 weeks or as long as told by your health care provider. This includes activities like tennis, golf, or swimming.  You may go back to work when your health care provider says it is okay.   Electricity and magnetic fields  Avoid places or objects that have a strong electric or magnetic field, including: ? Airport Data processing manager. When at the airport, let officials know that you have a pacemaker. Carry your pacemaker ID card. ? Metal detectors. If you must pass through a metal  detector, walk through it quickly. Do not stop under the detector or stand near it. ? Power plants. ? Large electrical generators. ? Radiofrequency transmission towers, such as mobile phone and radio towers.  Do not use amateur Chief of Staff. If you are unsure of whether something is safe to use, ask your health care provider. Some devices may be safe to use if you hold them at least 1 ft (0.3 m) from your pacemaker. These devices may include power tools, lawn mowers, and speakers.  When using your mobile phone, hold it to the ear opposite the  pacemaker. Do not leave your mobile phone in a pocket over the pacemaker. Long-term care  You may be shown how to transfer data from your pacemaker through the phone to your health care provider.  Always let all health care providers, including dentists, know about your pacemaker before you have any medical procedures or tests.  Wear a medical ID bracelet or necklace stating that you have a pacemaker.  Carry a pacemaker ID card with you at all times.  Your pacemaker battery will last for 5-15 years. Your health care provider will do routine checks to know when the battery is starting to run down. When this happens, the pacemaker will need to be replaced. General instructions  Do not use any products that contain nicotine or tobacco, such as cigarettes, e-cigarettes, and chewing tobacco. These can delay incision healing after surgery. If you need help quitting, ask your health care provider.  Follow instructions from your health care provider about eating or drinking restrictions.  Weigh yourself every day. If you suddenly gain weight, fluid may be building up in your body.  Keep all follow-up visits as told by your health care provider. This is important. During follow-up visits, your pacemaker will be checked and reprogrammed if necessary. Contact a health care provider if:  You gain weight suddenly.  Your legs or feet swell.  It feels like your heart is fluttering or skipping beats (you have heart palpitations).  You have any of these signs of infection: ? More redness, swelling, or pain around an incision. ? Fluid or blood coming from an incision. ? Warmth coming from an incision. ? Pus or a bad smell coming from an incision. ? A fever or chills. Get help right away if:  You have chest pain.  You have trouble breathing or are short of breath.  You become extremely tired.  You are light-headed or you faint. These symptoms may represent a serious problem that is an  emergency. Do not wait to see if the symptoms will go away. Get medical help right away. Call your local emergency services (911 in the [place] Do not drive yourself to the hospital. Summary  After the procedure, it is common to have mild pain, swelling, or bruising over the incision.  Take over-the-counter and prescription medicines only as told by your health care provider.  Do not raise your arm above your shoulder or lift anything that is heavier than 10 lb (4.5 kg), or the limit that you are told, until your health care provider says that it is safe.  Carry a pacemaker ID card with you at all times. This information is not intended to replace advice given to you by your health care provider. Make sure you discuss any questions you have with your health care provider. Document Revised: 02/26/2019 Document Reviewed: 02/26/2019 Elsevier Patient Education  2021 [employer].  Supplemental Discharge Instructions for  Pacemaker/Defibrillator Patients  Tomorrow, 07/10/20, send in a device transmission  Activity No heavy lifting or vigorous activity with your left arm for 6 to 8 weeks.  Do not raise your left arm above your head for one week.  Gradually raise your affected arm as drawn below.              07/14/20                      07/15/20                      07/16/20                     07/17/20 __  NO DRIVING until cleared to at your wound check visit  [place] the wound area clean and dry.  Do not get this area wet , no showers for 24 hours; you may shower on  07/11/20  . - Tomorrow, 07/10/20, remove the arm sling - Dr. Caryl Comes used DERMABOND to close your wound.  DO NOT peel this off.  Do not rub/scrub the area, pat dry. - No bandage is needed on the site.  DO  NOT apply any creams, oils, or ointments to the wound area. - If you notice any drainage or discharge from the wound, any swelling or bruising at the site, or you develop a fever > 101? F after you are discharged  home, call the office at once.  Special Instructions - You are still able to use cellular telephones; use the ear opposite the side where you have your pacemaker/defibrillator.  Avoid carrying your cellular phone near your device. - When traveling through airports, show security personnel your identification card to avoid being screened in the metal detectors.  Ask the security personnel to use the hand wand. - Avoid arc welding equipment, MRI testing (magnetic resonance imaging), TENS units (transcutaneous nerve stimulators).  Call the office for questions about other devices. - Avoid electrical appliances that are in poor condition or are not properly grounded. - Microwave ovens are safe to be near or to operate.

## 2020-07-09 NOTE — Interval H&P Note (Signed)
History and Physical Interval Note:  07/09/2020 2:59 PM  Grant Bowers  has presented today for surgery, with the diagnosis of sinus brady.  The various methods of treatment have been discussed with the patient and family. After consideration of risks, benefits and other options for treatment, the patient has consented to  Procedure(s): PACEMAKER IMPLANT (N/A) as a surgical intervention.  The patient's history has been reviewed, patient examined, no change in status, stable for surgery.  I have reviewed the patient's chart and labs.  Questions were answered to the patient's satisfaction.     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

## 2020-07-09 NOTE — Interval H&P Note (Signed)
History and Physical Interval Note:  07/09/2020 2:47 PM  Grant Bowers  has presented today for surgery, with the diagnosis of sinus brady.  The various methods of treatment have been discussed with the patient and family. After consideration of risks, benefits and other options for treatment, the patient has consented to  Procedure(s): PACEMAKER IMPLANT (N/A) as a surgical intervention.  The patient's history has been reviewed, patient examined, no change in status, stable for surgery.  I have reviewed the patient's chart and labs.  Questions were answered to the patient's satisfaction.     Virl Axe

## 2020-07-12 ENCOUNTER — Encounter (HOSPITAL_COMMUNITY): Payer: Self-pay | Admitting: Internal Medicine

## 2020-07-13 ENCOUNTER — Telehealth: Payer: Self-pay

## 2020-07-13 NOTE — Telephone Encounter (Signed)
Spoke with patient about his transmission received on 07/13/2020. Patient was outside yesterday doing activities. When patient came in to check his blood pressure, his machine showed that his heart rate was irregular. Patient's transmission shows 2 AMS entries from 07/12/20 26-30 seconds duration. Parameters reviewed with patient  Normal device function. Base rate 60 bpm Max track rate 120 bpm  Advised patient that there were some irregular beats but no obvious issues at this time. I encouraged patient record his pulse during activity. He was advised that if he became symptomatic to contact the [facility] at [phone] and to send a transmission, that we could review and check for any changes. Patient not having any symptoms today.Patient verbalized understanding. I also advised patient that if the device clinic receives any alerts, that someone would contact him for assessment.

## 2020-07-13 NOTE — Telephone Encounter (Signed)
Patient called in and wants to know if there are anything abnormal seen on his transmission. He stated his BP machine is showing he is having irregular heart rates. Patient will send a remote transmission and would like a nurse to call back to reassure him

## 2020-07-22 ENCOUNTER — Ambulatory Visit (INDEPENDENT_AMBULATORY_CARE_PROVIDER_SITE_OTHER): Payer: Medicare PPO | Admitting: Emergency Medicine

## 2020-07-22 ENCOUNTER — Other Ambulatory Visit: Payer: Self-pay

## 2020-07-22 DIAGNOSIS — I442 Atrioventricular block, complete: Secondary | ICD-10-CM

## 2020-07-26 ENCOUNTER — Other Ambulatory Visit: Payer: Self-pay | Admitting: Internal Medicine

## 2020-07-26 NOTE — Telephone Encounter (Signed)
Please refill as per office routine med refill policy (all routine meds refilled for 3 mo or monthly per pt preference up to one year from last visit, then month to month grace period for 3 mo, then further med refills will have to be denied)  

## 2020-07-27 DIAGNOSIS — M9901 Segmental and somatic dysfunction of cervical region: Secondary | ICD-10-CM | POA: Diagnosis not present

## 2020-07-27 DIAGNOSIS — M9903 Segmental and somatic dysfunction of lumbar region: Secondary | ICD-10-CM | POA: Diagnosis not present

## 2020-07-27 DIAGNOSIS — M546 Pain in thoracic spine: Secondary | ICD-10-CM | POA: Diagnosis not present

## 2020-07-27 DIAGNOSIS — S161XXA Strain of muscle, fascia and tendon at neck level, initial encounter: Secondary | ICD-10-CM | POA: Diagnosis not present

## 2020-07-27 DIAGNOSIS — M9902 Segmental and somatic dysfunction of thoracic region: Secondary | ICD-10-CM | POA: Diagnosis not present

## 2020-07-27 DIAGNOSIS — S39012A Strain of muscle, fascia and tendon of lower back, initial encounter: Secondary | ICD-10-CM | POA: Diagnosis not present

## 2020-07-28 DIAGNOSIS — M9901 Segmental and somatic dysfunction of cervical region: Secondary | ICD-10-CM | POA: Diagnosis not present

## 2020-07-28 DIAGNOSIS — S161XXA Strain of muscle, fascia and tendon at neck level, initial encounter: Secondary | ICD-10-CM | POA: Diagnosis not present

## 2020-07-28 DIAGNOSIS — M9903 Segmental and somatic dysfunction of lumbar region: Secondary | ICD-10-CM | POA: Diagnosis not present

## 2020-07-28 DIAGNOSIS — M9902 Segmental and somatic dysfunction of thoracic region: Secondary | ICD-10-CM | POA: Diagnosis not present

## 2020-07-28 DIAGNOSIS — M546 Pain in thoracic spine: Secondary | ICD-10-CM | POA: Diagnosis not present

## 2020-07-28 DIAGNOSIS — S39012A Strain of muscle, fascia and tendon of lower back, initial encounter: Secondary | ICD-10-CM | POA: Diagnosis not present

## 2020-07-29 ENCOUNTER — Ambulatory Visit (INDEPENDENT_AMBULATORY_CARE_PROVIDER_SITE_OTHER): Payer: Medicare PPO

## 2020-07-29 ENCOUNTER — Telehealth: Payer: Self-pay | Admitting: Cardiology

## 2020-07-29 ENCOUNTER — Other Ambulatory Visit: Payer: Self-pay

## 2020-07-29 DIAGNOSIS — I447 Left bundle-branch block, unspecified: Secondary | ICD-10-CM | POA: Diagnosis not present

## 2020-07-29 DIAGNOSIS — R001 Bradycardia, unspecified: Secondary | ICD-10-CM | POA: Diagnosis not present

## 2020-07-29 LAB — ECHOCARDIOGRAM COMPLETE
AR max vel: 2.83 cm2
AV Area VTI: 3.26 cm2
AV Area mean vel: 2.86 cm2
AV Mean grad: 2.9 mmHg
AV Peak grad: 6.2 mmHg
Ao pk vel: 1.25 m/s
Area-P 1/2: 3.93 cm2
Calc EF: 67.9 %
MV M vel: 4.35 m/s
MV Peak grad: 75.5 mmHg
Radius: 0.35 cm
S' Lateral: 3.2 cm
Single Plane A2C EF: 69.3 %
Single Plane A4C EF: 64.7 %

## 2020-07-29 NOTE — Telephone Encounter (Signed)
Error

## 2020-07-29 NOTE — Telephone Encounter (Addendum)
Called and advised the patient that the results are still in process. Once the results have been reviewed by Dr. Rayann Heman. I will call him with an update. Patient verbalized understanding.

## 2020-07-29 NOTE — Progress Notes (Signed)
Wound check appointment. Derma - bond removed by patient prior to appointment. Wound without redness or edema. Incision edges approximated, wound well healed. Normal device function. Thresholds, sensing, and impedances consistent with implant measurements. Device programmed at 3.5V/auto capture programmed on for extra safety margin until 3 month visit. Histogram distribution appropriate for patient and level of activity. 4 AMS episodes with EGMs that show AT, longest episode 8 seconds.No high ventricular rates noted. Patient educated about wound care, arm mobility, lifting restrictions. ROV with Dr Caryl Comes 10/12/20. Enrolled in remote follow-up and next remote 10/12/20.

## 2020-07-29 NOTE — Telephone Encounter (Signed)
Patient called stating that he is reading his Echo results from this morning. He is wanting to speak with someone regarding the test results.

## 2020-08-07 ENCOUNTER — Other Ambulatory Visit: Payer: Self-pay | Admitting: Internal Medicine

## 2020-08-07 NOTE — Telephone Encounter (Signed)
Ok for routine refill of all except the labetolol  Please refill as per office routine med refill policy (all routine meds refilled for 3 mo or monthly per pt preference up to one year from last visit, then month to month grace period for 3 mo, then further med refills will have to be denied)

## 2020-08-11 DIAGNOSIS — D2272 Melanocytic nevi of left lower limb, including hip: Secondary | ICD-10-CM | POA: Diagnosis not present

## 2020-08-11 DIAGNOSIS — Z08 Encounter for follow-up examination after completed treatment for malignant neoplasm: Secondary | ICD-10-CM | POA: Diagnosis not present

## 2020-08-11 DIAGNOSIS — D225 Melanocytic nevi of trunk: Secondary | ICD-10-CM | POA: Diagnosis not present

## 2020-08-11 DIAGNOSIS — Z1283 Encounter for screening for malignant neoplasm of skin: Secondary | ICD-10-CM | POA: Diagnosis not present

## 2020-08-11 DIAGNOSIS — D2261 Melanocytic nevi of right upper limb, including shoulder: Secondary | ICD-10-CM | POA: Diagnosis not present

## 2020-08-11 DIAGNOSIS — L821 Other seborrheic keratosis: Secondary | ICD-10-CM | POA: Diagnosis not present

## 2020-08-11 DIAGNOSIS — D224 Melanocytic nevi of scalp and neck: Secondary | ICD-10-CM | POA: Diagnosis not present

## 2020-08-11 DIAGNOSIS — L814 Other melanin hyperpigmentation: Secondary | ICD-10-CM | POA: Diagnosis not present

## 2020-08-11 DIAGNOSIS — D2271 Melanocytic nevi of right lower limb, including hip: Secondary | ICD-10-CM | POA: Diagnosis not present

## 2020-09-15 ENCOUNTER — Other Ambulatory Visit: Payer: Self-pay

## 2020-09-15 MED ORDER — TAMSULOSIN HCL 0.4 MG PO CAPS: 0.4000 mg | ORAL_CAPSULE | Freq: Every day | ORAL | 3 refills | Status: DC

## 2020-10-02 ENCOUNTER — Other Ambulatory Visit: Payer: Self-pay | Admitting: Internal Medicine

## 2020-10-02 NOTE — Telephone Encounter (Signed)
Please refill as per office routine med refill policy (all routine meds refilled for 3 mo or monthly per pt preference up to one year from last visit, then month to month grace period for 3 mo, then further med refills will have to be denied)  

## 2020-10-05 ENCOUNTER — Telehealth: Payer: Self-pay | Admitting: Internal Medicine

## 2020-10-05 NOTE — Telephone Encounter (Signed)
   Patient said that he is no longer taking  labetalol (NORMODYNE) 300 MG tablet. He is requesting that it be removed from his med list. Please advise

## 2020-10-05 NOTE — Telephone Encounter (Signed)
Medication has been removed.

## 2020-10-12 ENCOUNTER — Ambulatory Visit: Payer: Medicare PPO | Admitting: Internal Medicine

## 2020-10-12 ENCOUNTER — Ambulatory Visit (INDEPENDENT_AMBULATORY_CARE_PROVIDER_SITE_OTHER): Payer: Medicare PPO

## 2020-10-12 ENCOUNTER — Other Ambulatory Visit: Payer: Self-pay

## 2020-10-12 ENCOUNTER — Encounter: Payer: Self-pay | Admitting: Internal Medicine

## 2020-10-12 VITALS — BP 154/80 | HR 90 | Ht 72.0 in | Wt 216.0 lb

## 2020-10-12 DIAGNOSIS — R001 Bradycardia, unspecified: Secondary | ICD-10-CM

## 2020-10-12 DIAGNOSIS — Z950 Presence of cardiac pacemaker: Secondary | ICD-10-CM

## 2020-10-12 DIAGNOSIS — R0600 Dyspnea, unspecified: Secondary | ICD-10-CM | POA: Diagnosis not present

## 2020-10-12 DIAGNOSIS — I441 Atrioventricular block, second degree: Secondary | ICD-10-CM

## 2020-10-12 DIAGNOSIS — R0609 Other forms of dyspnea: Secondary | ICD-10-CM

## 2020-10-12 DIAGNOSIS — Z8616 Personal history of COVID-19: Secondary | ICD-10-CM | POA: Diagnosis not present

## 2020-10-12 LAB — CUP PACEART REMOTE DEVICE CHECK
Battery Remaining Longevity: 71 mo
Battery Remaining Percentage: 95.5 %
Battery Voltage: 2.98 V
Brady Statistic AP VP Percent: 30 %
Brady Statistic AP VS Percent: 1.3 %
Brady Statistic AS VP Percent: 67 %
Brady Statistic AS VS Percent: 1.1 %
Brady Statistic RA Percent Paced: 29 %
Brady Statistic RV Percent Paced: 96 %
Date Time Interrogation Session: 20220705020014
Implantable Lead Implant Date: 20220401
Implantable Lead Implant Date: 20220401
Implantable Lead Location: 753859
Implantable Lead Location: 753860
Implantable Lead Model: 3830
Implantable Pulse Generator Implant Date: 20220401
Lead Channel Impedance Value: 510 Ohm
Lead Channel Impedance Value: 540 Ohm
Lead Channel Pacing Threshold Amplitude: 0.5 V
Lead Channel Pacing Threshold Amplitude: 0.75 V
Lead Channel Pacing Threshold Pulse Width: 0.4 ms
Lead Channel Pacing Threshold Pulse Width: 0.4 ms
Lead Channel Sensing Intrinsic Amplitude: 12 mV
Lead Channel Sensing Intrinsic Amplitude: 2 mV
Lead Channel Setting Pacing Amplitude: 3.5 V
Lead Channel Setting Pacing Amplitude: 3.5 V
Lead Channel Setting Pacing Pulse Width: 0.4 ms
Lead Channel Setting Sensing Sensitivity: 2 mV
Pulse Gen Model: 2272
Pulse Gen Serial Number: 3912376

## 2020-10-12 MED ORDER — HYDROCHLOROTHIAZIDE 25 MG PO TABS: 25.0000 mg | ORAL_TABLET | Freq: Every day | ORAL | 3 refills | Status: DC

## 2020-10-12 NOTE — Progress Notes (Signed)
Patient ID: Grant Bowers, male   DOB: [birth year], 75 y.o.   MRN: [id number]      Patient Care Team: Biagio Borg, MD as PCP - General   HPI  Grant Bowers is a 75 y.o. 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 There have been no, lightheadedness or syncope. Complains of shortness of breath on exertion post Covid and irregular heartbeats detected by his blood pressure machine but without  palpitations  Since placement of device he have been feeling better and his dyspnea has gotten better gradually      Bp log at home ranges approximately 140-145/60-60  He plays golf and his ability to walk is increased from about 10 holes to 13 holes    Date   Cr            K          Hgb  07/21 1.23 4.7          14.4  03/22  1.28 4.2           14.6   DATE TEST EF%   4/22 Echo  65-70 %     Past Medical History:  Diagnosis Date   Carotid artery stenosis 09/23/2015   GERD 04/08/2008   Heart murmur    as a child only    Hyperlipidemia    om meds and borderline numbers with labs    Hypertension    HYPOGONADISM, MALE 04/08/2008   PSA, INCREASED 04/08/2008   RENAL CALCULUS 02/11/2010   Sleep apnea    uses CPAP    Past Surgical History:  Procedure Laterality Date   CARDIAC CATHETERIZATION  2017   Cath at [facility] showed no CAD (per patient)   collasped lung     COLONOSCOPY  12/07/2006   [facility] [place] LITHOTRIPSY Right 04/29/2020   Procedure: EXTRACORPOREAL SHOCK WAVE LITHOTRIPSY (ESWL);  Surgeon: Robley Fries, MD;  Location: [facility];  Service: Urology;  Laterality: Right;   KIDNEY STONE SURGERY     PACEMAKER IMPLANT N/A 07/09/2020   Procedure: PACEMAKER IMPLANT;  Surgeon: Deboraha Sprang, MD;  Location: [place] [facility];  Service:  Cardiovascular;  Laterality: N/A;    No outpatient medications have been marked as taking for the 10/12/20 encounter (Appointment) with Deboraha Sprang, MD.    No Known Allergies  Physical Exam: BP (!) 154/80   Pulse 90   Ht 6' (1.829 m)   Wt 216 lb (98 kg)   SpO2 96%   BMI 29.29 kg/m  Well developed and Morbidly obese in no acute distress HENT normal Neck supple with JVP-flat Lungs Clear Device pocket well healed; without hematoma or erythema.  There is no tethering Regular rate and rhythm, No gallop and  No murmur Abd-soft with active BS No Clubbing cyanosis No edema Skin-warm and dry A & Oriented  Grossly normal sensory and motor function  ECG: Sinus rhythm with P synchronous pacing Intervals 18/15/42 Standing to QRS peak V6 at 80 ms   Assessment and  Plan: Trifascicular block  Pacemaker-Saint Jude  Dyspnea on exertion  Hypertension  Post COVID  The patient is significantly improved following pacing.  Improved energy.  Dyspnea gradually improving.  On examination no evidence of volume overload.  We will get pulmonary function  test post COVID.  He is to see his PCP next month.  We will defer the evaluation of those test results to Dr. Jenny Reichmann  Blood pressure is elevated.  We will continue Avapro 300, amlodipine 10 and we will increase his hydrochlorothiazide from 12.5--25.    Current medicines are reviewed at length with the patient today .  The patient does not have concerns regarding medicines.   I,Stephanie Williams,acting as a Education administrator for Virl Axe, MD.,have documented all relevant documentation on the behalf of Virl Axe, MD,as directed by  Virl Axe, MD while in the presence of Virl Axe, MD.  I, Virl Axe, MD, have reviewed all documentation for this visit. The documentation on 10/12/20 for the exam, diagnosis, procedures, and orders are all accurate and complete.

## 2020-10-12 NOTE — Patient Instructions (Signed)
Medication Instructions:  Your physician has recommended you make the following change in your medication:   Stop HCTZ 12.5mg   Begin HCTZ 25mg  - 1 tablet by mouth daily.  *If you need a refill on your cardiac medications before your next appointment, please call your pharmacy*   Lab Work: None ordered.  If you have labs (blood work) drawn today and your tests are completely normal, you will receive your results only by: [place] (if you have MyChart) OR A paper copy in the mail If you have any lab test that is abnormal or we need to change your treatment, we will call you to review the results.   Testing/Procedures: Your physician has recommended that you have a pulmonary function test. Pulmonary Function Tests are a group of tests that measure how well air moves in and out of your lungs.    Follow-Up: At [facility], you and your health needs are our priority.  As part of our continuing mission to provide you with exceptional heart care, we have created designated Provider Care Teams.  These Care Teams include your primary Cardiologist (physician) and Advanced Practice Providers (APPs -  Physician Assistants and Nurse Practitioners) who all work together to provide you with the care you need, when you need it.  We recommend signing up for the patient portal called "MyChart".  Sign up information is provided on this After Visit Summary.  MyChart is used to connect with patients for Virtual Visits (Telemedicine).  Patients are able to view lab/test results, encounter notes, upcoming appointments, etc.  Non-urgent messages can be sent to your provider as well.   To learn more about what you can do with MyChart, go to [email or web address].    Your next appointment:   9 month(s)  The format for your next appointment:   In Person  Provider:   Thompson Grayer, MD in the [facility] office

## 2020-10-19 ENCOUNTER — Ambulatory Visit (INDEPENDENT_AMBULATORY_CARE_PROVIDER_SITE_OTHER): Payer: Medicare PPO | Admitting: Internal Medicine

## 2020-10-19 ENCOUNTER — Other Ambulatory Visit: Payer: Self-pay

## 2020-10-19 DIAGNOSIS — R0600 Dyspnea, unspecified: Secondary | ICD-10-CM

## 2020-10-19 DIAGNOSIS — Z8616 Personal history of COVID-19: Secondary | ICD-10-CM

## 2020-10-19 DIAGNOSIS — R0609 Other forms of dyspnea: Secondary | ICD-10-CM

## 2020-10-19 LAB — PULMONARY FUNCTION TEST
DL/VA % pred: 85 %
DL/VA: 3.36 ml/min/mmHg/L
DLCO unc % pred: 93 %
DLCO unc: 25.07 ml/min/mmHg
FEF 25-75 Post: 4.09 L/sec
FEF 25-75 Pre: 3.31 L/sec
FEF2575-%Change-Post: 23 %
FEF2575-%Pred-Post: 169 %
FEF2575-%Pred-Pre: 136 %
FEV1-%Change-Post: 6 %
FEV1-%Pred-Post: 115 %
FEV1-%Pred-Pre: 109 %
FEV1-Post: 3.85 L
FEV1-Pre: 3.63 L
FEV1FVC-%Change-Post: 4 %
FEV1FVC-%Pred-Pre: 106 %
FEV6-%Change-Post: 1 %
FEV6-%Pred-Post: 110 %
FEV6-%Pred-Pre: 108 %
FEV6-Post: 4.74 L
FEV6-Pre: 4.67 L
FEV6FVC-%Change-Post: 0 %
FEV6FVC-%Pred-Post: 106 %
FEV6FVC-%Pred-Pre: 105 %
FVC-%Change-Post: 1 %
FVC-%Pred-Post: 104 %
FVC-%Pred-Pre: 102 %
FVC-Post: 4.77 L
FVC-Pre: 4.68 L
Post FEV1/FVC ratio: 81 %
Post FEV6/FVC ratio: 100 %
Pre FEV1/FVC ratio: 78 %
Pre FEV6/FVC Ratio: 100 %
RV % pred: 116 %
RV: 3.11 L
TLC % pred: 105 %
TLC: 7.86 L

## 2020-10-19 NOTE — Patient Instructions (Signed)
Full PFT performed today. °

## 2020-10-19 NOTE — Progress Notes (Signed)
PFT performed today. 

## 2020-10-21 ENCOUNTER — Telehealth: Payer: Self-pay | Admitting: Internal Medicine

## 2020-10-21 NOTE — Telephone Encounter (Signed)
Call made to patient, confirmed DOB. Patient states his PFT results posted but he does not know what any of it means. I made him aware CY has to review/interpret the results and we would give him a call back. Voiced understanding. If we call him back today 7/14 he can be reached. If tomorrow 10/22/20 he will not be available until after 3pm.   CY please advise of PFT results.

## 2020-10-22 ENCOUNTER — Telehealth: Payer: Self-pay | Admitting: Internal Medicine

## 2020-10-22 NOTE — Telephone Encounter (Signed)
Grant Bowers is calling requesting his Pulmonary function test results. Please advise.

## 2020-10-22 NOTE — Telephone Encounter (Signed)
Called and spoke with patient's wife Collie Siad ([place]). She stated that the patient would not be available until after 3pm today. She verbalized understanding of the results and will contact Dr. Olin Pia office for him.   Nothing further needed at time of call.

## 2020-10-22 NOTE — Telephone Encounter (Signed)
Pulmonary Function Test showed normal lung function. He can discuss specifics with Dr Caryl Comes, who ordered the test, or with his primary physician, Dr Jenny Reichmann.

## 2020-10-22 NOTE — Telephone Encounter (Signed)
Pt calling wanting PFT results will forward to Dr Caryl Comes to review study ./cy

## 2020-10-26 DIAGNOSIS — H2513 Age-related nuclear cataract, bilateral: Secondary | ICD-10-CM | POA: Diagnosis not present

## 2020-10-26 DIAGNOSIS — H40013 Open angle with borderline findings, low risk, bilateral: Secondary | ICD-10-CM | POA: Diagnosis not present

## 2020-10-27 ENCOUNTER — Telehealth: Payer: Self-pay | Admitting: *Deleted

## 2020-10-27 NOTE — Telephone Encounter (Signed)
   [facility] Pre-operative Risk Assessment    Patient Name: Grant Bowers  DOB: [birth year] MRN: [id number]  HEARTCARE STAFF:  - IMPORTANT!!!!!! Under Visit Info/Reason for Call, type in Other and utilize the format Clearance MM/DD/YY or Clearance TBD. Do not use dashes or single digits. - Please review there is not already an duplicate clearance open for this procedure. - If request is for dental extraction, please clarify the # of teeth to be extracted. - If the patient is currently at the dentist's office, call Pre-Op Callback Staff (MA/nurse) to input urgent request.  - If the patient is not currently in the dentist office, please route to the Pre-Op pool.  Request for surgical clearance:  What type of surgery is being performed? SHOCKWAVE LITHOTRIPSY  When is this surgery scheduled? TBD  What type of clearance is required (medical clearance vs. Pharmacy clearance to hold med vs. Both)? MEDICAL AND DEVICE CLEARANCE; CLEARANCE FORM HAS BEEN HANDED OVER TO THE DEVICE CLINIC  Are there any medications that need to be held prior to surgery and how long?  NONE LISTED  Practice name and name of physician performing surgery? [facility]; DR. Harold Barban  What is the office phone number? [phone]   7.   What is the office fax number? [phone]  8.   Anesthesia type (None, local, MAC, general) ? NOT LISTED    Julaine Hua 10/27/2020, 5:33 PM  _________________________________________________________________   (provider comments below)

## 2020-10-28 ENCOUNTER — Encounter (HOSPITAL_COMMUNITY): Payer: Self-pay | Admitting: Anesthesiology

## 2020-10-28 ENCOUNTER — Telehealth: Payer: Self-pay | Admitting: Internal Medicine

## 2020-10-28 ENCOUNTER — Other Ambulatory Visit: Payer: Self-pay

## 2020-10-28 ENCOUNTER — Encounter: Payer: Self-pay | Admitting: Internal Medicine

## 2020-10-28 ENCOUNTER — Encounter (HOSPITAL_BASED_OUTPATIENT_CLINIC_OR_DEPARTMENT_OTHER): Payer: Self-pay | Admitting: Urology

## 2020-10-28 ENCOUNTER — Other Ambulatory Visit: Payer: Self-pay | Admitting: Urology

## 2020-10-28 DIAGNOSIS — N1330 Unspecified hydronephrosis: Secondary | ICD-10-CM | POA: Diagnosis not present

## 2020-10-28 DIAGNOSIS — N201 Calculus of ureter: Secondary | ICD-10-CM | POA: Diagnosis not present

## 2020-10-28 DIAGNOSIS — R102 Pelvic and perineal pain: Secondary | ICD-10-CM | POA: Diagnosis not present

## 2020-10-28 DIAGNOSIS — N200 Calculus of kidney: Secondary | ICD-10-CM | POA: Diagnosis not present

## 2020-10-28 NOTE — Progress Notes (Signed)
[facility]   Patient Information: Name: Grant Bowers  DOB: 1946-03-21  MRN: [id number]    Planned Procedure:  SHOCKWAVE LITHOTRIPSY  Surgeon:  [facility]; DR. Harold Barban Date of Procedure:  TBD    Device Information:   Clinic EP Physician:   Virl Axe, MD Device Type:  Pacemaker Manufacturer and Phone #:  St. Jude/Abbott: [phone] Pacemaker Dependent?:  No Date of Last Device Check:  10/12/2020 (Remote)        Normal Device Function?:  Yes     Electrophysiologist's Recommendations:   Have magnet available. Provide continuous ECG monitoring when magnet is used or reprogramming is to be performed.  Procedure may interfere with device function.  Magnet should be placed over device during procedure.  Per Device Clinic Standing Orders, Simone Curia  10/28/2020 10:46 AM

## 2020-10-28 NOTE — Progress Notes (Addendum)
Spoke w/ via phone for pre-op interview--- Pt Lab needs dos---- Istat              Lab results------ current ekg in epic/ chart COVID test -----patient states asymptomatic no test needed  Arrive at -------  1030 on 10-29-2020 NPO after MN NO Solid Food.  Clear liquids from MN until--- 0930 Med rec completed Medications to take morning of surgery ----- Zyrtec, Crestor, Norvasc, Flomax, Prilosec Diabetic medication ----- n/a Patient instructed to bring photo id and insurance card day of surgery Patient aware to have Driver (ride ) / caregiver  for 24 hours after surgery --wife, Collie Siad Patient Special Instructions ----- n/a  Pre-Op special Istructions ----- cardiac clearance is not complete that was requested for Dr Milford Cage office 10-27-2020 for ESWL, now pt added on for urgent stent placement 10-29-2020. Foster Simpson, OR scheduler for Dr Milford Cage, notified cardiology about urgent stent procedure this afternoon, this is not complete.  Will need to check in epic dos for update from cardiology.   Patient verbalized understanding of instructions that were given at this phone interview. Patient denies shortness of breath, chest pain, fever, cough at this phone interview.    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  Chart reviewed w/ anesthesia via phone, Dr Gifford Shave MDA,  stated ok to proceed.  PCP: Dr Marshall Cork (lov 06-01-2020 epic) Cardiologist : Dr Grace Bushy Cassell Clement 10-12-2020 epic)  with pacer check Also Dr Caryl Comes completed device order 10-12-2020, copy w/ chart this was for ESWL, ok to use per Dr Gifford Shave  Chest x-ray : 07-09-2020 EKG : 10-12-2020 Echo : 07-29-2020 Stress test:  02-15-2010 epic Cardiac Cath : 02-17-2010 epic Activity level: sob w/ stairs and long distance  walking Sleep Study/ CPAP : YES/ YES

## 2020-10-28 NOTE — Telephone Encounter (Signed)
Dr. Caryl Comes- Do you feel the patient is OK to proceed with shockwave lithotripsy s/p PPM 07/2020?      Patient Name: Grant Bowers  DOB: April 21, 1945 MRN: [id number]  Primary Cardiologist: None  Chart reviewed as part of pre-operative protocol coverage for shockwave lithotripsy with date TBD.  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  Patient was last seen 10/12/2020 by EP.  He reported feeling improved since placement of his device.  His dyspnea had improved gradually, and he reported increased energy.  He was able to golf and reported walking distance had increased from 10 holes to 13 holes.  He was euvolemic.  Pulmonary function test post COVID was recommended.  BP control suboptimal with HCTZ increased to 25 mg.  Will route back to call back pool -- Please reach out to the requesting surgeon and recommend pulmonology clearance be obtained as well, if not yet requested.  Will route to Dr. Caryl Comes as above as well and given our preoperative protocol for EP patients.  Arvil Chaco, PA-C 10/28/2020, 12:11 PM

## 2020-10-28 NOTE — Telephone Encounter (Signed)
Requesting office closed. Will have to call the office tomorrow.

## 2020-10-28 NOTE — Telephone Encounter (Signed)
Dr. Annamaria Boots has read your pulmonary function testing as normal.  This is great news.  Perhaps your PCP can continue to work on your shortness of breath with you

## 2020-10-28 NOTE — Telephone Encounter (Signed)
Will route back to the requesting surgeon's office to make them aware to reach out to Pulmonology for clearance.

## 2020-10-28 NOTE — Telephone Encounter (Signed)
Follow Up:   Selita from Dr Elmyra Ricks office called. She said the patient is not going to have Lithotripsy.. Tomorrow he is having an urgent stent placement witn Ureteroscopy next week.

## 2020-10-28 NOTE — Telephone Encounter (Signed)
Please have the office separate those into two separate preoperative requests.

## 2020-10-29 ENCOUNTER — Encounter (HOSPITAL_COMMUNITY)
Admission: RE | Disposition: A | Discharge status: Home / Self Care | Payer: Self-pay | Source: Home / Self Care | Attending: Urology

## 2020-10-29 ENCOUNTER — Ambulatory Visit (HOSPITAL_COMMUNITY): Payer: Medicare PPO | Admitting: Anesthesiology

## 2020-10-29 ENCOUNTER — Ambulatory Visit (HOSPITAL_COMMUNITY)
Admission: RE | Admit: 2020-10-29 | Discharge: 2020-10-29 | Disposition: A | Discharge status: Home / Self Care | Payer: Medicare PPO | Attending: Urology | Admitting: Urology

## 2020-10-29 ENCOUNTER — Other Ambulatory Visit: Payer: Self-pay | Admitting: Urology

## 2020-10-29 ENCOUNTER — Ambulatory Visit (HOSPITAL_COMMUNITY): Payer: Medicare PPO

## 2020-10-29 ENCOUNTER — Encounter (HOSPITAL_BASED_OUTPATIENT_CLINIC_OR_DEPARTMENT_OTHER): Payer: Self-pay | Admitting: Urology

## 2020-10-29 ENCOUNTER — Other Ambulatory Visit: Payer: Self-pay

## 2020-10-29 ENCOUNTER — Inpatient Hospital Stay: Admit: 2020-10-29 | Payer: Medicare PPO | Admitting: Urology

## 2020-10-29 DIAGNOSIS — Z79899 Other long term (current) drug therapy: Secondary | ICD-10-CM | POA: Insufficient documentation

## 2020-10-29 DIAGNOSIS — Z87442 Personal history of urinary calculi: Secondary | ICD-10-CM | POA: Diagnosis not present

## 2020-10-29 DIAGNOSIS — E785 Hyperlipidemia, unspecified: Secondary | ICD-10-CM | POA: Diagnosis not present

## 2020-10-29 DIAGNOSIS — Z888 Allergy status to other drugs, medicaments and biological substances status: Secondary | ICD-10-CM | POA: Insufficient documentation

## 2020-10-29 DIAGNOSIS — N134 Hydroureter: Secondary | ICD-10-CM | POA: Diagnosis not present

## 2020-10-29 DIAGNOSIS — N201 Calculus of ureter: Secondary | ICD-10-CM | POA: Diagnosis not present

## 2020-10-29 DIAGNOSIS — Z7982 Long term (current) use of aspirin: Secondary | ICD-10-CM | POA: Diagnosis not present

## 2020-10-29 DIAGNOSIS — J309 Allergic rhinitis, unspecified: Secondary | ICD-10-CM | POA: Diagnosis not present

## 2020-10-29 DIAGNOSIS — N132 Hydronephrosis with renal and ureteral calculous obstruction: Secondary | ICD-10-CM | POA: Diagnosis not present

## 2020-10-29 DIAGNOSIS — I10 Essential (primary) hypertension: Secondary | ICD-10-CM | POA: Diagnosis not present

## 2020-10-29 HISTORY — DX: Personal history of urinary calculi: Z87.442

## 2020-10-29 HISTORY — DX: Trifascicular block: I45.3

## 2020-10-29 HISTORY — PX: CYSTOSCOPY WITH RETROGRADE PYELOGRAM, URETEROSCOPY AND STENT PLACEMENT: SHX5789

## 2020-10-29 HISTORY — DX: Benign prostatic hyperplasia without lower urinary tract symptoms: N40.0

## 2020-10-29 HISTORY — DX: Personal history of other specified conditions: Z87.898

## 2020-10-29 HISTORY — DX: Male erectile dysfunction, unspecified: N52.9

## 2020-10-29 HISTORY — DX: Post covid-19 condition, unspecified: U09.9

## 2020-10-29 HISTORY — DX: Testicular hypofunction: E29.1

## 2020-10-29 HISTORY — DX: Gastro-esophageal reflux disease without esophagitis: K21.9

## 2020-10-29 HISTORY — DX: Obstructive sleep apnea (adult) (pediatric): G47.33

## 2020-10-29 HISTORY — DX: Other forms of dyspnea: R06.09

## 2020-10-29 LAB — POCT I-STAT, CHEM 8
BUN: 24 mg/dL — ABNORMAL HIGH (ref 8–23)
Calcium, Ion: 1.22 mmol/L (ref 1.15–1.40)
Chloride: 102 mmol/L (ref 98–111)
Creatinine, Ser: 2.2 mg/dL — ABNORMAL HIGH (ref 0.61–1.24)
Glucose, Bld: 184 mg/dL — ABNORMAL HIGH (ref 70–99)
HCT: 39 % (ref 39.0–52.0)
Hemoglobin: 13.3 g/dL (ref 13.0–17.0)
Potassium: 4 mmol/L (ref 3.5–5.1)
Sodium: 139 mmol/L (ref 135–145)
TCO2: 24 mmol/L (ref 22–32)

## 2020-10-29 SURGERY — CYSTOSCOPY, WITH RETROGRADE PYELOGRAM AND URETERAL STENT INSERTION
Anesthesia: Choice | Laterality: Right

## 2020-10-29 SURGERY — CYSTOURETEROSCOPY, WITH RETROGRADE PYELOGRAM AND STENT INSERTION
Anesthesia: General | Laterality: Right

## 2020-10-29 MED ORDER — ACETAMINOPHEN 10 MG/ML IV SOLN: 1000.0000 mg | Freq: Once | INTRAVENOUS | Status: DC | PRN

## 2020-10-29 MED ORDER — IOHEXOL 300 MG/ML  SOLN
INTRAMUSCULAR | Status: DC | PRN
  Administered 2020-10-29: 10 mL via URETHRAL

## 2020-10-29 MED ORDER — CEPHALEXIN 500 MG PO CAPS: 500.0000 mg | ORAL_CAPSULE | Freq: Two times a day (BID) | ORAL | 0 refills | Status: AC

## 2020-10-29 MED ORDER — PROPOFOL 10 MG/ML IV BOLUS
INTRAVENOUS | Status: AC
  Filled 2020-10-29: qty 20

## 2020-10-29 MED ORDER — ACETAMINOPHEN 160 MG/5ML PO SOLN: 325.0000 mg | ORAL | Status: DC | PRN

## 2020-10-29 MED ORDER — KETOROLAC TROMETHAMINE 30 MG/ML IJ SOLN
INTRAMUSCULAR | Status: AC
  Filled 2020-10-29: qty 1

## 2020-10-29 MED ORDER — FENTANYL CITRATE (PF) 100 MCG/2ML IJ SOLN: 25.0000 ug | INTRAMUSCULAR | Status: DC | PRN

## 2020-10-29 MED ORDER — OXYCODONE HCL 5 MG/5ML PO SOLN
5.0000 mg | Freq: Once | ORAL | Status: DC | PRN
Start: 2020-10-29 — End: 2020-10-30

## 2020-10-29 MED ORDER — PHENYLEPHRINE 40 MCG/ML (10ML) SYRINGE FOR IV PUSH (FOR BLOOD PRESSURE SUPPORT)
PREFILLED_SYRINGE | INTRAVENOUS | Status: AC
  Filled 2020-10-29: qty 10

## 2020-10-29 MED ORDER — PHENYLEPHRINE 40 MCG/ML (10ML) SYRINGE FOR IV PUSH (FOR BLOOD PRESSURE SUPPORT)
PREFILLED_SYRINGE | INTRAVENOUS | Status: DC | PRN
  Administered 2020-10-29: 40 ug via INTRAVENOUS

## 2020-10-29 MED ORDER — FENTANYL CITRATE (PF) 100 MCG/2ML IJ SOLN
INTRAMUSCULAR | Status: DC | PRN
  Administered 2020-10-29 (×2): 50 ug via INTRAVENOUS

## 2020-10-29 MED ORDER — SODIUM CHLORIDE 0.9 % IV SOLN
INTRAVENOUS | Status: AC
  Filled 2020-10-29: qty 2

## 2020-10-29 MED ORDER — AMISULPRIDE (ANTIEMETIC) 5 MG/2ML IV SOLN: 10.0000 mg | Freq: Once | INTRAVENOUS | Status: DC | PRN

## 2020-10-29 MED ORDER — PROMETHAZINE HCL 25 MG/ML IJ SOLN: 6.2500 mg | INTRAMUSCULAR | Status: DC | PRN

## 2020-10-29 MED ORDER — KETOROLAC TROMETHAMINE 60 MG/2ML IM SOLN
30.0000 mg | Freq: Once | INTRAMUSCULAR | Status: AC
  Administered 2020-10-29: 30 mg via INTRAMUSCULAR
  Filled 2020-10-29: qty 2

## 2020-10-29 MED ORDER — ONDANSETRON HCL 4 MG/2ML IJ SOLN
INTRAMUSCULAR | Status: DC | PRN
  Administered 2020-10-29: 4 mg via INTRAVENOUS

## 2020-10-29 MED ORDER — OXYCODONE HCL 5 MG PO TABS: 5.0000 mg | ORAL_TABLET | Freq: Once | ORAL | Status: DC | PRN

## 2020-10-29 MED ORDER — LACTATED RINGERS IV SOLN: INTRAVENOUS | Status: DC

## 2020-10-29 MED ORDER — ACETAMINOPHEN 325 MG PO TABS: 325.0000 mg | ORAL_TABLET | ORAL | Status: DC | PRN

## 2020-10-29 MED ORDER — PROPOFOL 10 MG/ML IV BOLUS
INTRAVENOUS | Status: DC | PRN
  Administered 2020-10-29: 150 mg via INTRAVENOUS

## 2020-10-29 MED ORDER — SODIUM CHLORIDE 0.9 % IR SOLN
Status: DC | PRN
  Administered 2020-10-29: 3000 mL via INTRAVESICAL

## 2020-10-29 MED ORDER — LIDOCAINE 2% (20 MG/ML) 5 ML SYRINGE
INTRAMUSCULAR | Status: DC | PRN
  Administered 2020-10-29: 80 mg via INTRAVENOUS

## 2020-10-29 MED ORDER — FENTANYL CITRATE (PF) 100 MCG/2ML IJ SOLN
INTRAMUSCULAR | Status: AC
  Filled 2020-10-29: qty 2

## 2020-10-29 MED ORDER — SODIUM CHLORIDE 0.9 % IV SOLN
2.0000 g | INTRAVENOUS | Status: AC
  Administered 2020-10-29: 2 g via INTRAVENOUS
  Filled 2020-10-29: qty 2

## 2020-10-29 SURGICAL SUPPLY — 22 items
BAG URO CATCHER STRL LF (MISCELLANEOUS) ×2 IMPLANT
CATH INTERMIT  6FR 70CM (CATHETERS) ×1 IMPLANT
CLOTH BEACON ORANGE TIMEOUT ST (SAFETY) ×2 IMPLANT
EXTRACTOR STONE NITINOL NGAGE (UROLOGICAL SUPPLIES) IMPLANT
GLOVE SURG ENC TEXT LTX SZ8 (GLOVE) ×2 IMPLANT
GOWN STRL REUS W/TWL XL LVL3 (GOWN DISPOSABLE) ×3 IMPLANT
GUIDEWIRE ANG ZIPWIRE 038X150 (WIRE) IMPLANT
GUIDEWIRE STR DUAL SENSOR (WIRE) ×3 IMPLANT
IV NS 1000ML (IV SOLUTION) ×2
IV NS 1000ML BAXH (IV SOLUTION) ×1 IMPLANT
KIT TURNOVER KIT A (KITS) ×2 IMPLANT
LASER FIB FLEXIVA PULSE ID 365 (Laser) IMPLANT
MANIFOLD NEPTUNE II (INSTRUMENTS) ×2 IMPLANT
PACK CYSTO (CUSTOM PROCEDURE TRAY) ×2 IMPLANT
SHEATH URETERAL 12FRX28CM (UROLOGICAL SUPPLIES) IMPLANT
SHEATH URETERAL 12FRX35CM (MISCELLANEOUS) IMPLANT
SHEATH URETERAL 12FRX55CM (UROLOGICAL SUPPLIES) IMPLANT
STENT URET 6FRX28 CONTOUR (STENTS) ×1 IMPLANT
TRACTIP FLEXIVA PULS ID 200XHI (Laser) IMPLANT
TRACTIP FLEXIVA PULSE ID 200 (Laser)
TUBING CONNECTING 10 (TUBING) ×2 IMPLANT
TUBING UROLOGY SET (TUBING) ×1 IMPLANT

## 2020-10-29 SURGICAL SUPPLY — 23 items
BAG DRAIN URO-CYSTO SKYTR STRL (DRAIN) IMPLANT
BAG DRN UROCATH (DRAIN)
BASKET ZERO TIP NITINOL 2.4FR (BASKET) IMPLANT
BSKT STON RTRVL ZERO TP 2.4FR (BASKET)
CLOTH BEACON ORANGE TIMEOUT ST (SAFETY) IMPLANT
COVER DOME SNAP 22 D (MISCELLANEOUS) IMPLANT
ELECT REM PT RETURN 9FT ADLT (ELECTROSURGICAL) IMPLANT
ELECTRODE REM PT RTRN 9FT ADLT (ELECTROSURGICAL) IMPLANT
FIBER LASER FLEXIVA 365 (UROLOGICAL SUPPLIES) IMPLANT
GLOVE SURG ENC MOIS LTX SZ8 (GLOVE) IMPLANT
GOWN STRL REUS W/TWL LRG LVL3 (GOWN DISPOSABLE) IMPLANT
GUIDEWIRE ANG ZIPWIRE 038X150 (WIRE) IMPLANT
GUIDEWIRE STR DUAL SENSOR (WIRE) IMPLANT
IV NS IRRIG 3000ML ARTHROMATIC (IV SOLUTION) IMPLANT
KIT TURNOVER CYSTO (KITS) IMPLANT
MANIFOLD NEPTUNE II (INSTRUMENTS) IMPLANT
NS IRRIG 500ML POUR BTL (IV SOLUTION) IMPLANT
PACK CYSTO (CUSTOM PROCEDURE TRAY) IMPLANT
SHEATH URETERAL 12FRX35CM (MISCELLANEOUS) IMPLANT
TRACTIP FLEXIVA PULS ID 200XHI (Laser) IMPLANT
TRACTIP FLEXIVA PULSE ID 200 (Laser)
TUBE CONNECTING 12X1/4 (SUCTIONS) IMPLANT
TUBING UROLOGY SET (TUBING) IMPLANT

## 2020-10-29 NOTE — Discharge Instructions (Signed)

## 2020-10-29 NOTE — Anesthesia Postprocedure Evaluation (Signed)
Anesthesia Post Note  Patient: Grant Bowers  Procedure(s) Performed: CYSTOSCOPY WITH RETROGRADE PYELOGRAM, URETEROSCOPY AND STENT PLACEMENT (Right)     Patient location during evaluation: PACU Anesthesia Type: General Level of consciousness: awake and alert Pain management: pain level controlled Vital Signs Assessment: post-procedure vital signs reviewed and stable Respiratory status: spontaneous breathing, nonlabored ventilation, respiratory function stable and patient connected to nasal cannula oxygen Cardiovascular status: blood pressure returned to baseline and stable Postop Assessment: no apparent nausea or vomiting Anesthetic complications: no   No notable events documented.  Last Vitals:  Vitals:   10/29/20 1911 10/29/20 1945  BP:    Pulse: 77   Resp: 10   Temp:  36.8 C  SpO2: 98% 100%    Last Pain:  Vitals:   10/29/20 1945  TempSrc:   PainSc: 0-No pain                 Tnia Anglada

## 2020-10-29 NOTE — Interval H&P Note (Signed)
History and Physical Interval Note:  10/29/2020 4:59 PM  Grant Bowers  has presented today for surgery, with the diagnosis of [name].  The various methods of treatment have been discussed with the patient and family. After consideration of risks, benefits and other options for treatment, the patient has consented to  Procedure(s): CYSTOSCOPY WITH RETROGRADE PYELOGRAM, URETEROSCOPY AND STENT PLACEMENT (Right) as a surgical intervention.  The patient's history has been reviewed, patient examined, no change in status, stable for surgery.  I have reviewed the patient's chart and labs.  Questions were answered to the patient's satisfaction.     Lillette Boxer Michaeljames Milnes

## 2020-10-29 NOTE — Anesthesia Preprocedure Evaluation (Addendum)
Anesthesia Evaluation  Patient identified by MRN, date of birth, ID band Patient awake    Reviewed: Allergy & Precautions, NPO status , Patient's Chart, lab work & pertinent test results  Airway Mallampati: II  TM Distance: >3 FB Neck ROM: Full    Dental no notable dental hx. (+) Teeth Intact, Dental Advisory Given, Caps   Pulmonary neg pulmonary ROS, sleep apnea and Continuous Positive Airway Pressure Ventilation ,    Pulmonary exam normal breath sounds clear to auscultation       Cardiovascular hypertension, Pt. on medications negative cardio ROS Normal cardiovascular exam+ dysrhythmias + pacemaker  Rhythm:Regular Rate:Normal  Echo: 1. Left ventricular ejection fraction, by estimation, is 65 to 70%. The  left ventricle has normal function. The left ventricle has no regional  wall motion abnormalities. There is mild left ventricular hypertrophy.  Left ventricular diastolic parameters  are consistent with Grade I diastolic dysfunction (impaired relaxation).  The average left ventricular global longitudinal strain is 24.0 %. The  global longitudinal strain is normal.  2. Right ventricular systolic function is normal. The right ventricular  size is normal.  3. Left atrial size was moderately dilated.  4. The pericardial effusion is circumferential.  5. The mitral valve is normal in structure. Mild mitral valve  regurgitation. No evidence of mitral stenosis.  6. The aortic valve is tricuspid. Aortic valve regurgitation is not  visualized. No aortic stenosis is present.  7. The inferior vena cava is normal in size with greater than 50%  respiratory variability, suggesting right atrial pressure of 3 mmHg.    Neuro/Psych negative neurological ROS  negative psych ROS   GI/Hepatic negative GI ROS, Neg liver ROS, GERD  Medicated,  Endo/Other  negative endocrine ROS  Renal/GU negative Renal ROS  negative genitourinary    Musculoskeletal negative musculoskeletal ROS (+)   Abdominal   Peds negative pediatric ROS (+)  Hematology negative hematology ROS (+)   Anesthesia Other Findings   Reproductive/Obstetrics negative OB ROS                            Anesthesia Physical  Anesthesia Plan  ASA: 3  Anesthesia Plan: General   Post-op Pain Management:    Induction: Intravenous  PONV Risk Score and Plan: 3 and Ondansetron, Dexamethasone and Treatment may vary due to age or medical condition  Airway Management Planned: LMA  Additional Equipment: None  Intra-op Plan:   Post-operative Plan: Extubation in OR  Informed Consent:   Plan Discussed with: CRNA and Anesthesiologist  Anesthesia Plan Comments:         Anesthesia Quick Evaluation

## 2020-10-29 NOTE — H&P (Addendum)
Grant Bowers is a 75 year-old male established patient who is here for follow up for further evaluation of his elevated PSA.   His last PSA was performed 09/12/2018. The last PSA value was 5.42.   He has had a prostate biopsy. Patient does not have a family history of prostate cancer. The patient states he does not take 5 alpha reductase inhibitor medication.   He has not recently had unwanted weight loss. He is not having new bone pain. This condition would be considered of mild to moderate severity with no modifying factors or associated signs or symptoms other than as noted above.   08/22/16: He was found to have an elevated PSA of 5.41 on 08/09/16. He has been on testosterone injections monthly for hypogonadism. He was recently found to have a low serum testosterone and was told to increase his testosterone injections to every 2 weeks.   03/06/17: He reports that he has not had any change in his voiding pattern. He does have erectile dysfunction and is currently using Cialis that he gets from [place].   03/14/18: He continues to do well with no new urologic complaints. Specifically no voiding difficulties. He uses Cialis in this remains effective for his erectile dysfunction.   10/03/18: He returns today for follow-up of an elevated PSA and has been doing well from urologic standpoint. He continues to use Cialis at a 20 mg dose. No new voiding complaints are noted. No bone pain or unexplained weight loss.  -10/30/19-Patient history of BPH and marginal PSA elevation as above. Been doing well. Remains on tamsulosin 0.4 mg daily and voiding well. Most recent PSA now down to 4.27 on 10/06/2019. This is down from 4.49 in December of 2020. Patient has had negative prostate biopsy on 11/16/2014. Patient continues to take Cialis as needed for erectile dysfunction.  -04/19/20-patient with history of elevated PSA, BPH and nephrolithiasis and erectile dysfunction. Patient had recent PSA 4.27 on 10/06/2019. Repeat  PSA on 04/17/2020 is now 3.97. Baseline values run between 4 and 6.   Second issue is that of erectile dysfunction. Patient has used sildenafil with some limited success in the past. He was interested in looking at information regarding vacuum erection device that he had researched online. I gave him web site for [employer] [facility] to look at the devices.   Patient has had history of nephrolithiasis and had recent episode of back pain with associated hematuria after playing off last week. Currently minimally symptomatic.  KUB is reviewed today and shows several calcifications ranging in the 4-5 mm range overlying the lower caliceal area of the right kidney. There is also a probable 7 mm calcification in the region of the right upper ureter/UPJ area. No obvious distal stone seen on the right and no obvious stones on the left. No bony abnormalities noted.  Micro urinalysis today shows 3-10 RBCs.  CT urogram was obtained and by initial review shows: What appears to be probable parapelvic cyst region the right kidney with 7-8 mm calcification at the upper ureter/UPJ area with perhaps mild hydro noted. There is also a cluster of small stones in the right lower caliceal area. Over read is pending  -05/21/20-patient with history of 7 mm right upper ureteral calculus. Underwent ESL on 05/01/2020 by Dr. pace. Passed some blood but no definitive stone fragments. He is currently pain-free. Here for evaluation and management post lithotripsy.  KUB is reviewed today shows persistent calcification in the previously treated area but the stone looks significantly fragmented  and splayed consistent with good fragmentation.   06/04/2020: Returns today for follow-up exam. He denies interval stone material passage. He has had no recurrence of pain/discomfort. Voiding at his baseline with stable, grossly non bothersome symptomatology. KUB today demonstrate the continued presence of a fragmented right ureteral calculi.  Cluster of  stone material remains in a fragmented/hazy pattern along the right proximal ureter but grossly unchanged in location since time of last office visit exam.   Fortunately the patient remains asymptomatic and is voiding at his baseline. We discussed repeat treatment options including shockwave lithotripsy as well as ureteroscopy. He understands risks versus benefits and success chances with both procedures. Ultimately he would prefer shockwave lithotripsy. I will discuss this with his urologist and then have the patient contacted schedule appropriately. He will continue tamsulosin in the interval and also continue to observe for any interval stone material passage. In the event this occurs he will contact the office; with any new or worsening symptomatology including uncontrollable pain/discomfort or worsening lower urinary tract symptoms he will follow up accordingly as well.    -10/28/20-patient with history of right upper ureteral calculus underwent in situ ESL on 05/01/2020 with follow-up showing some persistent calcification in the region of the treated area. This was back on 06/04/2020. The patient was scheduled for stage retreat of the stone however when he was on the lithotripsy treatment truck was noted to have bradycardia in the procedure was canceled. Subsequently seen by Cardiology and he ended up getting a pacemaker. He has not passed a definitive stone. He has over the last several days had some lower abdominal discomfort but no flank pain or hematuria or fever. Also had some constipation issues. Noted some black color to the stool over the last couple of days after taking 800 mg of ibuprofen for the pain. Here now for evaluation for possible persistent stone.  Micro urinalysis today shows 20-40 RBCs  KUB is reviewed today shows: No obvious bony abnormalities. There is a calcification region of L3 transverse process on the right which measures about 8 mm which could be some fragments Ender  congealed in this area versus a solid stone. No obvious distal stones noted or stones overlying the renal contours.  CT urogram was obtained in by initial review shows what appears to be a fragment at the outlet at the UPJ area on the right and I think some parapelvic cysts which been present on prior CT scan. Following the ureter there is what appears to be approximate 8-9 mm calcification or conglomeration of [place] perhaps at the crossing of the iliac vessel on the right. Distal to this point ureter is normal caliber down to the bladder. Over read is pending      ALLERGIES: Atorvastatin Calcium TABS    MEDICATIONS: Cialis 20 mg tablet 1 tablet PO Every Other Day PRN  Amlodipine Besylate 10 mg tablet Oral  Aspirin 81 MG TABS Oral  Avapro 300 MG Oral Tablet Oral  Cialis 5 mg tablet 1 Oral  Fluticasone Propionate 50 mcg/actuation spray, suspension Nasal  Omeprazole 40 MG Oral Capsule Delayed Release Oral  Simvastatin 20 MG Oral Tablet Oral  Testosterone Cypionate 200 mg/ml vial Intramuscular     GU PSH: Cystoscopy - 2018 ESWL, Right - 04/29/2020 Locm 300-'399Mg'$ /Ml Iodine,1Ml - 2018 Prostate Needle Biopsy - 2016 Ureteroscopic stone removal - 2016       PSH Notes: Biopsy Of The Prostate Needle, Cystoscopy With Ureteroscopy With Removal Of Calculus   NON-GU PSH: None  GU PMH: Renal calculus, Right - 06/04/2020, Right, - 04/19/2020, Right, He was noted to have 3 right renal calculi in the lower pole causing no obstruction., - 2018, Kidney stone on right side, - 2014 Ureteral calculus - 06/04/2020, - 05/21/2020 BPH w/o LUTS (Stable) - 04/19/2020, (Stable), He has a markedly enlarged prostate by exam. He does not have any significant voiding symptoms, - 2018, He does have BPH by exam. He does not have any significant obstructive voiding symptoms., - 2018 Elevated PSA - 04/19/2020, (Stable), He was again noted to have an enlarged prostate but has no nodularity or worrisome induration. In  addition his PSA, although elevated at 5.42, maintains a good free to total ratio and actually has been quite stable over time., - 2020 (Improving), Her his prostate remains benign to exam and his most recent PSA was 4.42. I will check his PSA today and plan to see him back in 6 months., - 2019 (Stable), His PSA remains elevated but he has an excellent free to total ratio and it is currently lower that it is been. His prostate is enlarged and most likely accounts for most of his slight PSA elevation. I have recommended continued monitoring of his PSA every 6 months., - 2018 (Stable), We discussed the fact that he does have an elevated PSA and it has increased slightly. While this very likely could be due to the effects of his testosterone replacement I have recommended we monitor this a little bit more closely with a repeat PSA again in 3 months and then again in 6 months., - 2018 (Worsening), I have discussed with the patient the possible need for further evaluation of his elevated PSA. We have discussed the options which would be continued observation with serial DRE and PSAs versus proceeding with further evaluation at this time with TRUS/Bx. We have discussed the possible risk of progression and spread of prostate cancer if present currently as well as the fact that typically prostate cancer tends to be a relatively slow-growing form of cancer that typically would not progress significantly over the relative short period of time between serial examinations. We also have discussed proceeding at this time with a prostate biopsy and I therefore have gone over the procedure with him in detail. He is familiar with a prostate biopsy having undergone one in the past. We have elected to proceed with close monitoring of his PSA since he had no worrisome findings on DRE and his PSA is currently just slightly above where it was in 6/16 when it was 5.1 with a previous negative prostate biopsy., - 2018, Elevated PSA, -  2016 Microscopic hematuria (Stable) - 04/19/2020, (Stable), I have discussed with the patient the fact that the CT scan has revealed no abnormality of the upper tract that could contribute to the presence of microscopic hematuria and cystoscopically no abnormality of the lower tract was identified. In this case current recommendations are that the patient be followed for microscopic hematuria for 3 - 5 years with urinalysis and serum creatinine on a yearly basis. If the urine remains positive for microscopic hematuria and the patient develops new hypertension, proteinuria or blood cell casts on urinalysis evaluation for primary renal disease should be undertaken. If, on the other hand, microscopic hematuria persists in the absence of any findings to suggest primary renal disease, then repeat urologic evaluation should be considered. In addition if the patient has 2 consecutive negative urinalyses yearly over a two-year period then no further urinalyses for the purpose  of evaluation of asymptomatic microscopic hematuria are necessary., - 2018, I have discussed with the patient that the evaluation of asymptomatic microscopic hematuria consists of excluding benign causes including menstruation in women as well as, in both sexes, vigorous exercise, sexual activity, viral illnesses, trauma and infection. If these factors are negative and the urinalysis reveals significant proteinuria, dysmorphic red blood cells or red cell casts and/or an elevated serum creatinine then evaluation by a nephrologist is indicated. If conditions suggestive of primary renal disease are not present and the patient has a low risk of urothelial malignancy consisting of age less than 40 years, no smoking history, no history of chemical exposure, no irritative voiding symptoms, no history of gross hematuria and no history of urologic disorder or disease then upper tract imaging should be undertaken with a CT scan and consideration of cystoscopy  versus urine cytology as well. If, on the other hand the patient is at high risk then a complete evaluation should be undertaken including upper tract imaging with CT scan and cystoscopy and consideration of urine cytology as well., - 2018 Renal and ureteral calculus - 04/19/2020 BPH w/LUTS (Stable), He has known BPH. He has been taking tamsulosin 0.4 mg. - 2020 Hematospermia, I have reassured him of the fact that his hematospermia is almost certainly not due to prostate cancer or other significant pathology especially with clear urine and no pain. - 2020 Peyronies Disease, He has very mild Peyronie's disease. It is not impacting his ability to engage in intercourse. We have elected to continue to monitor this. - 2020 Urinary Urgency, He has developed some urgency and so I have suggested he try increasing his dosage of tamsulosin to 0.8 mg and if this is helpful he is going to contact me for a new prescription. - 2020 Epididymitis - 2019 Orchitis - 2019 Renal cyst, Bilateral, He was found to have a 7 cm lobulated simple cyst in the right kidney with smaller bilateral cysts noted as well. These are of no clinical significance. 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 He required ureteroscopic extraction of a stone in 2012. CT scan in 4/14 revealed a calcification in the upper pole of the right kidney that was felt to either represent a small, nonobstructing renal calculus versus a vascular calcification.  He did undergo ureteroscopy in [place] in the past.  CT scan 5/18 - single 3 mm stone in the lower pole of the right kidney.   Renal cysts: CT scan without  contrast on 08/07/12 - lesions within the right kidney. CT scan with contrast that same day which revealed the lesions within the right kidney to be simple cysts. The study also indicated there was a calcification in the upper pole of the right kidney that was felt to represent either a small, nonobstructing renal calculus versus a vascular calcification.  CT scan 5/18 - bilateral renal cysts   Elevated PSA: He was found to have a PSA of 5.15 in 6/16 which was up from 4.41 the previous year. No family history of prostate cancer. No abnormality on DRE.  TRUS/BX 11/16/14: BPH only  PSAs:  12/12 - 3.04  12/13 - 3.44  5/15 - 4.41  6/16 - 5.15   Erectile dysfunction: In 8/16 he was started on Cialis 5 mg for erectile dysfunction.   Microscopic hematuria: He was found to have microscopic hematuria and 5/18. A CT scan revealed a single right renal calculus but no other abnormality and cystoscopy was negative.   NON-GU PMH: Encounter for general adult medical examination without abnormal findings, Encounter for preventive health examination - 2016 Personal history of other diseases of the circulatory system, History of hypertension - 2014 Personal history of other diseases of the digestive system, History of esophageal reflux - 2014    FAMILY HISTORY: Cancer - Runs In Family Carcinoma Of The Pancreas - Runs In Family Death In The Family Father - Runs In Family Death In The Family Mother - Runs In Family No pertinent family history - Other   SOCIAL HISTORY: Marital Status: Married Preferred Language: English; Ethnicity: Not Hispanic Or Latino; Race: White Current Smoking Status: Patient has never smoked.   Tobacco Use Assessment Completed: Used Tobacco in last 30 days?     Notes: Alcohol Use, Marital History - Currently Married, Caffeine Use, Never A Smoker   REVIEW OF SYSTEMS:    GU Review Male:   Patient denies frequent urination, hard to postpone urination, burning/ pain with urination, get  up at night to urinate, leakage of urine, stream starts and stops, trouble starting your stream, have to strain to urinate , erection problems, and penile pain.  Gastrointestinal (Upper):   Patient denies nausea, vomiting, and indigestion/ heartburn.  Gastrointestinal (Lower):   Patient denies diarrhea and constipation.  Constitutional:   Patient denies fever, night sweats, weight loss, and fatigue.  Skin:   Patient denies skin rash/ lesion and itching.  Eyes:   Patient denies blurred vision and double vision.  Ears/ Nose/ Throat:   Patient denies sore throat and sinus problems.  Hematologic/Lymphatic:   Patient denies swollen glands and easy bruising.  Cardiovascular:   Patient denies leg swelling and chest pains.  Respiratory:   Patient denies cough and shortness of breath.  Endocrine:   Patient denies excessive thirst.  Musculoskeletal:   Patient denies back pain and joint pain.  Neurological:   Patient denies headaches and dizziness.  Psychologic:   Patient denies depression and anxiety.   Notes: Patient having lower stomach discomfort. Patient had to have a pacemaker and Dr Caryl Comes stated he can move forward getting kidney stone removed.    VITAL SIGNS: None   MULTI-SYSTEM PHYSICAL EXAMINATION:    Constitutional: Well-nourished. No physical deformities. Normally developed. Good grooming.  Neck: Neck symmetrical, not swollen. Normal tracheal position.  Respiratory: No labored breathing, no use of accessory muscles.   Cardiovascular: Normal temperature, normal extremity pulses, no swelling, no varicosities.  Lymphatic: No enlargement of neck, axillae, groin.  Skin: No paleness, no jaundice, no cyanosis. No lesion, no ulcer, no rash.  Neurologic / Psychiatric: Oriented to time, oriented to place, oriented to person. No depression, no anxiety, no agitation.  Gastrointestinal: It patient's abdomen is soft he has no rebound tenderness is slightly tender in the suprapubic area but no masses  noted. He has no CVA tenderness.  Eyes: Normal conjunctivae. Normal eyelids.  Ears, Nose, Mouth, and Throat: Left ear no scars, no lesions, no masses. Right ear no scars, no lesions, no masses. Nose no scars, no lesions, no masses. Normal hearing. Normal lips.  Musculoskeletal: Normal gait and station of head and neck.     Complexity of Data:   04/13/20 10/06/19 03/28/19 09/12/18 03/14/18  01/07/18 09/06/17 02/27/17  PSA  Total PSA 3.97 ng/mL 4.27 ng/mL 4.49 ng/mL 5.42 ng/mL 5.43 ng/mL 4.42 ng/mL 5.64 ng/mL 4.80 ng/mL  Free PSA  1.17 ng/mL 1.04 ng/mL 1.89 ng/mL  1.35 ng/mL 1.52 ng/mL 1.39 ng/mL  % Free PSA  27 % PSA 23 % PSA 35 % PSA  31 % PSA 27 % PSA 29 % PSA    PROCEDURES:         C.T. Urogram - [id number]      Patient confirmed No Neulasta OnPro Device.          KUB - [id number]  A single view of the abdomen is obtained.      Patient confirmed No Neulasta OnPro Device. KUB is reviewed today shows: No obvious bony abnormalities. There is a calcification region of L3 transverse process on the right which measures about 8 mm which could be some fragments Ender congealed in this area versus a solid stone. No obvious distal stones noted or stones overlying the renal contours.          Urinalysis w/Scope - 81001 Dipstick Dipstick Cont'd Micro  Color: Yellow Bilirubin: Neg mg/dL WBC/hpf: NS (Not Seen)  Appearance: Clear Ketones: Neg mg/dL RBC/hpf: 20 - 40/hpf  Specific Gravity: 1.025 Blood: 3+ ery/uL Bacteria: Few (10-25/hpf)  pH: 6.0 Protein: 2+ mg/dL Cystals: NS (Not Seen)  Glucose: Neg mg/dL Urobilinogen: 1.0 mg/dL Casts: NS (Not Seen)    Nitrites: Neg Trichomonas: Not Present    Leukocyte Esterase: Neg leu/uL Mucous: Not Present      Epithelial Cells: NS (Not Seen)      Yeast: NS (Not Seen)      Sperm: Not Present    ASSESSMENT:      ICD-10 Details  1 GU:   Ureteral calculus - A999333 Acute, Complicated Injury   PLAN:            Medications New Meds: Percocet 5 mg-325 mg  tablet 1 tablet PO Q 6 H   #20  0 Refill(s)            Orders X-Rays: C.T. Stone Protocol Without I.V. Contrast. No Oral Contrast    KUB  X-Ray Notes: History:   Hematuria: Yes/No  Patient to see MD after exam: Yes/No  Previous exam: CT / IVP/ US/ KUB/ None  When:  Where:  Diabetic: Yes/ No  BUN/ Creatine:  Date of last BUN Creatinine:  Weight in pounds:  Allergy- Contrasts/ Shellfish: Yes/ No  Conflicting diabetic meds: Yes/ No  Oral contrast and instructions given to patient:   Prior Authorization #: [id number] valid 10/28/20 thru 11/27/20            Schedule         Document Letter(s):  Created for Patient: Clinical Summary         Notes:   We discussed treatment options for his ureteral calculus and renal calculus. Currently pain levels 3 to 4/10 afebrile no nausea. I am going to is scheduled for cysto and insertion of right JJ stent 1st available with the thought of leaving this for 7-10 days and coming back for definitive ureteroscopy and laser lithotripsy so we can try to get to the kidney get all the stones as well as the ureteral calculus. Patient agreeable with this approach. Risks and benefits procedure were discussed as outlined below.  I have recommended retrograde pyelogram, ureteroscopic stone manipulation with laser lithotripsy. I have discussed in detail the risks, benefits and alternatives of ureteroscopic stone extraction  to include but not limited to: Bleeding, infection, ureteral perforation with need for open repair, inability to place the stent necessitating the need for further procedures, possible percutaneous nephrostomy tube placement, discomfort from the stents, hematuria, urgency, frequency and refractory problems after the stent is removed. I discussed the stent is not a permanent stent and will require a followup for stent removal or stent exchange. The patient knows there is high risk for ureteral stent incrustation if this is not removed or  exchanged within 3 months. Patient voices understanding of the risks and benefits of the procedure and consents to the procedure.

## 2020-10-29 NOTE — Telephone Encounter (Signed)
Left a detailed message for Grant Bowers at requesting surgeon's office that we needed a clearance for the Ureteroscopy and she could fax it, # given, or she can phone it in, # given.

## 2020-10-29 NOTE — Op Note (Signed)
Preoperative diagnosis: Obstructing right ureteral calculi  Postoperative diagnosis: Same  Interval procedure: Cystoscopy, right retrograde ureteropyelogram, fluoroscopic interpretation, placement of 6 French by 28 cm contour double-J stent without tether  Surgeon: Luiscarlos Kaczmarczyk  Anesthesia: General with LMA  Complications: None  Drains: None  Estimated blood loss: None  Indications: 75 year old male with a large right UPJ stone and a right distal ureteral stone.  He has significant hydronephrosis and symptoms from this.  He was scheduled for routine stent placement this morning.  However, the patient did not remain n.p.o. as recommended before his procedure, and he presents at this time for delayed placement of his stent.  The patient has been counseled by Dr. Milford Cage, and is aware of risks, complications as well as expected outcomes and follow-up secondary procedure for stone management.  He desires to proceed.  Description of procedure: Patient was properly identified and marked in the holding area.  Was taken to the operating room where general anesthetic and IV antibiotics were administered.  He was placed in the dorsolithotomy position.  Genitalia and perineum were prepped, draped, proper timeout performed.  66 French panendoscope was advanced into the bladder and direct vision.  Urethra normal, prostate moderately obstructive with bilobar hypertrophy.  Bladder inspected circumferentially.  No tumors, trabeculations, foreign bodies.  Ureteral orifices normal in configuration and location bilaterally.  Right ureteral orifice was then cannulated with a 6 [place] open-ended catheter and retrograde ureteropyelogram was performed.  This revealed a normal distal ureter with a filling defect in the distal ureter approximately 6 to 7 cm up.  There was significant hydroureter and colonization of contrast as well as tortuosity above that.  Due to the capacious nature of the ureter proximally, I was unable  to totally fill the collecting system or see the proximal ureteral stone.  At this point, the sensor tip guidewire was advanced through the open-ended catheter, easily up the ureter and into the presumed right upper pole calyces.  Open-ended catheter was removed, 28 cm 6 French double-J stent was placed easily over the sensor tip guidewire, and once properly positioned was deployed in the ureter with excellent proximal and distal curl seen using fluoroscopy and cystoscopy, respectively.  The bladder was drained, the scope removed and the procedure terminated.  The patient was awakened, taken to the PACU in stable condition, having tolerated the procedure well.

## 2020-10-29 NOTE — Progress Notes (Signed)
Notified Short Stay, Santiago Glad, RN that patient has left and will return to admitting no later than 3:45 pm.  Patient understands that he is not to have anything by mouth and he and his wife will get a hotel room and [place] or take a cab there after discharge since his wife is unable to drive after dark.

## 2020-10-29 NOTE — Telephone Encounter (Signed)
   Name: Grant Bowers  DOB: 09-Jun-1945  MRN: [id number]   Primary Cardiologist: None  Chart reviewed as part of pre-operative protocol coverage for cystoscopy with ureteral stent placement same day 10/29/2020.   Trampas Ravens was last seen on 10/12/20 by Dr. Caryl Comes.  He denied any chest pain or shortness of breath at rest.  It was noted he had history of shortness of breath on exertion post-COVID and irregular heartbeats by BP machine but without palpitations.  Since his device placement, he was feeling better and noted improvement in dyspnea.  He was able to walk further while golfing.  His BP was suboptimally controlled with hydrochlorothiazide increased to 25 mg.  Based on ACC/AHA guidelines, the patient would be at acceptable risk for the planned procedure without further cardiovascular testing. He does not have any antiplatelets or anticoagulants to hold.  I will route this recommendation to the requesting party via Epic fax function and remove from pre-op pool. Please call with questions.  Arvil Chaco, PA-C 10/29/2020, 12:18 PM

## 2020-10-29 NOTE — Anesthesia Preprocedure Evaluation (Deleted)
Anesthesia Evaluation    Reviewed: Allergy & Precautions, Patient's Chart, lab work & pertinent test results  Airway        Dental   Pulmonary sleep apnea and Continuous Positive Airway Pressure Ventilation ,           Cardiovascular hypertension, Pt. on medications + dysrhythmias   Echo: 1. Left ventricular ejection fraction, by estimation, is 65 to 70%. The  left ventricle has normal function. The left ventricle has no regional  wall motion abnormalities. There is mild left ventricular hypertrophy.  Left ventricular diastolic parameters  are consistent with Grade I diastolic dysfunction (impaired relaxation).  The average left ventricular global longitudinal strain is 24.0 %. The  global longitudinal strain is normal.  2. Right ventricular systolic function is normal. The right ventricular  size is normal.  3. Left atrial size was moderately dilated.  4. The pericardial effusion is circumferential.  5. The mitral valve is normal in structure. Mild mitral valve  regurgitation. No evidence of mitral stenosis.  6. The aortic valve is tricuspid. Aortic valve regurgitation is not  visualized. No aortic stenosis is present.  7. The inferior vena cava is normal in size with greater than 50%  respiratory variability, suggesting right atrial pressure of 3 mmHg.    Neuro/Psych negative neurological ROS  negative psych ROS   GI/Hepatic Neg liver ROS, GERD  Medicated,  Endo/Other  negative endocrine ROS  Renal/GU      Musculoskeletal negative musculoskeletal ROS (+)   Abdominal   Peds  Hematology negative hematology ROS (+)   Anesthesia Other Findings   Reproductive/Obstetrics                             Anesthesia Physical Anesthesia Plan  ASA: 3  Anesthesia Plan: General   Post-op Pain Management:    Induction: Intravenous  PONV Risk Score and Plan: 3 and Ondansetron,  Dexamethasone and Treatment may vary due to age or medical condition  Airway Management Planned: LMA  Additional Equipment: None  Intra-op Plan:   Post-operative Plan: Extubation in OR  Informed Consent:   Plan Discussed with: CRNA  Anesthesia Plan Comments:         Anesthesia Quick Evaluation

## 2020-10-29 NOTE — Transfer of Care (Signed)
Immediate Anesthesia Transfer of Care Note  Patient: Grant Bowers  Procedure(s) Performed: CYSTOSCOPY WITH RETROGRADE PYELOGRAM, URETEROSCOPY AND STENT PLACEMENT (Right)  Patient Location: PACU  Anesthesia Type:General  Level of Consciousness: awake, alert , oriented and patient cooperative  Airway & Oxygen Therapy: Patient Spontanous Breathing and Patient connected to face mask oxygen  Post-op Assessment: Report given to RN and Post -op Vital signs reviewed and stable  Post vital signs: Reviewed and stable  Last Vitals:  Vitals Value Taken Time  BP 144/70 10/29/20 1853  Temp    Pulse 79 10/29/20 1855  Resp 14 10/29/20 1855  SpO2 100 % 10/29/20 1855  Vitals shown include unvalidated device data.  Last Pain:  Vitals:   10/29/20 1521  TempSrc: Oral  PainSc: 4       Patients Stated Pain Goal: 7 ([id number])  Complications: No notable events documented.

## 2020-10-29 NOTE — Interval H&P Note (Signed)
History and Physical Interval Note:  10/29/2020 4:59 PM  Grant Bowers  has presented today for surgery, with the diagnosis of [place].  The various methods of treatment have been discussed with the patient and family. After consideration of risks, benefits and other options for treatment, the patient has consented to  Procedure(s): CYSTOSCOPY WITH RETROGRADE PYELOGRAM, URETEROSCOPY AND STENT PLACEMENT (Right) as a surgical intervention.  The patient's history has been reviewed, patient examined, no change in status, stable for surgery.  I have reviewed the patient's chart and labs.  Questions were answered to the patient's satisfaction.     Lillette Boxer Keiron Iodice

## 2020-10-29 NOTE — Telephone Encounter (Signed)
   [facility] Pre-operative Risk Assessment    Patient Name: Grant Bowers  DOB: 13-Aug-1945 MRN: [id number]  HEARTCARE STAFF:  - IMPORTANT!!!!!! Under Visit Info/Reason for Call, type in Other and utilize the format Clearance MM/DD/YY or Clearance TBD. Do not use dashes or single digits. - Please review there is not already an duplicate clearance open for this procedure. - If request is for dental extraction, please clarify the # of teeth to be extracted. - If the patient is currently at the dentist's office, call Pre-Op Callback Staff (MA/nurse) to input urgent request.  - If the patient is not currently in the dentist office, please route to the Pre-Op pool.  Request for surgical clearance:  What type of surgery is being performed?  CYSTO, URETERAL STENT PLACEMENT   When is this surgery scheduled? TODAY  What type of clearance is required (medical clearance vs. Pharmacy clearance to hold med vs. Both)?  MEDICAL  Are there any medications that need to be held prior to surgery and how long?  N/A  Practice name and name of physician performing surgery?  [facility]  What is the office phone number?  [phone]   7.   What is the office fax number?  [phone]  8.   Anesthesia type (None, local, MAC, general) ?     Jeanann Lewandowsky 10/29/2020, 12:07 PM  _________________________________________________________________   (provider comments below)

## 2020-10-29 NOTE — Telephone Encounter (Signed)
Lm to call back ./cy 

## 2020-10-29 NOTE — Anesthesia Procedure Notes (Signed)
Procedure Name: LMA Insertion Date/Time: 10/29/2020 6:18 PM Performed by: Montel Clock, CRNA Pre-anesthesia Checklist: Patient identified, Emergency Drugs available, Suction available, Patient being monitored and Timeout performed Patient Re-evaluated:Patient Re-evaluated prior to induction Oxygen Delivery Method: Circle system utilized Preoxygenation: Pre-oxygenation with 100% oxygen Induction Type: IV induction Ventilation: Mask ventilation without difficulty LMA: LMA with gastric port inserted LMA Size: 4.0 Number of attempts: 1 Dental Injury: Teeth and Oropharynx as per pre-operative assessment

## 2020-10-29 NOTE — H&P (Signed)
Vash Prevot is a 75 year-old male established patient who is here for follow up for further evaluation of his elevated PSA.   His last PSA was performed 09/12/2018. The last PSA value was 5.42.   He has had a prostate biopsy. Patient does not have a family history of prostate cancer. The patient states he does not take 5 alpha reductase inhibitor medication.   He has not recently had unwanted weight loss. He is not having new bone pain. This condition would be considered of mild to moderate severity with no modifying factors or associated signs or symptoms other than as noted above.   08/22/16: He was found to have an elevated PSA of 5.41 on 08/09/16. He has been on testosterone injections monthly for hypogonadism. He was recently found to have a low serum testosterone and was told to increase his testosterone injections to every 2 weeks.   03/06/17: He reports that he has not had any change in his voiding pattern. He does have erectile dysfunction and is currently using Cialis that he gets from [place].   03/14/18: He continues to do well with no new urologic complaints. Specifically no voiding difficulties. He uses Cialis in this remains effective for his erectile dysfunction.   10/03/18: He returns today for follow-up of an elevated PSA and has been doing well from urologic standpoint. He continues to use Cialis at a 20 mg dose. No new voiding complaints are noted. No bone pain or unexplained weight loss. -10/30/19-Patient history of BPH and marginal PSA elevation as above. Been doing well. Remains on tamsulosin 0.4 mg daily and voiding well. Most recent PSA now down to 4.27 on 10/06/2019. This is down from 4.49 in December of 2020. Patient has had negative prostate biopsy on 11/16/2014. Patient continues to take Cialis as needed for erectile dysfunction. -04/19/20-patient with history of elevated PSA, BPH and nephrolithiasis and erectile dysfunction. Patient had recent PSA 4.27 on 10/06/2019. Repeat PSA  on 04/17/2020 is now 3.97. Baseline values run between 4 and 6.   Second issue is that of erectile dysfunction. Patient has used sildenafil with some limited success in the past. He was interested in looking at information regarding vacuum erection device that he had researched online. I gave him web site for [employer] [facility] to look at the devices.   Patient has had history of nephrolithiasis and had recent episode of back pain with associated hematuria after playing off last week. Currently minimally symptomatic. KUB is reviewed today and shows several calcifications ranging in the 4-5 mm range overlying the lower caliceal area of the right kidney. There is also a probable 7 mm calcification in the region of the right upper ureter/UPJ area. No obvious distal stone seen on the right and no obvious stones on the left. No bony abnormalities noted. Micro urinalysis today shows 3-10 RBCs. CT urogram was obtained and by initial review shows: What appears to be probable parapelvic cyst region the right kidney with 7-8 mm calcification at the upper ureter/UPJ area with perhaps mild hydro noted. There is also a cluster of small stones in the right lower caliceal area. Over read is pending -05/21/20-patient with history of 7 mm right upper ureteral calculus. Underwent ESL on 05/01/2020 by Dr. pace. Passed some blood but no definitive stone fragments. He is currently pain-free. Here for evaluation and management post lithotripsy. KUB is reviewed today shows persistent calcification in the previously treated area but the stone looks significantly fragmented and splayed consistent with good fragmentation.  06/04/2020: Returns today for follow-up exam. He denies interval stone material passage. He has had no recurrence of pain/discomfort. Voiding at his baseline with stable, grossly non bothersome symptomatology. KUB today demonstrate the continued presence of a fragmented right ureteral calculi. Cluster of stone  material remains in a fragmented/hazy pattern along the right proximal ureter but grossly unchanged in location since time of last office visit exam.   Fortunately the patient remains asymptomatic and is voiding at his baseline. We discussed repeat treatment options including shockwave lithotripsy as well as ureteroscopy. He understands risks versus benefits and success chances with both procedures. Ultimately he would prefer shockwave lithotripsy. I will discuss this with his urologist and then have the patient contacted schedule appropriately. He will continue tamsulosin in the interval and also continue to observe for any interval stone material passage. In the event this occurs he will contact the office; with any new or worsening symptomatology including uncontrollable pain/discomfort or worsening lower urinary tract symptoms he will follow up accordingly as well.     -10/28/20-patient with history of right upper ureteral calculus underwent in situ ESL on 05/01/2020 with follow-up showing some persistent calcification in the region of the treated area. This was back on 06/04/2020. The patient was scheduled for stage retreat of the stone however when he was on the lithotripsy treatment truck was noted to have bradycardia in the procedure was canceled. Subsequently seen by Cardiology and he ended up getting a pacemaker. He has not passed a definitive stone. He has over the last several days had some lower abdominal discomfort but no flank pain or hematuria or fever. Also had some constipation issues. Noted some black color to the stool over the last couple of days after taking 800 mg of ibuprofen for the pain. Here now for evaluation for possible persistent stone. Micro urinalysis today shows 20-40 RBCs KUB is reviewed today shows: No obvious bony abnormalities. There is a calcification region of L3 transverse process on the right which measures about 8 mm which could be some fragments Ender congealed in  this area versus a solid stone. No obvious distal stones noted or stones overlying the renal contours. CT urogram was obtained in by initial review shows what appears to be a fragment at the outlet at the UPJ area on the right and I think some parapelvic cysts which been present on prior CT scan. Following the ureter there is what appears to be approximate 8-9 mm calcification or conglomeration of [place]str[place] perhaps at the crossing of the iliac vessel on the right. Distal to this point ureter is normal caliber down to the bladder. Over read is pending       ALLERGIES: Atorvastatin Calcium TABS     MEDICATIONS: Cialis 20 mg tablet 1 tablet PO Every Other Day PRN Amlodipine Besylate 10 mg tablet Oral Aspirin 81 MG TABS Oral Avapro 300 MG Oral Tablet Oral Cialis 5 mg tablet 1 Oral Fluticasone Propionate 50 mcg/actuation spray, suspension Nasal Omeprazole 40 MG Oral Capsule Delayed Release Oral Simvastatin 20 MG Oral Tablet Oral Testosterone Cypionate 200 mg/ml vial Intramuscular     GU PSH: Cystoscopy - 2018 ESWL, Right - 04/29/2020 Locm 300-'399Mg'$ /Ml Iodine,1Ml - 2018 Prostate Needle Biopsy - 2016 Ureteroscopic stone removal - 2016        PSH Notes: Biopsy Of The Prostate Needle, Cystoscopy With Ureteroscopy With Removal Of Calculus   NON-GU PSH: None   GU PMH: Renal calculus, Right - 06/04/2020, Right, - 04/19/2020, Right, He was noted to  have 3 right renal calculi in the lower pole causing no obstruction., - 2018, Kidney stone on right side, - 2014 Ureteral calculus - 06/04/2020, - 05/21/2020 BPH w/o LUTS (Stable) - 04/19/2020, (Stable), He has a markedly enlarged prostate by exam. He does not have any significant voiding symptoms, - 2018, He does have BPH by exam. He does not have any significant obstructive voiding symptoms., - 2018 Elevated PSA - 04/19/2020, (Stable), He was again noted to have an enlarged prostate but has no nodularity or worrisome induration. In addition his PSA,  although elevated at 5.42, maintains a good free to total ratio and actually has been quite stable over time., - 2020 (Improving), Her his prostate remains benign to exam and his most recent PSA was 4.42. I will check his PSA today and plan to see him back in 6 months., - 2019 (Stable), His PSA remains elevated but he has an excellent free to total ratio and it is currently lower that it is been. His prostate is enlarged and most likely accounts for most of his slight PSA elevation. I have recommended continued monitoring of his PSA every 6 months., - 2018 (Stable), We discussed the fact that he does have an elevated PSA and it has increased slightly. While this very likely could be due to the effects of his testosterone replacement I have recommended we monitor this a little bit more closely with a repeat PSA again in 3 months and then again in 6 months., - 2018 (Worsening), I have discussed with the patient the possible need for further evaluation of his elevated PSA. We have discussed the options which would be continued observation with serial DRE and PSAs versus proceeding with further evaluation at this time with TRUS/Bx. We have discussed the possible risk of progression and spread of prostate cancer if present currently as well as the fact that typically prostate cancer tends to be a relatively slow-growing form of cancer that typically would not progress significantly over the relative short period of time between serial examinations. We also have discussed proceeding at this time with a prostate biopsy and I therefore have gone over the procedure with him in detail. He is familiar with a prostate biopsy having undergone one in the past. We have elected to proceed with close monitoring of his PSA since he had no worrisome findings on DRE and his PSA is currently just slightly above where it was in 6/16 when it was 5.1 with a previous negative prostate biopsy., - 2018, Elevated PSA, - 2016 Microscopic  hematuria (Stable) - 04/19/2020, (Stable), I have discussed with the patient the fact that the CT scan has revealed no abnormality of the upper tract that could contribute to the presence of microscopic hematuria and cystoscopically no abnormality of the lower tract was identified. In this case current recommendations are that the patient be followed for microscopic hematuria for 3 - 5 years with urinalysis and serum creatinine on a yearly basis. If the urine remains positive for microscopic hematuria and the patient develops new hypertension, proteinuria or blood cell casts on urinalysis evaluation for primary renal disease should be undertaken. If, on the other hand, microscopic hematuria persists in the absence of any findings to suggest primary renal disease, then repeat urologic evaluation should be considered. In addition if the patient has 2 consecutive negative urinalyses yearly over a two-year period then no further urinalyses for the purpose of evaluation of asymptomatic microscopic hematuria are necessary., - 2018, I have discussed with the  patient that the evaluation of asymptomatic microscopic hematuria consists of excluding benign causes including menstruation in women as well as, in both sexes, vigorous exercise, sexual activity, viral illnesses, trauma and infection. If these factors are negative and the urinalysis reveals significant proteinuria, dysmorphic red blood cells or red cell casts and/or an elevated serum creatinine then evaluation by a nephrologist is indicated. If conditions suggestive of primary renal disease are not present and the patient has a low risk of urothelial malignancy consisting of age less than 40 years, no smoking history, no history of chemical exposure, no irritative voiding symptoms, no history of gross hematuria and no history of urologic disorder or disease then upper tract imaging should be undertaken with a CT scan and consideration of cystoscopy versus urine  cytology as well. If, on the other hand the patient is at high risk then a complete evaluation should be undertaken including upper tract imaging with CT scan and cystoscopy and consideration of urine cytology as well., - 2018 Renal and ureteral calculus - 04/19/2020 BPH w/LUTS (Stable), He has known BPH. He has been taking tamsulosin 0.4 mg. - 2020 Hematospermia, I have reassured him of the fact that his hematospermia is almost certainly not due to prostate cancer or other significant pathology especially with clear urine and no pain. - 2020 Peyronies Disease, He has very mild Peyronie's disease. It is not impacting his ability to engage in intercourse. We have elected to continue to monitor this. - 2020 Urinary Urgency, He has developed some urgency and so I have suggested he try increasing his dosage of tamsulosin to 0.8 mg and if this is helpful he is going to contact me for a new prescription. - 2020 Epididymitis - 2019 Orchitis - 2019 Renal cyst, Bilateral, He was found to have a 7 cm lobulated simple cyst in the right kidney with smaller bilateral cysts noted as well. These are of no clinical significance. - 2018, Renal cyst, acquired, right, - 2014 ED due to arterial insufficiency, Erectile dysfunction due to arterial insufficiency - 2016 Primary hypogonadism, Hypogonadism - 2014, Hypogonadism, testicular, - 2014      PMH Notes: Hypogonadism: Testosterone level (T/F): 12/12 - 561/131 12/13 - 168/34   Nephrolithiasis: He reports having passed multiple stones over the years. He required ureteroscopic extraction of a stone in 2012. CT scan in 4/14 revealed a calcification in the upper pole of the right kidney that was felt to either represent a small, nonobstructing renal calculus versus a vascular calcification. He did undergo ureteroscopy in [place] in the past. CT scan 5/18 - single 3 mm stone in the lower pole of the right kidney.   Renal cysts: CT scan without contrast on 08/07/12 -  lesions within the right kidney. CT scan with contrast that same day which revealed the lesions within the right kidney to be simple cysts. The study also indicated there was a calcification in the upper pole of the right kidney that was felt to represent either a small, nonobstructing renal calculus versus a vascular calcification. CT scan 5/18 - bilateral renal cysts   Elevated PSA: He was found to have a PSA of 5.15 in 6/16 which was up from 4.41 the previous year. No family history of prostate cancer. No abnormality on DRE. TRUS/BX 11/16/14: BPH only PSAs: 12/12 - 3.04 12/13 - 3.44 5/15 - 4.41 6/16 - 5.15   Erectile dysfunction: In 8/16 he was started on Cialis 5 mg for erectile dysfunction.   Microscopic hematuria: He was found  to have microscopic hematuria and 5/18. A CT scan revealed a single right renal calculus but no other abnormality and cystoscopy was negative.   NON-GU PMH: Encounter for general adult medical examination without abnormal findings, Encounter for preventive health examination - 2016 Personal history of other diseases of the circulatory system, History of hypertension - 2014 Personal history of other diseases of the digestive system, History of esophageal reflux - 2014     FAMILY HISTORY: Cancer - Runs In Family Carcinoma Of The Pancreas - Runs In Family Death In The Family Father - Runs In Family Death In The Family Mother - Runs In Family No pertinent family history - Other   SOCIAL HISTORY: Marital Status: Married Preferred Language: English; Ethnicity: Not Hispanic Or Latino; Race: White Current Smoking Status: Patient has never smoked.   Tobacco Use Assessment Completed: Used Tobacco in last 30 days?     Notes: Alcohol Use, Marital History - Currently Married, Caffeine Use, Never A Smoker   REVIEW OF SYSTEMS:    GU Review Male:   Patient denies frequent urination, hard to postpone urination, burning/ pain with urination, get up at night to urinate,  leakage of urine, stream starts and stops, trouble starting your stream, have to strain to urinate , erection problems, and penile pain. Gastrointestinal (Upper):   Patient denies nausea, vomiting, and indigestion/ heartburn. Gastrointestinal (Lower):   Patient denies diarrhea and constipation. Constitutional:   Patient denies fever, night sweats, weight loss, and fatigue. Skin:   Patient denies skin rash/ lesion and itching. Eyes:   Patient denies blurred vision and double vision. Ears/ Nose/ Throat:   Patient denies sore throat and sinus problems. Hematologic/Lymphatic:   Patient denies swollen glands and easy bruising. Cardiovascular:   Patient denies leg swelling and chest pains. Respiratory:   Patient denies cough and shortness of breath. Endocrine:   Patient denies excessive thirst. Musculoskeletal:   Patient denies back pain and joint pain. Neurological:   Patient denies headaches and dizziness. Psychologic:   Patient denies depression and anxiety.   Notes: Patient having lower stomach discomfort. Patient had to have a pacemaker and Dr Caryl Comes stated he can move forward getting kidney stone removed.     VITAL SIGNS: None   MULTI-SYSTEM PHYSICAL EXAMINATION:    Constitutional: Well-nourished. No physical deformities. Normally developed. Good grooming. Neck: Neck symmetrical, not swollen. Normal tracheal position. Respiratory: No labored breathing, no use of accessory muscles.   Cardiovascular: Normal temperature, normal extremity pulses, no swelling, no varicosities. Lymphatic: No enlargement of neck, axillae, groin. Skin: No paleness, no jaundice, no cyanosis. No lesion, no ulcer, no rash. Neurologic / Psychiatric: Oriented to time, oriented to place, oriented to person. No depression, no anxiety, no agitation. Gastrointestinal: It patient's abdomen is soft he has no rebound tenderness is slightly tender in the suprapubic area but no masses noted. He has no CVA tenderness. Eyes:  Normal conjunctivae. Normal eyelids. Ears, Nose, Mouth, and Throat: Left ear no scars, no lesions, no masses. Right ear no scars, no lesions, no masses. Nose no scars, no lesions, no masses. Normal hearing. Normal lips. Musculoskeletal: Normal gait and station of head and neck.     Complexity of Data:  04/13/20 10/06/19 03/28/19 09/12/18 03/14/18 01/07/18 09/06/17 02/27/17 PSA Total PSA 3.97 ng/mL 4.27 ng/mL 4.49 ng/mL 5.42 ng/mL 5.43 ng/mL 4.42 ng/mL 5.64 ng/mL 4.80 ng/mL Free PSA  1.17 ng/mL 1.04 ng/mL 1.89 ng/mL  1.35 ng/mL 1.52 ng/mL 1.39 ng/mL % Free PSA  27 % PSA 23 %  PSA 35 % PSA  31 % PSA 27 % PSA 29 % PSA     PROCEDURES:         C.T. Urogram - [id number]     Patient confirmed No Neulasta OnPro Device.            KUB - [id number] A single view of the abdomen is obtained.     Patient confirmed No Neulasta OnPro Device. KUB is reviewed today shows: No obvious bony abnormalities. There is a calcification region of L3 transverse process on the right which measures about 8 mm which could be some fragments Ender congealed in this area versus a solid stone. No obvious distal stones noted or stones overlying the renal contours.            Urinalysis w/Scope - 81001 Dipstick Dipstick Cont'd Micro Color: Yellow Bilirubin: Neg mg/dL WBC/hpf: NS (Not Seen) Appearance: Clear Ketones: Neg mg/dL RBC/hpf: 20 - 40/hpf Specific Gravity: 1.025 Blood: 3+ ery/uL Bacteria: Few (10-25/hpf) pH: 6.0 Protein: 2+ mg/dL Cystals: NS (Not Seen) Glucose: Neg mg/dL Urobilinogen: 1.0 mg/dL Casts: NS (Not Seen)   Nitrites: Neg Trichomonas: Not Present   Leukocyte Esterase: Neg leu/uL Mucous: Not Present     Epithelial Cells: NS (Not Seen)     Yeast: NS (Not Seen)     Sperm: Not Present     ASSESSMENT:     ICD-10 Details 1 GU:   Ureteral calculus - [id number] Acute, Complicated Injury   PLAN:             Notes:   We discussed treatment options for his ureteral calculus and renal calculus. Currently pain  levels 3 to 4/10 afebrile no nausea. I am going to is scheduled for cysto and insertion of right JJ stent 1st available with the thought of leaving this for 7-10 days and coming back for definitive ureteroscopy and laser lithotripsy so we can try to get to the kidney get all the stones as well as the ureteral calculus. Patient agreeable with this approach. Risks and benefits procedure were discussed as outlined below. I have recommended retrograde pyelogram, ureteroscopic stone manipulation with laser lithotripsy. I have discussed in detail the risks, benefits and alternatives of ureteroscopic stone extraction to include but not limited to: Bleeding, infection, ureteral perforation with need for open repair, inability to place the stent necessitating the need for further procedures, possible percutaneous nephrostomy tube placement, discomfort from the stents, hematuria, urgency, frequency and refractory problems after the stent is removed. I discussed the stent is not a permanent stent and will require a followup for stent removal or stent exchange. The patient knows there is high risk for ureteral stent incrustation if this is not removed or exchanged within 3 months. Patient voices understanding of the risks and benefits of the procedure and consents to the procedure.

## 2020-11-01 ENCOUNTER — Encounter (HOSPITAL_COMMUNITY): Payer: Self-pay | Admitting: Urology

## 2020-11-01 ENCOUNTER — Other Ambulatory Visit: Payer: Self-pay | Admitting: Urology

## 2020-11-01 NOTE — Progress Notes (Signed)
Remote pacemaker transmission.   

## 2020-11-02 ENCOUNTER — Encounter: Payer: Self-pay | Admitting: Internal Medicine

## 2020-11-02 NOTE — Progress Notes (Signed)
[facility]  Patient Information: Name:  Grant Bowers  DOB:  03/05/1946  MRN:  [id number]    From: Burnard Leigh, RN  Sent: 11/01/2020   5:41 PM EDT  To: [facility]  Subject: Perioperative cardiac device programming       Planned Procedure: Cystoscopy / Retrograde Pyelogram/ Ureteroscopy with laser lithotripsy/ possible ureter stent placement  Surgeon:  Dr Harold Barban  Date of Procedure:  11-09-2020  Cautery will be used.  Position during surgery:  Lithotomy   Please send documentation back to:  [facility] (Fax # [phone])   Phineas Inches, RN  Device Information:  Clinic EP Physician:  Virl Axe, MD   Device Type:  Pacemaker Manufacturer and Phone #:  [employer]: [phone] Pacemaker Dependent?:  No. Date of Last Device Check:  10/12/20 Normal Device Function?:  Yes.    Electrophysiologist's Recommendations:  Have magnet available. Provide continuous ECG monitoring when magnet is used or reprogramming is to be performed.  Procedure may interfere with device function.  Magnet should be placed over device during procedure.  Per Device Clinic Standing Orders, York Ram, RN  12:28 PM 11/02/2020

## 2020-11-03 ENCOUNTER — Other Ambulatory Visit: Payer: Self-pay

## 2020-11-03 ENCOUNTER — Encounter (HOSPITAL_BASED_OUTPATIENT_CLINIC_OR_DEPARTMENT_OTHER): Payer: Self-pay | Admitting: Urology

## 2020-11-03 NOTE — Progress Notes (Addendum)
ADDENDUM:  reviewed chart w/ anesthesia, Dr Macario Golds [facility],  stated pt did not need another cardiology clearance and device order for this procedure is completed and copy in chart.  Dr Daiva Huge stated ok to proceed.   Spoke w/ via phone for pre-op interview--- Pt Lab needs dos---- Istat              Lab results------ current ekg in epic/ chart COVID test -----patient states asymptomatic no test needed  Arrive at -------  0645 on 11-09-2020 NPO after MN with exception sips of water w/ meds Med rec completed Medications to take morning of surgery ----- Zyrtec, Crestor, Norvasc, Flomax, Prilosec Diabetic medication ----- n/a Patient instructed to bring photo id and insurance card day of surgery Patient aware to have Driver (ride ) / caregiver  for 24 hours after surgery --wife, Collie Siad Patient Special Instructions ----- n/a  Pre-Op special Istructions ----- Sent inbox message request for device orders, received 11-02-2020 in epic/ chart.    Patient verbalized understanding of instructions that were given at this phone interview. Patient denies shortness of breath, chest pain, fever, cough at this phone interview.    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   PCP: Dr Marshall Cork (lov 06-01-2020 epic) Cardiologist : Dr Grace Bushy Cassell Clement 10-12-2020 epic)  with pacer check Completed device order [id number], in epic and copy w/ chart   Chest x-ray : 07-09-2020 EKG : 10-12-2020 Echo : 07-29-2020 Stress test:  02-15-2010 epic Cardiac Cath : 02-17-2010 epic Activity level: sob w/ stairs and long distance walking Sleep Study/ CPAP : YES/ YES

## 2020-11-08 NOTE — H&P (Signed)
Grant Bowers is a 75 year-old male established patient who is here for follow up for further evaluation of his elevated PSA.   His last PSA was performed 09/12/2018. The last PSA value was 5.42.   He has had a prostate biopsy. Patient does not have a family history of prostate cancer. The patient states he does not take 5 alpha reductase inhibitor medication.   He has not recently had unwanted weight loss. He is not having new bone pain. This condition would be considered of mild to moderate severity with no modifying factors or associated signs or symptoms other than as noted above.   08/22/16: He was found to have an elevated PSA of 5.41 on 08/09/16. He has been on testosterone injections monthly for hypogonadism. He was recently found to have a low serum testosterone and was told to increase his testosterone injections to every 2 weeks.   03/06/17: He reports that he has not had any change in his voiding pattern. He does have erectile dysfunction and is currently using Cialis that he gets from [place].   03/14/18: He continues to do well with no new urologic complaints. Specifically no voiding difficulties. He uses Cialis in this remains effective for his erectile dysfunction.   10/03/18: He returns today for follow-up of an elevated PSA and has been doing well from urologic standpoint. He continues to use Cialis at a 20 mg dose. No new voiding complaints are noted. No bone pain or unexplained weight loss.  -10/30/19-Patient history of BPH and marginal PSA elevation as above. Been doing well. Remains on tamsulosin 0.4 mg daily and voiding well. Most recent PSA now down to 4.27 on 10/06/2019. This is down from 4.49 in December of 2020. Patient has had negative prostate biopsy on 11/16/2014. Patient continues to take Cialis as needed for erectile dysfunction.  -04/19/20-patient with history of elevated PSA, BPH and nephrolithiasis and erectile dysfunction. Patient had recent PSA 4.27 on 10/06/2019. Repeat  PSA on 04/17/2020 is now 3.97. Baseline values run between 4 and 6.   Second issue is that of erectile dysfunction. Patient has used sildenafil with some limited success in the past. He was interested in looking at information regarding vacuum erection device that he had researched online. I gave him web site for [employer] [facility] to look at the devices.   Patient has had history of nephrolithiasis and had recent episode of back pain with associated hematuria after playing off last week. Currently minimally symptomatic.  KUB is reviewed today and shows several calcifications ranging in the 4-5 mm range overlying the lower caliceal area of the right kidney. There is also a probable 7 mm calcification in the region of the right upper ureter/UPJ area. No obvious distal stone seen on the right and no obvious stones on the left. No bony abnormalities noted.  Micro urinalysis today shows 3-10 RBCs.  CT urogram was obtained and by initial review shows: What appears to be probable parapelvic cyst region the right kidney with 7-8 mm calcification at the upper ureter/UPJ area with perhaps mild hydro noted. There is also a cluster of small stones in the right lower caliceal area. Over read is pending  -05/21/20-patient with history of 7 mm right upper ureteral calculus. Underwent ESL on 05/01/2020 by Dr. pace. Passed some blood but no definitive stone fragments. He is currently pain-free. Here for evaluation and management post lithotripsy.  KUB is reviewed today shows persistent calcification in the previously treated area but the stone looks significantly fragmented  and splayed consistent with good fragmentation.   06/04/2020: Returns today for follow-up exam. He denies interval stone material passage. He has had no recurrence of pain/discomfort. Voiding at his baseline with stable, grossly non bothersome symptomatology. KUB today demonstrate the continued presence of a fragmented right ureteral calculi.  Cluster of  stone material remains in a fragmented/hazy pattern along the right proximal ureter but grossly unchanged in location since time of last office visit exam.   Fortunately the patient remains asymptomatic and is voiding at his baseline. We discussed repeat treatment options including shockwave lithotripsy as well as ureteroscopy. He understands risks versus benefits and success chances with both procedures. Ultimately he would prefer shockwave lithotripsy. I will discuss this with his urologist and then have the patient contacted schedule appropriately. He will continue tamsulosin in the interval and also continue to observe for any interval stone material passage. In the event this occurs he will contact the office; with any new or worsening symptomatology including uncontrollable pain/discomfort or worsening lower urinary tract symptoms he will follow up accordingly as well.    -10/28/20-patient with history of right upper ureteral calculus underwent in situ ESL on 05/01/2020 with follow-up showing some persistent calcification in the region of the treated area. This was back on 06/04/2020. The patient was scheduled for stage retreat of the stone however when he was on the lithotripsy treatment truck was noted to have bradycardia in the procedure was canceled. Subsequently seen by Cardiology and he ended up getting a pacemaker. He has not passed a definitive stone. He has over the last several days had some lower abdominal discomfort but no flank pain or hematuria or fever. Also had some constipation issues. Noted some black color to the stool over the last couple of days after taking 800 mg of ibuprofen for the pain. Here now for evaluation for possible persistent stone.  Micro urinalysis today shows 20-40 RBCs  KUB is reviewed today shows: No obvious bony abnormalities. There is a calcification region of L3 transverse process on the right which measures about 8 mm which could be some fragments Ender  congealed in this area versus a solid stone. No obvious distal stones noted or stones overlying the renal contours.  CT urogram was obtained in by initial review shows what appears to be a fragment at the outlet at the UPJ area on the right and I think some parapelvic cysts which been present on prior CT scan. Following the ureter there is what appears to be approximate 8-9 mm calcification or conglomeration of Steinstrasse perhaps at the crossing of the iliac vessel on the right. Distal to this point ureter is normal caliber down to the bladder. Over read is pending  -11/08/20-patient with history of right ureteral calculus and right renal calculus as above.  Underwent cystoscopy insertion of right JJ stent on 10/29/2020 by Dr.Dahlstedt.  Presents now to undergo cystoscopy right ureteroscopy and pyeloscopy with laser lithotripsy of the ureteral and renal calculi.    ALLERGIES: Atorvastatin Calcium TABS    MEDICATIONS: Cialis 20 mg tablet 1 tablet PO Every Other Day PRN  Amlodipine Besylate 10 mg tablet Oral  Aspirin 81 MG TABS Oral  Avapro 300 MG Oral Tablet Oral  Cialis 5 mg tablet 1 Oral  Fluticasone Propionate 50 mcg/actuation spray, suspension Nasal  Omeprazole 40 MG Oral Capsule Delayed Release Oral  Simvastatin 20 MG Oral Tablet Oral  Testosterone Cypionate 200 mg/ml vial Intramuscular     GU PSH: Cystoscopy - 2018 ESWL, Right -  04/29/2020 Locm 300-'399Mg'$ /Ml Iodine,1Ml - 2018 Prostate Needle Biopsy - 2016 Ureteroscopic stone removal - 2016       PSH Notes: Biopsy Of The Prostate Needle, Cystoscopy With Ureteroscopy With Removal Of Calculus   NON-GU PSH: None   GU PMH: Renal calculus, Right - 06/04/2020, Right, - 04/19/2020, Right, He was noted to have 3 right renal calculi in the lower pole causing no obstruction., - 2018, Kidney stone on right side, - 2014 Ureteral calculus - 06/04/2020, - 05/21/2020 BPH w/o LUTS (Stable) - 04/19/2020, (Stable), He has a markedly enlarged prostate by  exam. He does not have any significant voiding symptoms, - 2018, He does have BPH by exam. He does not have any significant obstructive voiding symptoms., - 2018 Elevated PSA - 04/19/2020, (Stable), He was again noted to have an enlarged prostate but has no nodularity or worrisome induration. In addition his PSA, although elevated at 5.42, maintains a good free to total ratio and actually has been quite stable over time., - 2020 (Improving), Her his prostate remains benign to exam and his most recent PSA was 4.42. I will check his PSA today and plan to see him back in 6 months., - 2019 (Stable), His PSA remains elevated but he has an excellent free to total ratio and it is currently lower that it is been. His prostate is enlarged and most likely accounts for most of his slight PSA elevation. I have recommended continued monitoring of his PSA every 6 months., - 2018 (Stable), We discussed the fact that he does have an elevated PSA and it has increased slightly. While this very likely could be due to the effects of his testosterone replacement I have recommended we monitor this a little bit more closely with a repeat PSA again in 3 months and then again in 6 months., - 2018 (Worsening), I have discussed with the patient the possible need for further evaluation of his elevated PSA. We have discussed the options which would be continued observation with serial DRE and PSAs versus proceeding with further evaluation at this time with TRUS/Bx. We have discussed the possible risk of progression and spread of prostate cancer if present currently as well as the fact that typically prostate cancer tends to be a relatively slow-growing form of cancer that typically would not progress significantly over the relative short period of time between serial examinations. We also have discussed proceeding at this time with a prostate biopsy and I therefore have gone over the procedure with him in detail. He is familiar with a prostate  biopsy having undergone one in the past. We have elected to proceed with close monitoring of his PSA since he had no worrisome findings on DRE and his PSA is currently just slightly above where it was in 6/16 when it was 5.1 with a previous negative prostate biopsy., - 2018, Elevated PSA, - 2016 Microscopic hematuria (Stable) - 04/19/2020, (Stable), I have discussed with the patient the fact that the CT scan has revealed no abnormality of the upper tract that could contribute to the presence of microscopic hematuria and cystoscopically no abnormality of the lower tract was identified. In this case current recommendations are that the patient be followed for microscopic hematuria for 3 - 5 years with urinalysis and serum creatinine on a yearly basis. If the urine remains positive for microscopic hematuria and the patient develops new hypertension, proteinuria or blood cell casts on urinalysis evaluation for primary renal disease should be undertaken. If, on the other hand,  microscopic hematuria persists in the absence of any findings to suggest primary renal disease, then repeat urologic evaluation should be considered. In addition if the patient has 2 consecutive negative urinalyses yearly over a two-year period then no further urinalyses for the purpose of evaluation of asymptomatic microscopic hematuria are necessary., - 2018, I have discussed with the patient that the evaluation of asymptomatic microscopic hematuria consists of excluding benign causes including menstruation in women as well as, in both sexes, vigorous exercise, sexual activity, viral illnesses, trauma and infection. If these factors are negative and the urinalysis reveals significant proteinuria, dysmorphic red blood cells or red cell casts and/or an elevated serum creatinine then evaluation by a nephrologist is indicated. If conditions suggestive of primary renal disease are not present and the patient has a low risk of urothelial malignancy  consisting of age less than 40 years, no smoking history, no history of chemical exposure, no irritative voiding symptoms, no history of gross hematuria and no history of urologic disorder or disease then upper tract imaging should be undertaken with a CT scan and consideration of cystoscopy versus urine cytology as well. If, on the other hand the patient is at high risk then a complete evaluation should be undertaken including upper tract imaging with CT scan and cystoscopy and consideration of urine cytology as well., - 2018 Renal and ureteral calculus - 04/19/2020 BPH w/LUTS (Stable), He has known BPH. He has been taking tamsulosin 0.4 mg. - 2020 Hematospermia, I have reassured him of the fact that his hematospermia is almost certainly not due to prostate cancer or other significant pathology especially with clear urine and no pain. - 2020 Peyronies Disease, He has very mild Peyronie's disease. It is not impacting his ability to engage in intercourse. We have elected to continue to monitor this. - 2020 Urinary Urgency, He has developed some urgency and so I have suggested he try increasing his dosage of tamsulosin to 0.8 mg and if this is helpful he is going to contact me for a new prescription. - 2020 Epididymitis - 2019 Orchitis - 2019 Renal cyst, Bilateral, He was found to have a 7 cm lobulated simple cyst in the right kidney with smaller bilateral cysts noted as well. These are of no clinical significance. 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 He required ureteroscopic extraction of a stone in 2012. CT scan in 4/14 revealed a calcification in the upper pole  of the right kidney that was felt to either represent a small, nonobstructing renal calculus versus a vascular calcification.  He did undergo ureteroscopy in [place] in the past.  CT scan 5/18 - single 3 mm stone in the lower pole of the right kidney.   Renal cysts: CT scan without contrast on 08/07/12 - lesions within the right kidney. CT scan with contrast that same day which revealed the lesions within the right kidney to be simple cysts. The study also indicated there was a calcification in the upper pole of the right kidney that was felt to represent either a small, nonobstructing renal calculus versus a vascular calcification.  CT scan 5/18 - bilateral renal cysts   Elevated PSA: He was found to have  a PSA of 5.15 in 6/16 which was up from 4.41 the previous year. No family history of prostate cancer. No abnormality on DRE.  TRUS/BX 11/16/14: BPH only  PSAs:  12/12 - 3.04  12/13 - 3.44  5/15 - 4.41  6/16 - 5.15   Erectile dysfunction: In 8/16 he was started on Cialis 5 mg for erectile dysfunction.   Microscopic hematuria: He was found to have microscopic hematuria and 5/18. A CT scan revealed a single right renal calculus but no other abnormality and cystoscopy was negative.   NON-GU PMH: Encounter for general adult medical examination without abnormal findings, Encounter for preventive health examination - 2016 Personal history of other diseases of the circulatory system, History of hypertension - 2014 Personal history of other diseases of the digestive system, History of esophageal reflux - 2014    FAMILY HISTORY: Cancer - Runs In Family Carcinoma Of The Pancreas - Runs In Family Death In The Family Father - Runs In Family Death In The Family Mother - Runs In Family No pertinent family history - Other   SOCIAL HISTORY: Marital Status: Married Preferred Language: English; Ethnicity: Not Hispanic Or Latino; Race: White Current Smoking Status: Patient has never smoked.   Tobacco  Use Assessment Completed: Used Tobacco in last 30 days?     Notes: Alcohol Use, Marital History - Currently Married, Caffeine Use, Never A Smoker   REVIEW OF SYSTEMS:    GU Review Male:   Patient denies frequent urination, hard to postpone urination, burning/ pain with urination, get up at night to urinate, leakage of urine, stream starts and stops, trouble starting your stream, have to strain to urinate , erection problems, and penile pain.  Gastrointestinal (Upper):   Patient denies nausea, vomiting, and indigestion/ heartburn.  Gastrointestinal (Lower):   Patient denies diarrhea and constipation.  Constitutional:   Patient denies fever, night sweats, weight loss, and fatigue.  Skin:   Patient denies skin rash/ lesion and itching.  Eyes:   Patient denies blurred vision and double vision.  Ears/ Nose/ Throat:   Patient denies sore throat and sinus problems.  Hematologic/Lymphatic:   Patient denies swollen glands and easy bruising.  Cardiovascular:   Patient denies leg swelling and chest pains.  Respiratory:   Patient denies cough and shortness of breath.  Endocrine:   Patient denies excessive thirst.  Musculoskeletal:   Patient denies back pain and joint pain.  Neurological:   Patient denies headaches and dizziness.  Psychologic:   Patient denies depression and anxiety.   Notes: Patient having lower stomach discomfort. Patient had to have a pacemaker and Dr Caryl Comes stated he can move forward getting kidney stone removed.    VITAL SIGNS: None   MULTI-SYSTEM PHYSICAL EXAMINATION:    Constitutional: Well-nourished. No physical deformities. Normally developed. Good grooming.  Neck: Neck symmetrical, not swollen. Normal tracheal position.  Respiratory: No labored breathing, no use of accessory muscles.   Cardiovascular: Normal temperature, normal extremity pulses, no swelling, no varicosities.  Lymphatic: No enlargement of neck, axillae, groin.  Skin: No paleness, no jaundice, no cyanosis.  No lesion, no ulcer, no rash.  Neurologic / Psychiatric: Oriented to time, oriented to place, oriented to person. No depression, no anxiety, no agitation.  Gastrointestinal: It patient's abdomen is soft he has no rebound tenderness is slightly tender in the suprapubic area but no masses noted. He has no CVA tenderness.  Eyes: Normal conjunctivae. Normal eyelids.  Ears, Nose, Mouth, and Throat: Left ear no scars, no lesions, no  masses. Right ear no scars, no lesions, no masses. Nose no scars, no lesions, no masses. Normal hearing. Normal lips.  Musculoskeletal: Normal gait and station of head and neck.     Complexity of Data:   04/13/20 10/06/19 03/28/19 09/12/18 03/14/18 01/07/18 09/06/17 02/27/17  PSA  Total PSA 3.97 ng/mL 4.27 ng/mL 4.49 ng/mL 5.42 ng/mL 5.43 ng/mL 4.42 ng/mL 5.64 ng/mL 4.80 ng/mL  Free PSA  1.17 ng/mL 1.04 ng/mL 1.89 ng/mL  1.35 ng/mL 1.52 ng/mL 1.39 ng/mL  % Free PSA  27 % PSA 23 % PSA 35 % PSA  31 % PSA 27 % PSA 29 % PSA    PROCEDURES:         C.T. Urogram - [id number]      Patient confirmed No Neulasta OnPro Device.          KUB - [id number]  A single view of the abdomen is obtained.      Patient confirmed No Neulasta OnPro Device. KUB is reviewed today shows: No obvious bony abnormalities. There is a calcification region of L3 transverse process on the right which measures about 8 mm which could be some fragments Ender congealed in this area versus a solid stone. No obvious distal stones noted or stones overlying the renal contours.          Urinalysis w/Scope - 81001 Dipstick Dipstick Cont'd Micro  Color: Yellow Bilirubin: Neg mg/dL WBC/hpf: NS (Not Seen)  Appearance: Clear Ketones: Neg mg/dL RBC/hpf: 20 - 40/hpf  Specific Gravity: 1.025 Blood: 3+ ery/uL Bacteria: Few (10-25/hpf)  pH: 6.0 Protein: 2+ mg/dL Cystals: NS (Not Seen)  Glucose: Neg mg/dL Urobilinogen: 1.0 mg/dL Casts: NS (Not Seen)    Nitrites: Neg Trichomonas: Not Present    Leukocyte Esterase: Neg  leu/uL Mucous: Not Present      Epithelial Cells: NS (Not Seen)      Yeast: NS (Not Seen)      Sperm: Not Present    ASSESSMENT:      ICD-10 Details  1 GU:   Ureteral calculus - A999333 Acute, Complicated Injury   PLAN:            Medications New Meds: Percocet 5 mg-325 mg tablet 1 tablet PO Q 6 H   #20  0 Refill(s)            Orders X-Rays: C.T. Stone Protocol Without I.V. Contrast. No Oral Contrast    KUB  X-Ray Notes: History:   Hematuria: Yes/No  Patient to see MD after exam: Yes/No  Previous exam: CT / IVP/ US/ KUB/ None  When:  Where:  Diabetic: Yes/ No  BUN/ Creatine:  Date of last BUN Creatinine:  Weight in pounds:  Allergy- Contrasts/ Shellfish: Yes/ No  Conflicting diabetic meds: Yes/ No  Oral contrast and instructions given to patient:   Prior Authorization #: [id number] valid 10/28/20 thru 11/27/20            Schedule         Document Letter(s):  Created for Patient: Clinical Summary         Notes:   I have discussed treatment option for the patient.  Schedule now for cystoscopy right ureteroscopy with laser lithotripsy of right ureteral and renal calculus.  Risks include injury to bladder or ureter requiring continued JJ stent versus surgical intervention.  Patient understands may continue to need stent post procedurally for several days.  Patient understands desires to proceed.

## 2020-11-09 ENCOUNTER — Ambulatory Visit (HOSPITAL_BASED_OUTPATIENT_CLINIC_OR_DEPARTMENT_OTHER): Payer: Medicare PPO | Admitting: Certified Registered"

## 2020-11-09 ENCOUNTER — Encounter (HOSPITAL_BASED_OUTPATIENT_CLINIC_OR_DEPARTMENT_OTHER)
Admission: RE | Disposition: A | Discharge status: Home / Self Care | Payer: Self-pay | Source: Home / Self Care | Attending: Urology

## 2020-11-09 ENCOUNTER — Encounter (HOSPITAL_BASED_OUTPATIENT_CLINIC_OR_DEPARTMENT_OTHER): Payer: Self-pay | Admitting: Urology

## 2020-11-09 ENCOUNTER — Ambulatory Visit (HOSPITAL_BASED_OUTPATIENT_CLINIC_OR_DEPARTMENT_OTHER)
Admission: RE | Admit: 2020-11-09 | Discharge: 2020-11-09 | Disposition: A | Discharge status: Home / Self Care | Payer: Medicare PPO | Attending: Urology | Admitting: Urology

## 2020-11-09 DIAGNOSIS — N529 Male erectile dysfunction, unspecified: Secondary | ICD-10-CM | POA: Insufficient documentation

## 2020-11-09 DIAGNOSIS — Z79890 Hormone replacement therapy (postmenopausal): Secondary | ICD-10-CM | POA: Diagnosis not present

## 2020-11-09 DIAGNOSIS — E291 Testicular hypofunction: Secondary | ICD-10-CM | POA: Insufficient documentation

## 2020-11-09 DIAGNOSIS — Z87442 Personal history of urinary calculi: Secondary | ICD-10-CM | POA: Insufficient documentation

## 2020-11-09 DIAGNOSIS — Z7982 Long term (current) use of aspirin: Secondary | ICD-10-CM | POA: Diagnosis not present

## 2020-11-09 DIAGNOSIS — N201 Calculus of ureter: Secondary | ICD-10-CM | POA: Diagnosis not present

## 2020-11-09 DIAGNOSIS — Z950 Presence of cardiac pacemaker: Secondary | ICD-10-CM | POA: Diagnosis not present

## 2020-11-09 DIAGNOSIS — E785 Hyperlipidemia, unspecified: Secondary | ICD-10-CM | POA: Diagnosis not present

## 2020-11-09 DIAGNOSIS — N202 Calculus of kidney with calculus of ureter: Secondary | ICD-10-CM | POA: Insufficient documentation

## 2020-11-09 DIAGNOSIS — G4733 Obstructive sleep apnea (adult) (pediatric): Secondary | ICD-10-CM | POA: Diagnosis not present

## 2020-11-09 DIAGNOSIS — N200 Calculus of kidney: Secondary | ICD-10-CM | POA: Diagnosis not present

## 2020-11-09 DIAGNOSIS — Z9989 Dependence on other enabling machines and devices: Secondary | ICD-10-CM | POA: Diagnosis not present

## 2020-11-09 DIAGNOSIS — Z79899 Other long term (current) drug therapy: Secondary | ICD-10-CM | POA: Insufficient documentation

## 2020-11-09 HISTORY — PX: CYSTOSCOPY WITH RETROGRADE PYELOGRAM, URETEROSCOPY AND STENT PLACEMENT: SHX5789

## 2020-11-09 HISTORY — PX: HOLMIUM LASER APPLICATION: SHX5852

## 2020-11-09 LAB — POCT I-STAT, CHEM 8
BUN: 23 mg/dL (ref 8–23)
Calcium, Ion: 1.23 mmol/L (ref 1.15–1.40)
Chloride: 104 mmol/L (ref 98–111)
Creatinine, Ser: 1.6 mg/dL — ABNORMAL HIGH (ref 0.61–1.24)
Glucose, Bld: 149 mg/dL — ABNORMAL HIGH (ref 70–99)
HCT: 38 % — ABNORMAL LOW (ref 39.0–52.0)
Hemoglobin: 12.9 g/dL — ABNORMAL LOW (ref 13.0–17.0)
Potassium: 3.5 mmol/L (ref 3.5–5.1)
Sodium: 142 mmol/L (ref 135–145)
TCO2: 24 mmol/L (ref 22–32)

## 2020-11-09 SURGERY — CYSTOURETEROSCOPY, WITH RETROGRADE PYELOGRAM AND STENT INSERTION
Anesthesia: General | Laterality: Right

## 2020-11-09 MED ORDER — PROPOFOL 10 MG/ML IV BOLUS
INTRAVENOUS | Status: AC
  Filled 2020-11-09: qty 40

## 2020-11-09 MED ORDER — SODIUM CHLORIDE 0.9 % IV SOLN
INTRAVENOUS | Status: AC
  Filled 2020-11-09: qty 2

## 2020-11-09 MED ORDER — FENTANYL CITRATE (PF) 100 MCG/2ML IJ SOLN
INTRAMUSCULAR | Status: AC
  Filled 2020-11-09: qty 2

## 2020-11-09 MED ORDER — EPHEDRINE 5 MG/ML INJ
INTRAVENOUS | Status: AC
  Filled 2020-11-09: qty 5

## 2020-11-09 MED ORDER — ONDANSETRON HCL 4 MG/2ML IJ SOLN
INTRAMUSCULAR | Status: DC | PRN
  Administered 2020-11-09: 4 mg via INTRAVENOUS

## 2020-11-09 MED ORDER — DEXAMETHASONE SODIUM PHOSPHATE 10 MG/ML IJ SOLN
INTRAMUSCULAR | Status: AC
  Filled 2020-11-09: qty 1

## 2020-11-09 MED ORDER — PROPOFOL 10 MG/ML IV BOLUS
INTRAVENOUS | Status: DC | PRN
  Administered 2020-11-09: 200 mg via INTRAVENOUS

## 2020-11-09 MED ORDER — EPHEDRINE SULFATE-NACL 50-0.9 MG/10ML-% IV SOSY
PREFILLED_SYRINGE | INTRAVENOUS | Status: DC | PRN
  Administered 2020-11-09: 10 mg via INTRAVENOUS

## 2020-11-09 MED ORDER — KETOROLAC TROMETHAMINE 30 MG/ML IJ SOLN
INTRAMUSCULAR | Status: AC
  Filled 2020-11-09: qty 1

## 2020-11-09 MED ORDER — SODIUM CHLORIDE 0.9 % IV SOLN
2.0000 g | INTRAVENOUS | Status: AC
  Administered 2020-11-09: 2 g via INTRAVENOUS

## 2020-11-09 MED ORDER — DEXAMETHASONE SODIUM PHOSPHATE 10 MG/ML IJ SOLN
INTRAMUSCULAR | Status: DC | PRN
  Administered 2020-11-09: 5 mg via INTRAVENOUS

## 2020-11-09 MED ORDER — LIDOCAINE HCL (PF) 2 % IJ SOLN
INTRAMUSCULAR | Status: AC
  Filled 2020-11-09: qty 5

## 2020-11-09 MED ORDER — SODIUM CHLORIDE 0.9 % IR SOLN
Status: DC | PRN
  Administered 2020-11-09: 6000 mL via INTRAVESICAL

## 2020-11-09 MED ORDER — IOHEXOL 300 MG/ML  SOLN
INTRAMUSCULAR | Status: DC | PRN
  Administered 2020-11-09: 10 mL via URETHRAL

## 2020-11-09 MED ORDER — LIDOCAINE 2% (20 MG/ML) 5 ML SYRINGE
INTRAMUSCULAR | Status: DC | PRN
  Administered 2020-11-09: 60 mg via INTRAVENOUS

## 2020-11-09 MED ORDER — FENTANYL CITRATE (PF) 100 MCG/2ML IJ SOLN
INTRAMUSCULAR | Status: DC | PRN
  Administered 2020-11-09 (×3): 25 ug via INTRAVENOUS
  Administered 2020-11-09: 50 ug via INTRAVENOUS

## 2020-11-09 MED ORDER — ONDANSETRON HCL 4 MG/2ML IJ SOLN
INTRAMUSCULAR | Status: AC
  Filled 2020-11-09: qty 2

## 2020-11-09 MED ORDER — KETOROLAC TROMETHAMINE 30 MG/ML IJ SOLN
INTRAMUSCULAR | Status: DC | PRN
  Administered 2020-11-09: 30 mg via INTRAVENOUS

## 2020-11-09 MED ORDER — LACTATED RINGERS IV SOLN: INTRAVENOUS | Status: DC

## 2020-11-09 SURGICAL SUPPLY — 31 items
BAG DRAIN URO-CYSTO SKYTR STRL (DRAIN) ×2 IMPLANT
BAG DRN UROCATH (DRAIN) ×1
BASKET STONE 1.7 NGAGE (UROLOGICAL SUPPLIES) ×2 IMPLANT
BULB IRRIG PATHFIND (MISCELLANEOUS) ×2 IMPLANT
CATH URET 5FR 28IN OPEN ENDED (CATHETERS) ×2 IMPLANT
CLOTH BEACON ORANGE TIMEOUT ST (SAFETY) ×2 IMPLANT
EXTRACTOR STONE 1.7FRX115CM (UROLOGICAL SUPPLIES) IMPLANT
FIBER LASER FLEXIVA 365 (UROLOGICAL SUPPLIES) IMPLANT
GLOVE SURG ENC MOIS LTX SZ7.5 (GLOVE) ×2 IMPLANT
GLOVE SURG ENC TEXT LTX SZ7 (GLOVE) ×2 IMPLANT
GOWN STRL REUS W/ TWL LRG LVL3 (GOWN DISPOSABLE) ×1 IMPLANT
GOWN STRL REUS W/TWL LRG LVL3 (GOWN DISPOSABLE) ×2
GOWN STRL REUS W/TWL XL LVL3 (GOWN DISPOSABLE) ×2 IMPLANT
GUIDEWIRE STR DUAL SENSOR (WIRE) ×2 IMPLANT
GUIDEWIRE ZIPWRE .038 STRAIGHT (WIRE) IMPLANT
IV NS 1000ML (IV SOLUTION)
IV NS 1000ML BAXH (IV SOLUTION) IMPLANT
IV NS IRRIG 3000ML ARTHROMATIC (IV SOLUTION) ×4 IMPLANT
KIT TURNOVER CYSTO (KITS) ×2 IMPLANT
MANIFOLD NEPTUNE II (INSTRUMENTS) ×2 IMPLANT
NS IRRIG 500ML POUR BTL (IV SOLUTION) ×2 IMPLANT
PACK CYSTO (CUSTOM PROCEDURE TRAY) ×2 IMPLANT
SHEATH URETERAL 12FRX35CM (MISCELLANEOUS) ×2 IMPLANT
STENT URET 6FRX26 CONTOUR (STENTS) IMPLANT
STENT URET 6FRX28 CONTOUR (STENTS) ×2 IMPLANT
SYR 10ML LL (SYRINGE) ×2 IMPLANT
SYR 20ML LL LF (SYRINGE) ×2 IMPLANT
TRACTIP FLEXIVA PULS ID 200XHI (Laser) ×1 IMPLANT
TRACTIP FLEXIVA PULSE ID 200 (Laser) ×2
TUBE CONNECTING 12X1/4 (SUCTIONS) IMPLANT
TUBING UROLOGY SET (TUBING) ×2 IMPLANT

## 2020-11-09 NOTE — Interval H&P Note (Signed)
History and Physical Interval Note:  11/09/2020 8:30 AM  Grant Bowers  has presented today for surgery, with the diagnosis of RIGHT URETERAL STONE.  The various methods of treatment have been discussed with the patient and family. After consideration of risks, benefits and other options for treatment, the patient has consented to  Procedure(s) with comments: CYSTOSCOPY WITH POSSIBLE RETROGRADE PYELOGRAM, URETEROSCOPY AND POSSIBLE STENT REPLACEMENT (Right) - 30 MINS HOLMIUM LASER APPLICATION (Right) as a surgical intervention.  The patient's history has been reviewed, patient examined, no change in status, stable for surgery.  I have reviewed the patient's chart and labs.  Questions were answered to the patient's satisfaction.     Remi Haggard

## 2020-11-09 NOTE — Discharge Instructions (Addendum)
  Post Anesthesia Home Care Instructions  Activity: Get plenty of rest for the remainder of the day. A responsible individual must stay with you for 24 hours following the procedure.  For the next 24 hours, DO NOT: -Drive a car -Paediatric nurse -Drink alcoholic beverages -Take any medication unless instructed by your physician -Make any legal decisions or sign important papers.  Meals: Start with liquid foods such as gelatin or soup. Progress to regular foods as tolerated. Avoid greasy, spicy, heavy foods. If nausea and/or vomiting occur, drink only clear liquids until the nausea and/or vomiting subsides. Call your physician if vomiting continues.  Special Instructions/Symptoms: Your throat may feel dry or sore from the anesthesia or the breathing tube placed in your throat during surgery. If this causes discomfort, gargle with warm salt water. The discomfort should disappear within 24 hours.  No ibuprofen, Advil, Aleve, Motrin, ketorolac, meloxicam, or naproxen until after 5 pm today if needed.

## 2020-11-09 NOTE — Anesthesia Postprocedure Evaluation (Signed)
Anesthesia Post Note  Patient: Grant Bowers  Procedure(s) Performed: CYSTOSCOPY WITH RIGHT  RETROGRADE PYELOGRAM, URETEROSCOPY AND STENT EXHANGE (Right) HOLMIUM LASER APPLICATION (Right)     Patient location during evaluation: PACU Anesthesia Type: General Level of consciousness: awake Pain management: pain level controlled Vital Signs Assessment: post-procedure vital signs reviewed and stable Respiratory status: spontaneous breathing and respiratory function stable Cardiovascular status: stable Postop Assessment: no apparent nausea or vomiting Anesthetic complications: no   No notable events documented.  Last Vitals:  Vitals:   11/09/20 1145 11/09/20 1155  BP: (!) 141/61 (!) 164/72  Pulse: 71 84  Resp: 11 16  Temp:  (!) 36.4 C  SpO2: 95% 97%    Last Pain:  Vitals:   11/09/20 1155  PainSc: 0-No pain                 Candra R Jaycelynn Knickerbocker

## 2020-11-09 NOTE — Transfer of Care (Signed)
Immediate Anesthesia Transfer of Care Note  Patient: Grant Bowers  Procedure(s) Performed: Procedure(s) (LRB): CYSTOSCOPY WITH RIGHT  RETROGRADE PYELOGRAM, URETEROSCOPY AND STENT EXHANGE (Right) HOLMIUM LASER APPLICATION (Right)  Patient Location: PACU  Anesthesia Type: General  Level of Consciousness: awake, oriented, sedated and patient cooperative  Airway & Oxygen Therapy: Patient Spontanous Breathing and Patient connected to face mask oxygen  Post-op Assessment: Report given to PACU RN and Post -op Vital signs reviewed and stable  Post vital signs: Reviewed and stable  Complications: No apparent anesthesia complications  Last Vitals:  Vitals Value Taken Time  BP 163/69 11/09/20 1109  Temp    Pulse 86 11/09/20 1110  Resp 13 11/09/20 1110  SpO2 100 % 11/09/20 1110  Vitals shown include unvalidated device data.  Last Pain:  Vitals:   11/09/20 0702  PainSc: 0-No pain      Patients Stated Pain Goal: 7 ([id number])  Complications: No notable events documented.

## 2020-11-09 NOTE — Anesthesia Procedure Notes (Signed)
Procedure Name: LMA Insertion Date/Time: 11/09/2020 9:23 AM Performed by: Suan Halter, CRNA Pre-anesthesia Checklist: Patient identified, Emergency Drugs available, Suction available and Patient being monitored Patient Re-evaluated:Patient Re-evaluated prior to induction Oxygen Delivery Method: Circle system utilized Preoxygenation: Pre-oxygenation with 100% oxygen Induction Type: IV induction Ventilation: Mask ventilation without difficulty LMA: LMA inserted LMA Size: 4.0 Number of attempts: 1 Airway Equipment and Method: Bite block Placement Confirmation: positive ETCO2 Tube secured with: Tape Dental Injury: Teeth and Oropharynx as per pre-operative assessment

## 2020-11-09 NOTE — Anesthesia Preprocedure Evaluation (Signed)
Anesthesia Evaluation  Patient identified by MRN, date of birth, ID band Patient awake    Reviewed: Allergy & Precautions, NPO status , Patient's Chart, lab work & pertinent test results  Airway Mallampati: II  TM Distance: >3 FB Neck ROM: Full    Dental no notable dental hx. (+) Teeth Intact, Dental Advisory Given, Caps   Pulmonary sleep apnea and Continuous Positive Airway Pressure Ventilation ,    Pulmonary exam normal breath sounds clear to auscultation       Cardiovascular hypertension, Pt. on medications negative cardio ROS Normal cardiovascular exam+ pacemaker  Rhythm:Regular Rate:Normal  Echo: 1. Left ventricular ejection fraction, by estimation, is 65 to 70%. The  left ventricle has normal function. The left ventricle has no regional  wall motion abnormalities. There is mild left ventricular hypertrophy.  Left ventricular diastolic parameters  are consistent with Grade I diastolic dysfunction (impaired relaxation).  The average left ventricular global longitudinal strain is 24.0 %. The  global longitudinal strain is normal.  2. Right ventricular systolic function is normal. The right ventricular  size is normal.  3. Left atrial size was moderately dilated.  4. The pericardial effusion is circumferential.  5. The mitral valve is normal in structure. Mild mitral valve  regurgitation. No evidence of mitral stenosis.  6. The aortic valve is tricuspid. Aortic valve regurgitation is not  visualized. No aortic stenosis is present.  7. The inferior vena cava is normal in size with greater than 50%  respiratory variability, suggesting right atrial pressure of 3 mmHg.    Neuro/Psych negative neurological ROS  negative psych ROS   GI/Hepatic Neg liver ROS, GERD  Medicated,  Endo/Other  negative endocrine ROS  Renal/GU Renal disease  negative genitourinary   Musculoskeletal negative musculoskeletal ROS (+)    Abdominal   Peds negative pediatric ROS (+)  Hematology negative hematology ROS (+)   Anesthesia Other Findings   Reproductive/Obstetrics negative OB ROS                             Anesthesia Physical  Anesthesia Plan  ASA: 3  Anesthesia Plan: General   Post-op Pain Management:    Induction: Intravenous  PONV Risk Score and Plan: 3 and Ondansetron, Dexamethasone and Treatment may vary due to age or medical condition  Airway Management Planned: LMA  Additional Equipment: None  Intra-op Plan:   Post-operative Plan: Extubation in OR  Informed Consent: I have reviewed the patients History and Physical, chart, labs and discussed the procedure including the risks, benefits and alternatives for the proposed anesthesia with the patient or authorized representative who has indicated his/her understanding and acceptance.     Dental advisory given  Plan Discussed with: CRNA and Anesthesiologist  Anesthesia Plan Comments:         Anesthesia Quick Evaluation

## 2020-11-09 NOTE — Op Note (Signed)
Preoperative diagnosis:  1.  Right renal and ureteral calculi  Postoperative diagnosis: 1.  Same  Procedure(s): 1.  Cystoscopy, right retrograde pyelogram with intraoperative interpretation, right ureteroscopy and pyeloscopy with laser lithotripsy of ureteral and renal calculi with stone extraction, right JJ stent exchange  Surgeon: Dr. Harold Barban  Anesthesia: General  Complications: None  EBL: Minimal  Specimens: Kidney stones  Disposition of specimens: With patient  Intraoperative findings: Patient had 3-4 6 mm stones in the right mid to distal ureter just beneath the crossing of the iliac vessels.  They required fragmentation and extraction.  Proximal ureteral calculi also noted at the UPJ that were probably 6 to 7 mm in size.  These were also fragmented and extracted.  Pyeloscopy performed revealed no remaining stones in the kidney.  6 [place] by 28 cm Percuflex plus soft contour stent placed  Indication: Patient is a 75 year old white male underwent lithotripsy of large right renal calculus.  Has had pain and what appears to be several fragments lodged in the right mid ureter and also at the UPJ area on the right.  He is undergone cystoscopy and insertion of right JJ stent 1 week ago.  Presents at this time to undergo cystoscopy right ureteroscopy and pyeloscopy with laser lithotripsy and right JJ stent exchange  Description of procedure:  After obtaining informed consent for the patient is taken the major cystoscopy suite placed under general anesthesia.  Placed in dorsolithotomy position genitalia prepped and draped in usual sterile fashion.  Proper pause and timeout was performed for site of procedure.  11 Fransico was advanced in the bladder without difficulty.  Patient was found to have trilobar prostatic hypertrophy with slightly elevated median lobe.  The right JJ stent was identified and was pulled to the end of the urethral meatus utilizing alligator graspers.  A sensor  wire was then fed up through the stent and coiled in the right renal pelvis under fluoroscopy.  The right JJ stent was removed leaving the guidewire in place.  The 6.4 French semirigid ureteroscope was then advanced over the guidewire into the bladder.  This was easily advanced inside the right ureter and in the right mid distal ureter there was a line of calculi which were somewhat impacted with some edema around them.  Some of the smaller fragments were extracted utilizing an engage basket.  Larger fragments were subsequently treated utilizing the holmium laser fiber 200 m at a power of 8 and 0.8.  The fragments were subsequently extracted with several passes of the ureteroscope.  I then advanced the ureteroscope along its length and encountered more stones in the proximal ureter.  I could not reach them with the semirigid scope therefore guidewire was placed wire was left in place and the ureteroscope removed.  A 12-14 French ureteral access sheath was then passed over the guidewire first with the obturator and then with the obturator and sheath with minimal resistance.  Obturator and wire were removed.  The digital flexible scope was then advanced through the access sheath up to the level of the proximal ureteral calculi.  Laser fiber was utilized to fragment the larger stone into smaller fragments and these were subsequently basket extracted again utilizing the engage basket.  Scope was advanced into the kidney and there was some smoother stones noted which were basket extracted with the engage basket.  Final look in the kidney revealed no remaining stone fragments.  The ureteroscope was then backed down as the access sheath was removed and  the ureter visualized along its length with no remaining fragments noted.  A sensor wire was then passed back up to the right kidney through the cystoscope.  A 6 French by 28 cm Percuflex plus soft Contour stent was placed leaving a proximal coil in the upper pole calyx and  a distal coil in the bladder.  There was flow of urine through and around the stent noted.  Bladder was emptied and a nylon suture was left on the distal end of the stent left protruding to the urethral meatus to facilitate future removal.  He tolerated procedure well was awakened from anesthesia and talked taken back to the recovery in stable condition no immediate complication from the procedure.

## 2020-11-10 ENCOUNTER — Encounter (HOSPITAL_BASED_OUTPATIENT_CLINIC_OR_DEPARTMENT_OTHER): Payer: Self-pay | Admitting: Urology

## 2020-11-11 ENCOUNTER — Ambulatory Visit: Payer: Medicare PPO | Admitting: Internal Medicine

## 2020-11-16 ENCOUNTER — Other Ambulatory Visit: Payer: Self-pay

## 2020-11-16 ENCOUNTER — Encounter: Payer: Self-pay | Admitting: Internal Medicine

## 2020-11-16 ENCOUNTER — Other Ambulatory Visit: Payer: Self-pay | Admitting: Internal Medicine

## 2020-11-16 ENCOUNTER — Ambulatory Visit (INDEPENDENT_AMBULATORY_CARE_PROVIDER_SITE_OTHER): Payer: Medicare PPO | Admitting: Internal Medicine

## 2020-11-16 VITALS — BP 136/70 | HR 92 | Temp 98.0°F | Ht 72.0 in | Wt 213.0 lb

## 2020-11-16 DIAGNOSIS — I10 Essential (primary) hypertension: Secondary | ICD-10-CM | POA: Diagnosis not present

## 2020-11-16 DIAGNOSIS — E559 Vitamin D deficiency, unspecified: Secondary | ICD-10-CM | POA: Diagnosis not present

## 2020-11-16 DIAGNOSIS — E7800 Pure hypercholesterolemia, unspecified: Secondary | ICD-10-CM

## 2020-11-16 DIAGNOSIS — R739 Hyperglycemia, unspecified: Secondary | ICD-10-CM | POA: Diagnosis not present

## 2020-11-16 LAB — TSH: TSH: 1.68 u[IU]/mL (ref 0.35–5.50)

## 2020-11-16 LAB — BASIC METABOLIC PANEL
BUN: 25 mg/dL — ABNORMAL HIGH (ref 6–23)
CO2: 28 mEq/L (ref 19–32)
Calcium: 9.4 mg/dL (ref 8.4–10.5)
Chloride: 101 mEq/L (ref 96–112)
Creatinine, Ser: 1.49 mg/dL (ref 0.40–1.50)
GFR: 45.52 mL/min — ABNORMAL LOW (ref >60.00)
Glucose, Bld: 143 mg/dL — ABNORMAL HIGH (ref 70–99)
Potassium: 3.6 mEq/L (ref 3.5–5.1)
Sodium: 138 mEq/L (ref 135–145)

## 2020-11-16 LAB — CBC WITH DIFFERENTIAL/PLATELET
Basophils Absolute: 0 10*3/uL (ref 0.0–0.1)
Basophils Relative: 0.5 % (ref 0.0–3.0)
Eosinophils Absolute: 0.2 10*3/uL (ref 0.0–0.7)
Eosinophils Relative: 2.7 % (ref 0.0–5.0)
HCT: 39.2 % (ref 39.0–52.0)
Hemoglobin: 13.6 g/dL (ref 13.0–17.0)
Lymphocytes Relative: 23.9 % (ref 12.0–46.0)
Lymphs Abs: 1.7 10*3/uL (ref 0.7–4.0)
MCHC: 34.6 g/dL (ref 30.0–36.0)
MCV: 84.8 fl (ref 78.0–100.0)
Monocytes Absolute: 0.7 10*3/uL (ref 0.1–1.0)
Monocytes Relative: 10 % (ref 3.0–12.0)
Neutro Abs: 4.5 10*3/uL (ref 1.4–7.7)
Neutrophils Relative %: 62.9 % (ref 43.0–77.0)
Platelets: 264 10*3/uL (ref 150.0–400.0)
RBC: 4.62 Mil/uL (ref 4.22–5.81)
RDW: 13.3 % (ref 11.5–15.5)
WBC: 7.2 10*3/uL (ref 4.0–10.5)

## 2020-11-16 LAB — LIPID PANEL
Cholesterol: 130 mg/dL (ref 0–200)
HDL: 42.8 mg/dL (ref >39.00)
LDL Cholesterol: 69 mg/dL (ref 0–99)
NonHDL: 86.93
Total CHOL/HDL Ratio: 3
Triglycerides: 91 mg/dL (ref 0.0–149.0)
VLDL: 18.2 mg/dL (ref 0.0–40.0)

## 2020-11-16 LAB — HEPATIC FUNCTION PANEL
ALT: 15 U/L (ref 0–53)
AST: 18 U/L (ref 0–37)
Albumin: 4.3 g/dL (ref 3.5–5.2)
Alkaline Phosphatase: 82 U/L (ref 39–117)
Bilirubin, Direct: 0.1 mg/dL (ref 0.0–0.3)
Total Bilirubin: 0.8 mg/dL (ref 0.2–1.2)
Total Protein: 7.1 g/dL (ref 6.0–8.3)

## 2020-11-16 LAB — HEMOGLOBIN A1C: Hgb A1c MFr Bld: 7.1 % — ABNORMAL HIGH (ref 4.6–6.5)

## 2020-11-16 LAB — VITAMIN D 25 HYDROXY (VIT D DEFICIENCY, FRACTURES): VITD: 38.56 ng/mL (ref 30.00–100.00)

## 2020-11-16 MED ORDER — METFORMIN HCL ER 500 MG PO TB24: 500.0000 mg | ORAL_TABLET | Freq: Every day | ORAL | 3 refills | Status: DC

## 2020-11-16 NOTE — Patient Instructions (Addendum)
Please check with your insurance about the Shingrix shingles shot  Please take OTC Vitamin D3 at 2000 units per day, indefinitely  Please continue all other medications as before, and refills have been done if requested.  Please have the pharmacy call with any other refills you may need.  Please continue your efforts at being more active, low cholesterol diet, and weight control.  You are otherwise up to date with prevention measures today.  Please keep your appointments with your specialists as you may have planned  Please go to the LAB at the blood drawing area for the tests to be done  You will be contacted by phone if any changes need to be made immediately.  Otherwise, you will receive a letter about your results with an explanation, but please check with MyChart first.  Please remember to sign up for MyChart if you have not done so, as this will be important to you in the future with finding out test results, communicating by private email, and scheduling acute appointments online when needed.  Please make an Appointment to return in 6 months, or sooner if needed,

## 2020-11-16 NOTE — Assessment & Plan Note (Signed)
Last vitamin D Lab Results  Component Value Date   VD25OH 30.68 06/01/2020   Low, to start oral replacement

## 2020-11-16 NOTE — Progress Notes (Signed)
Patient ID: Grant Bowers, male   DOB: 1945/09/02, 75 y.o.   MRN: [id number]        Chief Complaint: follow up elev psa, hyperglycemia, ckd, low vit d       HPI:  Grant Bowers is a 75 y.o. male here overall doing well, but unfortunately recently required PM and also s/p covid infection.  Pt denies chest pain, increased sob or doe, wheezing, orthopnea, PND, increased LE swelling, palpitations, dizziness or syncope, except Still remains mild sob, possibly related to prior covid.  Now s/p 3 renal stone procedures as well.  Not taking vit d.   Pt denies polydipsia, polyuria, or new focal neuro s/s.  Has overall gained 10 lbs in last 3 yrs.  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   [place];   followed by [facility]--- dr Caryl Comes   ED (erectile dysfunction)    GERD (gastroesophageal reflux disease)    History of 2019 novel coronavirus disease (COVID-19) 05/07/2020   per pt mild symptoms all resolved with exception DOE   History of cardiac murmur as a child    History of kidney stones    Hyperlipidemia    Hypertension    followed by pcp and cardiology   Hypogonadism male    Mild atherosclerosis of both carotid arteries 09/23/2015   duplex doppler in epic bilateral ICA 1-39%   OSA on CPAP    per pt uses nightly   Post-COVID chronic dyspnea    had covid 05-07-2020;  per pt never had issue prior to covid ;  PFT 10-19-2020 normal per dr c. young   Right ureteral calculus 02/11/2010   Trifascicular block    alternating with BBB per dr Caryl Comes note;  s/p  PPM 07-09-2020;   echo 07-29-2020 mild LVH, G1DD, ef 65-70%, moderate LAE, mild MR without stenosis   Past Surgical History:  Procedure Laterality Date   CARDIAC CATHETERIZATION  2009   [place] in [place] , told was normal   CARDIAC CATHETERIZATION  02/17/2010   '@[facility]'$   by dr Angelena Form--- no angiographic evidence cad, normal lvsf,  false positive stress test   COLONOSCOPY  12/07/2006   [facility] in [place], URETEROSCOPY AND STENT PLACEMENT Right 10/29/2020   Procedure: CYSTOSCOPY WITH RETROGRADE PYELOGRAM, URETEROSCOPY AND STENT PLACEMENT;  Surgeon: Franchot Gallo, MD;  Location: [facility];  Service: Urology;  Laterality: Right;   CYSTOSCOPY WITH RETROGRADE PYELOGRAM, URETEROSCOPY AND STENT PLACEMENT Right 11/09/2020   Procedure: CYSTOSCOPY WITH RIGHT  RETROGRADE PYELOGRAM, URETEROSCOPY AND STENT EXHANGE;  Surgeon: Remi Haggard, MD;  Location: [facility];  Service: Urology;  Laterality: Right;  30 MINS   CYSTOSCOPY WITH URETEROSCOPY, STONE BASKETRY AND STENT PLACEMENT  2012   EXTRACORPOREAL SHOCK WAVE LITHOTRIPSY Right 04/29/2020   Procedure: EXTRACORPOREAL SHOCK WAVE LITHOTRIPSY (ESWL);  Surgeon: Robley Fries, MD;  Location: [facility];  Service: Urology;  Laterality: Right;  HOLMIUM LASER APPLICATION Right [id number]   Procedure: HOLMIUM LASER APPLICATION;  Surgeon: Remi Haggard, MD;  Location: [facility];  Service: Urology;  Laterality: Right;   KNEE ARTHROSCOPY Left 2008   PACEMAKER IMPLANT N/A 07/09/2020   Procedure: PACEMAKER IMPLANT;  Surgeon: Deboraha Sprang, MD;  Location: [facility] [place] [facility];  Service: Cardiovascular;  Laterality: N/A;    reports that he has never smoked. He has never used smokeless tobacco. He reports current alcohol use. He reports that he does not use drugs. family history includes Colon polyps in his brother; Pancreatic cancer in his mother. No Known  Allergies Current Outpatient Medications on File Prior to Visit  Medication Sig Dispense Refill   amLODipine (NORVASC) 10 MG tablet TAKE 1 TABLET EVERY DAY 90 tablet 1   cetirizine (ZYRTEC) 10 MG tablet TAKE 1 TABLET EVERY DAY 90 tablet 1   fluticasone (FLONASE) 50 MCG/ACT nasal spray USE 2 SPRAYS IN EACH NOSTRIL EVERY DAY 48 g 3   hydrochlorothiazide (HYDRODIURIL) 25 MG tablet Take 1 tablet (25 mg total) by mouth daily. 90 tablet 3   irbesartan (AVAPRO) 300 MG tablet TAKE 1 TABLET AT BEDTIME 90 tablet 1   Multiple Vitamins-Minerals (CENTRUM ADULTS) TABS Take 1 tablet by mouth daily.     omeprazole (PRILOSEC) 10 MG capsule TAKE 1 CAPSULE EVERY DAY 90 capsule 1   rosuvastatin (CRESTOR) 20 MG tablet TAKE 1 TABLET EVERY DAY ( ANNUAL APPOINTMENT DUE IN JULY MUST SEE PROVIDER FOR FURTHER REFILLS ) 90 tablet 0   sildenafil (REVATIO) 20 MG tablet 3-5 tabs by mouth every other day as needed 60 tablet 2   tamsulosin (FLOMAX) 0.4 MG CAPS capsule Take 1 capsule (0.4 mg total) by mouth daily. 90 capsule 3   No current facility-administered medications on file prior to visit.        ROS:  All others reviewed and negative.  Objective        PE:  BP 136/70 (BP Location: Left Arm, Patient Position: Sitting, Cuff Size: Normal)   Pulse 92   Temp 98 F (36.7 C) (Oral)   Ht 6' (1.829 m)   Wt 213 lb (96.6 kg)   SpO2 97%   BMI 28.89 kg/m                 Constitutional: Pt appears in NAD               HENT: Head: NCAT.                Right Ear: External ear normal.                 Left Ear: External ear normal.                Eyes: . Pupils are equal, round, and reactive to light. Conjunctivae and EOM are normal               Nose: without d/c or deformity               Neck: Neck supple. Gross normal ROM               Cardiovascular: Normal rate and regular rhythm.                 Pulmonary/Chest: Effort normal and breath sounds without rales or wheezing.                Abd:  Soft, NT, ND, + BS,  no  organomegaly               Neurological: Pt is alert. At baseline orientation, motor grossly intact               Skin: Skin is warm. No rashes, no other new lesions, LE edema - none               Psychiatric: Pt behavior is normal without agitation   Micro: none  Cardiac tracings I have personally interpreted today:  none  Pertinent Radiological findings (summarize): none   Lab Results  Component Value Date   WBC 7.2 11/16/2020   HGB 13.6 11/16/2020   HCT 39.2 11/16/2020   PLT 264.0 11/16/2020   GLUCOSE 143 (H) 11/16/2020   CHOL 130 11/16/2020   TRIG 91.0 11/16/2020   HDL 42.80 11/16/2020   LDLDIRECT 150.3 02/11/2010   LDLCALC 69 11/16/2020   ALT 15 11/16/2020   AST 18 11/16/2020   NA 138 11/16/2020   K 3.6 11/16/2020   CL 101 11/16/2020   CREATININE 1.49 11/16/2020   BUN 25 (H) 11/16/2020   CO2 28 11/16/2020   TSH 1.68 11/16/2020   PSA 4.80 (H) 06/01/2020   INR 1.2 ratio (H) 02/15/2010   HGBA1C 7.1 (H) 11/16/2020   Assessment/Plan:  Grant Bowers is a 75 y.o. White or Caucasian [1] male with  has a past medical history of BPH with elevated PSA, Cardiac pacemaker in situ (07/09/2020), ED (erectile dysfunction), GERD (gastroesophageal reflux disease), History of 2019 novel coronavirus disease (COVID-19) (05/07/2020), History of cardiac murmur as a child, History of kidney stones, Hyperlipidemia, Hypertension, Hypogonadism male, Mild atherosclerosis of both carotid arteries (09/23/2015), OSA on CPAP, Post-COVID chronic dyspnea, Right ureteral calculus (02/11/2010), and Trifascicular block.  Vitamin D deficiency Last vitamin D Lab Results  Component Value Date   VD25OH 30.68 06/01/2020   Low, to start oral replacement   Hypertension, uncontrolled BP Readings from Last 3 Encounters:  11/16/20 136/70  11/09/20 (!) 164/72  10/29/20 (!) 166/74   improved, stable, pt to continue medical treatment norvasc, hct, avapro   Hyperlipidemia Lab Results  Component Value  Date   LDLCALC 69 11/16/2020   Stable, pt to continue current statin crestor   Hyperglycemia Lab Results  Component Value Date   HGBA1C 7.1 (H) 11/16/2020   uncontrolled, pt to start metformin ER 500 qd  Followup: Return in about 6 months (around 05/19/2021).  Cathlean Cower, MD 11/20/2020 3:08 PM [facility]

## 2020-11-18 DIAGNOSIS — N201 Calculus of ureter: Secondary | ICD-10-CM | POA: Diagnosis not present

## 2020-11-18 DIAGNOSIS — N202 Calculus of kidney with calculus of ureter: Secondary | ICD-10-CM | POA: Diagnosis not present

## 2020-11-20 ENCOUNTER — Encounter: Payer: Self-pay | Admitting: Internal Medicine

## 2020-11-20 NOTE — Assessment & Plan Note (Signed)
Lab Results  Component Value Date   HGBA1C 7.1 (H) 11/16/2020   uncontrolled, pt to start metformin ER 500 qd

## 2020-11-20 NOTE — Assessment & Plan Note (Signed)
BP Readings from Last 3 Encounters:  11/16/20 136/70  11/09/20 (!) 164/72  10/29/20 (!) 166/74   improved, stable, pt to continue medical treatment norvasc, hct, avapro

## 2020-11-20 NOTE — Assessment & Plan Note (Signed)
Lab Results  Component Value Date   LDLCALC 69 11/16/2020   Stable, pt to continue current statin crestor

## 2020-12-17 ENCOUNTER — Other Ambulatory Visit: Payer: Self-pay

## 2020-12-24 DIAGNOSIS — G4731 Primary central sleep apnea: Secondary | ICD-10-CM | POA: Diagnosis not present

## 2020-12-28 NOTE — Addendum Note (Signed)
Addended by: Terence Lux A on: 12/28/2020 09:02 AM   Modules accepted: Orders

## 2020-12-28 NOTE — Telephone Encounter (Signed)
Please advise on prescriptions for monitor, test strips, and lancets

## 2020-12-28 NOTE — Telephone Encounter (Signed)
Please refill as per office routine med refill policy (all routine meds to be refilled for 3 mo or monthly (per pt preference) up to one year from last visit, then month to month grace period for 3 mo, then further med refills will have to be denied) ? ?

## 2020-12-29 MED ORDER — TRUEPLUS LANCETS 33G MISC: 12 refills | Status: DC

## 2020-12-29 MED ORDER — TRUE METRIX LEVEL 1 LOW VI SOLN: 1 refills | Status: DC

## 2020-12-29 MED ORDER — BD SWAB SINGLE USE REGULAR PADS: MEDICATED_PAD | 12 refills | Status: DC

## 2020-12-29 MED ORDER — TRUE METRIX BLOOD GLUCOSE TEST VI STRP: ORAL_STRIP | 12 refills | Status: DC

## 2020-12-29 MED ORDER — METFORMIN HCL ER 500 MG PO TB24: 500.0000 mg | ORAL_TABLET | Freq: Every day | ORAL | 3 refills | Status: DC

## 2020-12-29 NOTE — Addendum Note (Signed)
Addended by: Biagio Borg on: 12/29/2020 05:39 PM   Modules accepted: Orders

## 2020-12-30 ENCOUNTER — Telehealth: Payer: Self-pay | Admitting: Internal Medicine

## 2020-12-30 DIAGNOSIS — E1165 Type 2 diabetes mellitus with hyperglycemia: Secondary | ICD-10-CM

## 2020-12-30 NOTE — Telephone Encounter (Signed)
Ok done

## 2020-12-30 NOTE — Telephone Encounter (Signed)
   Patient requesting referral for [facility]

## 2021-01-11 ENCOUNTER — Ambulatory Visit (INDEPENDENT_AMBULATORY_CARE_PROVIDER_SITE_OTHER): Payer: Medicare PPO

## 2021-01-11 DIAGNOSIS — I441 Atrioventricular block, second degree: Secondary | ICD-10-CM

## 2021-01-12 ENCOUNTER — Telehealth: Payer: Self-pay

## 2021-01-12 NOTE — Telephone Encounter (Signed)
The patient called to get help sending a transmission. I gave him verbal instructions because his monitor is on a landline.

## 2021-01-14 LAB — CUP PACEART REMOTE DEVICE CHECK
Battery Remaining Longevity: 94 mo
Battery Remaining Percentage: 95.5 %
Battery Voltage: 2.99 V
Brady Statistic AP VP Percent: 29 %
Brady Statistic AP VS Percent: 1 %
Brady Statistic AS VP Percent: 69 %
Brady Statistic AS VS Percent: 1 %
Brady Statistic RA Percent Paced: 28 %
Brady Statistic RV Percent Paced: 98 %
Date Time Interrogation Session: 20221004020014
Implantable Lead Implant Date: 20220401
Implantable Lead Implant Date: 20220401
Implantable Lead Location: 753859
Implantable Lead Location: 753860
Implantable Lead Model: 3830
Implantable Pulse Generator Implant Date: 20220401
Lead Channel Impedance Value: 490 Ohm
Lead Channel Impedance Value: 530 Ohm
Lead Channel Pacing Threshold Amplitude: 0.25 V
Lead Channel Pacing Threshold Amplitude: 1 V
Lead Channel Pacing Threshold Pulse Width: 0.4 ms
Lead Channel Pacing Threshold Pulse Width: 0.4 ms
Lead Channel Sensing Intrinsic Amplitude: 10.8 mV
Lead Channel Sensing Intrinsic Amplitude: 3.1 mV
Lead Channel Setting Pacing Amplitude: 1.25 V
Lead Channel Setting Pacing Amplitude: 3.5 V
Lead Channel Setting Pacing Pulse Width: 0.4 ms
Lead Channel Setting Sensing Sensitivity: 2 mV
Pulse Gen Model: 2272
Pulse Gen Serial Number: 3912376

## 2021-01-19 NOTE — Progress Notes (Signed)
Remote pacemaker transmission.   

## 2021-02-15 DIAGNOSIS — D224 Melanocytic nevi of scalp and neck: Secondary | ICD-10-CM | POA: Diagnosis not present

## 2021-02-15 DIAGNOSIS — L718 Other rosacea: Secondary | ICD-10-CM | POA: Diagnosis not present

## 2021-02-15 DIAGNOSIS — D2271 Melanocytic nevi of right lower limb, including hip: Secondary | ICD-10-CM | POA: Diagnosis not present

## 2021-02-15 DIAGNOSIS — D225 Melanocytic nevi of trunk: Secondary | ICD-10-CM | POA: Diagnosis not present

## 2021-02-15 DIAGNOSIS — Z1283 Encounter for screening for malignant neoplasm of skin: Secondary | ICD-10-CM | POA: Diagnosis not present

## 2021-02-15 DIAGNOSIS — L814 Other melanin hyperpigmentation: Secondary | ICD-10-CM | POA: Diagnosis not present

## 2021-02-15 DIAGNOSIS — L853 Xerosis cutis: Secondary | ICD-10-CM | POA: Diagnosis not present

## 2021-02-22 DIAGNOSIS — M79645 Pain in left finger(s): Secondary | ICD-10-CM | POA: Diagnosis not present

## 2021-02-22 DIAGNOSIS — M65312 Trigger thumb, left thumb: Secondary | ICD-10-CM | POA: Diagnosis not present

## 2021-03-01 DIAGNOSIS — G4731 Primary central sleep apnea: Secondary | ICD-10-CM | POA: Diagnosis not present

## 2021-03-01 DIAGNOSIS — I10 Essential (primary) hypertension: Secondary | ICD-10-CM | POA: Diagnosis not present

## 2021-03-01 DIAGNOSIS — Z950 Presence of cardiac pacemaker: Secondary | ICD-10-CM | POA: Diagnosis not present

## 2021-03-15 ENCOUNTER — Other Ambulatory Visit: Payer: Self-pay

## 2021-03-15 ENCOUNTER — Encounter: Payer: Self-pay | Admitting: Endocrinology

## 2021-03-15 ENCOUNTER — Ambulatory Visit (INDEPENDENT_AMBULATORY_CARE_PROVIDER_SITE_OTHER): Payer: Medicare PPO | Admitting: Endocrinology

## 2021-03-15 VITALS — BP 136/62 | HR 76 | Ht 72.0 in | Wt 203.2 lb

## 2021-03-15 DIAGNOSIS — E1165 Type 2 diabetes mellitus with hyperglycemia: Secondary | ICD-10-CM

## 2021-03-15 DIAGNOSIS — E119 Type 2 diabetes mellitus without complications: Secondary | ICD-10-CM

## 2021-03-15 LAB — BASIC METABOLIC PANEL
BUN: 27 mg/dL — ABNORMAL HIGH (ref 6–23)
CO2: 27 mEq/L (ref 19–32)
Calcium: 9.6 mg/dL (ref 8.4–10.5)
Chloride: 104 mEq/L (ref 96–112)
Creatinine, Ser: 1.51 mg/dL — ABNORMAL HIGH (ref 0.40–1.50)
GFR: 44.7 mL/min — ABNORMAL LOW (ref >60.00)
Glucose, Bld: 109 mg/dL — ABNORMAL HIGH (ref 70–99)
Potassium: 3.8 mEq/L (ref 3.5–5.1)
Sodium: 140 mEq/L (ref 135–145)

## 2021-03-15 LAB — POCT GLYCOSYLATED HEMOGLOBIN (HGB A1C): Hemoglobin A1C: 6.7 % — AB (ref 4.0–5.6)

## 2021-03-15 MED ORDER — RYBELSUS 3 MG PO TABS: 3.0000 mg | ORAL_TABLET | Freq: Every day | ORAL | 3 refills | Status: DC

## 2021-03-15 NOTE — Patient Instructions (Addendum)
good diet and exercise significantly improve the control of your diabetes.  please let me know if you wish to be referred to a dietician.  high blood sugar is very risky to your health.  you should see an eye doctor and dentist every year.  It is very important to get all recommended vaccinations.  Controlling your blood pressure and cholesterol drastically reduces the damage diabetes does to your body.  Those who smoke should quit.  Please discuss these with your doctor.  check your blood sugar once a day.  vary the time of day when you check, between before the 3 meals, and at bedtime.  also check if you have symptoms of your blood sugar being too high or too low.  please keep a record of the readings and bring it to your next appointment here (or you can bring the meter itself).  You can write it on any piece of paper.  please call us sooner if your blood sugar goes below 70, or if most of your readings are over 200.   We will need to take this complex situation in stages.   I have sent a prescription to your pharmacy, to change metformin to Rybelsus.   [facility] is also a good option.   Please come back for a follow-up appointment in 2 months.

## 2021-03-15 NOTE — Progress Notes (Signed)
Subjective:    Patient ID: Grant Bowers, male    DOB: [birth year], 75 y.o.   MRN: [id number]  HPI pt is referred by Dr Jenny Reichmann, for diabetes.  Pt states DM was dx'ed in early 0254; it is complicated by CRI; he has never been on insulin; pt says his diet and exercise are good; he has never had pancreatitis, pancreatic surgery, severe hypoglycemia or DKA.  He takes metformin as rx'ed.  He brings a record of his fasting cbg's which I have reviewed today.  It varies from 106-246.   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   [place];   followed by [facility] cardiology--- dr Caryl Comes   ED (erectile dysfunction)    GERD (gastroesophageal reflux disease)    History of 2019 novel coronavirus disease (COVID-19) 05/07/2020   per pt mild symptoms all resolved with exception DOE   History of cardiac murmur as a child    History of kidney stones    Hyperlipidemia    Hypertension    followed by pcp and cardiology   Hypogonadism male    Mild atherosclerosis of both carotid arteries 09/23/2015   duplex doppler in epic bilateral ICA 1-39%   OSA on CPAP    per pt uses nightly   Post-COVID chronic dyspnea    had covid 05-07-2020;  per pt never had issue prior to covid ;  PFT 10-19-2020 normal per dr c. young   Right ureteral calculus 02/11/2010   Trifascicular block    alternating with BBB per dr Caryl Comes note;  s/p PPM 07-09-2020;   echo 07-29-2020 mild LVH, G1DD, ef 65-70%, moderate LAE, mild MR without stenosis    Past Surgical History:  Procedure Laterality Date   CARDIAC CATHETERIZATION  2009   per [place] in [place] , told was normal   CARDIAC CATHETERIZATION  02/17/2010   @[facility]   by dr Angelena Form--- no angiographic evidence cad, normal lvsf,  false positive stress test   COLONOSCOPY  12/07/2006   [facility] in [place], URETEROSCOPY AND STENT PLACEMENT  Right 10/29/2020   Procedure: CYSTOSCOPY WITH RETROGRADE PYELOGRAM, URETEROSCOPY AND STENT PLACEMENT;  Surgeon: Franchot Gallo, MD;  Location: [facility];  Service: Urology;  Laterality: Right;   CYSTOSCOPY WITH RETROGRADE PYELOGRAM, URETEROSCOPY AND STENT PLACEMENT Right 11/09/2020   Procedure: CYSTOSCOPY WITH RIGHT  RETROGRADE PYELOGRAM, URETEROSCOPY AND STENT EXHANGE;  Surgeon: Remi Haggard, MD;  Location: [facility];  Service: Urology;  Laterality: Right;  30 MINS   CYSTOSCOPY WITH URETEROSCOPY, STONE BASKETRY AND STENT PLACEMENT  2012   EXTRACORPOREAL SHOCK WAVE LITHOTRIPSY Right 04/29/2020   Procedure: EXTRACORPOREAL SHOCK WAVE LITHOTRIPSY (ESWL);  Surgeon: Robley Fries, MD;  Location: [facility];  Service: Urology;  Laterality: Right;   HOLMIUM LASER APPLICATION Right 05/17/621   Procedure: HOLMIUM LASER APPLICATION;  Surgeon: Remi Haggard, MD;  Location: [facility];  Service: Urology;  Laterality: Right;   KNEE ARTHROSCOPY Left 2008   PACEMAKER IMPLANT N/A 07/09/2020   Procedure: PACEMAKER IMPLANT;  Surgeon: Deboraha Sprang, MD;  Location: [facility];  Service: Cardiovascular;  Laterality: N/A;    Social History   Socioeconomic History   Marital status: Married    Spouse name: Not on file   Number of children: Not on file   Years  of education: Not on file   Highest education level: Not on file  Occupational History   Not on file  Tobacco Use   Smoking status: Never   Smokeless tobacco: Never  Vaping Use   Vaping Use: Never used  Substance and Sexual Activity   Alcohol use: Yes    Alcohol/week: 0.0 standard drinks    Comment: very rare    Drug use: Never   Sexual activity: Not on file  Other Topics Concern   Not on file  Social History Narrative   Live with wife; one grown in [place]   2 grand children   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 Determinants of Radio broadcast assistant Strain: Not on  file  Food Insecurity: Not on file  Transportation Needs: Not on file  Physical Activity: Not on file  Stress: Not on file  Social Connections: Not on file  Intimate Partner Violence: Not on file    Current Outpatient Medications on File Prior to Visit  Medication Sig Dispense Refill   Alcohol Swabs (B-D SINGLE USE SWABS REGULAR) PADS Use as directed once per day E11.9 100 each 12   amLODipine (NORVASC) 10 MG tablet TAKE 1 TABLET EVERY DAY 90 tablet 1   Blood Glucose Calibration (TRUE METRIX LEVEL 1) Low SOLN Use as directed once per day E11.9 1 each 1   cetirizine (ZYRTEC) 10 MG tablet TAKE 1 TABLET EVERY DAY 90 tablet 1   fluticasone (FLONASE) 50 MCG/ACT nasal spray USE 2 SPRAYS IN EACH NOSTRIL EVERY DAY 48 g 3   glucose blood (TRUE METRIX BLOOD GLUCOSE TEST) test strip Use as instructed once per day E11.9 100 each 12   hydrochlorothiazide (HYDRODIURIL) 25 MG tablet Take 1 tablet (25 mg total) by mouth daily. 90 tablet 3   irbesartan (AVAPRO) 300 MG tablet TAKE 1 TABLET AT BEDTIME 90 tablet 1   Multiple Vitamins-Minerals (CENTRUM ADULTS) TABS Take 1 tablet by mouth daily.     omeprazole (PRILOSEC) 10 MG capsule TAKE 1 CAPSULE EVERY DAY 90 capsule 1   rosuvastatin (CRESTOR) 20 MG tablet TAKE 1 TABLET EVERY DAY ( ANNUAL APPOINTMENT DUE IN JULY MUST SEE PROVIDER FOR FURTHER REFILLS ) 90 tablet 0   sildenafil (REVATIO) 20 MG tablet 3-5 tabs by mouth every other day as needed 60 tablet 2   tamsulosin (FLOMAX) 0.4 MG CAPS capsule Take 1 capsule (0.4 mg total) by mouth daily. 90 capsule 3   TRUEplus Lancets 33G MISC Use as directed once per day E11.9 100 each 12   No current facility-administered medications on file prior to visit.    No Known Allergies  Family History  Problem Relation Age of Onset   Pancreatic cancer Mother    Diabetes Father    Diabetes Brother    Colon polyps Brother    Colon cancer Neg Hx    Esophageal cancer Neg Hx    Rectal cancer Neg Hx    Stomach cancer  Neg Hx     BP 136/62   Pulse 76   Ht 6' (1.829 m)   Wt 203 lb 3.2 oz (92.2 kg)   SpO2 96%   BMI 27.56 kg/m     Review of Systems denies sob, n/v/HB, memory loss.  He has lost a few lbs--intentional.      Objective:   Physical Exam Pulses: dorsalis pedis intact bilat.   MSK: no deformity of the feet CV: no leg edema Skin:  no ulcer  on the feet.  normal color and temp on the feet.   Neuro: sensation is intact to touch on the feet.     Lab Results  Component Value Date   CREATININE 1.49 11/16/2020   BUN 25 (H) 11/16/2020   NA 138 11/16/2020   K 3.6 11/16/2020   CL 101 11/16/2020   CO2 28 11/16/2020   Lab Results  Component Value Date   TSH 1.68 11/16/2020   Lab Results  Component Value Date   HGBA1C 6.7 (A) 03/15/2021   I have reviewed outside records, and summarized: Pt was noted to have elevated A1c, and referred here.  Wellness and Vit-D def were also addressed     Assessment & Plan:  Type 2 DM: uncontrolled CRI: d/c metformin.    Patient Instructions  good diet and exercise significantly improve the control of your diabetes.  please let me know if you wish to be referred to a dietician.  high blood sugar is very risky to your health.  you should see an eye doctor and dentist every year.  It is very important to get all recommended vaccinations.  Controlling your blood pressure and cholesterol drastically reduces the damage diabetes does to your body.  Those who smoke should quit.  Please discuss these with your doctor.  check your blood sugar once a day.  vary the time of day when you check, between before the 3 meals, and at bedtime.  also check if you have symptoms of your blood sugar being too high or too low.  please keep a record of the readings and bring it to your next appointment here (or you can bring the meter itself).  You can write it on any piece of paper.  please call us sooner if your blood sugar goes below 70, or if most of your readings are over  200.   We will need to take this complex situation in stages.   I have sent a prescription to your pharmacy, to change metformin to Rybelsus.   [facility] is also a good option.   Please come back for a follow-up appointment in 2 months.

## 2021-03-16 LAB — TSH: TSH: 1.59 u[IU]/mL (ref 0.35–5.50)

## 2021-03-19 DIAGNOSIS — E119 Type 2 diabetes mellitus without complications: Secondary | ICD-10-CM | POA: Insufficient documentation

## 2021-03-24 DIAGNOSIS — G4731 Primary central sleep apnea: Secondary | ICD-10-CM | POA: Diagnosis not present

## 2021-03-29 ENCOUNTER — Other Ambulatory Visit: Payer: Self-pay | Admitting: Internal Medicine

## 2021-04-12 ENCOUNTER — Ambulatory Visit (INDEPENDENT_AMBULATORY_CARE_PROVIDER_SITE_OTHER): Payer: Medicare PPO

## 2021-04-12 DIAGNOSIS — I441 Atrioventricular block, second degree: Secondary | ICD-10-CM | POA: Diagnosis not present

## 2021-04-12 LAB — CUP PACEART REMOTE DEVICE CHECK
Battery Remaining Longevity: 93 mo
Battery Remaining Percentage: 94 %
Battery Voltage: 2.99 V
Brady Statistic AP VP Percent: 29 %
Brady Statistic AP VS Percent: 1 %
Brady Statistic AS VP Percent: 69 %
Brady Statistic AS VS Percent: 1 %
Brady Statistic RA Percent Paced: 29 %
Brady Statistic RV Percent Paced: 98 %
Date Time Interrogation Session: 20230103020013
Implantable Lead Implant Date: 20220401
Implantable Lead Implant Date: 20220401
Implantable Lead Location: 753859
Implantable Lead Location: 753860
Implantable Lead Model: 3830
Implantable Pulse Generator Implant Date: 20220401
Lead Channel Impedance Value: 540 Ohm
Lead Channel Impedance Value: 540 Ohm
Lead Channel Pacing Threshold Amplitude: 0.25 V
Lead Channel Pacing Threshold Amplitude: 1.125 V
Lead Channel Pacing Threshold Pulse Width: 0.4 ms
Lead Channel Pacing Threshold Pulse Width: 0.4 ms
Lead Channel Sensing Intrinsic Amplitude: 3.4 mV
Lead Channel Sensing Intrinsic Amplitude: 6.8 mV
Lead Channel Setting Pacing Amplitude: 1.375
Lead Channel Setting Pacing Amplitude: 3.5 V
Lead Channel Setting Pacing Pulse Width: 0.4 ms
Lead Channel Setting Sensing Sensitivity: 2 mV
Pulse Gen Model: 2272
Pulse Gen Serial Number: 3912376

## 2021-04-21 NOTE — Progress Notes (Signed)
Remote pacemaker transmission.   

## 2021-04-24 DIAGNOSIS — G4731 Primary central sleep apnea: Secondary | ICD-10-CM | POA: Diagnosis not present

## 2021-04-25 ENCOUNTER — Ambulatory Visit (INDEPENDENT_AMBULATORY_CARE_PROVIDER_SITE_OTHER): Payer: Medicare PPO | Admitting: Internal Medicine

## 2021-04-25 ENCOUNTER — Encounter: Payer: Self-pay | Admitting: Internal Medicine

## 2021-04-25 ENCOUNTER — Other Ambulatory Visit: Payer: Self-pay

## 2021-04-25 VITALS — BP 144/62 | HR 92 | Ht 72.0 in | Wt 197.0 lb

## 2021-04-25 DIAGNOSIS — E538 Deficiency of other specified B group vitamins: Secondary | ICD-10-CM

## 2021-04-25 DIAGNOSIS — E1165 Type 2 diabetes mellitus with hyperglycemia: Secondary | ICD-10-CM | POA: Diagnosis not present

## 2021-04-25 DIAGNOSIS — N1831 Chronic kidney disease, stage 3a: Secondary | ICD-10-CM | POA: Diagnosis not present

## 2021-04-25 DIAGNOSIS — N189 Chronic kidney disease, unspecified: Secondary | ICD-10-CM | POA: Insufficient documentation

## 2021-04-25 DIAGNOSIS — E559 Vitamin D deficiency, unspecified: Secondary | ICD-10-CM | POA: Diagnosis not present

## 2021-04-25 DIAGNOSIS — E7800 Pure hypercholesterolemia, unspecified: Secondary | ICD-10-CM | POA: Diagnosis not present

## 2021-04-25 DIAGNOSIS — I10 Essential (primary) hypertension: Secondary | ICD-10-CM

## 2021-04-25 DIAGNOSIS — Z0001 Encounter for general adult medical examination with abnormal findings: Secondary | ICD-10-CM

## 2021-04-25 MED ORDER — HYDROCHLOROTHIAZIDE 25 MG PO TABS: 12.5000 mg | ORAL_TABLET | Freq: Every day | ORAL | 3 refills | Status: DC

## 2021-04-25 NOTE — Patient Instructions (Signed)
Ok to decrease the HCT fluid pill to 12. 5 mg per day  Please continue all other medications as before, and refills have been done if requested.  Please have the pharmacy call with any other refills you may need.  Please continue your efforts at being more active, low cholesterol diet, and weight control.  You are otherwise up to date with prevention measures today.  Please keep your appointments with your specialists as you may have planned  Please go to the LAB at the blood drawing area for the tests to be done - at the [facility] in 3 months  You will be contacted by phone if any changes need to be made immediately.  Otherwise, you will receive a letter about your results with an explanation, but please check with MyChart first.  Please remember to sign up for MyChart if you have not done so, as this will be important to you in the future with finding out test results, communicating by private email, and scheduling acute appointments online when needed.  Please make an Appointment to return in 6 months, or sooner if needed

## 2021-04-25 NOTE — Assessment & Plan Note (Addendum)
Last vitamin D Lab Results  Component Value Date   VD25OH 38.56 11/16/2020   Low normal, to increase oral replacement to 4000 u qd

## 2021-04-25 NOTE — Progress Notes (Signed)
Patient ID: Grant Bowers, male   DOB: 12/25/1945, 76 y.o.   MRN: [id number]         Chief Complaint:: wellness exam and htn, dm, ckd       HPI:  Grant Bowers is a 76 y.o. male here for wellness exam; declines covid vax, shingrix o/w up to date                        Also BP too low in 80s sbp several times recently, with wt loss 15 lbs intentional.  Pt denies chest pain, increased sob or doe, wheezing, orthopnea, PND, increased LE swelling, palpitations, dizziness or syncope.   Pt denies polydipsia, polyuria, or new focal neuro s/s   Pt denies fever, wt loss, night sweats, loss of appetite, or other constitutional symptoms  no other new complaints    Wt Readings from Last 3 Encounters:  04/25/21 197 lb (89.4 kg)  03/15/21 203 lb 3.2 oz (92.2 kg)  11/16/20 213 lb (96.6 kg)   BP Readings from Last 3 Encounters:  04/25/21 (!) 144/62  03/15/21 136/62  11/16/20 136/70   Immunization History  Administered Date(s) Administered   Influenza Split 03/13/2011   Influenza Whole 02/11/2008, 01/08/2009, 02/11/2010   Influenza, High Dose Seasonal PF 01/10/2019   Influenza-Unspecified 02/01/2018, 03/22/2020, 03/14/2021   Moderna Sars-Covid-2 Vaccination 07/10/2019, 08/09/2019, 10/20/2019, 04/12/2020   Pneumococcal Conjugate-13 09/02/2013   Pneumococcal Polysaccharide-23 09/17/2014   Td 04/10/2000, 01/09/2007   Tdap 10/31/2018   Zoster, Live 04/08/2008   There are no preventive care reminders to display for this patient.     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   [place];   followed by [facility] cardiology--- dr Caryl Comes   ED (erectile dysfunction)    GERD (gastroesophageal reflux disease)    History of 2019 novel coronavirus disease (COVID-19) 05/07/2020   per pt mild symptoms all resolved with exception DOE   History of cardiac murmur as a child    History of kidney  stones    Hyperlipidemia    Hypertension    followed by pcp and cardiology   Hypogonadism male    Mild atherosclerosis of both carotid arteries 09/23/2015   duplex doppler in epic bilateral ICA 1-39%   OSA on CPAP    per pt uses nightly   Post-COVID chronic dyspnea    had covid 05-07-2020;  per pt never had issue prior to covid ;  PFT 10-19-2020 normal per dr c. young   Right ureteral calculus 02/11/2010   Trifascicular block    alternating with BBB per dr Caryl Comes note;  s/p PPM 07-09-2020;   echo 07-29-2020 mild LVH, G1DD, ef 65-70%, moderate LAE, mild MR without stenosis   Past Surgical History:  Procedure Laterality Date   CARDIAC CATHETERIZATION  2009   per pt in [place] , told was normal   CARDIAC CATHETERIZATION  02/17/2010   @[facility]   by dr Angelena Form--- no angiographic evidence cad, normal lvsf,  false positive stress test   COLONOSCOPY  12/07/2006   [facility] in [place], URETEROSCOPY AND STENT PLACEMENT Right 10/29/2020   Procedure: CYSTOSCOPY WITH RETROGRADE PYELOGRAM, URETEROSCOPY AND STENT PLACEMENT;  Surgeon: Franchot Gallo, MD;  Location: [facility];  Service: Urology;  Laterality: Right;   CYSTOSCOPY WITH RETROGRADE PYELOGRAM, URETEROSCOPY AND STENT PLACEMENT  Right 11/09/2020   Procedure: CYSTOSCOPY WITH RIGHT  RETROGRADE PYELOGRAM, URETEROSCOPY AND STENT EXHANGE;  Surgeon: Remi Haggard, MD;  Location[facility];  Service: Urology;  Laterality: Right;  30 MINS   CYSTOSCOPY WITH URETEROSCOPY, STONE BASKETRY AND STENT PLACEMENT  2012   EXTRACORPOREAL SHOCK WAVE LITHOTRIPSY Right 04/29/2020   Procedure: EXTRACORPOREAL SHOCK WAVE LITHOTRIPSY (ESWL);  Surgeon: Robley Fries, MD;  Location: [facility];  Service: Urology;  Laterality: Right;   HOLMIUM LASER APPLICATION Right 0/04/7508   Procedure: HOLMIUM LASER APPLICATION;  Surgeon: Remi Haggard, MD;  Location: [facility];  Service:  Urology;  Laterality: Right;   KNEE ARTHROSCOPY Left 2008   PACEMAKER IMPLANT N/A 07/09/2020   Procedure: PACEMAKER IMPLANT;  Surgeon: Deboraha Sprang, MD;  Location: [facility];  Service: Cardiovascular;  Laterality: N/A;    reports that he has never smoked. He has never used smokeless tobacco. He reports current alcohol use. He reports that he does not use drugs. family history includes Colon polyps in his brother; Diabetes in his brother and father; Pancreatic cancer in his mother. No Known Allergies Current Outpatient Medications on File Prior to Visit  Medication Sig Dispense Refill   Alcohol Swabs (B-D SINGLE USE SWABS REGULAR) PADS Use as directed once per day E11.9 100 each 12   amLODipine (NORVASC) 10 MG tablet TAKE 1 TABLET EVERY DAY 90 tablet 1   Blood Glucose Calibration (TRUE METRIX LEVEL 1) Low SOLN Use as directed once per day E11.9 1 each 1   cetirizine (ZYRTEC) 10 MG tablet TAKE 1 TABLET EVERY DAY 90 tablet 1   fluticasone (FLONASE) 50 MCG/ACT nasal spray USE 2 SPRAYS IN EACH NOSTRIL EVERY DAY 48 g 3   glucose blood (TRUE METRIX BLOOD GLUCOSE TEST) test strip TEST BLOOD SUGAR TWICE DAILY AS INSTRUCTED 200 strip 3   irbesartan (AVAPRO) 300 MG tablet TAKE 1 TABLET AT BEDTIME 90 tablet 1   Multiple Vitamins-Minerals (CENTRUM ADULTS) TABS Take 1 tablet by mouth daily.     omeprazole (PRILOSEC) 10 MG capsule TAKE 1 CAPSULE EVERY DAY 90 capsule 1   rosuvastatin (CRESTOR) 20 MG tablet TAKE 1 TABLET EVERY DAY ( ANNUAL APPOINTMENT DUE IN JULY MUST SEE PROVIDER FOR FURTHER REFILLS ) 90 tablet 0   Semaglutide (RYBELSUS) 3 MG TABS Take 3 mg by mouth daily. 90 tablet 3   sildenafil (REVATIO) 20 MG tablet 3-5 tabs by mouth every other day as needed 60 tablet 2   tamsulosin (FLOMAX) 0.4 MG CAPS capsule Take 1 capsule (0.4 mg total) by mouth daily. 90 capsule 3   TRUEplus Lancets 33G MISC Use as directed once per day E11.9 100 each 12   No current facility-administered medications  on file prior to visit.        ROS:  All others reviewed and negative.  Objective        PE:  BP (!) 144/62 (BP Location: Right Arm, Patient Position: Sitting, Cuff Size: Large)    Pulse 92    Ht 6' (1.829 m)    Wt 197 lb (89.4 kg)    SpO2 97%    BMI 26.72 kg/m                 Constitutional: Pt appears in NAD               HENT: Head: NCAT.  Right Ear: External ear normal.                 Left Ear: External ear normal.                Eyes: . Pupils are equal, round, and reactive to light. Conjunctivae and EOM are normal               Nose: without d/c or deformity               Neck: Neck supple. Gross normal ROM               Cardiovascular: Normal rate and regular rhythm.                 Pulmonary/Chest: Effort normal and breath sounds without rales or wheezing.                Abd:  Soft, NT, ND, + BS, no organomegaly               Neurological: Pt is alert. At baseline orientation, motor grossly intact               Skin: Skin is warm. No rashes, no other new lesions, LE edema - none               Psychiatric: Pt behavior is normal without agitation   Micro: none  Cardiac tracings I have personally interpreted today:  none  Pertinent Radiological findings (summarize): none   Lab Results  Component Value Date   WBC 7.2 11/16/2020   HGB 13.6 11/16/2020   HCT 39.2 11/16/2020   PLT 264.0 11/16/2020   GLUCOSE 109 (H) 03/15/2021   CHOL 130 11/16/2020   TRIG 91.0 11/16/2020   HDL 42.80 11/16/2020   LDLDIRECT 150.3 02/11/2010   LDLCALC 69 11/16/2020   ALT 15 11/16/2020   AST 18 11/16/2020   NA 140 03/15/2021   K 3.8 03/15/2021   CL 104 03/15/2021   CREATININE 1.51 (H) 03/15/2021   BUN 27 (H) 03/15/2021   CO2 27 03/15/2021   TSH 1.59 03/15/2021   PSA 4.80 (H) 06/01/2020   INR 1.2 ratio (H) 02/15/2010   HGBA1C 6.7 (A) 03/15/2021   Assessment/Plan:  Grant Bowers is a 76 y.o. White or Caucasian [1] male with  has a past medical history of BPH with  elevated PSA, Cardiac pacemaker in situ (07/09/2020), ED (erectile dysfunction), GERD (gastroesophageal reflux disease), History of 2019 novel coronavirus disease (COVID-19) (05/07/2020), History of cardiac murmur as a child, History of kidney stones, Hyperlipidemia, Hypertension, Hypogonadism male, Mild atherosclerosis of both carotid arteries (09/23/2015), OSA on CPAP, Post-COVID chronic dyspnea, Right ureteral calculus (02/11/2010), and Trifascicular block.  Vitamin D deficiency Last vitamin D Lab Results  Component Value Date   VD25OH 38.56 11/16/2020   Low normal, to increase oral replacement to 4000 u qd  Encounter for well adult exam with abnormal findings Age and sex appropriate education and counseling updated with regular exercise and diet Referrals for preventative services - none needed Immunizations addressed - declines covid booster, shingrix Smoking counseling  - none needed Evidence for depression or other mood disorder - none significant Most recent labs reviewed. I have personally reviewed and have noted: 1) the patient's medical and social history 2) The patient's current medications and supplements 3) The patient's height, weight, and BMI have been recorded in the chart   CKD (chronic kidney disease) Lab Results  Component Value Date  CREATININE 1.51 (H) 03/15/2021   Stable overall, cont to avoid nephrotoxins   Diabetes ([place]) Lab Results  Component Value Date   HGBA1C 6.7 (A) 03/15/2021   Stable, pt to continue current medical treatment rybelsus   Hyperlipidemia Lab Results  Component Value Date   LDLCALC 69 11/16/2020   Stable, pt to continue current statin crestor   Hypertension, uncontrolled BP Readings from Last 3 Encounters:  04/25/21 (!) 144/62  03/15/21 136/62  11/16/20 136/70   Uncontrolled here to today, but low at home, ok to decrease the hct to 12.5 qq, pt to continue other medical treatment norvasc, avapro  Followup: Return in  about 6 months (around 10/23/2021).  Cathlean Cower, MD 04/30/2021 5:37 PM [facility]

## 2021-04-30 ENCOUNTER — Encounter: Payer: Self-pay | Admitting: Internal Medicine

## 2021-04-30 NOTE — Assessment & Plan Note (Signed)
Lab Results  Component Value Date   LDLCALC 69 11/16/2020   Stable, pt to continue current statin crestor

## 2021-04-30 NOTE — Assessment & Plan Note (Signed)

## 2021-04-30 NOTE — Assessment & Plan Note (Signed)
Lab Results  Component Value Date   CREATININE 1.51 (H) 03/15/2021   Stable overall, cont to avoid nephrotoxins

## 2021-04-30 NOTE — Assessment & Plan Note (Signed)
Lab Results  Component Value Date   HGBA1C 6.7 (A) 03/15/2021   Stable, pt to continue current medical treatment rybelsus

## 2021-04-30 NOTE — Assessment & Plan Note (Signed)
BP Readings from Last 3 Encounters:  04/25/21 (!) 144/62  03/15/21 136/62  11/16/20 136/70   Uncontrolled here to today, but low at home, ok to decrease the hct to 12.5 qq, pt to continue other medical treatment norvasc, avapro

## 2021-05-03 DIAGNOSIS — H25813 Combined forms of age-related cataract, bilateral: Secondary | ICD-10-CM | POA: Diagnosis not present

## 2021-05-03 DIAGNOSIS — H40013 Open angle with borderline findings, low risk, bilateral: Secondary | ICD-10-CM | POA: Diagnosis not present

## 2021-05-03 DIAGNOSIS — E119 Type 2 diabetes mellitus without complications: Secondary | ICD-10-CM | POA: Diagnosis not present

## 2021-05-16 ENCOUNTER — Ambulatory Visit (INDEPENDENT_AMBULATORY_CARE_PROVIDER_SITE_OTHER): Payer: Medicare PPO | Admitting: Endocrinology

## 2021-05-16 ENCOUNTER — Other Ambulatory Visit: Payer: Self-pay

## 2021-05-16 VITALS — BP 184/80 | HR 86 | Ht 72.0 in | Wt 200.4 lb

## 2021-05-16 DIAGNOSIS — E1165 Type 2 diabetes mellitus with hyperglycemia: Secondary | ICD-10-CM

## 2021-05-16 LAB — POCT GLYCOSYLATED HEMOGLOBIN (HGB A1C): Hemoglobin A1C: 5.8 % — AB (ref 4.0–5.6)

## 2021-05-16 NOTE — Patient Instructions (Addendum)
Your blood pressure is high today.  Please see your primary care provider soon, to have it rechecked check your blood sugar once a day.  vary the time of day when you check, between before the 3 meals, and at bedtime.  also check if you have symptoms of your blood sugar being too high or too low.  please keep a record of the readings and bring it to your next appointment here (or you can bring the meter itself).  You can write it on any piece of paper.  please call us sooner if your blood sugar goes below 70, or if most of your readings are over 200.   Please continue the same Rybelsus.   Please come back for a follow-up appointment in 6 months.

## 2021-05-16 NOTE — Progress Notes (Signed)
Subjective:    Patient ID: Grant Bowers, male    DOB: [birth year], 76 y.o.   MRN: [id number]  HPI Pt returns for f/u of diabetes mellitus: DM type: 2 Dx'ed: 6712 Complications: CRI Therapy: Rybelsus DKA: never Severe hypoglycemia: never Pancreatitis: never Pancreatic imaging: normal on 2018 CT SDOH: none Other: he has never been on insulin Interval history: Pt says cbg's are well-controlled.  He tolerates Rybelsus as rx'ed. 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   [place];   followed by [facility] cardiology--- dr Caryl Comes   ED (erectile dysfunction)    GERD (gastroesophageal reflux disease)    History of 2019 novel coronavirus disease (COVID-19) 05/07/2020   per pt mild symptoms all resolved with exception DOE   History of cardiac murmur as a child    History of kidney stones    Hyperlipidemia    Hypertension    followed by pcp and cardiology   Hypogonadism male    Mild atherosclerosis of both carotid arteries 09/23/2015   duplex doppler in epic bilateral ICA 1-39%   OSA on CPAP    per pt uses nightly   Post-COVID chronic dyspnea    had covid 05-07-2020;  per pt never had issue prior to covid ;  PFT 10-19-2020 normal per dr c. young   Right ureteral calculus 02/11/2010   Trifascicular block    alternating with BBB per dr Caryl Comes note;  s/p PPM 07-09-2020;   echo 07-29-2020 mild LVH, G1DD, ef 65-70%, moderate LAE, mild MR without stenosis    Past Surgical History:  Procedure Laterality Date   CARDIAC CATHETERIZATION  2009   per pt in [place] , told was normal   CARDIAC CATHETERIZATION  02/17/2010   @[facility]   by dr Angelena Form--- no angiographic evidence cad, normal lvsf,  false positive stress test   COLONOSCOPY  12/07/2006   [facility] in [place], URETEROSCOPY AND STENT PLACEMENT Right 10/29/2020   Procedure: CYSTOSCOPY  WITH RETROGRADE PYELOGRAM, URETEROSCOPY AND STENT PLACEMENT;  Surgeon: Franchot Gallo, MD;  Location: [facility];  Service: Urology;  Laterality: Right;   CYSTOSCOPY WITH RETROGRADE PYELOGRAM, URETEROSCOPY AND STENT PLACEMENT Right 11/09/2020   Procedure: CYSTOSCOPY WITH RIGHT  RETROGRADE PYELOGRAM, URETEROSCOPY AND STENT EXHANGE;  Surgeon: Remi Haggard, MD;  Location: [facility];  Service: Urology;  Laterality: Right;  30 MINS   CYSTOSCOPY WITH URETEROSCOPY, STONE BASKETRY AND STENT PLACEMENT  2012   EXTRACORPOREAL SHOCK WAVE LITHOTRIPSY Right 04/29/2020   Procedure: EXTRACORPOREAL SHOCK WAVE LITHOTRIPSY (ESWL);  Surgeon: Robley Fries, MD;  Location: [facility];  Service: Urology;  Laterality: Right;   HOLMIUM LASER APPLICATION Right 07/13/8097   Procedure: HOLMIUM LASER APPLICATION;  Surgeon: Remi Haggard, MD;  Location: [facility];  Service: Urology;  Laterality: Right;   KNEE ARTHROSCOPY Left 2008   PACEMAKER IMPLANT N/A 07/09/2020   Procedure: PACEMAKER IMPLANT;  Surgeon: Deboraha Sprang, MD;  Location: [facility];  Service: Cardiovascular;  Laterality: N/A;    Social History   Socioeconomic History   Marital status: Married    Spouse name: Not on file   Number of children: Not on file   Years of education: Not on file   Highest education level: Not on file  Occupational History   Not on file  Tobacco  Use   Smoking status: Never   Smokeless tobacco: Never  Vaping Use   Vaping Use: Never used  Substance and Sexual Activity   Alcohol use: Yes    Alcohol/week: 0.0 standard drinks    Comment: very rare    Drug use: Never   Sexual activity: Not on file  Other Topics Concern   Not on file  Social History Narrative   Live with wife; one grown in [place]   2 grand children   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 Determinants of Radio broadcast assistant Strain: Not on file  Food Insecurity: Not on file   Transportation Needs: Not on file  Physical Activity: Not on file  Stress: Not on file  Social Connections: Not on file  Intimate Partner Violence: Not on file    Current Outpatient Medications on File Prior to Visit  Medication Sig Dispense Refill   Alcohol Swabs (B-D SINGLE USE SWABS REGULAR) PADS Use as directed once per day E11.9 100 each 12   amLODipine (NORVASC) 10 MG tablet TAKE 1 TABLET EVERY DAY 90 tablet 1   Blood Glucose Calibration (TRUE METRIX LEVEL 1) Low SOLN Use as directed once per day E11.9 1 each 1   cetirizine (ZYRTEC) 10 MG tablet TAKE 1 TABLET EVERY DAY 90 tablet 1   fluticasone (FLONASE) 50 MCG/ACT nasal spray USE 2 SPRAYS IN EACH NOSTRIL EVERY DAY 48 g 3   glucose blood (TRUE METRIX BLOOD GLUCOSE TEST) test strip TEST BLOOD SUGAR TWICE DAILY AS INSTRUCTED 200 strip 3   hydrochlorothiazide (HYDRODIURIL) 25 MG tablet Take 0.5 tablets (12.5 mg total) by mouth daily. 45 tablet 3   irbesartan (AVAPRO) 300 MG tablet TAKE 1 TABLET AT BEDTIME 90 tablet 1   Multiple Vitamins-Minerals (CENTRUM ADULTS) TABS Take 1 tablet by mouth daily.     omeprazole (PRILOSEC) 10 MG capsule TAKE 1 CAPSULE EVERY DAY 90 capsule 1   rosuvastatin (CRESTOR) 20 MG tablet TAKE 1 TABLET EVERY DAY ( ANNUAL APPOINTMENT DUE IN JULY MUST SEE PROVIDER FOR FURTHER REFILLS ) 90 tablet 0   Semaglutide (RYBELSUS) 3 MG TABS Take 3 mg by mouth daily. 90 tablet 3   sildenafil (REVATIO) 20 MG tablet 3-5 tabs by mouth every other day as needed 60 tablet 2   tamsulosin (FLOMAX) 0.4 MG CAPS capsule Take 1 capsule (0.4 mg total) by mouth daily. 90 capsule 3   TRUEplus Lancets 33G MISC Use as directed once per day E11.9 100 each 12   No current facility-administered medications on file prior to visit.    No Known Allergies  Family History  Problem Relation Age of Onset   Pancreatic cancer Mother    Diabetes Father    Diabetes Brother    Colon polyps Brother    Colon cancer Neg Hx    Esophageal cancer Neg  Hx    Rectal cancer Neg Hx    Stomach cancer Neg Hx     BP (!) 184/80    Pulse 86    Ht 6' (1.829 m)    Wt 200 lb 6.4 oz (90.9 kg)    SpO2 97%    BMI 27.18 kg/m   Review of Systems Denies N/V/HB.      Objective:   Physical Exam    A1c=5.8%    Assessment & Plan:  Type 2 DM: well-controlled.  Please continue the same Rybelsus

## 2021-05-25 DIAGNOSIS — G4731 Primary central sleep apnea: Secondary | ICD-10-CM | POA: Diagnosis not present

## 2021-06-03 DIAGNOSIS — N200 Calculus of kidney: Secondary | ICD-10-CM | POA: Diagnosis not present

## 2021-06-18 IMAGING — DX DG ABDOMEN 1V
2 series · 2 of 2 positions shown · non-contrast
Comparison: 04/19/2020

CLINICAL DATA: Preop for lithotripsy.

EXAM:
ABDOMEN - 1 VIEW

[abdomen kub (1 of 2)]
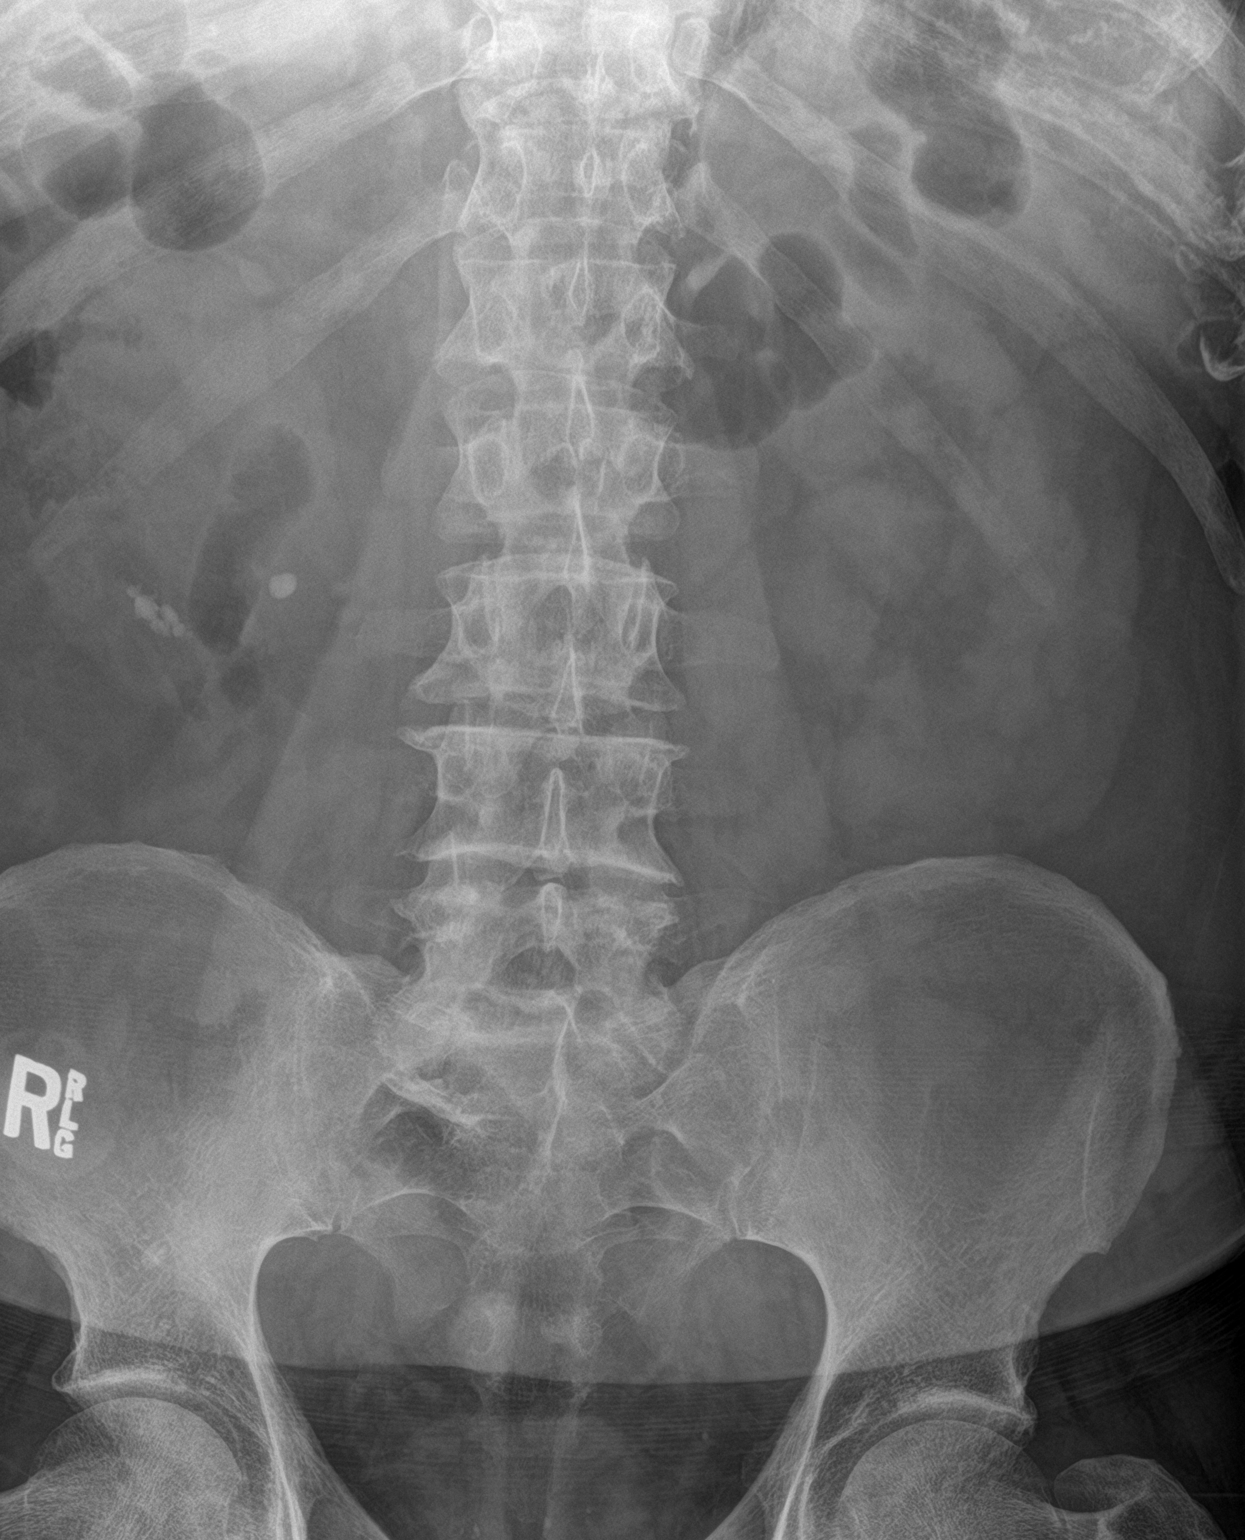

[abdomen kub (2 of 2)]
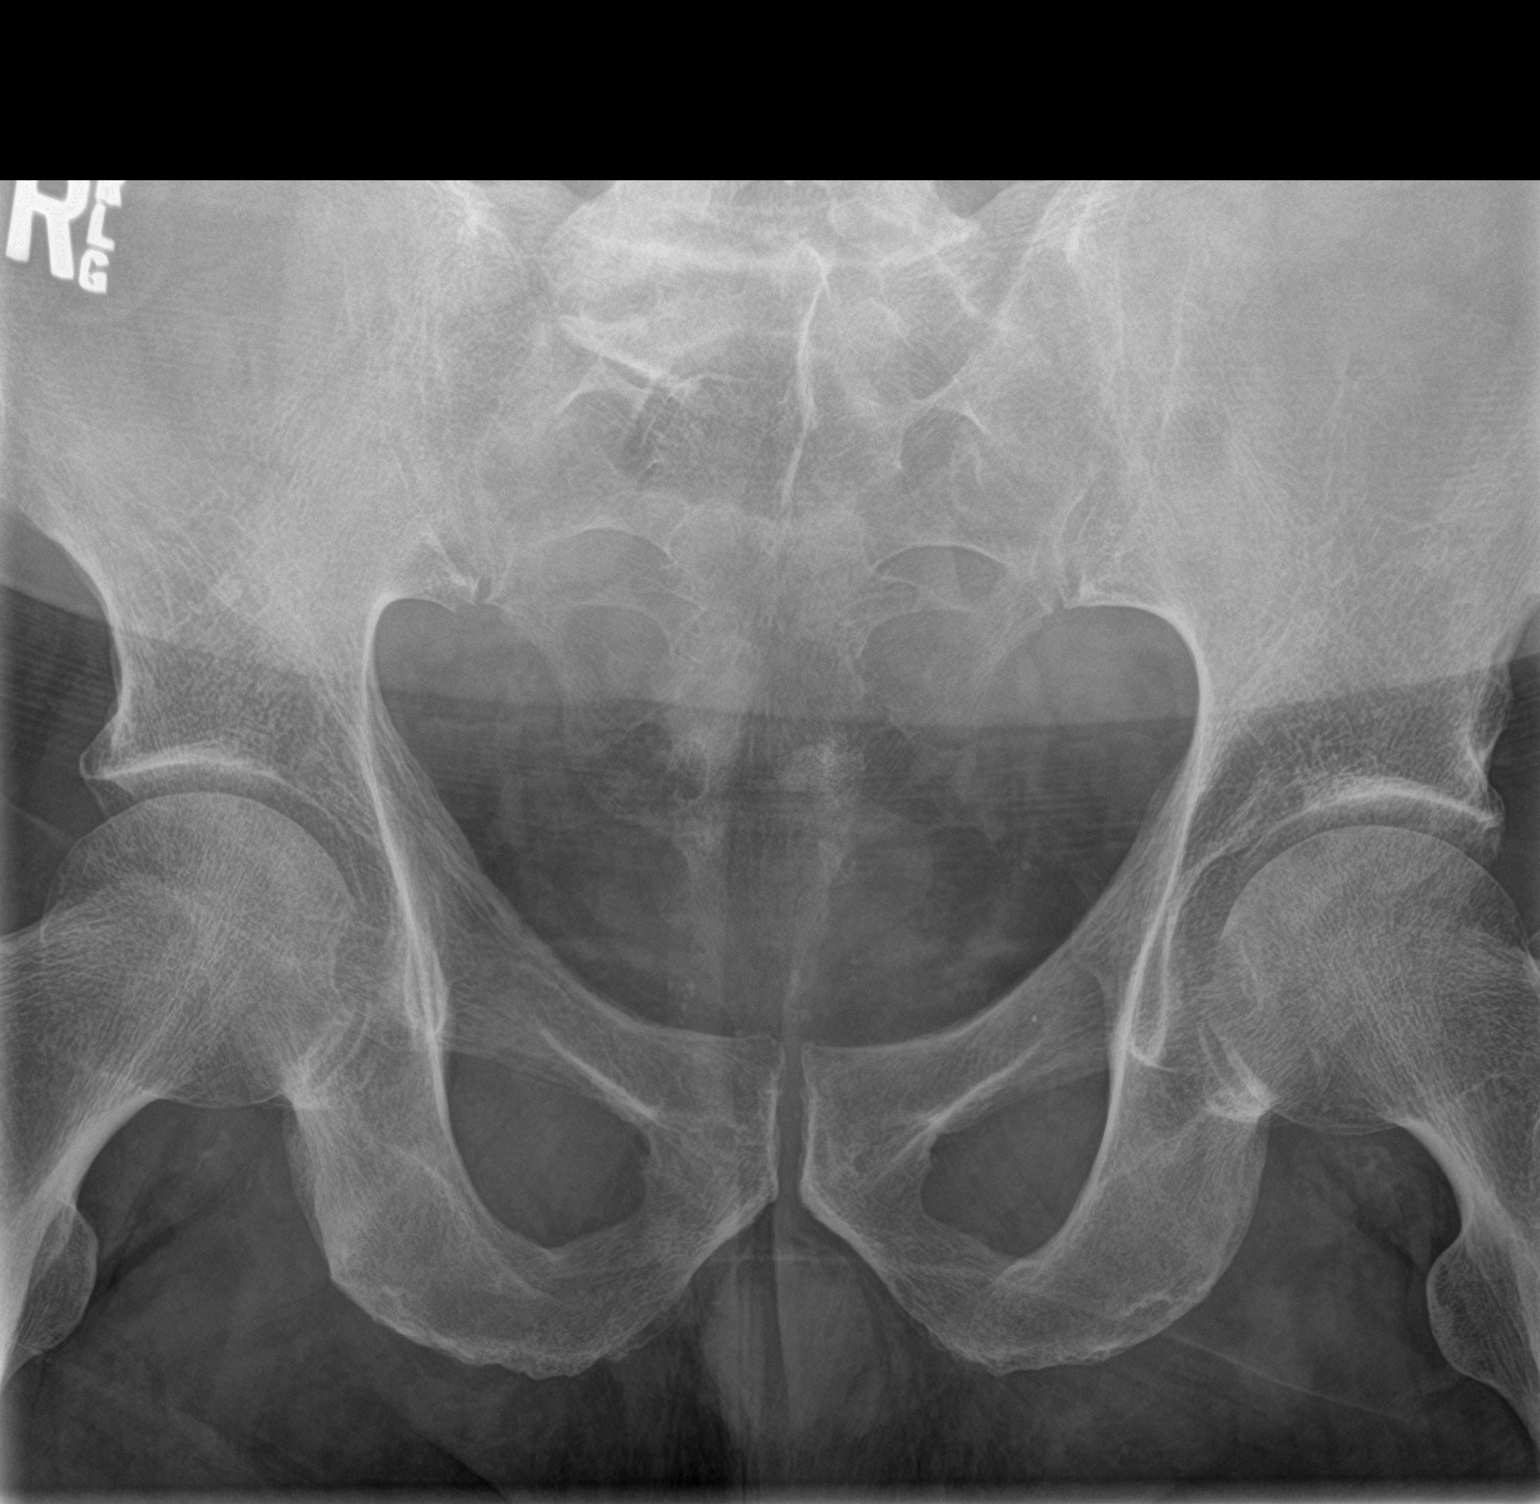

[2 of 2 positions shown; findings below may reference images not displayed]

FINDINGS: Stable lower pole right renal calculi. No definite ureteral calculi.
No left-sided renal calculi.

The bowel gas pattern is unremarkable. The bony structures are
intact.
IMPRESSION: Stable right lower pole renal calculi.

## 2021-07-07 ENCOUNTER — Other Ambulatory Visit: Payer: Self-pay | Admitting: Internal Medicine

## 2021-07-07 NOTE — Telephone Encounter (Signed)
Please refill as per office routine med refill policy (all routine meds to be refilled for 3 mo or monthly (per pt preference) up to one year from last visit, then month to month grace period for 3 mo, then further med refills will have to be denied) ? ?

## 2021-07-11 ENCOUNTER — Other Ambulatory Visit: Payer: Self-pay | Admitting: Internal Medicine

## 2021-07-11 NOTE — Telephone Encounter (Signed)
Please refill as per office routine med refill policy (all routine meds to be refilled for 3 mo or monthly (per pt preference) up to one year from last visit, then month to month grace period for 3 mo, then further med refills will have to be denied) ? ?

## 2021-07-12 ENCOUNTER — Ambulatory Visit (INDEPENDENT_AMBULATORY_CARE_PROVIDER_SITE_OTHER): Payer: Medicare PPO

## 2021-07-12 DIAGNOSIS — I441 Atrioventricular block, second degree: Secondary | ICD-10-CM | POA: Diagnosis not present

## 2021-07-12 LAB — CUP PACEART REMOTE DEVICE CHECK
Battery Remaining Longevity: 88 mo
Battery Remaining Percentage: 91 %
Battery Voltage: 2.99 V
Brady Statistic AP VP Percent: 24 %
Brady Statistic AP VS Percent: 1 %
Brady Statistic AS VP Percent: 74 %
Brady Statistic AS VS Percent: 1 %
Brady Statistic RA Percent Paced: 24 %
Brady Statistic RV Percent Paced: 98 %
Date Time Interrogation Session: 20230404020014
Implantable Lead Implant Date: 20220401
Implantable Lead Implant Date: 20220401
Implantable Lead Location: 753859
Implantable Lead Location: 753860
Implantable Lead Model: 3830
Implantable Pulse Generator Implant Date: 20220401
Lead Channel Impedance Value: 440 Ohm
Lead Channel Impedance Value: 540 Ohm
Lead Channel Pacing Threshold Amplitude: 0.25 V
Lead Channel Pacing Threshold Amplitude: 1.125 V
Lead Channel Pacing Threshold Pulse Width: 0.4 ms
Lead Channel Pacing Threshold Pulse Width: 0.4 ms
Lead Channel Sensing Intrinsic Amplitude: 11.7 mV
Lead Channel Sensing Intrinsic Amplitude: 3 mV
Lead Channel Setting Pacing Amplitude: 1.375
Lead Channel Setting Pacing Amplitude: 3.5 V
Lead Channel Setting Pacing Pulse Width: 0.4 ms
Lead Channel Setting Sensing Sensitivity: 2 mV
Pulse Gen Model: 2272
Pulse Gen Serial Number: 3912376

## 2021-07-15 ENCOUNTER — Encounter: Payer: Self-pay | Admitting: Internal Medicine

## 2021-07-15 ENCOUNTER — Ambulatory Visit (INDEPENDENT_AMBULATORY_CARE_PROVIDER_SITE_OTHER): Payer: Medicare PPO | Admitting: Internal Medicine

## 2021-07-15 VITALS — BP 130/70 | HR 72 | Ht 72.0 in | Wt 202.0 lb

## 2021-07-15 DIAGNOSIS — I10 Essential (primary) hypertension: Secondary | ICD-10-CM | POA: Diagnosis not present

## 2021-07-15 DIAGNOSIS — R001 Bradycardia, unspecified: Secondary | ICD-10-CM | POA: Diagnosis not present

## 2021-07-15 LAB — CUP PACEART INCLINIC DEVICE CHECK
Battery Remaining Longevity: 109 mo
Battery Voltage: 2.99 V
Brady Statistic RA Percent Paced: 24 %
Brady Statistic RV Percent Paced: 98 %
Date Time Interrogation Session: 20230407104904
Implantable Lead Implant Date: 20220401
Implantable Lead Implant Date: 20220401
Implantable Lead Location: 753859
Implantable Lead Location: 753860
Implantable Lead Model: 3830
Implantable Pulse Generator Implant Date: 20220401
Lead Channel Impedance Value: 537.5 Ohm
Lead Channel Impedance Value: 537.5 Ohm
Lead Channel Pacing Threshold Amplitude: 0.5 V
Lead Channel Pacing Threshold Amplitude: 0.5 V
Lead Channel Pacing Threshold Amplitude: 1.125 V
Lead Channel Pacing Threshold Pulse Width: 0.4 ms
Lead Channel Pacing Threshold Pulse Width: 0.4 ms
Lead Channel Pacing Threshold Pulse Width: 0.4 ms
Lead Channel Sensing Intrinsic Amplitude: 4.5 mV
Lead Channel Sensing Intrinsic Amplitude: 5 mV
Lead Channel Setting Pacing Amplitude: 1.375
Lead Channel Setting Pacing Amplitude: 2 V
Lead Channel Setting Pacing Pulse Width: 0.4 ms
Lead Channel Setting Sensing Sensitivity: 2 mV
Pulse Gen Model: 2272
Pulse Gen Serial Number: 3912376

## 2021-07-15 MED ORDER — HYDROCHLOROTHIAZIDE 12.5 MG PO CAPS: 12.5000 mg | ORAL_CAPSULE | Freq: Every day | ORAL | 3 refills | Status: DC

## 2021-07-15 NOTE — Progress Notes (Signed)
? ? ?PCP: Biagio Borg, MD ?  ?Primary EP:  Dr Caryl Comes ? ?Grant Bowers is a 76 y.o. male who presents today for routine electrophysiology followup.  Since last being seen in our clinic, the patient reports doing very well.  He has occasional dizziness and SOB with moderate activity.  Today, he denies symptoms of palpitations, chest pain,  presyncope, or syncope.  The patient is otherwise without complaint today.  ? ?Past Medical History:  ?Diagnosis Date  ? BPH with elevated PSA   ? urologist--- dr Milford Cage--  has had negative prostate bx  ? 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 [employer];   followed by [facility] cardiology--- dr Caryl Comes  ? ED (erectile dysfunction)   ? GERD (gastroesophageal reflux disease)   ? History of 2019 novel coronavirus disease (COVID-19) 05/07/2020  ? per pt mild symptoms all resolved with exception DOE  ? History of cardiac murmur as a child   ? History of kidney stones   ? Hyperlipidemia   ? Hypertension   ? followed by pcp and cardiology  ? Hypogonadism male   ? Mild atherosclerosis of both carotid arteries 09/23/2015  ? duplex doppler in epic bilateral ICA 1-39%  ? OSA on CPAP   ? per pt uses nightly  ? Post-COVID chronic dyspnea   ? had covid 05-07-2020;  per pt never had issue prior to covid ;  PFT 10-19-2020 normal per dr c. young  ? Right ureteral calculus 02/11/2010  ? Trifascicular block   ? alternating with BBB per dr Caryl Comes note;  s/p PPM 07-09-2020;   echo 07-29-2020 mild LVH, G1DD, ef 65-70%, moderate LAE, mild MR without stenosis  ? ?Past Surgical History:  ?Procedure Laterality Date  ? CARDIAC CATHETERIZATION  2009  ? per pt in [place] , told was normal  ? CARDIAC CATHETERIZATION  02/17/2010  ? '@[facility]'$   by dr Angelena Form--- no angiographic evidence cad, normal lvsf,  false positive stress test  ? COLONOSCOPY  12/07/2006  ? [facility] in [place]   ? CYSTOSCOPY WITH RETROGRADE PYELOGRAM, URETEROSCOPY AND STENT PLACEMENT Right 10/29/2020  ? Procedure: CYSTOSCOPY WITH RETROGRADE PYELOGRAM,  URETEROSCOPY AND STENT PLACEMENT;  Surgeon: Franchot Gallo, MD;  Location: [facility];  Service: Urology;  Laterality: Right;  ? CYSTOSCOPY WITH RETROGRADE PYELOGRAM, URETEROSCOPY AND STENT PLACEMENT Right 11/09/2020  ? Procedure: CYSTOSCOPY WITH RIGHT  RETROGRADE PYELOGRAM, URETEROSCOPY AND Judene Companion;  Surgeon: Remi Haggard, MD;  Location: [facility];  Service: Urology;  Laterality: Right;  30 MINS  ? CYSTOSCOPY WITH URETEROSCOPY, STONE BASKETRY AND STENT PLACEMENT  2012  ? EXTRACORPOREAL SHOCK WAVE LITHOTRIPSY Right 04/29/2020  ? Procedure: EXTRACORPOREAL SHOCK WAVE LITHOTRIPSY (ESWL);  Surgeon: Robley Fries, MD;  Location: [facility];  Service: Urology;  Laterality: Right;  ? HOLMIUM LASER APPLICATION Right 11/14/6809  ? Procedure: HOLMIUM LASER APPLICATION;  Surgeon: Remi Haggard, MD;  Location: [facility];  Service: Urology;  Laterality: Right;  ? KNEE ARTHROSCOPY Left 2008  ? PACEMAKER IMPLANT N/A 07/09/2020  ? Procedure: PACEMAKER IMPLANT;  Surgeon: Deboraha Sprang, MD;  Location: [facility];  Service: Cardiovascular;  Laterality: N/A;  ? ? ?ROS- all systems are reviewed and negative except as per HPI above ? ?Current Outpatient Medications  ?Medication Sig Dispense Refill  ? Alcohol Swabs (B-D SINGLE USE SWABS REGULAR) PADS Use as directed once per day E11.9 100 each 12  ? amLODipine (NORVASC) 10 MG tablet TAKE 1 TABLET EVERY  DAY 90 tablet 1  ? aspirin EC 81 MG tablet Take 81 mg by mouth daily. Swallow whole.    ? Blood Glucose Calibration (TRUE METRIX LEVEL 1) Low SOLN Use as directed once per day E11.9 1 each 1  ? cetirizine (ZYRTEC) 10 MG tablet TAKE 1 TABLET EVERY DAY 90 tablet 1  ? Cholecalciferol (VITAMIN D3 SUPER STRENGTH) 50 MCG (2000 UT) TABS Take 1 tablet by mouth daily.    ? fluticasone (FLONASE) 50 MCG/ACT nasal spray USE 2 SPRAYS IN EACH NOSTRIL EVERY DAY (Patient taking differently: 1 spray as needed.) 48 g 3  ? glucose  blood (TRUE METRIX BLOOD GLUCOSE TEST) test strip TEST BLOOD SUGAR TWICE DAILY AS INSTRUCTED 200 strip 3  ? hydrochlorothiazide (HYDRODIURIL) 25 MG tablet Take 0.5 tablets (12.5 mg total) by mouth daily. 45 tablet 3  ? irbesartan (AVAPRO) 300 MG tablet TAKE 1 TABLET AT BEDTIME 90 tablet 1  ? omeprazole (PRILOSEC) 10 MG capsule TAKE 1 CAPSULE EVERY DAY 90 capsule 1  ? rosuvastatin (CRESTOR) 20 MG tablet TAKE 1 TABLET EVERY DAY ( ANNUAL APPOINTMENT DUE IN JULY MUST SEE PROVIDER FOR FURTHER REFILLS ) 90 tablet 0  ? Semaglutide (RYBELSUS) 3 MG TABS Take 3 mg by mouth daily. 90 tablet 3  ? sildenafil (REVATIO) 20 MG tablet 3-5 tabs by mouth every other day as needed 60 tablet 2  ? tamsulosin (FLOMAX) 0.4 MG CAPS capsule TAKE 1 CAPSULE EVERY DAY 90 capsule 3  ? TRUEplus Lancets 33G MISC Use as directed once per day E11.9 100 each 12  ? ZINC PICOLINATE PO Take 1 tablet by mouth daily.    ? Multiple Vitamins-Minerals (CENTRUM ADULTS) TABS Take 1 tablet by mouth daily. (Patient not taking: Reported on 07/15/2021)    ? ?No current facility-administered medications for this visit.  ? ? ?Physical Exam: ?Vitals:  ? 07/15/21 1032  ?BP: 130/70  ?Pulse: 72  ?SpO2: 96%  ?Weight: 202 lb (91.6 kg)  ?Height: 6' (1.829 m)  ? ? ?GEN- The patient is well appearing, alert and oriented x 3 today.   ?Head- normocephalic, atraumatic ?Eyes-  Sclera clear, conjunctiva pink ?Ears- hearing intact ?Oropharynx- clear ?Lungs- Clear to ausculation bilaterally, normal work of breathing ?Chest- pacemaker pocket is well healed ?Heart- Regular rate and rhythm, no murmurs, rubs or gallops, PMI not laterally displaced ?GI- soft, NT, ND, + BS ?Extremities- no clubbing, cyanosis, or edema ? ?Pacemaker interrogation- reviewed in detail today,  See PACEART report ?  ? ?Assessment and Plan: ? ?1. Symptomatic trifascicular block ?Normal pacemaker function ?See Claudia Desanctis Art report ?No changes today ?he is not device dependant today ? ?2. HTN ?I worry about  dehydration ?Reduce hctz to 12.'5mg'$  daily ?Avoid heat exposure as the weather changes ? ?Regular exercise and increased activity advised ? ?Return in a year ? ?Thompson Grayer MD, FACC ?07/15/2021 ?11:18 AM ? ? ?

## 2021-07-15 NOTE — Patient Instructions (Addendum)
Medication Instructions:  ?Decrease the HCTZ to 12.'5mg'$  daily  ?Continue all other medications.    ? ?Labwork: ?none ? ?Testing/Procedures: ?none ? ?Follow-Up: ?1 year - Dr.  Rayann Heman  ? ?Any Other Special Instructions Will Be Listed Below (If Applicable). ? ? ?If you need a refill on your cardiac medications before your next appointment, please call your pharmacy. ? ?

## 2021-07-20 DIAGNOSIS — G4731 Primary central sleep apnea: Secondary | ICD-10-CM | POA: Diagnosis not present

## 2021-07-25 ENCOUNTER — Other Ambulatory Visit: Payer: Medicare PPO

## 2021-07-27 NOTE — Progress Notes (Signed)
Remote pacemaker transmission.   

## 2021-07-28 ENCOUNTER — Telehealth: Payer: Self-pay | Admitting: Endocrinology

## 2021-07-28 ENCOUNTER — Other Ambulatory Visit: Payer: Self-pay | Admitting: Endocrinology

## 2021-07-28 DIAGNOSIS — E1165 Type 2 diabetes mellitus with hyperglycemia: Secondary | ICD-10-CM

## 2021-07-28 NOTE — Telephone Encounter (Signed)
Attempted to contact the patient to inform him that the referral has been placed. LVM for cb if needed ?

## 2021-07-28 NOTE — Telephone Encounter (Signed)
Patient called to ask about the referral to the [facility] - this was supposed to be put in at the same time as his wife - Grant Bowers MRN [id number] ?

## 2021-08-06 IMAGING — DX DG ABDOMEN 1V
2 series · 2 of 2 positions shown · non-contrast
Comparison: CT abdomen and pelvis 04/19/2020. KUB 05/21/2020,
06/04/2020 and 04/29/2020.

CLINICAL DATA: Pre lithotripsy examination for patient right
urinary tract stones.

EXAM:
ABDOMEN - 1 VIEW

[abdomen kub (1 of 2)]
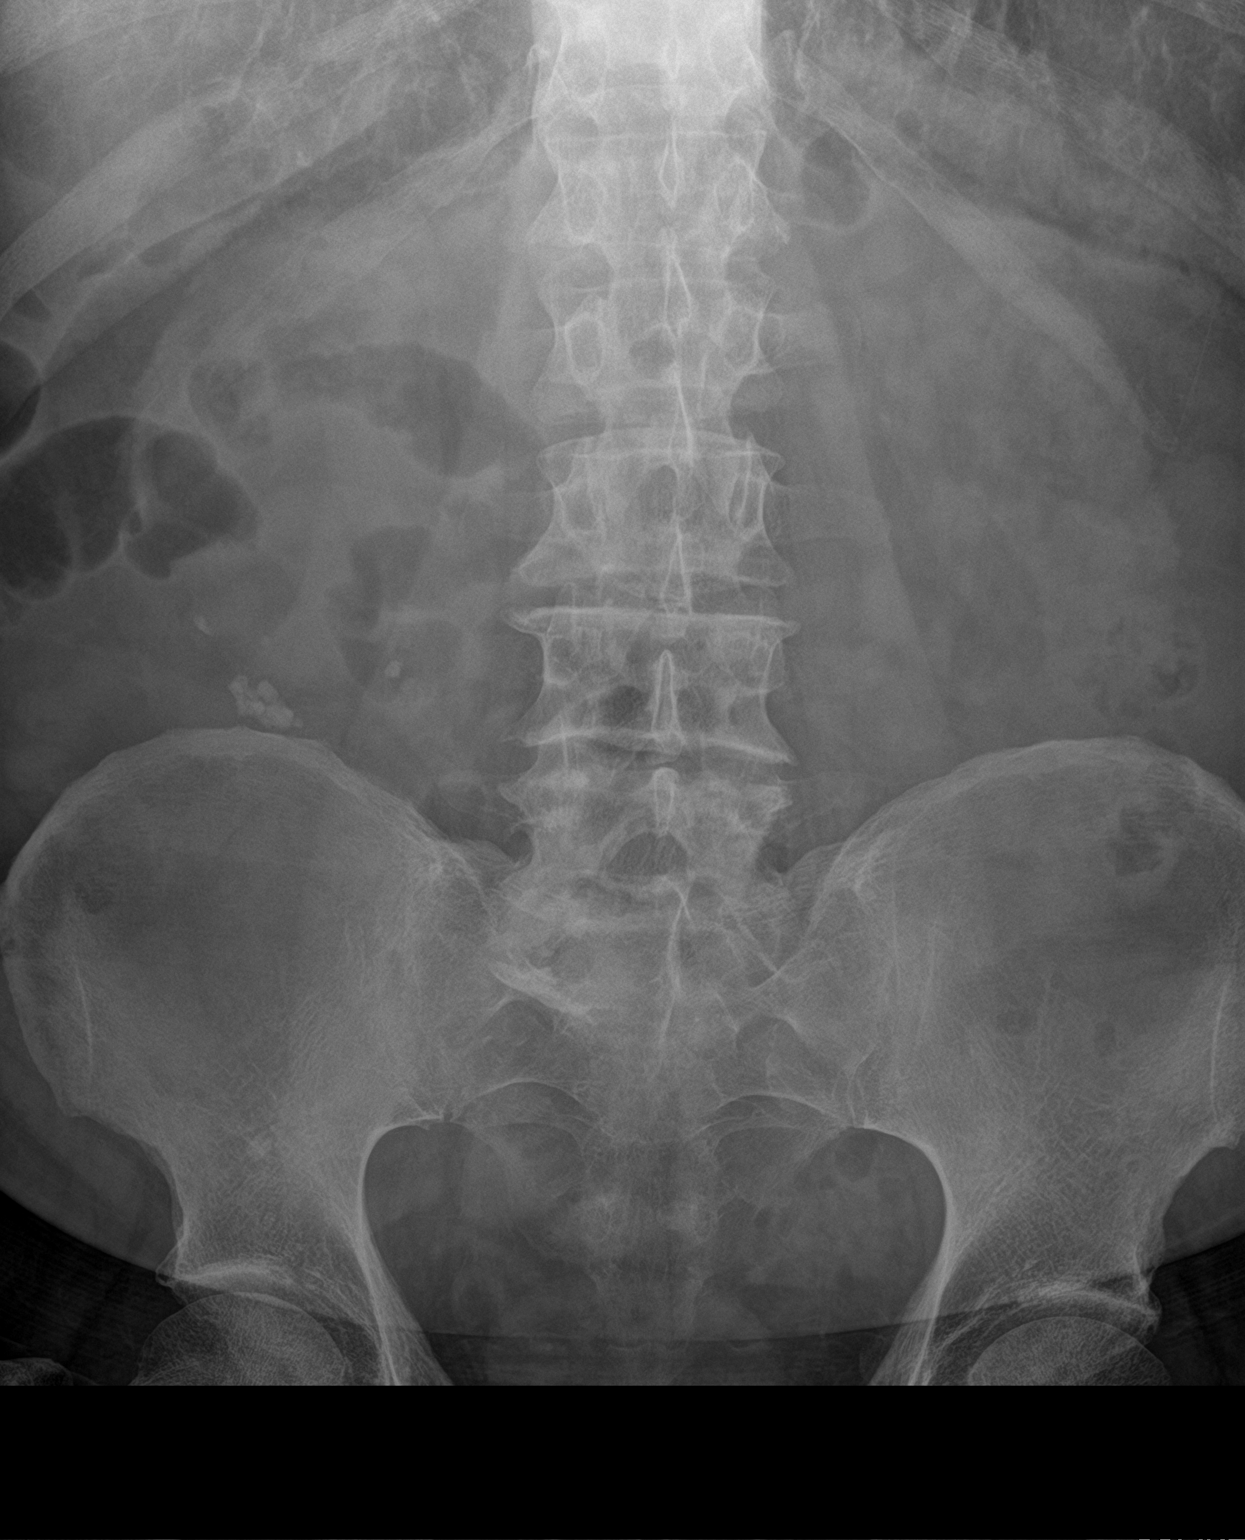

[abdomen kub (2 of 2)]
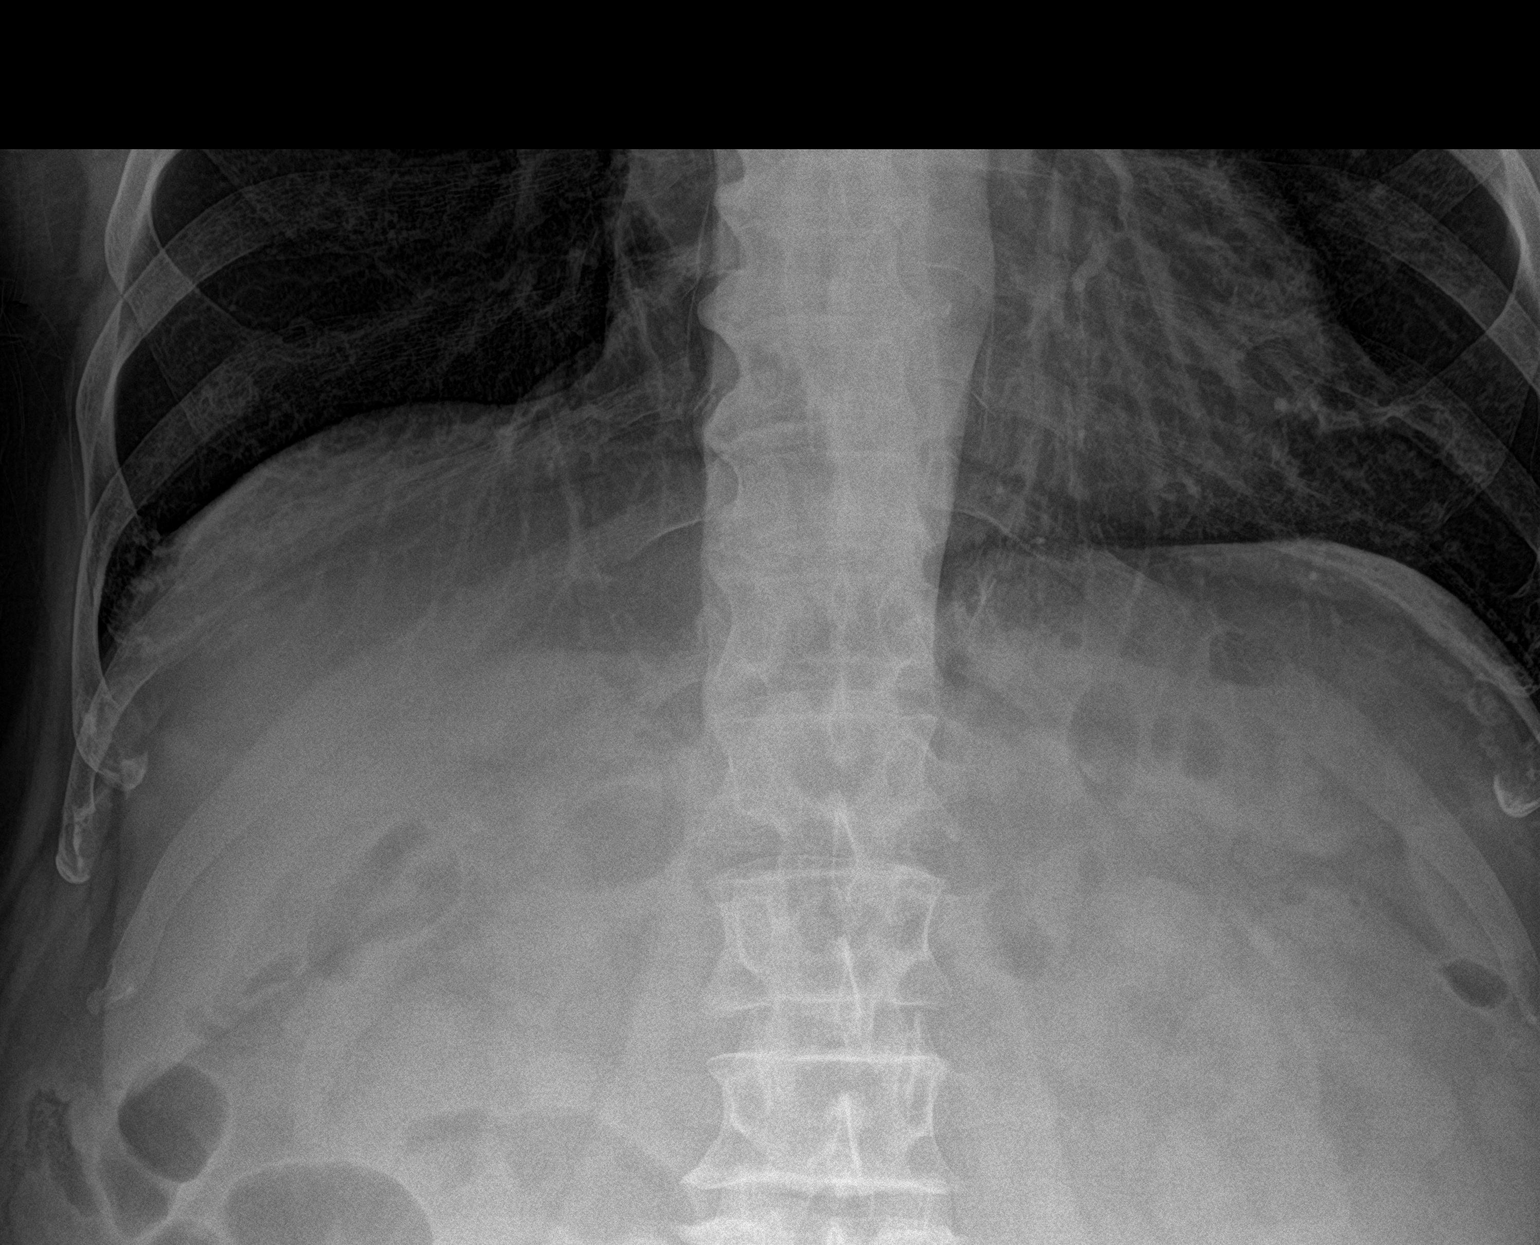

[2 of 2 positions shown; findings below may reference images not displayed]

FINDINGS: Calcifications projecting just superior the right ilium correlate
with stones in the lower pole of the right kidney seen on prior CT.
No ureteral stone is seen on plain film. No evidence of left urinary
tract calculi. Bowel gas pattern is normal.
IMPRESSION: No change in right renal stones.

## 2021-08-10 DIAGNOSIS — M9901 Segmental and somatic dysfunction of cervical region: Secondary | ICD-10-CM | POA: Diagnosis not present

## 2021-08-10 DIAGNOSIS — S39012A Strain of muscle, fascia and tendon of lower back, initial encounter: Secondary | ICD-10-CM | POA: Diagnosis not present

## 2021-08-10 DIAGNOSIS — S161XXA Strain of muscle, fascia and tendon at neck level, initial encounter: Secondary | ICD-10-CM | POA: Diagnosis not present

## 2021-08-10 DIAGNOSIS — M546 Pain in thoracic spine: Secondary | ICD-10-CM | POA: Diagnosis not present

## 2021-08-10 DIAGNOSIS — M9902 Segmental and somatic dysfunction of thoracic region: Secondary | ICD-10-CM | POA: Diagnosis not present

## 2021-08-10 DIAGNOSIS — M9903 Segmental and somatic dysfunction of lumbar region: Secondary | ICD-10-CM | POA: Diagnosis not present

## 2021-08-19 DIAGNOSIS — G4731 Primary central sleep apnea: Secondary | ICD-10-CM | POA: Diagnosis not present

## 2021-09-12 DIAGNOSIS — K219 Gastro-esophageal reflux disease without esophagitis: Secondary | ICD-10-CM | POA: Diagnosis not present

## 2021-09-12 DIAGNOSIS — E1165 Type 2 diabetes mellitus with hyperglycemia: Secondary | ICD-10-CM | POA: Diagnosis not present

## 2021-09-12 DIAGNOSIS — N200 Calculus of kidney: Secondary | ICD-10-CM | POA: Diagnosis not present

## 2021-09-12 DIAGNOSIS — N401 Enlarged prostate with lower urinary tract symptoms: Secondary | ICD-10-CM | POA: Diagnosis not present

## 2021-09-12 DIAGNOSIS — I10 Essential (primary) hypertension: Secondary | ICD-10-CM | POA: Diagnosis not present

## 2021-09-12 DIAGNOSIS — E7800 Pure hypercholesterolemia, unspecified: Secondary | ICD-10-CM | POA: Diagnosis not present

## 2021-10-12 ENCOUNTER — Ambulatory Visit (INDEPENDENT_AMBULATORY_CARE_PROVIDER_SITE_OTHER): Payer: Medicare PPO

## 2021-10-12 DIAGNOSIS — I441 Atrioventricular block, second degree: Secondary | ICD-10-CM

## 2021-10-14 LAB — CUP PACEART REMOTE DEVICE CHECK
Battery Remaining Longevity: 108 mo
Battery Remaining Percentage: 88 %
Battery Voltage: 2.99 V
Brady Statistic AP VP Percent: 12 %
Brady Statistic AP VS Percent: 1 %
Brady Statistic AS VP Percent: 87 %
Brady Statistic AS VS Percent: 1 %
Brady Statistic RA Percent Paced: 11 %
Brady Statistic RV Percent Paced: 99 %
Date Time Interrogation Session: 20230704020015
Implantable Lead Implant Date: 20220401
Implantable Lead Implant Date: 20220401
Implantable Lead Location: 753859
Implantable Lead Location: 753860
Implantable Lead Model: 3830
Implantable Pulse Generator Implant Date: 20220401
Lead Channel Impedance Value: 510 Ohm
Lead Channel Impedance Value: 540 Ohm
Lead Channel Pacing Threshold Amplitude: 0.5 V
Lead Channel Pacing Threshold Amplitude: 1 V
Lead Channel Pacing Threshold Pulse Width: 0.4 ms
Lead Channel Pacing Threshold Pulse Width: 0.4 ms
Lead Channel Sensing Intrinsic Amplitude: 12 mV
Lead Channel Sensing Intrinsic Amplitude: 2.1 mV
Lead Channel Setting Pacing Amplitude: 1.25 V
Lead Channel Setting Pacing Amplitude: 2 V
Lead Channel Setting Pacing Pulse Width: 0.4 ms
Lead Channel Setting Sensing Sensitivity: 2 mV
Pulse Gen Model: 2272
Pulse Gen Serial Number: 3912376

## 2021-10-18 DIAGNOSIS — N401 Enlarged prostate with lower urinary tract symptoms: Secondary | ICD-10-CM | POA: Diagnosis not present

## 2021-10-18 DIAGNOSIS — I10 Essential (primary) hypertension: Secondary | ICD-10-CM | POA: Diagnosis not present

## 2021-10-18 DIAGNOSIS — K219 Gastro-esophageal reflux disease without esophagitis: Secondary | ICD-10-CM | POA: Diagnosis not present

## 2021-10-18 DIAGNOSIS — N200 Calculus of kidney: Secondary | ICD-10-CM | POA: Diagnosis not present

## 2021-10-18 DIAGNOSIS — E1165 Type 2 diabetes mellitus with hyperglycemia: Secondary | ICD-10-CM | POA: Diagnosis not present

## 2021-10-18 DIAGNOSIS — E7800 Pure hypercholesterolemia, unspecified: Secondary | ICD-10-CM | POA: Diagnosis not present

## 2021-10-18 DIAGNOSIS — I471 Supraventricular tachycardia: Secondary | ICD-10-CM | POA: Diagnosis not present

## 2021-10-25 ENCOUNTER — Encounter: Payer: Self-pay | Admitting: Internal Medicine

## 2021-10-25 ENCOUNTER — Ambulatory Visit (INDEPENDENT_AMBULATORY_CARE_PROVIDER_SITE_OTHER): Payer: Medicare PPO | Admitting: Internal Medicine

## 2021-10-25 VITALS — BP 138/66 | HR 67 | Temp 98.0°F | Ht 72.0 in | Wt 194.0 lb

## 2021-10-25 DIAGNOSIS — E7800 Pure hypercholesterolemia, unspecified: Secondary | ICD-10-CM | POA: Diagnosis not present

## 2021-10-25 DIAGNOSIS — N1831 Chronic kidney disease, stage 3a: Secondary | ICD-10-CM | POA: Diagnosis not present

## 2021-10-25 DIAGNOSIS — E559 Vitamin D deficiency, unspecified: Secondary | ICD-10-CM

## 2021-10-25 DIAGNOSIS — E1165 Type 2 diabetes mellitus with hyperglycemia: Secondary | ICD-10-CM | POA: Diagnosis not present

## 2021-10-25 DIAGNOSIS — I10 Essential (primary) hypertension: Secondary | ICD-10-CM

## 2021-10-25 LAB — LIPID PANEL
Cholesterol: 107 mg/dL (ref 0–200)
HDL: 42.9 mg/dL (ref >39.00)
LDL Cholesterol: 49 mg/dL (ref 0–99)
NonHDL: 64.51
Total CHOL/HDL Ratio: 3
Triglycerides: 79 mg/dL (ref 0.0–149.0)
VLDL: 15.8 mg/dL (ref 0.0–40.0)

## 2021-10-25 LAB — HEPATIC FUNCTION PANEL
ALT: 16 U/L (ref 0–53)
AST: 20 U/L (ref 0–37)
Albumin: 4.6 g/dL (ref 3.5–5.2)
Alkaline Phosphatase: 72 U/L (ref 39–117)
Bilirubin, Direct: 0.1 mg/dL (ref 0.0–0.3)
Total Bilirubin: 0.7 mg/dL (ref 0.2–1.2)
Total Protein: 7 g/dL (ref 6.0–8.3)

## 2021-10-25 LAB — HEMOGLOBIN A1C: Hgb A1c MFr Bld: 5.8 % (ref 4.6–6.5)

## 2021-10-25 LAB — BASIC METABOLIC PANEL
BUN: 27 mg/dL — ABNORMAL HIGH (ref 6–23)
CO2: 27 mEq/L (ref 19–32)
Calcium: 9.7 mg/dL (ref 8.4–10.5)
Chloride: 105 mEq/L (ref 96–112)
Creatinine, Ser: 1.25 mg/dL (ref 0.40–1.50)
GFR: 55.83 mL/min — ABNORMAL LOW (ref >60.00)
Glucose, Bld: 104 mg/dL — ABNORMAL HIGH (ref 70–99)
Potassium: 3.8 mEq/L (ref 3.5–5.1)
Sodium: 140 mEq/L (ref 135–145)

## 2021-10-25 LAB — VITAMIN D 25 HYDROXY (VIT D DEFICIENCY, FRACTURES): VITD: 66.4 ng/mL (ref 30.00–100.00)

## 2021-10-25 NOTE — Assessment & Plan Note (Signed)
Last vitamin D Lab Results  Component Value Date   VD25OH 38.56 11/16/2020   Low, to start oral replacement

## 2021-10-25 NOTE — Progress Notes (Signed)
Patient ID: Grant Bowers, male   DOB: [birth year], 76 y.o.   MRN: [id number]        Chief Complaint: follow up HTN, HLD and hyperglycemia, low vit d, ckd       HPI:  Grant Bowers is a 76 y.o. male here overall doing ok.  Pt denies chest pain, increased sob or doe, wheezing, orthopnea, PND, increased LE swelling, palpitations, dizziness or syncope.  Pt denies polydipsia, polyuria, or new focal neuro s/s.    Pt denies fever, wt loss, night sweats, loss of appetite, or other constitutional symptoms  Peak was 216 in past, now more recently on rybelsus with wt loss (no metfromin).  Seeing new endo in [place].  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   [place];   followed by [facility] cardiology--- dr Caryl Comes   ED (erectile dysfunction)    GERD (gastroesophageal reflux disease)    History of 2019 novel coronavirus disease (COVID-19) 05/07/2020   per pt mild symptoms all resolved with exception DOE   History of cardiac murmur as a child    History of kidney stones    Hyperlipidemia    Hypertension    followed by pcp and cardiology   Hypogonadism male    Mild atherosclerosis of both carotid arteries 09/23/2015   duplex doppler in epic bilateral ICA 1-39%   OSA on CPAP    per pt uses nightly   Post-COVID chronic dyspnea    had covid 05-07-2020;  per pt never had issue prior to covid ;  PFT 10-19-2020 normal per dr c. young   Right ureteral calculus 02/11/2010   Trifascicular block    alternating with BBB per dr Caryl Comes note;  s/p PPM 07-09-2020;   echo 07-29-2020 mild LVH, G1DD, ef 65-70%, moderate LAE, mild MR without stenosis    Past Surgical History:  Procedure Laterality Date   CARDIAC CATHETERIZATION  2009   per pt in [place] , told was normal   CARDIAC CATHETERIZATION  02/17/2010   '@[facility]'$   by dr Angelena Form--- no angiographic evidence cad, normal lvsf,  false positive stress test   COLONOSCOPY  12/07/2006   [facility] in [place], URETEROSCOPY AND STENT PLACEMENT Right 10/29/2020   Procedure: CYSTOSCOPY WITH RETROGRADE PYELOGRAM, URETEROSCOPY AND STENT PLACEMENT;  Surgeon: Franchot Gallo, MD;  Location: [facility];  Service: Urology;  Laterality: Right;   CYSTOSCOPY WITH RETROGRADE PYELOGRAM, URETEROSCOPY AND STENT PLACEMENT Right 11/09/2020   Procedure: CYSTOSCOPY WITH RIGHT  RETROGRADE PYELOGRAM, URETEROSCOPY AND STENT EXHANGE;  Surgeon: Remi Haggard, MD;  Location: [facility];  Service: Urology;  Laterality: Right;  30 MINS   CYSTOSCOPY WITH URETEROSCOPY, STONE BASKETRY AND STENT PLACEMENT  2012   EXTRACORPOREAL SHOCK WAVE LITHOTRIPSY Right 04/29/2020   Procedure: EXTRACORPOREAL SHOCK WAVE LITHOTRIPSY (ESWL);  Surgeon: Robley Fries, MD;  Location: [facility];  Service: Urology;  Laterality: Right;   HOLMIUM LASER APPLICATION Right 05/17/9483   Procedure: HOLMIUM LASER APPLICATION;  Surgeon:  Remi Haggard, MD;  [facility];  Service: Urology;  Laterality: Right;   KNEE ARTHROSCOPY Left 2008   PACEMAKER IMPLANT N/A 07/09/2020   Procedure: PACEMAKER IMPLANT;  Surgeon: Deboraha Sprang, MD;  Location: [facility];  Service: Cardiovascular;  Laterality: N/A;    reports that he has never smoked. He has never used smokeless tobacco. He reports current alcohol use. He reports that he does not use drugs. family history includes Colon polyps in his brother; Diabetes in his brother and father; Pancreatic cancer in his mother. No Known Allergies Current Outpatient Medications on File Prior to Visit  Medication Sig  Dispense Refill   Alcohol Swabs (B-D SINGLE USE SWABS REGULAR) PADS Use as directed once per day E11.9 100 each 12   amLODipine (NORVASC) 10 MG tablet TAKE 1 TABLET EVERY DAY 90 tablet 1   aspirin EC 81 MG tablet Take 81 mg by mouth daily. Swallow whole.     Blood Glucose Calibration (TRUE METRIX LEVEL 1) Low SOLN Use as directed once per day E11.9 1 each 1   cetirizine (ZYRTEC) 10 MG tablet TAKE 1 TABLET EVERY DAY 90 tablet 1   Cholecalciferol (VITAMIN D3 SUPER STRENGTH) 50 MCG (2000 UT) TABS Take 1 tablet by mouth daily.     fluticasone (FLONASE) 50 MCG/ACT nasal spray USE 2 SPRAYS IN EACH NOSTRIL EVERY DAY (Patient taking differently: 1 spray as needed.) 48 g 3   glucose blood (TRUE METRIX BLOOD GLUCOSE TEST) test strip TEST BLOOD SUGAR TWICE DAILY AS INSTRUCTED 200 strip 3   irbesartan (AVAPRO) 300 MG tablet TAKE 1 TABLET AT BEDTIME 90 tablet 1   Multiple Vitamins-Minerals (CENTRUM ADULTS) TABS Take 1 tablet by mouth daily.     omeprazole (PRILOSEC) 10 MG capsule TAKE 1 CAPSULE EVERY DAY 90 capsule 1   rosuvastatin (CRESTOR) 20 MG tablet TAKE 1 TABLET EVERY DAY ( ANNUAL APPOINTMENT DUE IN JULY MUST SEE PROVIDER FOR FURTHER REFILLS ) 90 tablet 0   Semaglutide (RYBELSUS) 3 MG TABS Take 3 mg by mouth daily. 90 tablet 3   sildenafil (REVATIO) 20 MG tablet 3-5 tabs by mouth every other day as needed 60 tablet 2   tamsulosin (FLOMAX) 0.4 MG CAPS capsule TAKE 1 CAPSULE EVERY DAY 90 capsule 3   TRUEplus Lancets 33G MISC Use as directed once per day E11.9 100 each 12   ZINC PICOLINATE PO Take 1 tablet by mouth daily.     hydrochlorothiazide (MICROZIDE) 12.5 MG capsule Take 1 capsule (12.5 mg total) by mouth daily. 90 capsule 3   No current facility-administered medications on file prior to visit.        ROS:  All others reviewed and negative.  Objective        PE:  BP 138/66 (BP Location: Right Arm, Patient Position: Sitting, Cuff Size: Large)   Pulse 67   Temp 98 F (36.7 C) (Oral)   Ht 6'  (1.829 m)   Wt 194 lb (88 kg)   SpO2 98%   BMI 26.31 kg/m                 Constitutional: Pt appears in NAD               HENT: Head: NCAT.                Right Ear: External ear normal.                 Left  Ear: External ear normal.                Eyes: . Pupils are equal, round, and reactive to light. Conjunctivae and EOM are normal               Nose: without d/c or deformity               Neck: Neck supple. Gross normal ROM               Cardiovascular: Normal rate and regular rhythm.                 Pulmonary/Chest: Effort normal and breath sounds without rales or wheezing.                Abd:  Soft, NT, ND, + BS, no organomegaly               Neurological: Pt is alert. At baseline orientation, motor grossly intact               Skin: Skin is warm. No rashes, no other new lesions, LE edema - none               Psychiatric: Pt behavior is normal without agitation   Micro: none  Cardiac tracings I have personally interpreted today:  none  Pertinent Radiological findings (summarize): none   Lab Results  Component Value Date   WBC 7.2 11/16/2020   HGB 13.6 11/16/2020   HCT 39.2 11/16/2020   PLT 264.0 11/16/2020   GLUCOSE 104 (H) 10/25/2021   CHOL 107 10/25/2021   TRIG 79.0 10/25/2021   HDL 42.90 10/25/2021   LDLDIRECT 150.3 02/11/2010   LDLCALC 49 10/25/2021   ALT 16 10/25/2021   AST 20 10/25/2021   NA 140 10/25/2021   K 3.8 10/25/2021   CL 105 10/25/2021   CREATININE 1.25 10/25/2021   BUN 27 (H) 10/25/2021   CO2 27 10/25/2021   TSH 1.59 03/15/2021   PSA 4.80 (H) 06/01/2020   INR 1.2 ratio (H) 02/15/2010   HGBA1C 5.8 10/25/2021   Assessment/Plan:  Grant Bowers is a 76 y.o. White or Caucasian [1] male with  has a past medical history of BPH with elevated PSA, Cardiac pacemaker in situ (07/09/2020), ED (erectile dysfunction), GERD (gastroesophageal reflux disease), History of 2019 novel coronavirus disease (COVID-19) (05/07/2020), History of cardiac murmur as a  child, History of kidney stones, Hyperlipidemia, Hypertension, Hypogonadism male, Mild atherosclerosis of both carotid arteries (09/23/2015), OSA on CPAP, Post-COVID chronic dyspnea, Right ureteral calculus (02/11/2010), and Trifascicular block.  Vitamin D deficiency Last vitamin D Lab Results  Component Value Date   VD25OH 38.56 11/16/2020   Low, to start oral replacement   Hypertension, uncontrolled BP Readings from Last 3 Encounters:  10/25/21 138/66  07/15/21 130/70  05/16/21 (!) 184/80   Stable, pt to continue medical treatment norvasc 10 mg qd, avapro 300 qd, and hct 12. 5 qd   Hyperlipidemia Lab Results  Component Value Date   LDLCALC 49 10/25/2021   Stable, pt to continue current statin crestor 20 mg qd   Diabetes ([place]) Lab Results  Component Value Date   HGBA1C 5.8 10/25/2021   Stable, pt to continue current medical treatment rybelsus 3 mg   CKD (chronic kidney disease) Lab Results  Component Value Date   CREATININE 1.25 10/25/2021   Stable overall, cont to avoid nephrotoxins  Followup: Return in about 6 months (around 04/27/2022).  Cathlean Cower, MD 10/29/2021 2:29  PM [facility]

## 2021-10-25 NOTE — Patient Instructions (Signed)

## 2021-10-29 ENCOUNTER — Encounter: Payer: Self-pay | Admitting: Internal Medicine

## 2021-10-29 NOTE — Assessment & Plan Note (Signed)
Lab Results  Component Value Date   CREATININE 1.25 10/25/2021   Stable overall, cont to avoid nephrotoxins

## 2021-10-29 NOTE — Assessment & Plan Note (Signed)
BP Readings from Last 3 Encounters:  10/25/21 138/66  07/15/21 130/70  05/16/21 (!) 184/80   Stable, pt to continue medical treatment norvasc 10 mg qd, avapro 300 qd, and hct 12. 5 qd

## 2021-10-29 NOTE — Assessment & Plan Note (Signed)
Lab Results  Component Value Date   LDLCALC 49 10/25/2021   Stable, pt to continue current statin crestor 20 mg qd  

## 2021-10-29 NOTE — Assessment & Plan Note (Signed)
Lab Results  Component Value Date   HGBA1C 5.8 10/25/2021   Stable, pt to continue current medical treatment rybelsus 3 mg

## 2021-11-02 NOTE — Progress Notes (Signed)
Remote pacemaker transmission.   

## 2021-12-07 ENCOUNTER — Other Ambulatory Visit: Payer: Self-pay | Admitting: Internal Medicine

## 2021-12-07 NOTE — Telephone Encounter (Signed)
Please refill as per office routine med refill policy (all routine meds to be refilled for 3 mo or monthly (per pt preference) up to one year from last visit, then month to month grace period for 3 mo, then further med refills will have to be denied) ? ?

## 2021-12-08 DIAGNOSIS — R3912 Poor urinary stream: Secondary | ICD-10-CM | POA: Diagnosis not present

## 2021-12-08 DIAGNOSIS — R3121 Asymptomatic microscopic hematuria: Secondary | ICD-10-CM | POA: Diagnosis not present

## 2021-12-08 DIAGNOSIS — N401 Enlarged prostate with lower urinary tract symptoms: Secondary | ICD-10-CM | POA: Diagnosis not present

## 2021-12-08 DIAGNOSIS — N130 Hydronephrosis with ureteropelvic junction obstruction: Secondary | ICD-10-CM | POA: Diagnosis not present

## 2021-12-15 DIAGNOSIS — N281 Cyst of kidney, acquired: Secondary | ICD-10-CM | POA: Diagnosis not present

## 2021-12-15 DIAGNOSIS — N132 Hydronephrosis with renal and ureteral calculous obstruction: Secondary | ICD-10-CM | POA: Diagnosis not present

## 2021-12-15 DIAGNOSIS — N130 Hydronephrosis with ureteropelvic junction obstruction: Secondary | ICD-10-CM | POA: Diagnosis not present

## 2021-12-15 DIAGNOSIS — R3121 Asymptomatic microscopic hematuria: Secondary | ICD-10-CM | POA: Diagnosis not present

## 2021-12-16 ENCOUNTER — Other Ambulatory Visit: Payer: Self-pay | Admitting: Urology

## 2021-12-19 ENCOUNTER — Encounter (HOSPITAL_COMMUNITY): Payer: Self-pay | Admitting: Urology

## 2021-12-19 ENCOUNTER — Other Ambulatory Visit: Payer: Self-pay

## 2021-12-19 ENCOUNTER — Encounter: Payer: Self-pay | Admitting: Internal Medicine

## 2021-12-19 NOTE — Progress Notes (Signed)
Anesthesia Chart Review   Case: [id number] Date/Time: 12/20/21 0715   Procedure: CYSTOSCOPY/URETEROSCOPY/HOLMIUM LASER/STENT PLACEMENT (Right) - 29 MINS   Anesthesia type: General   Pre-op diagnosis: URETERAL CALCULUS   Location: [facility] ROOM 04 / [facility]   Surgeons: Remi Haggard, MD       DISCUSSION:.never smoker with h/o GERD, HTN, OSA on CPAP, pacemaker in place (device orders in 12/19/2021 progress note), DM II, ureteral calculus scheduled for above procedure 12/20/2021 with Dr. Harold Barban.   Pt last seen by cardiology 07/15/2021, stable at this year with 1 year follow up recommended.   Anticipate pt can proceed with planned procedure barring acute status change.   VS: Ht 6' (1.829 m)   Wt 86.2 kg   BMI 25.77 kg/m   PROVIDERS: Biagio Borg, MD is PCP   Thompson Grayer, MD is Cardiologist  LABS: Labs reviewed: Acceptable for surgery. (all labs ordered are listed, but only abnormal results are displayed)  Labs Reviewed - No data to display   IMAGES:   EKG:   CV: Echo 07/29/20 1. Left ventricular ejection fraction, by estimation, is 65 to 70%. The  left ventricle has normal function. The left ventricle has no regional  wall motion abnormalities. There is mild left ventricular hypertrophy.  Left ventricular diastolic parameters  are consistent with Grade I diastolic dysfunction (impaired relaxation).  The average left ventricular global longitudinal strain is 24.0 %. The  global longitudinal strain is normal.   2. Right ventricular systolic function is normal. The right ventricular  size is normal.   3. Left atrial size was moderately dilated.   4. The pericardial effusion is circumferential.   5. The mitral valve is normal in structure. Mild mitral valve  regurgitation. No evidence of mitral stenosis.   6. The aortic valve is tricuspid. Aortic valve regurgitation is not  visualized. No aortic stenosis is present.   7. The inferior vena cava is normal in size with  greater than 50%  respiratory variability, suggesting right atrial pressure of 3 mmHg.  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   [facility];   followed by [facility] cardiology--- dr Caryl Comes   Diabetes mellitus without complication ([place])    Dyspnea due to COVID-19    with exertion   ED (erectile dysfunction)    GERD (gastroesophageal reflux disease)    History of 2019 novel coronavirus disease (COVID-19) 05/07/2020   per pt mild symptoms all resolved with exception DOE   History of cardiac murmur as a child    History of kidney stones    Hyperlipidemia    Hypertension    followed by pcp and cardiology   Hypogonadism male    Mild atherosclerosis of both carotid arteries 09/23/2015   duplex doppler in epic bilateral ICA 1-39%   OSA on CPAP    per pt uses nightly   Post-COVID chronic dyspnea    had covid 05-07-2020;  per pt never had issue prior to covid ;  PFT 10-19-2020 normal per dr c. young   Right ureteral calculus 02/11/2010   Skin cancer    Calf   Trifascicular block    alternating with BBB per dr Caryl Comes note;  s/p PPM 07-09-2020;   echo 07-29-2020 mild LVH, G1DD, ef 65-70%, moderate LAE, mild MR without stenosis    Past Surgical History:  Procedure Laterality  Date   CARDIAC CATHETERIZATION  2009   [place] in [place] , told was [place]   CARDIAC CATHETERIZATION  02/17/2010   '@[facility]'$   by dr Angelena Form--- no angiographic evidence cad, normal lvsf,  false positive stress test   COLONOSCOPY  12/07/2006   [facility] in [place], URETEROSCOPY AND STENT PLACEMENT Right 10/29/2020   Procedure: CYSTOSCOPY WITH RETROGRADE PYELOGRAM, URETEROSCOPY AND STENT PLACEMENT;  Surgeon: Franchot Gallo, MD;  Location: [facility];  Service: Urology;  Laterality: Right;   CYSTOSCOPY WITH RETROGRADE PYELOGRAM, URETEROSCOPY AND STENT PLACEMENT Right  11/09/2020   Procedure: CYSTOSCOPY WITH RIGHT  RETROGRADE PYELOGRAM, URETEROSCOPY AND STENT EXHANGE;  Surgeon: Remi Haggard, MD;  Location: [facility];  Service: Urology;  Laterality: Right;  30 MINS   CYSTOSCOPY WITH URETEROSCOPY, STONE BASKETRY AND STENT PLACEMENT  2012   EXTRACORPOREAL SHOCK WAVE LITHOTRIPSY Right 04/29/2020   Procedure: EXTRACORPOREAL SHOCK WAVE LITHOTRIPSY (ESWL);  Surgeon: Robley Fries, MD;  Location: [facility];  Service: Urology;  Laterality: Right;   HOLMIUM LASER APPLICATION Right 04/15/1094   Procedure: HOLMIUM LASER APPLICATION;  Surgeon: Remi Haggard, MD;  Location: [facility];  Service: Urology;  Laterality: Right;   KNEE ARTHROSCOPY Left 2008   PACEMAKER IMPLANT N/A 07/09/2020   Procedure: PACEMAKER IMPLANT;  Surgeon: Deboraha Sprang, MD;  Location: [facility];  Service: Cardiovascular;  Laterality: N/A;    MEDICATIONS: No current facility-administered medications for this encounter.    alfuzosin (UROXATRAL) 10 MG 24 hr tablet   amLODipine (NORVASC) 10 MG tablet   aspirin EC 81 MG tablet   cetirizine (ZYRTEC) 10 MG tablet   Cholecalciferol (VITAMIN D3 SUPER STRENGTH) 50 MCG (2000 UT) TABS   hydrochlorothiazide (MICROZIDE) 12.5 MG capsule   irbesartan (AVAPRO) 300 MG tablet   metFORMIN (GLUCOPHAGE-XR) 500 MG 24 hr tablet   omeprazole (PRILOSEC) 10 MG capsule   rosuvastatin (CRESTOR) 20 MG tablet   zinc gluconate 50 MG tablet   Alcohol Swabs (B-D SINGLE USE SWABS REGULAR) PADS   Blood Glucose Calibration (TRUE METRIX LEVEL 1) Low SOLN   fluticasone (FLONASE) 50 MCG/ACT nasal spray   glucose blood (TRUE METRIX BLOOD GLUCOSE TEST) test strip   Semaglutide (RYBELSUS) 3 MG TABS   sildenafil (REVATIO) 20 MG tablet   TRUEplus Lancets 33G MISC    Tayli Buch Ward, PA-C [facility] [phone]

## 2021-12-19 NOTE — H&P (Signed)
Grant Bowers is a 76 year-old male established patient who is here for follow up for further evaluation of his elevated PSA.   His last PSA was performed 09/12/2018. The last PSA value was 5.42.   He has had a prostate biopsy. Patient does not have a family history of prostate cancer. The patient states he does not take 5 alpha reductase inhibitor medication.   He has not recently had unwanted weight loss. He is not having new bone pain. This condition would be considered of mild to moderate severity with no modifying factors or associated signs or symptoms other than as noted above.   08/22/16: He was found to have an elevated PSA of 5.41 on 08/09/16. He has been on testosterone injections monthly for hypogonadism. He was recently found to have a low serum testosterone and was told to increase his testosterone injections to every 2 weeks.   03/06/17: He reports that he has not had any change in his voiding pattern. He does have erectile dysfunction and is currently using Cialis that he gets from [place].   03/14/18: He continues to do well with no new urologic complaints. Specifically no voiding difficulties. He uses Cialis in this remains effective for his erectile dysfunction.   10/03/18: He returns today for follow-up of an elevated PSA and has been doing well from urologic standpoint. He continues to use Cialis at a 20 mg dose. No new voiding complaints are noted. No bone pain or unexplained weight loss.  -10/30/19-Patient history of BPH and marginal PSA elevation as above. Been doing well. Remains on tamsulosin 0.4 mg daily and voiding well. Most recent PSA now down to 4.27 on 10/06/2019. This is down from 4.49 in December of 2020. Patient has had negative prostate biopsy on 11/16/2014. Patient continues to take Cialis as needed for erectile dysfunction.  -04/19/20-patient with history of elevated PSA, BPH and nephrolithiasis and erectile dysfunction. Patient had recent PSA 4.27 on 10/06/2019. Repeat  PSA on 04/17/2020 is now 3.97. Baseline values run between 4 and 6.   Second issue is that of erectile dysfunction. Patient has used sildenafil with some limited success in the past. He was interested in looking at information regarding vacuum erection device that he had researched online. I gave him web site for [employer] [facility] to look at the devices.   Patient has had history of nephrolithiasis and had recent episode of back pain with associated hematuria after playing off last week. Currently minimally symptomatic.  KUB is reviewed today and shows several calcifications ranging in the 4-5 mm range overlying the lower caliceal area of the right kidney. There is also a probable 7 mm calcification in the region of the right upper ureter/UPJ area. No obvious distal stone seen on the right and no obvious stones on the left. No bony abnormalities noted.  Micro urinalysis today shows 3-10 RBCs.  CT urogram was obtained and by initial review shows: What appears to be probable parapelvic cyst region the right kidney with 7-8 mm calcification at the upper ureter/UPJ area with perhaps mild hydro noted. There is also a cluster of small stones in the right lower caliceal area. Over read is pending  -05/21/20-patient with history of 7 mm right upper ureteral calculus. Underwent ESL on 05/01/2020 by Dr. pace. Passed some blood but no definitive stone fragments. He is currently pain-free. Here for evaluation and management post lithotripsy.  KUB is reviewed today shows persistent calcification in the previously treated area but the stone looks significantly fragmented  and splayed consistent with good fragmentation.   06/04/2020: Returns today for follow-up exam. He denies interval stone material passage. He has had no recurrence of pain/discomfort. Voiding at his baseline with stable, grossly non bothersome symptomatology. KUB today demonstrate the continued presence of a fragmented right ureteral calculi.  Cluster of  stone material remains in a fragmented/hazy pattern along the right proximal ureter but grossly unchanged in location since time of last office visit exam.   Fortunately the patient remains asymptomatic and is voiding at his baseline. We discussed repeat treatment options including shockwave lithotripsy as well as ureteroscopy. He understands risks versus benefits and success chances with both procedures. Ultimately he would prefer shockwave lithotripsy. I will discuss this with his urologist and then have the patient contacted schedule appropriately. He will continue tamsulosin in the interval and also continue to observe for any interval stone material passage. In the event this occurs he will contact the office; with any new or worsening symptomatology including uncontrollable pain/discomfort or worsening lower urinary tract symptoms he will follow up accordingly as well.    -10/28/20-patient with history of right upper ureteral calculus underwent in situ ESL on 05/01/2020 with follow-up showing some persistent calcification in the region of the treated area. This was back on 06/04/2020. The patient was scheduled for stage retreat of the stone however when he was on the lithotripsy treatment truck was noted to have bradycardia in the procedure was canceled. Subsequently seen by Cardiology and he ended up getting a pacemaker. He has not passed a definitive stone. He has over the last several days had some lower abdominal discomfort but no flank pain or hematuria or fever. Also had some constipation issues. Noted some black color to the stool over the last couple of days after taking 800 mg of ibuprofen for the pain. Here now for evaluation for possible persistent stone.  Micro urinalysis today shows 20-40 RBCs  KUB is reviewed today shows: No obvious bony abnormalities. There is a calcification region of L3 transverse process on the right which measures about 8 mm which could be some fragments Ender  congealed in this area versus a solid stone. No obvious distal stones noted or stones overlying the renal contours.  CT urogram was obtained in by initial review shows what appears to be a fragment at the outlet at the UPJ area on the right and I think some parapelvic cysts which been present on prior CT scan. Following the ureter there is what appears to be approximate 8-9 mm calcification or conglomeration of Steinstrasse perhaps at the crossing of the iliac vessel on the right. Distal to this point ureter is normal caliber down to the bladder. Over read is pending  -11/18/20-patient with history of right ureteral calculi and right flank pain as above. Subsequent underwent cysto right ureteroscopy and right pyeloscopy and laser lithotripsy and stone extraction on 11/09/2020. All of the stones were removed Has indwelling stent. Now here for stent removal.  Cysto and JJ stent removal was accomplished today without difficulty.   -06/03/21-patient with history of right ureteral calculus requiring ureteroscopy and pyeloscopy and laser lithotripsy in August 2022 as above. No interim stone issues. Here for follow-up KUB and ultrasound.  Micro urinalysis  Renal ultrasound is reviewed today and shows: Multiple cysts overlying the right kidney and probable 8 mm nonobstructing stone noted.  KUB is reviewed today shows: No obvious bony abnormality. There is a probable 5 to 6 mm density overlying the lateral portion of the right  renal shadow suggestive of recurrent stone. No obvious ureteral calculi noted  -12/08/21-patient with history of renal cysts and known right renal calculus. Has undergone right ureteroscopy and pyeloscopy and laser lithotripsy in August 22. Had follow-up KUB and ultrasound in February 2023 showing nonobstructing right renal calculus and right renal cyst. Here now for 46-monthfollow-up KUB and ultrasound. No interim stone issues.  Micro urinalysis shows 10-20 RBCs  KUB is reviewed today and  shows: No obvious bony abnormality. The previously seen calcification overlying the right renal shadow is no longer present. I do not see any calcification overlying the expected course of the right ureter.  Renal ultrasound is reviewed and shows: What appears to be significant back pressure effect and hydronephrosis. Previously seen cyst is difficult to delineate likely because of the overlying hydronephrosis of the right kidney.      ALLERGIES: Atorvastatin Calcium TABS    MEDICATIONS: Cialis 20 mg tablet 1 tablet PO Every Other Day PRN  Amlodipine Besylate 10 mg tablet Oral  Aspirin 81 MG TABS Oral  Avapro 300 MG Oral Tablet Oral  Cialis 5 mg tablet 1 Oral  Fluticasone Propionate 50 mcg/actuation spray, suspension Nasal  Omeprazole 40 MG Oral Capsule Delayed Release Oral  Rybelsus 3 mg tablet  Simvastatin 20 MG Oral Tablet Oral  Vitamin D  Zinc     GU PSH: Cysto Remove Stent FB Sim - 11/18/2020 Cystoscopy - 2018 ESWL, Right - 2022 Locm 300-'399Mg'$ /Ml Iodine,1Ml - 12/15/2021, 2018 Prostate Needle Biopsy - 2016 Ureteroscopic laser litho - 11/09/2020 Ureteroscopic stone removal - 2016       PSH Notes: Biopsy Of The Prostate Needle, Cystoscopy With Ureteroscopy With Removal Of Calculus   NON-GU PSH: Pacemaker placement - 07/09/2020     GU PMH: Hydronephrosis - 12/15/2021 Microscopic hematuria - 12/15/2021, (Stable), - 2022 (Stable), I have discussed with the patient the fact that the CT scan has revealed no abnormality of the upper tract that could contribute to the presence of microscopic hematuria and cystoscopically no abnormality of the lower tract was identified. In this case current recommendations are that the patient be followed for microscopic hematuria for 3 - 5 years with urinalysis and serum creatinine on a yearly basis. If the urine remains positive for microscopic hematuria and the patient develops new hypertension, proteinuria or blood cell casts on urinalysis evaluation for  primary renal disease should be undertaken. If, on the other hand, microscopic hematuria persists in the absence of any findings to suggest primary renal disease, then repeat urologic evaluation should be considered. In addition if the patient has 2 consecutive negative urinalyses yearly over a two-year period then no further urinalyses for the purpose of evaluation of asymptomatic microscopic hematuria are necessary., - 2018, I have discussed with the patient that the evaluation of asymptomatic microscopic hematuria consists of excluding benign causes including menstruation in women as well as, in both sexes, vigorous exercise, sexual activity, viral illnesses, trauma and infection. If these factors are negative and the urinalysis reveals significant proteinuria, dysmorphic red blood cells or red cell casts and/or an elevated serum creatinine then evaluation by a nephrologist is indicated. If conditions suggestive of primary renal disease are not present and the patient has a low risk of urothelial malignancy consisting of age less than 40 years, no smoking history, no history of chemical exposure, no irritative voiding symptoms, no history of gross hematuria and no history of urologic disorder or disease then upper tract imaging should be undertaken with a CT scan  and consideration of cystoscopy versus urine cytology as well. If, on the other hand the patient is at high risk then a complete evaluation should be undertaken including upper tract imaging with CT scan and cystoscopy and consideration of urine cytology as well., - 2018 Renal calculus, Right - 06/03/2021, Right, - 2022, Right, - 2022, Right, He was noted to have 3 right renal calculi in the lower pole causing no obstruction., - 2018, Kidney stone on right side, - 2014 Renal and ureteral calculus - 11/18/2020, - 2022 Ureteral calculus - 10/28/2020, - 2022, - 2022 BPH w/o LUTS (Stable) - 2022, (Stable), He has a markedly enlarged prostate by exam. He  does not have any significant voiding symptoms, - 2018, He does have BPH by exam. He does not have any significant obstructive voiding symptoms., - 2018 Elevated PSA - 2022, (Stable), He was again noted to have an enlarged prostate but has no nodularity or worrisome induration. In addition his PSA, although elevated at 5.42, maintains a good free to total ratio and actually has been quite stable over time., - 2020 (Improving), Her his prostate remains benign to exam and his most recent PSA was 4.42. I will check his PSA today and plan to see him back in 6 months., - 2019 (Stable), His PSA remains elevated but he has an excellent free to total ratio and it is currently lower that it is been. His prostate is enlarged and most likely accounts for most of his slight PSA elevation. I have recommended continued monitoring of his PSA every 6 months., - 2018 (Stable), We discussed the fact that he does have an elevated PSA and it has increased slightly. While this very likely could be due to the effects of his testosterone replacement I have recommended we monitor this a little bit more closely with a repeat PSA again in 3 months and then again in 6 months., - 2018 (Worsening), I have discussed with the patient the possible need for further evaluation of his elevated PSA. We have discussed the options which would be continued observation with serial DRE and PSAs versus proceeding with further evaluation at this time with TRUS/Bx. We have discussed the possible risk of progression and spread of prostate cancer if present currently as well as the fact that typically prostate cancer tends to be a relatively slow-growing form of cancer that typically would not progress significantly over the relative short period of time between serial examinations. We also have discussed proceeding at this time with a prostate biopsy and I therefore have gone over the procedure with him in detail. He is familiar with a prostate biopsy having  undergone one in the past. We have elected to proceed with close monitoring of his PSA since he had no worrisome findings on DRE and his PSA is currently just slightly above where it was in 6/16 when it was 5.1 with a previous negative prostate biopsy., - 2018, Elevated PSA, - 2016 BPH w/LUTS (Stable), He has known BPH. He has been taking tamsulosin 0.4 mg. - 2020 Hematospermia, I have reassured him of the fact that his hematospermia is almost certainly not due to prostate cancer or other significant pathology especially with clear urine and no pain. - 2020 Peyronies Disease, He has very mild Peyronie's disease. It is not impacting his ability to engage in intercourse. We have elected to continue to monitor this. - 2020 Urinary Urgency, He has developed some urgency and so I have suggested he try increasing his dosage of  tamsulosin to 0.8 mg and if this is helpful he is going to contact me for a new prescription. - 2020 Epididymitis - 2019 Orchitis - 2019 Renal cyst, Bilateral, He was found to have a 7 cm lobulated simple cyst in the right kidney with smaller bilateral cysts noted as well. These are of no clinical significance. 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 He required ureteroscopic extraction of a stone in 2012. CT scan in 4/14 revealed a calcification in the upper pole of the right kidney that was felt to either represent a small, nonobstructing renal calculus versus a vascular calcification.  He did undergo ureteroscopy in [place] in the past.  CT scan 5/18 - single 3 mm stone in the lower pole of the right kidney.   Renal cysts: CT scan without  contrast on 08/07/12 - lesions within the right kidney. CT scan with contrast that same day which revealed the lesions within the right kidney to be simple cysts. The study also indicated there was a calcification in the upper pole of the right kidney that was felt to represent either a small, nonobstructing renal calculus versus a vascular calcification.  CT scan 5/18 - bilateral renal cysts   Elevated PSA: He was found to have a PSA of 5.15 in 6/16 which was up from 4.41 the previous year. No family history of prostate cancer. No abnormality on DRE.  TRUS/BX 11/16/14: BPH only  PSAs:  12/12 - 3.04  12/13 - 3.44  5/15 - 4.41  6/16 - 5.15   Erectile dysfunction: In 8/16 he was started on Cialis 5 mg for erectile dysfunction.   Microscopic hematuria: He was found to have microscopic hematuria and 5/18. A CT scan revealed a single right renal calculus but no other abnormality and cystoscopy was negative.   NON-GU PMH: Encounter for general adult medical examination without abnormal findings, Encounter for preventive health examination - 2016 Personal history of other diseases of the circulatory system, History of hypertension - 2014 Personal history of other diseases of the digestive system, History of esophageal reflux - 2014 Diabetes Type 2    FAMILY HISTORY: Cancer - Runs In Family Carcinoma Of The Pancreas - Runs In Family Death In The Family Father - Runs In Family Death In The Family Mother - Runs In Family No pertinent family history - Other   SOCIAL HISTORY: Marital Status: Married Preferred Language: English; Ethnicity: Not Hispanic Or Latino; Race: White Current Smoking Status: Patient has never smoked.   Tobacco Use Assessment Completed: Used Tobacco in last 30 days?     Notes: Alcohol Use, Marital History - Currently Married, Caffeine Use, Never A Smoker   REVIEW OF SYSTEMS:    GU Review Male:   Patient reports frequent urination. Patient denies hard to postpone urination,  burning/ pain with urination, get up at night to urinate, leakage of urine, stream starts and stops, trouble starting your stream, have to strain to urinate , erection problems, and penile pain.  Gastrointestinal (Upper):   Patient denies nausea, vomiting, and indigestion/ heartburn.  Gastrointestinal (Lower):   Patient denies diarrhea and constipation.  Constitutional:   Patient denies fever, night sweats, weight loss, and  fatigue.  Skin:   Patient denies skin rash/ lesion and itching.  Eyes:   Patient denies double vision and blurred vision.  Ears/ Nose/ Throat:   Patient denies sore throat and sinus problems.  Hematologic/Lymphatic:   Patient denies swollen glands and easy bruising.  Cardiovascular:   Patient denies leg swelling and chest pains.  Respiratory:   Patient denies cough and shortness of breath.  Endocrine:   Patient denies excessive thirst.  Musculoskeletal:   Patient reports back pain. Patient denies joint pain.  Neurological:   Patient denies headaches and dizziness.  Psychologic:   Patient denies depression and anxiety.   Notes: right side back pain Updated from previous visit 10/30/2019 with review from patient as noted above.   VITAL SIGNS: None   MULTI-SYSTEM PHYSICAL EXAMINATION:    Gastrointestinal: No mass, no tenderness, no rigidity, non obese abdomen. No CVA tenderness     Complexity of Data:   04/13/20 10/06/19 03/28/19 09/12/18 03/14/18 01/07/18 09/06/17 02/27/17  PSA  Total PSA 3.97 ng/mL 4.27 ng/mL 4.49 ng/mL 5.42 ng/mL 5.43 ng/mL 4.42 ng/mL 5.64 ng/mL 4.80 ng/mL  Free PSA  1.17 ng/mL 1.04 ng/mL 1.89 ng/mL  1.35 ng/mL 1.52 ng/mL 1.39 ng/mL  % Free PSA  27 % PSA 23 % PSA 35 % PSA  31 % PSA 27 % PSA 29 % PSA    PROCEDURES:         KUB - 74018  A single view of the abdomen is obtained.      . Patient confirmed No Neulasta OnPro Device. KUB is reviewed today and shows: No obvious bony abnormality. The previously seen calcification overlying the right  renal shadow is no longer present. I do not see any calcification overlying the expected course of the right ureter.           Renal Ultrasound - 40086  Right Kidney: Length: 11.7cm Depth: 6.4 cm Cortical Width: 1.1 cm Width: 5.4 cm  Left Kidney: Length: 13.7 cm Depth: 5.6 cm Cortical Width: 1.4 cm Width: 5.9 cm  Left Kidney/Ureter:  There are multiple cystic areas noted. The largest were measured. There is a .36cm hyperechoic foci in the UP. There is mild pelvic dilation measuring 1.1cm this appears similar to prev. [place] (06/03/2021)  Right Kidney/Ureter:  Compared to previous US (06/03/2021) there is now parapelvic cyst formation vs pelvic dilation/hydro. The cystic areas are connected throughout the kidney and the largest portion was measured today.   Bladder:  PVR= 8.84m      . Patient confirmed No Neulasta OnPro Device.           Urinalysis w/Scope - 81001 Dipstick Dipstick Cont'd Micro  Color: Yellow Bilirubin: Neg mg/dL WBC/hpf: 0 - 5/hpf  Appearance: Slightly Cloudy Ketones: Neg mg/dL RBC/hpf: 10 - 20/hpf  Specific Gravity: 1.025 Blood: 3+ ery/uL Bacteria: Few (10-25/hpf)  pH: <=5.0 Protein: Neg mg/dL Cystals: NS (Not Seen)  Glucose: Neg mg/dL Urobilinogen: 0.2 mg/dL Casts: NS (Not Seen)    Nitrites: Neg Trichomonas: Not Present    Leukocyte Esterase: 1+ leu/uL Mucous: Present      Epithelial Cells: NS (Not Seen)      Yeast: NS (Not Seen)      Sperm: Not Present    ASSESSMENT:      ICD-10 Details  1 GU:   Hydronephrosis - NP61.9Acute, Complicated Injury  2   Microscopic hematuria - RJ09.32Acute, Complicated Injury  3   BPH w/LUTS - N40.1 Chronic, Stable  4   Weak  Urinary Stream - R39.12 Chronic, Stable   PLAN:            Medications New Meds: Alfuzosin Hcl Er 10 mg tablet, extended release 24 hr 1 tablet PO Q HS   #30  11 Refill(s)  Pharmacy Name:  [facility]  Address:  [place], [place] [id number]  Phone:  [phone]  Fax:  [phone]             Orders Labs BUN/Creatinine(Stat)  X-Rays: C.T. Hematuria With and Without I.V. Contrast. No Oral Contrast - ASAP please          Schedule         Document Letter(s):  Created for Patient: Clinical Summary         Notes:   Renal ultrasound today shows a probable hydronephrosis on the right side. Patient has been having some intermittent right-sided back pain. Recommended we get CT scan with contrast to evaluate. Further disposition after CT scan available.  Addendum dated 12/16/2021-CT scan shows 6 mm right mid to distal ureteral calculus with high-grade obstruction and hydronephrosis. I discussed the results with the patient and since stone is not visible on KUB he is not a candidate for lithotripsy. Plan to proceed with cystoscopy right ureteroscopy with laser lithotripsy and stone extraction with placement of right JJ stent, risks and benefits discussed as outlined below.    I have recommended retrograde pyelogram, ureteroscopic stone manipulation with laser lithotripsy. I have discussed in detail the risks, benefits and alternatives of ureteroscopic stone extraction to include but not limited to: Bleeding, infection, ureteral perforation with need for open repair, inability to place the stent necessitating the need for further procedures, possible percutaneous nephrostomy tube placement, discomfort from the stents, hematuria, urgency, frequency and refractory problems after the stent is removed. I discussed the stent is not a permanent stent and will require a followup for stent removal or stent exchange. The patient knows there is high risk for ureteral stent incrustation if this is not removed or exchanged within 3 months. Patient voices understanding of the risks and benefits of the procedure and consents to the procedure. In regards to BPH he is having ejaculatory issues utilizing tamsulosin. I sent in prescription for alfuzosin to see if this would continue to get the desired  urinary symptom relief with less retrograde ejaculation. He knows to use this instead of the tamsulosin.

## 2021-12-19 NOTE — Progress Notes (Signed)
[place] DEVICE PROGRAMMING  Patient Information: Name:  Grant Bowers  DOB:  1945-06-18  MRN:  [id number]    Planned Procedure:  Cystoscopy, ureteroscopy, stent placement right  Surgeon:  Harold Barban, MD  Date of Procedure:  12-20-21  Cautery will be used.  Position during surgery:  Supine   Please send documentation back to:  [facility] (Fax # [phone])  Device Information:  Clinic EP Physician:  Virl Axe, MD   Device Type:  Pacemaker Manufacturer and Phone #:  [employer]. Jude/[employer]ott: [phone] Pacemaker Dependent?:  Yes.   Date of Last Device Check:  10/13/21 Normal Device Function?:  Yes.    Electrophysiologist's Recommendations:  Have magnet available. Provide continuous ECG monitoring when magnet is used or reprogramming is to be performed.  Procedure may interfere with device function.  Magnet should be placed over device during procedure.  Per Device Clinic Standing Orders, Simone Curia, RN  9:18 AM 12/19/2021

## 2021-12-19 NOTE — Progress Notes (Addendum)
COVID Vaccine Completed:  Yes  Date of COVID positive in last 90 days:  No  PCP - Cathlean Cower, MD Cardiologist - Thompson Grayer, MD EP - Virl Axe, MD  Chest x-ray - N/A EKG - Day of surgery Stress Test -  greater than 2 years  ECHO - 07-29-20 Epic Cardiac Cath - greater than 2 years Pacemaker/ICD device last checked:  10-11-21 Epic.  Device orders in Epic Spinal Cord Stimulator:  Bowel Prep - N/A  Sleep Study - Yes, +sleep apnea CPAP - Yes (currently being repaired)  Fasting Blood Sugar - 125 to 130 Checks Blood Sugar - 1 time a day  Blood Thinner Instructions: Aspirin Instructions:  ASA 81 Last Dose:  12-16-21  Activity level:   Can go up a flight of stairs and perform activities of daily living without stopping and without symptoms of chest pain or shortness of breath.  Patient states that he does get "winded" with exertion after Covid last year.    Anesthesia review:  Trifascicular block, bradycardia, pacemaker, HTN, DM, OSA.  Murmur at age 63, has not been heard since  Shortness of breath with exertion post Covid (had Covid a year ago).  Had pulmonary function and his lung capacity is at 85%  Patient denies shortness of breath, fever, cough and chest pain at PAT appointment (completed over the phone)  Patient verbalized understanding of instructions that were given to them at the PAT appointment. Patient was also instructed that they will need to review over the PAT instructions again at home before surgery.

## 2021-12-20 ENCOUNTER — Encounter (HOSPITAL_COMMUNITY): Payer: Self-pay | Admitting: Urology

## 2021-12-20 ENCOUNTER — Ambulatory Visit (HOSPITAL_COMMUNITY)
Admission: RE | Admit: 2021-12-20 | Discharge: 2021-12-20 | Disposition: A | Discharge status: Home / Self Care | Payer: Medicare PPO | Attending: Urology | Admitting: Urology

## 2021-12-20 ENCOUNTER — Ambulatory Visit (HOSPITAL_COMMUNITY): Payer: Medicare PPO | Admitting: Registered Nurse

## 2021-12-20 ENCOUNTER — Encounter (HOSPITAL_COMMUNITY)
Admission: RE | Disposition: A | Discharge status: Home / Self Care | Payer: Self-pay | Source: Home / Self Care | Attending: Urology

## 2021-12-20 ENCOUNTER — Ambulatory Visit (HOSPITAL_COMMUNITY): Payer: Medicare PPO

## 2021-12-20 ENCOUNTER — Ambulatory Visit (HOSPITAL_BASED_OUTPATIENT_CLINIC_OR_DEPARTMENT_OTHER): Payer: Medicare PPO | Admitting: Registered Nurse

## 2021-12-20 DIAGNOSIS — N201 Calculus of ureter: Secondary | ICD-10-CM | POA: Diagnosis not present

## 2021-12-20 DIAGNOSIS — N401 Enlarged prostate with lower urinary tract symptoms: Secondary | ICD-10-CM | POA: Insufficient documentation

## 2021-12-20 DIAGNOSIS — G4733 Obstructive sleep apnea (adult) (pediatric): Secondary | ICD-10-CM | POA: Insufficient documentation

## 2021-12-20 DIAGNOSIS — Z7984 Long term (current) use of oral hypoglycemic drugs: Secondary | ICD-10-CM | POA: Diagnosis not present

## 2021-12-20 DIAGNOSIS — I2510 Atherosclerotic heart disease of native coronary artery without angina pectoris: Secondary | ICD-10-CM

## 2021-12-20 DIAGNOSIS — N132 Hydronephrosis with renal and ureteral calculous obstruction: Secondary | ICD-10-CM | POA: Diagnosis not present

## 2021-12-20 DIAGNOSIS — Z9989 Dependence on other enabling machines and devices: Secondary | ICD-10-CM

## 2021-12-20 DIAGNOSIS — R3129 Other microscopic hematuria: Secondary | ICD-10-CM | POA: Insufficient documentation

## 2021-12-20 DIAGNOSIS — Z950 Presence of cardiac pacemaker: Secondary | ICD-10-CM | POA: Insufficient documentation

## 2021-12-20 DIAGNOSIS — E119 Type 2 diabetes mellitus without complications: Secondary | ICD-10-CM | POA: Diagnosis not present

## 2021-12-20 DIAGNOSIS — Z79899 Other long term (current) drug therapy: Secondary | ICD-10-CM | POA: Insufficient documentation

## 2021-12-20 DIAGNOSIS — K219 Gastro-esophageal reflux disease without esophagitis: Secondary | ICD-10-CM | POA: Insufficient documentation

## 2021-12-20 DIAGNOSIS — I10 Essential (primary) hypertension: Secondary | ICD-10-CM

## 2021-12-20 DIAGNOSIS — Z01818 Encounter for other preprocedural examination: Secondary | ICD-10-CM

## 2021-12-20 DIAGNOSIS — R3912 Poor urinary stream: Secondary | ICD-10-CM | POA: Diagnosis not present

## 2021-12-20 HISTORY — DX: Type 2 diabetes mellitus without complications: E11.9

## 2021-12-20 HISTORY — DX: Unspecified malignant neoplasm of skin, unspecified: C44.90

## 2021-12-20 HISTORY — DX: COVID-19: U07.1

## 2021-12-20 HISTORY — PX: CYSTOSCOPY/URETEROSCOPY/HOLMIUM LASER/STENT PLACEMENT: SHX6546

## 2021-12-20 HISTORY — DX: Dyspnea, unspecified: R06.00

## 2021-12-20 LAB — CBC WITH DIFFERENTIAL/PLATELET
Abs Immature Granulocytes: 0.03 10*3/uL (ref 0.00–0.07)
Basophils Absolute: 0 10*3/uL (ref 0.0–0.1)
Basophils Relative: 0 %
Eosinophils Absolute: 0.1 10*3/uL (ref 0.0–0.5)
Eosinophils Relative: 2 %
HCT: 38.7 % — ABNORMAL LOW (ref 39.0–52.0)
Hemoglobin: 13.1 g/dL (ref 13.0–17.0)
Immature Granulocytes: 0 %
Lymphocytes Relative: 13 %
Lymphs Abs: 1 10*3/uL (ref 0.7–4.0)
MCH: 29.6 pg (ref 26.0–34.0)
MCHC: 33.9 g/dL (ref 30.0–36.0)
MCV: 87.6 fL (ref 80.0–100.0)
Monocytes Absolute: 0.7 10*3/uL (ref 0.1–1.0)
Monocytes Relative: 9 %
Neutro Abs: 6.1 10*3/uL (ref 1.7–7.7)
Neutrophils Relative %: 76 %
Platelets: 204 10*3/uL (ref 150–400)
RBC: 4.42 MIL/uL (ref 4.22–5.81)
RDW: 12.7 % (ref 11.5–15.5)
WBC: 8 10*3/uL (ref 4.0–10.5)
nRBC: 0 % (ref 0.0–0.2)

## 2021-12-20 LAB — BASIC METABOLIC PANEL
Anion gap: 7 (ref 5–15)
BUN: 28 mg/dL — ABNORMAL HIGH (ref 8–23)
CO2: 23 mmol/L (ref 22–32)
Calcium: 9.6 mg/dL (ref 8.9–10.3)
Chloride: 112 mmol/L — ABNORMAL HIGH (ref 98–111)
Creatinine, Ser: 1.52 mg/dL — ABNORMAL HIGH (ref 0.61–1.24)
GFR, Estimated: 47 mL/min — ABNORMAL LOW (ref >60)
Glucose, Bld: 173 mg/dL — ABNORMAL HIGH (ref 70–99)
Potassium: 3.6 mmol/L (ref 3.5–5.1)
Sodium: 142 mmol/L (ref 135–145)

## 2021-12-20 LAB — GLUCOSE, CAPILLARY
Glucose-Capillary: 154 mg/dL — ABNORMAL HIGH (ref 70–99)
Glucose-Capillary: 141 mg/dL — ABNORMAL HIGH (ref 70–99)

## 2021-12-20 SURGERY — CYSTOSCOPY/URETEROSCOPY/HOLMIUM LASER/STENT PLACEMENT
Anesthesia: General | Laterality: Right

## 2021-12-20 MED ORDER — AMISULPRIDE (ANTIEMETIC) 5 MG/2ML IV SOLN: 10.0000 mg | Freq: Once | INTRAVENOUS | Status: DC | PRN

## 2021-12-20 MED ORDER — LIDOCAINE 2% (20 MG/ML) 5 ML SYRINGE
INTRAMUSCULAR | Status: DC | PRN
  Administered 2021-12-20: 60 mg via INTRAVENOUS

## 2021-12-20 MED ORDER — ONDANSETRON HCL 4 MG/2ML IJ SOLN
INTRAMUSCULAR | Status: DC | PRN
  Administered 2021-12-20: 4 mg via INTRAVENOUS

## 2021-12-20 MED ORDER — ACETAMINOPHEN 500 MG PO TABS
ORAL_TABLET | ORAL | Status: AC
  Administered 2021-12-20: 1000 mg via ORAL
  Filled 2021-12-20: qty 2

## 2021-12-20 MED ORDER — KETOROLAC TROMETHAMINE 30 MG/ML IJ SOLN
INTRAMUSCULAR | Status: DC | PRN
  Administered 2021-12-20: 30 mg via INTRAVENOUS

## 2021-12-20 MED ORDER — PHENYLEPHRINE 80 MCG/ML (10ML) SYRINGE FOR IV PUSH (FOR BLOOD PRESSURE SUPPORT)
PREFILLED_SYRINGE | INTRAVENOUS | Status: AC
  Filled 2021-12-20: qty 10

## 2021-12-20 MED ORDER — EPHEDRINE 5 MG/ML INJ
INTRAVENOUS | Status: AC
  Filled 2021-12-20: qty 5

## 2021-12-20 MED ORDER — PHENYLEPHRINE 80 MCG/ML (10ML) SYRINGE FOR IV PUSH (FOR BLOOD PRESSURE SUPPORT)
PREFILLED_SYRINGE | INTRAVENOUS | Status: DC | PRN
  Administered 2021-12-20 (×5): 80 ug via INTRAVENOUS
  Administered 2021-12-20 (×2): 160 ug via INTRAVENOUS

## 2021-12-20 MED ORDER — VASOPRESSIN 20 UNIT/ML IV SOLN
INTRAVENOUS | Status: AC
  Filled 2021-12-20: qty 1

## 2021-12-20 MED ORDER — FENTANYL CITRATE (PF) 100 MCG/2ML IJ SOLN
INTRAMUSCULAR | Status: DC | PRN
  Administered 2021-12-20 (×2): 25 ug via INTRAVENOUS

## 2021-12-20 MED ORDER — ORAL CARE MOUTH RINSE
15.0000 mL | Freq: Once | OROMUCOSAL | Status: AC
Start: 2021-12-20 — End: 2021-12-20

## 2021-12-20 MED ORDER — DEXAMETHASONE SODIUM PHOSPHATE 10 MG/ML IJ SOLN
INTRAMUSCULAR | Status: DC | PRN
  Administered 2021-12-20: 5 mg via INTRAVENOUS

## 2021-12-20 MED ORDER — FENTANYL CITRATE PF 50 MCG/ML IJ SOSY: 25.0000 ug | PREFILLED_SYRINGE | INTRAMUSCULAR | Status: DC | PRN

## 2021-12-20 MED ORDER — FENTANYL CITRATE (PF) 100 MCG/2ML IJ SOLN
INTRAMUSCULAR | Status: AC
  Filled 2021-12-20: qty 2

## 2021-12-20 MED ORDER — DEXAMETHASONE SODIUM PHOSPHATE 10 MG/ML IJ SOLN
INTRAMUSCULAR | Status: AC
  Filled 2021-12-20: qty 1

## 2021-12-20 MED ORDER — PROPOFOL 10 MG/ML IV BOLUS
INTRAVENOUS | Status: DC | PRN
  Administered 2021-12-20: 200 mg via INTRAVENOUS

## 2021-12-20 MED ORDER — GLYCOPYRROLATE 0.2 MG/ML IJ SOLN
INTRAMUSCULAR | Status: AC
  Filled 2021-12-20: qty 1

## 2021-12-20 MED ORDER — TRAMADOL HCL 50 MG PO TABS: 50.0000 mg | ORAL_TABLET | Freq: Four times a day (QID) | ORAL | 0 refills | Status: AC | PRN

## 2021-12-20 MED ORDER — IOHEXOL 300 MG/ML  SOLN
INTRAMUSCULAR | Status: DC | PRN
  Administered 2021-12-20: 21 mL

## 2021-12-20 MED ORDER — ONDANSETRON HCL 4 MG/2ML IJ SOLN
INTRAMUSCULAR | Status: AC
  Filled 2021-12-20: qty 2

## 2021-12-20 MED ORDER — SODIUM CHLORIDE (PF) 0.9 % IJ SOLN
INTRAMUSCULAR | Status: AC
  Filled 2021-12-20: qty 20

## 2021-12-20 MED ORDER — LIDOCAINE HCL (PF) 2 % IJ SOLN
INTRAMUSCULAR | Status: AC
  Filled 2021-12-20: qty 5

## 2021-12-20 MED ORDER — LACTATED RINGERS IV SOLN: INTRAVENOUS | Status: DC

## 2021-12-20 MED ORDER — SODIUM CHLORIDE 0.9 % IR SOLN
Status: DC | PRN
  Administered 2021-12-20 (×2): 3000 mL via INTRAVESICAL

## 2021-12-20 MED ORDER — CHLORHEXIDINE GLUCONATE 0.12 % MT SOLN
15.0000 mL | Freq: Once | OROMUCOSAL | Status: AC
Start: 2021-12-20 — End: 2021-12-20
  Administered 2021-12-20: 15 mL via OROMUCOSAL

## 2021-12-20 MED ORDER — CEFAZOLIN SODIUM-DEXTROSE 2-4 GM/100ML-% IV SOLN
2.0000 g | INTRAVENOUS | Status: AC
  Administered 2021-12-20: 2 g via INTRAVENOUS

## 2021-12-20 MED ORDER — PROPOFOL 10 MG/ML IV BOLUS
INTRAVENOUS | Status: AC
  Filled 2021-12-20: qty 20

## 2021-12-20 MED ORDER — CEFAZOLIN SODIUM-DEXTROSE 2-4 GM/100ML-% IV SOLN
INTRAVENOUS | Status: AC
  Filled 2021-12-20: qty 100

## 2021-12-20 MED ORDER — ACETAMINOPHEN 500 MG PO TABS: 1000.0000 mg | ORAL_TABLET | Freq: Once | ORAL | Status: AC

## 2021-12-20 SURGICAL SUPPLY — 24 items
BAG URO CATCHER STRL LF (MISCELLANEOUS) ×1 IMPLANT
BASKET ZERO TIP NITINOL 2.4FR (BASKET) IMPLANT
BSKT STON RTRVL ZERO TP 2.4FR (BASKET)
BULB IRRIG PATHFIND (MISCELLANEOUS) ×1 IMPLANT
CATH URETL OPEN 5X70 (CATHETERS) IMPLANT
CLOTH BEACON ORANGE TIMEOUT ST (SAFETY) ×1 IMPLANT
EXTRACTOR STONE 1.7FRX115CM (UROLOGICAL SUPPLIES) IMPLANT
GLOVE SURG LX STRL 7.5 STRW (GLOVE) ×1 IMPLANT
GOWN STRL REUS W/ TWL XL LVL3 (GOWN DISPOSABLE) ×1 IMPLANT
GOWN STRL REUS W/TWL XL LVL3 (GOWN DISPOSABLE) ×1
GUIDEWIRE ANG ZIPWIRE 038X150 (WIRE) IMPLANT
GUIDEWIRE STR DUAL SENSOR (WIRE) ×1 IMPLANT
KIT TURNOVER KIT A (KITS) IMPLANT
LASER FIB FLEXIVA PULSE ID 365 (Laser) IMPLANT
MANIFOLD NEPTUNE II (INSTRUMENTS) ×1 IMPLANT
PACK CYSTO (CUSTOM PROCEDURE TRAY) ×1 IMPLANT
SHEATH NAVIGATOR HD 11/13X36 (SHEATH) IMPLANT
SHEATH NAVIGATOR HD 12/14X36 (SHEATH) IMPLANT
STENT URET 6FRX26 CONTOUR (STENTS) IMPLANT
SYR 20ML LL LF (SYRINGE) ×1 IMPLANT
TRACTIP FLEXIVA PULS ID 200XHI (Laser) IMPLANT
TRACTIP FLEXIVA PULSE ID 200 (Laser) IMPLANT
TUBING CONNECTING 10 (TUBING) ×1 IMPLANT
TUBING UROLOGY SET (TUBING) ×1 IMPLANT

## 2021-12-20 NOTE — Anesthesia Preprocedure Evaluation (Signed)
Anesthesia Evaluation  Patient identified by MRN, date of birth, ID band Patient awake    Reviewed: Allergy & Precautions, NPO status , Patient's Chart, lab work & pertinent test results  Airway Mallampati: II  TM Distance: >3 FB Neck ROM: Full    Dental no notable dental hx. (+) Teeth Intact, Dental Advisory Given, Caps   Pulmonary sleep apnea and Continuous Positive Airway Pressure Ventilation ,    Pulmonary exam normal breath sounds clear to auscultation       Cardiovascular hypertension, Pt. on medications negative cardio ROS Normal cardiovascular exam+ pacemaker  Rhythm:Regular Rate:Normal  Echo: 1. Left ventricular ejection fraction, by estimation, is 65 to 70%. The  left ventricle has normal function. The left ventricle has no regional  wall motion abnormalities. There is mild left ventricular hypertrophy.  Left ventricular diastolic parameters  are consistent with Grade I diastolic dysfunction (impaired relaxation).  The average left ventricular global longitudinal strain is 24.0 %. The  global longitudinal strain is normal.  2. Right ventricular systolic function is normal. The right ventricular  size is normal.  3. Left atrial size was moderately dilated.  4. The pericardial effusion is circumferential.  5. The mitral valve is normal in structure. Mild mitral valve  regurgitation. No evidence of mitral stenosis.  6. The aortic valve is tricuspid. Aortic valve regurgitation is not  visualized. No aortic stenosis is present.  7. The inferior vena cava is normal in size with greater than 50%  respiratory variability, suggesting right atrial pressure of 3 mmHg.    Neuro/Psych negative neurological ROS  negative psych ROS   GI/Hepatic Neg liver ROS, GERD  Medicated,  Endo/Other  negative endocrine ROSdiabetes  Renal/GU Renal disease  negative genitourinary   Musculoskeletal negative musculoskeletal  ROS (+)   Abdominal   Peds negative pediatric ROS (+)  Hematology negative hematology ROS (+)   Anesthesia Other Findings   Reproductive/Obstetrics negative OB ROS                             Anesthesia Physical  Anesthesia Plan  ASA: 3  Anesthesia Plan: General   Post-op Pain Management: Tylenol PO (pre-op)* and Minimal or no pain anticipated   Induction: Intravenous  PONV Risk Score and Plan: 3 and Ondansetron, Dexamethasone and Treatment may vary due to age or medical condition  Airway Management Planned: LMA  Additional Equipment: None  Intra-op Plan:   Post-operative Plan: Extubation in OR  Informed Consent: I have reviewed the patients History and Physical, chart, labs and discussed the procedure including the risks, benefits and alternatives for the proposed anesthesia with the patient or authorized representative who has indicated his/her understanding and acceptance.     Dental advisory given  Plan Discussed with: CRNA and Anesthesiologist  Anesthesia Plan Comments:         Anesthesia Quick Evaluation

## 2021-12-20 NOTE — Anesthesia Postprocedure Evaluation (Signed)
Anesthesia Post Note  Patient: Grant Bowers  Procedure(s) Performed: CYSTOSCOPY/URETEROSCOPY/HOLMIUM LASER/STENT PLACEMENT (Right)     Patient location during evaluation: PACU Anesthesia Type: General Level of consciousness: awake and alert Pain management: pain level controlled Vital Signs Assessment: post-procedure vital signs reviewed and stable Respiratory status: spontaneous breathing, nonlabored ventilation, respiratory function stable and patient connected to nasal cannula oxygen Cardiovascular status: blood pressure returned to baseline and stable Postop Assessment: no apparent nausea or vomiting Anesthetic complications: no   No notable events documented.  Last Vitals:  Vitals:   12/20/21 0900 12/20/21 0914  BP: (!) 145/66 (!) 142/65  Pulse: 64 64  Resp: 17 11  Temp:  36.4 C  SpO2: 99% 96%    Last Pain:  Vitals:   12/20/21 0914  TempSrc:   PainSc: 0-No pain                 Tiajuana Amass

## 2021-12-20 NOTE — Anesthesia Procedure Notes (Addendum)
Procedure Name: LMA Insertion Date/Time: 12/20/2021 7:28 AM  Performed by: Victoriano Lain, CRNAPre-anesthesia Checklist: Patient identified, Emergency Drugs available, Suction available, Patient being monitored and Timeout performed Patient Re-evaluated:Patient Re-evaluated prior to induction Oxygen Delivery Method: Circle system utilized Preoxygenation: Pre-oxygenation with 100% oxygen Induction Type: IV induction LMA: LMA inserted LMA Size: 4.0 Number of attempts: 2 Placement Confirmation: positive ETCO2 and breath sounds checked- equal and bilateral Tube secured with: Tape Dental Injury: Teeth and Oropharynx as per pre-operative assessment  Comments: Smooth IV induction. Attempted to placed a lubricated #4 Proseal LMA. Unable to obtain proper seating. LMA removed and #4 LMA placed with ease. +ETCO2. LMA secured in place.

## 2021-12-20 NOTE — Op Note (Signed)
Preoperative diagnosis:  1.  Right mid ureteral calculus  Postoperative diagnosis: 1.  Right mid ureteral calculus  Procedure(s): 1.  Cystoscopy, right retrograde pyelogram with intraoperative interpretation, right ureteroscopy with laser lithotripsy, stone extraction, insertion right JJ stent  Surgeon: Dr. Harold Barban  Anesthesia: General  Complications: None  EBL: Minimal  Specimens: Kidney stone fragments  Disposition of specimens: With patient  Intraoperative findings: 8 mm right mid ureteral calculus just above the iliac crossing, stone fragmented and all significant fragments extracted, 6 [place] by 26 cm Percuflex plus soft contour stent placed  Indication: 76 year old white male presented with right-sided flank pain noted on CT urogram to have a 8 mm mid ureteral calculus with significant hydroureteronephrosis.  Presents this time and get cystoscopy right ureteroscopy and laser lithotripsy and stone extraction.  Description of procedure:  After obtaining informed consent for the patient is taken the major cystoscopy suite placed under general anesthesia.  Placed in the dorsolithotomy position genitalia prepped draped in usual sterile fashion.  Proper pause and timeout was performed for site of procedure.  92 French cystoscope was advanced into the bladder without difficulty.  Patient did have some slight median lobe hypertrophy which made entry to the bladder marginally difficult.  The right ureteral orifice was identified.  5 French open tip catheter was utilized to cannulate the right ureteral orifice and retrograde pyelogram confirmed filling defect in the right mid ureter consistent with a stone seen on preoperative CT scan.  A sensor wire was passed up to the right renal pelvis through the open tip catheter without difficulty.  The operative catheter and the cystoscope were removed.  The 6.4 French long semirigid ureteroscope was then advanced over the guidewire and I was  able to reach the stone without difficulty.  I initially tried to treat the stone over the guidewire however this created a bad angle.  The scope was removed leaving the guidewire in place.  Then passed the ureteroscope alongside the guidewire up to the level of the stone without difficulty.  200 m holmium laser fiber was then utilized to fragment the stone into numerous small pieces and dust particles.  Larger fragments were extracted with separate passes of the ureteroscope utilizing an engage basket.  All significant fragments were removed.  Retrograde pyelogram through the scope revealed no proximal filling defects good integrity of the collecting system without extravasation of contrast.  There was significant hydronephrosis noted.  The ureteroscope was removed visualizing the ureter along the way and no significant fragments remaining.  The guidewire was still in place.  Wire was then back fed through the cystoscope and a 6 [place] by 26 cm Percuflex plus soft Contour stent was placed leaving a proximal coil in the renal pelvis and distal coil in the bladder.  There was good flow of urine through and around the stent noted.  Nylon suture was left on the distal end of the stent left protruding to the urethral meatus to facilitate future removal.  Bladder was emptied and the fragments collected for analysis.  Procedure was terminated he was awakened from anesthesia and taken back to the recovery room in stable condition.  No immediate complication from the procedure.

## 2021-12-20 NOTE — Transfer of Care (Signed)
Immediate Anesthesia Transfer of Care Note  Patient: Grant Bowers  Procedure(s) Performed: CYSTOSCOPY/URETEROSCOPY/HOLMIUM LASER/STENT PLACEMENT (Right)  Patient Location: PACU  Anesthesia Type:General  Level of Consciousness: awake, alert , oriented and patient cooperative  Airway & Oxygen Therapy: Patient Spontanous Breathing and Patient connected to face mask oxygen  Post-op Assessment: Report given to RN, Post -op Vital signs reviewed and stable and Patient moving all extremities  Post vital signs: Reviewed and stable  Last Vitals:  Vitals Value Taken Time  BP 133/66 12/20/21 0846  Temp 36.6 C 12/20/21 0846  Pulse 73 12/20/21 0847  Resp 10 12/20/21 0847  SpO2 100 % 12/20/21 0847  Vitals shown include unvalidated device data.  Last Pain:  Vitals:   12/20/21 0846  TempSrc:   PainSc: Asleep      Patients Stated Pain Goal: 2 (92/42/68 3419)  Complications: No notable events documented.

## 2021-12-20 NOTE — Interval H&P Note (Signed)
History and Physical Interval Note:  12/20/2021 7:15 AM  Grant Bowers  has presented today for surgery, with the diagnosis of URETERAL CALCULUS.  The various methods of treatment have been discussed with the patient and family. After consideration of risks, benefits and other options for treatment, the patient has consented to  Procedure(s) with comments: CYSTOSCOPY/URETEROSCOPY/HOLMIUM LASER/STENT PLACEMENT (Right) - 45 MINS as a surgical intervention.  The patient's history has been reviewed, patient examined, no change in status, stable for surgery.  I have reviewed the patient's chart and labs.  Questions were answered to the patient's satisfaction.     Remi Haggard

## 2021-12-21 ENCOUNTER — Encounter (HOSPITAL_COMMUNITY): Payer: Self-pay | Admitting: Urology

## 2021-12-26 DIAGNOSIS — N201 Calculus of ureter: Secondary | ICD-10-CM | POA: Diagnosis not present

## 2021-12-28 ENCOUNTER — Other Ambulatory Visit: Payer: Self-pay | Admitting: Internal Medicine

## 2021-12-28 NOTE — Telephone Encounter (Signed)
Please refill as per office routine med refill policy (all routine meds to be refilled for 3 mo or monthly (per pt preference) up to one year from last visit, then month to month grace period for 3 mo, then further med refills will have to be denied) ? ?

## 2022-01-02 ENCOUNTER — Other Ambulatory Visit: Payer: Self-pay | Admitting: Internal Medicine

## 2022-01-10 ENCOUNTER — Ambulatory Visit (INDEPENDENT_AMBULATORY_CARE_PROVIDER_SITE_OTHER): Payer: Medicare PPO

## 2022-01-10 DIAGNOSIS — I441 Atrioventricular block, second degree: Secondary | ICD-10-CM | POA: Diagnosis not present

## 2022-01-10 LAB — CUP PACEART REMOTE DEVICE CHECK
Battery Remaining Longevity: 103 mo
Battery Remaining Percentage: 86 %
Battery Voltage: 2.99 V
Brady Statistic AP VP Percent: 16 %
Brady Statistic AP VS Percent: 1 %
Brady Statistic AS VP Percent: 83 %
Brady Statistic AS VS Percent: 1 %
Brady Statistic RA Percent Paced: 15 %
Brady Statistic RV Percent Paced: 98 %
Date Time Interrogation Session: 20231003020012
Implantable Lead Implant Date: 20220401
Implantable Lead Implant Date: 20220401
Implantable Lead Location: 753859
Implantable Lead Location: 753860
Implantable Lead Model: 3830
Implantable Pulse Generator Implant Date: 20220401
Lead Channel Impedance Value: 480 Ohm
Lead Channel Impedance Value: 510 Ohm
Lead Channel Pacing Threshold Amplitude: 0.5 V
Lead Channel Pacing Threshold Amplitude: 1.125 V
Lead Channel Pacing Threshold Pulse Width: 0.4 ms
Lead Channel Pacing Threshold Pulse Width: 0.4 ms
Lead Channel Sensing Intrinsic Amplitude: 2.7 mV
Lead Channel Sensing Intrinsic Amplitude: 7.3 mV
Lead Channel Setting Pacing Amplitude: 1.375
Lead Channel Setting Pacing Amplitude: 2 V
Lead Channel Setting Pacing Pulse Width: 0.4 ms
Lead Channel Setting Sensing Sensitivity: 2 mV
Pulse Gen Model: 2272
Pulse Gen Serial Number: 3912376

## 2022-01-20 NOTE — Progress Notes (Signed)
Remote pacemaker transmission.   

## 2022-02-18 ENCOUNTER — Other Ambulatory Visit: Payer: Self-pay | Admitting: Internal Medicine

## 2022-02-18 NOTE — Telephone Encounter (Signed)
Please refill as per office routine med refill policy (all routine meds to be refilled for 3 mo or monthly (per pt preference) up to one year from last visit, then month to month grace period for 3 mo, then further med refills will have to be denied) ? ?

## 2022-02-21 DIAGNOSIS — L810 Postinflammatory hyperpigmentation: Secondary | ICD-10-CM | POA: Diagnosis not present

## 2022-02-21 DIAGNOSIS — L814 Other melanin hyperpigmentation: Secondary | ICD-10-CM | POA: Diagnosis not present

## 2022-02-21 DIAGNOSIS — L821 Other seborrheic keratosis: Secondary | ICD-10-CM | POA: Diagnosis not present

## 2022-02-21 DIAGNOSIS — L249 Irritant contact dermatitis, unspecified cause: Secondary | ICD-10-CM | POA: Diagnosis not present

## 2022-03-07 DIAGNOSIS — E7800 Pure hypercholesterolemia, unspecified: Secondary | ICD-10-CM | POA: Diagnosis not present

## 2022-03-07 DIAGNOSIS — I10 Essential (primary) hypertension: Secondary | ICD-10-CM | POA: Diagnosis not present

## 2022-03-07 DIAGNOSIS — E1165 Type 2 diabetes mellitus with hyperglycemia: Secondary | ICD-10-CM | POA: Diagnosis not present

## 2022-03-08 ENCOUNTER — Telehealth: Payer: Self-pay

## 2022-03-08 MED ORDER — NIRMATRELVIR/RITONAVIR (PAXLOVID) TABLET (RENAL DOSING): 2.0000 | ORAL_TABLET | Freq: Two times a day (BID) | ORAL | 0 refills | Status: AC

## 2022-03-08 MED ORDER — HYDROCODONE BIT-HOMATROP MBR 5-1.5 MG/5ML PO SOLN: 5.0000 mL | Freq: Four times a day (QID) | ORAL | 0 refills | Status: AC | PRN

## 2022-03-08 NOTE — Telephone Encounter (Signed)
Left vmail to make patient aware the meds were called into requested pharmacy.

## 2022-03-08 NOTE — Telephone Encounter (Signed)
Ok done erx 

## 2022-03-08 NOTE — Telephone Encounter (Signed)
COVID POS on 03/08/2022.  Pt sxs started x3 days ago. Pt states when he last was dx with COVID he had to end up with a pacemaker being placed a/w new dx of dm. Pt states can provider to please send in anti-viral to help him with congestion and cough.   Send meds to [facility] per pt.

## 2022-04-11 ENCOUNTER — Ambulatory Visit (INDEPENDENT_AMBULATORY_CARE_PROVIDER_SITE_OTHER): Payer: Medicare PPO

## 2022-04-11 DIAGNOSIS — K219 Gastro-esophageal reflux disease without esophagitis: Secondary | ICD-10-CM | POA: Diagnosis not present

## 2022-04-11 DIAGNOSIS — N401 Enlarged prostate with lower urinary tract symptoms: Secondary | ICD-10-CM | POA: Diagnosis not present

## 2022-04-11 DIAGNOSIS — E7800 Pure hypercholesterolemia, unspecified: Secondary | ICD-10-CM | POA: Diagnosis not present

## 2022-04-11 DIAGNOSIS — N200 Calculus of kidney: Secondary | ICD-10-CM | POA: Diagnosis not present

## 2022-04-11 DIAGNOSIS — I441 Atrioventricular block, second degree: Secondary | ICD-10-CM

## 2022-04-11 DIAGNOSIS — I10 Essential (primary) hypertension: Secondary | ICD-10-CM | POA: Diagnosis not present

## 2022-04-11 DIAGNOSIS — E1165 Type 2 diabetes mellitus with hyperglycemia: Secondary | ICD-10-CM | POA: Diagnosis not present

## 2022-04-11 LAB — CUP PACEART REMOTE DEVICE CHECK
Battery Remaining Longevity: 101 mo
Battery Remaining Percentage: 83 %
Battery Voltage: 2.99 V
Brady Statistic AP VP Percent: 19 %
Brady Statistic AP VS Percent: 1 %
Brady Statistic AS VP Percent: 80 %
Brady Statistic AS VS Percent: 1 %
Brady Statistic RA Percent Paced: 18 %
Brady Statistic RV Percent Paced: 99 %
Date Time Interrogation Session: 20240102020013
Implantable Lead Connection Status: 753985
Implantable Lead Connection Status: 753985
Implantable Lead Implant Date: 20220401
Implantable Lead Implant Date: 20220401
Implantable Lead Location: 753859
Implantable Lead Location: 753860
Implantable Lead Model: 3830
Implantable Pulse Generator Implant Date: 20220401
Lead Channel Impedance Value: 480 Ohm
Lead Channel Impedance Value: 490 Ohm
Lead Channel Pacing Threshold Amplitude: 0.5 V
Lead Channel Pacing Threshold Amplitude: 1 V
Lead Channel Pacing Threshold Pulse Width: 0.4 ms
Lead Channel Pacing Threshold Pulse Width: 0.4 ms
Lead Channel Sensing Intrinsic Amplitude: 1.6 mV
Lead Channel Sensing Intrinsic Amplitude: 6.5 mV
Lead Channel Setting Pacing Amplitude: 1.25 V
Lead Channel Setting Pacing Amplitude: 2 V
Lead Channel Setting Pacing Pulse Width: 0.4 ms
Lead Channel Setting Sensing Sensitivity: 2 mV
Pulse Gen Model: 2272
Pulse Gen Serial Number: 3912376

## 2022-04-13 ENCOUNTER — Other Ambulatory Visit: Payer: Self-pay | Admitting: Internal Medicine

## 2022-04-13 NOTE — Telephone Encounter (Signed)
Please refill as per office routine med refill policy (all routine meds to be refilled for 3 mo or monthly (per pt preference) up to one year from last visit, then month to month grace period for 3 mo, then further med refills will have to be denied) ? ?

## 2022-04-24 ENCOUNTER — Ambulatory Visit: Payer: Medicare PPO | Admitting: Internal Medicine

## 2022-04-25 ENCOUNTER — Ambulatory Visit: Payer: Medicare HMO | Admitting: Internal Medicine

## 2022-04-25 ENCOUNTER — Encounter: Payer: Self-pay | Admitting: Internal Medicine

## 2022-04-25 VITALS — BP 116/62 | HR 80 | Temp 98.1°F | Ht 72.0 in | Wt 197.0 lb

## 2022-04-25 DIAGNOSIS — E559 Vitamin D deficiency, unspecified: Secondary | ICD-10-CM | POA: Diagnosis not present

## 2022-04-25 DIAGNOSIS — E7800 Pure hypercholesterolemia, unspecified: Secondary | ICD-10-CM | POA: Diagnosis not present

## 2022-04-25 DIAGNOSIS — Z125 Encounter for screening for malignant neoplasm of prostate: Secondary | ICD-10-CM

## 2022-04-25 DIAGNOSIS — E1122 Type 2 diabetes mellitus with diabetic chronic kidney disease: Secondary | ICD-10-CM | POA: Diagnosis not present

## 2022-04-25 DIAGNOSIS — E538 Deficiency of other specified B group vitamins: Secondary | ICD-10-CM

## 2022-04-25 DIAGNOSIS — Z0000 Encounter for general adult medical examination without abnormal findings: Secondary | ICD-10-CM | POA: Diagnosis not present

## 2022-04-25 DIAGNOSIS — E1165 Type 2 diabetes mellitus with hyperglycemia: Secondary | ICD-10-CM

## 2022-04-25 DIAGNOSIS — N1831 Chronic kidney disease, stage 3a: Secondary | ICD-10-CM | POA: Diagnosis not present

## 2022-04-25 DIAGNOSIS — I10 Essential (primary) hypertension: Secondary | ICD-10-CM

## 2022-04-25 DIAGNOSIS — Z0001 Encounter for general adult medical examination with abnormal findings: Secondary | ICD-10-CM

## 2022-04-25 LAB — CBC WITH DIFFERENTIAL/PLATELET
Basophils Absolute: 0 10*3/uL (ref 0.0–0.1)
Basophils Relative: 0.5 % (ref 0.0–3.0)
Eosinophils Absolute: 0.2 10*3/uL (ref 0.0–0.7)
Eosinophils Relative: 2.9 % (ref 0.0–5.0)
HCT: 41.1 % (ref 39.0–52.0)
Hemoglobin: 14.1 g/dL (ref 13.0–17.0)
Lymphocytes Relative: 23.3 % (ref 12.0–46.0)
Lymphs Abs: 1.5 10*3/uL (ref 0.7–4.0)
MCHC: 34.4 g/dL (ref 30.0–36.0)
MCV: 86.6 fl (ref 78.0–100.0)
Monocytes Absolute: 0.6 10*3/uL (ref 0.1–1.0)
Monocytes Relative: 8.8 % (ref 3.0–12.0)
Neutro Abs: 4.1 10*3/uL (ref 1.4–7.7)
Neutrophils Relative %: 64.5 % (ref 43.0–77.0)
Platelets: 235 10*3/uL (ref 150.0–400.0)
RBC: 4.74 Mil/uL (ref 4.22–5.81)
RDW: 14.6 % (ref 11.5–15.5)
WBC: 6.4 10*3/uL (ref 4.0–10.5)

## 2022-04-25 LAB — VITAMIN B12: Vitamin B-12: 995 pg/mL — ABNORMAL HIGH (ref 211–911)

## 2022-04-25 LAB — LIPID PANEL
Cholesterol: 124 mg/dL (ref 0–200)
HDL: 46.4 mg/dL
LDL Cholesterol: 65 mg/dL (ref 0–99)
NonHDL: 78.09
Total CHOL/HDL Ratio: 3
Triglycerides: 67 mg/dL (ref 0.0–149.0)
VLDL: 13.4 mg/dL (ref 0.0–40.0)

## 2022-04-25 LAB — URINALYSIS, ROUTINE W REFLEX MICROSCOPIC
Hgb urine dipstick: NEGATIVE
Ketones, ur: NEGATIVE
Leukocytes,Ua: NEGATIVE
Nitrite: NEGATIVE
Specific Gravity, Urine: 1.025 (ref 1.000–1.030)
Total Protein, Urine: 30 — AB
Urine Glucose: NEGATIVE
Urobilinogen, UA: 0.2 (ref 0.0–1.0)
pH: 5.5 (ref 5.0–8.0)

## 2022-04-25 LAB — MICROALBUMIN / CREATININE URINE RATIO
Creatinine,U: 187.8 mg/dL
Microalb Creat Ratio: 22 mg/g (ref 0.0–30.0)
Microalb, Ur: 41.3 mg/dL — ABNORMAL HIGH (ref 0.0–1.9)

## 2022-04-25 LAB — HEPATIC FUNCTION PANEL
ALT: 13 U/L (ref 0–53)
AST: 16 U/L (ref 0–37)
Albumin: 4.5 g/dL (ref 3.5–5.2)
Alkaline Phosphatase: 71 U/L (ref 39–117)
Bilirubin, Direct: 0.2 mg/dL (ref 0.0–0.3)
Total Bilirubin: 0.6 mg/dL (ref 0.2–1.2)
Total Protein: 6.6 g/dL (ref 6.0–8.3)

## 2022-04-25 LAB — TSH: TSH: 2.16 u[IU]/mL (ref 0.35–5.50)

## 2022-04-25 LAB — BASIC METABOLIC PANEL
BUN: 22 mg/dL (ref 6–23)
CO2: 27 mEq/L (ref 19–32)
Calcium: 9.7 mg/dL (ref 8.4–10.5)
Chloride: 103 mEq/L (ref 96–112)
Creatinine, Ser: 1.24 mg/dL (ref 0.40–1.50)
GFR: 56.18 mL/min — ABNORMAL LOW (ref >60.00)
Glucose, Bld: 124 mg/dL — ABNORMAL HIGH (ref 70–99)
Potassium: 4.3 mEq/L (ref 3.5–5.1)
Sodium: 142 mEq/L (ref 135–145)

## 2022-04-25 LAB — PSA: PSA: 6.17 ng/mL — ABNORMAL HIGH (ref 0.10–4.00)

## 2022-04-25 LAB — HEMOGLOBIN A1C: Hgb A1c MFr Bld: 6 % (ref 4.6–6.5)

## 2022-04-25 LAB — VITAMIN D 25 HYDROXY (VIT D DEFICIENCY, FRACTURES): VITD: 70.66 ng/mL (ref 30.00–100.00)

## 2022-04-25 MED ORDER — AMLODIPINE BESYLATE 10 MG PO TABS: 10.0000 mg | ORAL_TABLET | Freq: Every day | ORAL | 3 refills | Status: DC

## 2022-04-25 MED ORDER — METFORMIN HCL ER 500 MG PO TB24: ORAL_TABLET | ORAL | 3 refills | Status: DC

## 2022-04-25 MED ORDER — IRBESARTAN 300 MG PO TABS: 300.0000 mg | ORAL_TABLET | Freq: Every day | ORAL | 3 refills | Status: DC

## 2022-04-25 MED ORDER — ROSUVASTATIN CALCIUM 20 MG PO TABS: 20.0000 mg | ORAL_TABLET | Freq: Every day | ORAL | 3 refills | Status: DC

## 2022-04-25 MED ORDER — OMEPRAZOLE 10 MG PO CPDR: 10.0000 mg | DELAYED_RELEASE_CAPSULE | Freq: Every day | ORAL | 3 refills | Status: DC

## 2022-04-25 MED ORDER — HYDROCHLOROTHIAZIDE 12.5 MG PO CAPS: 12.5000 mg | ORAL_CAPSULE | Freq: Every day | ORAL | 3 refills | Status: DC

## 2022-04-25 NOTE — Assessment & Plan Note (Signed)
Last vitamin D Lab Results  Component Value Date   VD25OH 66.40 10/25/2021   Stable, cont oral replacement

## 2022-04-25 NOTE — Assessment & Plan Note (Signed)
Lab Results  Component Value Date   CREATININE 1.52 (H) 12/20/2021   Stable overall, cont to avoid nephrotoxins

## 2022-04-25 NOTE — Assessment & Plan Note (Signed)
BP Readings from Last 3 Encounters:  04/25/22 116/62  12/20/21 (!) 142/65  10/25/21 138/66   Stable, pt to continue medical treatment norvasc 10 qd, avapro 300 mg qd

## 2022-04-25 NOTE — Assessment & Plan Note (Signed)
Lab Results  Component Value Date   LDLCALC 49 10/25/2021   Stable, pt to continue current statin crestor 20 mg qd

## 2022-04-25 NOTE — Patient Instructions (Signed)

## 2022-04-25 NOTE — Progress Notes (Signed)
Patient ID: Grant Bowers, male   DOB: 06/19/1945, 77 y.o.   MRN: [id number]         Chief Complaint:: wellness exam and ckd, dm, hld, htn, low vit d       HPI:  Grant Bowers is a 77 y.o. male here for wellness exam; will have second shingrx soon at pharmacy, o/w up to date                        Also overall doing well.  Pt denies chest pain, increased sob or doe, wheezing, orthopnea, PND, increased LE swelling, palpitations, dizziness or syncope.   Pt denies polydipsia, polyuria, or new focal neuro s/s.    Pt denies fever, wt loss, night sweats, loss of appetite, or other constitutional symptoms  Needs some med refills.  No new complaints   Wt Readings from Last 3 Encounters:  04/25/22 197 lb (89.4 kg)  12/20/21 190 lb 0.6 oz (86.2 kg)  10/25/21 194 lb (88 kg)   BP Readings from Last 3 Encounters:  04/25/22 116/62  12/20/21 (!) 142/65  10/25/21 138/66   Immunization History  Administered Date(s) Administered   Influenza Split 03/13/2011   Influenza Whole 02/11/2008, 01/08/2009, 02/11/2010   Influenza, High Dose Seasonal PF 01/10/2019, 03/31/2022   Influenza-Unspecified 02/01/2018, 03/22/2020, 03/14/2021   Moderna Sars-Covid-2 Vaccination 07/10/2019, 08/09/2019, 10/20/2019, 04/12/2020   Pneumococcal Conjugate-13 09/02/2013   Pneumococcal Polysaccharide-23 09/17/2014   Td 04/10/2000, 01/09/2007   Tdap 10/31/2018   Zoster Recombinat (Shingrix) 03/31/2022   Zoster, Live 04/08/2008   Health Maintenance Due  Topic Date Due   Diabetic kidney evaluation - Urine ACR  Never done   Medicare Annual Wellness (AWV)  09/17/2015      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   [place];   followed by [facility] cardiology--- dr Caryl Comes   Diabetes mellitus without complication ([place])    Dyspnea due to COVID-19    with exertion   ED (erectile dysfunction)    GERD  (gastroesophageal reflux disease)    History of 2019 novel coronavirus disease (COVID-19) 05/07/2020   per pt mild symptoms all resolved with exception DOE   History of cardiac murmur as a child    History of kidney stones    Hyperlipidemia    Hypertension    followed by pcp and cardiology   Hypogonadism male    Mild atherosclerosis of both carotid arteries 09/23/2015   duplex doppler in epic bilateral ICA 1-39%   OSA on CPAP    per pt uses nightly   Post-COVID chronic dyspnea    had covid 05-07-2020;  per pt never had issue prior to covid ;  PFT 10-19-2020 normal per dr c. young   Right ureteral calculus 02/11/2010   Skin cancer    Calf   Trifascicular block    alternating with BBB per dr Caryl Comes note;  s/p PPM 07-09-2020;   echo 07-29-2020 mild LVH, G1DD, ef 65-70%, moderate LAE, mild MR without stenosis   Past Surgical History:  Procedure Laterality Date   CARDIAC CATHETERIZATION  2009   [place] in [place] , told was normal   CARDIAC CATHETERIZATION  02/17/2010   '@[facility]'$   by dr Angelena Form--- no angiographic evidence cad, normal lvsf,  false positive stress test   COLONOSCOPY  12/07/2006   [facility] in [place]  CYSTOSCOPY WITH RETROGRADE PYELOGRAM, URETEROSCOPY AND STENT PLACEMENT Right 10/29/2020   Procedure: CYSTOSCOPY WITH RETROGRADE PYELOGRAM, URETEROSCOPY AND STENT PLACEMENT;  Surgeon: Franchot Gallo, MD;  Location: [facility];  Service: Urology;  Laterality: Right;   CYSTOSCOPY WITH RETROGRADE PYELOGRAM, URETEROSCOPY AND STENT PLACEMENT Right 11/09/2020   Procedure: CYSTOSCOPY WITH RIGHT  RETROGRADE PYELOGRAM, URETEROSCOPY AND STENT EXHANGE;  Surgeon: Remi Haggard, MD;  Location: [facility];  Service: Urology;  Laterality: Right;  30 MINS   CYSTOSCOPY WITH URETEROSCOPY, STONE BASKETRY AND STENT PLACEMENT  2012   CYSTOSCOPY/URETEROSCOPY/HOLMIUM LASER/STENT PLACEMENT Right 12/20/2021   Procedure: CYSTOSCOPY/URETEROSCOPY/HOLMIUM LASER/STENT PLACEMENT;  Surgeon:  Remi Haggard, MD;  Location: [facility];  Service: Urology;  Laterality: Right;  45 MINS   EXTRACORPOREAL SHOCK WAVE LITHOTRIPSY Right 04/29/2020   Procedure: EXTRACORPOREAL SHOCK WAVE LITHOTRIPSY (ESWL);  Surgeon: Robley Fries, MD;  Location: [facility];  Service: Urology;  Laterality: Right;   HOLMIUM LASER APPLICATION Right 05/19/7679   Procedure: HOLMIUM LASER APPLICATION;  Surgeon: Remi Haggard, MD;  Location: [facility];  Service: Urology;  Laterality: Right;   KNEE ARTHROSCOPY Left 2008   PACEMAKER IMPLANT N/A 07/09/2020   Procedure: PACEMAKER IMPLANT;  Surgeon: Deboraha Sprang, MD;  Location: [place] [facility];  Service: Cardiovascular;  Laterality: N/A;    reports that he has never smoked. He has never used smokeless tobacco. He reports current alcohol use. He reports that he does not use drugs. family history includes Colon polyps in his brother; Diabetes in his brother and father; Pancreatic cancer in his mother. No Known Allergies Current Outpatient Medications on File Prior to Visit  Medication Sig Dispense Refill   Alcohol Swabs (B-D SINGLE USE SWABS REGULAR) PADS Use as directed once per day E11.9 100 each 12   alfuzosin (UROXATRAL) 10 MG 24 hr tablet Take 10 mg by mouth at bedtime.     aspirin EC 81 MG tablet Take 81 mg by mouth daily. Swallow whole.     Blood Glucose Calibration (TRUE METRIX LEVEL 1) Low SOLN Use as directed once per day E11.9 1 each 1   Blood Glucose Monitoring Suppl (TRUE METRIX METER) DEVI USE AS DIRECTED TO test blood sugar EVERY DAY     cetirizine (ZYRTEC) 10 MG tablet TAKE 1 TABLET EVERY DAY 90 tablet 3   Cholecalciferol (VITAMIN D3 SUPER STRENGTH) 50 MCG (2000 UT) TABS Take 2,000 Units by mouth daily.     glucose blood (TRUE METRIX BLOOD GLUCOSE TEST) test strip TEST BLOOD SUGAR TWICE DAILY AS INSTRUCTED 200 strip 3   traMADol (ULTRAM) 50 MG tablet Take 1 tablet (50 mg total) by mouth every 6 (six) hours as  needed. 15 tablet 0   TRUEplus Lancets 33G MISC Use as directed once per day E11.9 100 each 12   zinc gluconate 50 MG tablet Take 50 mg by mouth daily.     No current facility-administered medications on file prior to visit.        ROS:  All others reviewed and negative.  Objective        PE:  BP 116/62 (BP Location: Right Arm, Patient Position: Sitting, Cuff Size: Large)   Pulse 80   Temp 98.1 F (36.7 C) (Oral)   Ht 6' (1.829 m)   Wt 197 lb (89.4 kg)   SpO2 97%   BMI 26.72 kg/m                 Constitutional: Pt appears  in NAD               HENT: Head: NCAT.                Right Ear: External ear normal.                 Left Ear: External ear normal.                Eyes: . Pupils are equal, round, and reactive to light. Conjunctivae and EOM are normal               Nose: without d/c or deformity               Neck: Neck supple. Gross normal ROM               Cardiovascular: Normal rate and regular rhythm.                 Pulmonary/Chest: Effort normal and breath sounds without rales or wheezing.                Abd:  Soft, NT, ND, + BS, no organomegaly               Neurological: Pt is alert. At baseline orientation, motor grossly intact               Skin: Skin is warm. No rashes, no other new lesions, LE edema - none               Psychiatric: Pt behavior is normal without agitation   Micro: none  Cardiac tracings I have personally interpreted today:  none  Pertinent Radiological findings (summarize): none   Lab Results  Component Value Date   WBC 8.0 12/20/2021   HGB 13.1 12/20/2021   HCT 38.7 (L) 12/20/2021   PLT 204 12/20/2021   GLUCOSE 173 (H) 12/20/2021   CHOL 107 10/25/2021   TRIG 79.0 10/25/2021   HDL 42.90 10/25/2021   LDLDIRECT 150.3 02/11/2010   LDLCALC 49 10/25/2021   ALT 16 10/25/2021   AST 20 10/25/2021   NA 142 12/20/2021   K 3.6 12/20/2021   CL 112 (H) 12/20/2021   CREATININE 1.52 (H) 12/20/2021   BUN 28 (H) 12/20/2021   CO2 23 12/20/2021    TSH 1.59 03/15/2021   PSA 4.80 (H) 06/01/2020   INR 1.2 ratio (H) 02/15/2010   HGBA1C 5.8 10/25/2021   Assessment/Plan:  Grant Bowers is a 77 y.o. White or Caucasian [1] male with  has a past medical history of BPH with elevated PSA, Cardiac pacemaker in situ (07/09/2020), Diabetes mellitus without complication ([place]), Dyspnea due to COVID-19, ED (erectile dysfunction), GERD (gastroesophageal reflux disease), History of 2019 novel coronavirus disease (COVID-19) (05/07/2020), History of cardiac murmur as a child, History of kidney stones, Hyperlipidemia, Hypertension, Hypogonadism male, Mild atherosclerosis of both carotid arteries (09/23/2015), OSA on CPAP, Post-COVID chronic dyspnea, Right ureteral calculus (02/11/2010), Skin cancer, and Trifascicular block.  Vitamin D deficiency Last vitamin D Lab Results  Component Value Date   VD25OH 66.40 10/25/2021   Stable, cont oral replacement   Encounter for well adult exam with abnormal findings Age and sex appropriate education and counseling updated with regular exercise and diet Referrals for preventative services - none needed Immunizations addressed - for second shingrix at pharmacy Smoking counseling  - none needed Evidence for depression or other mood disorder - none significant Most recent labs reviewed. I have personally reviewed and have  noted: 1) the patient's medical and social history 2) The patient's current medications and supplements 3) The patient's height, weight, and BMI have been recorded in the chart   CKD (chronic kidney disease) Lab Results  Component Value Date   CREATININE 1.52 (H) 12/20/2021   Stable overall, cont to avoid nephrotoxins   Diabetes ([place]) Lab Results  Component Value Date   HGBA1C 5.8 10/25/2021   Stable, pt to continue current medical treatment metformin ER 500 mg qd, for f/u A1c today   Hyperlipidemia Lab Results  Component Value Date   LDLCALC 49 10/25/2021   Stable, pt to  continue current statin crestor 20 mg qd   Hypertension, uncontrolled BP Readings from Last 3 Encounters:  04/25/22 116/62  12/20/21 (!) 142/65  10/25/21 138/66   Stable, pt to continue medical treatment norvasc 10 qd, avapro 300 mg qd  Followup: Return in about 6 months (around 10/24/2022).  Cathlean Cower, MD 04/25/2022 1:12 PM [facility]

## 2022-04-25 NOTE — Assessment & Plan Note (Signed)
Lab Results  Component Value Date   HGBA1C 5.8 10/25/2021   Stable, pt to continue current medical treatment metformin ER 500 mg qd, for f/u A1c today

## 2022-04-25 NOTE — Assessment & Plan Note (Signed)
Age and sex appropriate education and counseling updated with regular exercise and diet Referrals for preventative services - none needed Immunizations addressed - for second shingrix at pharmacy Smoking counseling  - none needed Evidence for depression or other mood disorder - none significant Most recent labs reviewed. I have personally reviewed and have noted: 1) the patient's medical and social history 2) The patient's current medications and supplements 3) The patient's height, weight, and BMI have been recorded in the chart

## 2022-05-05 ENCOUNTER — Other Ambulatory Visit: Payer: Self-pay | Admitting: Internal Medicine

## 2022-05-05 NOTE — Telephone Encounter (Signed)
Please refill as per office routine med refill policy (all routine meds to be refilled for 3 mo or monthly (per pt preference) up to one year from last visit, then month to month grace period for 3 mo, then further med refills will have to be denied)

## 2022-05-11 DIAGNOSIS — H40013 Open angle with borderline findings, low risk, bilateral: Secondary | ICD-10-CM | POA: Diagnosis not present

## 2022-05-11 DIAGNOSIS — E119 Type 2 diabetes mellitus without complications: Secondary | ICD-10-CM | POA: Diagnosis not present

## 2022-05-11 DIAGNOSIS — H43813 Vitreous degeneration, bilateral: Secondary | ICD-10-CM | POA: Diagnosis not present

## 2022-05-11 DIAGNOSIS — H25813 Combined forms of age-related cataract, bilateral: Secondary | ICD-10-CM | POA: Diagnosis not present

## 2022-05-11 NOTE — Progress Notes (Signed)
Remote pacemaker transmission.   

## 2022-06-29 DIAGNOSIS — N401 Enlarged prostate with lower urinary tract symptoms: Secondary | ICD-10-CM | POA: Diagnosis not present

## 2022-06-29 DIAGNOSIS — N3943 Post-void dribbling: Secondary | ICD-10-CM | POA: Diagnosis not present

## 2022-06-29 DIAGNOSIS — N281 Cyst of kidney, acquired: Secondary | ICD-10-CM | POA: Diagnosis not present

## 2022-07-11 ENCOUNTER — Ambulatory Visit (INDEPENDENT_AMBULATORY_CARE_PROVIDER_SITE_OTHER): Payer: Medicare HMO

## 2022-07-11 DIAGNOSIS — I441 Atrioventricular block, second degree: Secondary | ICD-10-CM

## 2022-07-11 LAB — CUP PACEART REMOTE DEVICE CHECK
Battery Remaining Longevity: 98 mo
Battery Remaining Percentage: 81 %
Battery Voltage: 2.99 V
Brady Statistic AP VP Percent: 21 %
Brady Statistic AP VS Percent: 1 %
Brady Statistic AS VP Percent: 78 %
Brady Statistic AS VS Percent: 1 %
Brady Statistic RA Percent Paced: 20 %
Brady Statistic RV Percent Paced: 99 %
Date Time Interrogation Session: 20240402020013
Implantable Lead Connection Status: 753985
Implantable Lead Connection Status: 753985
Implantable Lead Implant Date: 20220401
Implantable Lead Implant Date: 20220401
Implantable Lead Location: 753859
Implantable Lead Location: 753860
Implantable Lead Model: 3830
Implantable Pulse Generator Implant Date: 20220401
Lead Channel Impedance Value: 510 Ohm
Lead Channel Impedance Value: 540 Ohm
Lead Channel Pacing Threshold Amplitude: 0.5 V
Lead Channel Pacing Threshold Amplitude: 1.125 V
Lead Channel Pacing Threshold Pulse Width: 0.4 ms
Lead Channel Pacing Threshold Pulse Width: 0.4 ms
Lead Channel Sensing Intrinsic Amplitude: 2.8 mV
Lead Channel Sensing Intrinsic Amplitude: 7 mV
Lead Channel Setting Pacing Amplitude: 1.375
Lead Channel Setting Pacing Amplitude: 2 V
Lead Channel Setting Pacing Pulse Width: 0.4 ms
Lead Channel Setting Sensing Sensitivity: 2 mV
Pulse Gen Model: 2272
Pulse Gen Serial Number: 3912376

## 2022-07-14 ENCOUNTER — Encounter: Payer: Self-pay | Admitting: Cardiovascular Disease

## 2022-07-14 ENCOUNTER — Encounter: Payer: Medicare PPO | Admitting: Cardiovascular Disease

## 2022-07-14 ENCOUNTER — Encounter: Payer: Medicare PPO | Admitting: Internal Medicine

## 2022-07-14 ENCOUNTER — Ambulatory Visit: Payer: Medicare HMO | Attending: Internal Medicine | Admitting: Cardiovascular Disease

## 2022-07-14 VITALS — BP 122/56 | HR 76 | Ht 72.0 in | Wt 204.4 lb

## 2022-07-14 DIAGNOSIS — R001 Bradycardia, unspecified: Secondary | ICD-10-CM

## 2022-07-14 DIAGNOSIS — Z950 Presence of cardiac pacemaker: Secondary | ICD-10-CM | POA: Diagnosis not present

## 2022-07-14 NOTE — Progress Notes (Signed)
PCP: [name], MD   Primary EP:  Dr Nelly Laurence  Grant Bowers is a 77 y.o. male who presents today for routine electrophysiology followup.  Since last being seen in our clinic, the patient reports doing very well.  He has occasional dizziness and SOB with moderate activity.  Today, he denies symptoms of palpitations, chest pain,  presyncope, or syncope.  The patient is otherwise without complaint today. he has no device related complaints -- no new tenderness, drainage, redness.   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   [facility];   followed by [facility] cardiology--- dr Graciela Husbands   Diabetes mellitus without complication    Dyspnea due to COVID-19    with exertion   ED (erectile dysfunction)    GERD (gastroesophageal reflux disease)    History of 2019 novel coronavirus disease (COVID-19) 05/07/2020   per pt mild symptoms all resolved with exception DOE   History of cardiac murmur as a child    History of kidney stones    Hyperlipidemia    Hypertension    followed by pcp and cardiology   Hypogonadism male    Mild atherosclerosis of both carotid arteries 09/23/2015   duplex doppler in epic bilateral ICA 1-39%   OSA on CPAP    per pt uses nightly   Post-COVID chronic dyspnea    had covid 05-07-2020;  per pt never had issue prior to covid ;  PFT 10-19-2020 normal per dr c. young   Right ureteral calculus 02/11/2010   Skin cancer    Calf   Trifascicular block    alternating with BBB per dr Graciela Husbands note;  s/p PPM 07-09-2020;   echo 07-29-2020 mild LVH, G1DD, ef 65-70%, moderate LAE, mild MR without stenosis   Past Surgical History:  Procedure Laterality Date   CARDIAC CATHETERIZATION  2009   per pt in [place] , told was normal   CARDIAC CATHETERIZATION  02/17/2010   @[facility]   by dr Clifton James--- no angiographic evidence cad, normal lvsf,  false positive stress test    COLONOSCOPY  12/07/2006   [facility] in [place]    CYSTOSCOPY WITH RETROGRADE PYELOGRAM, URETEROSCOPY AND STENT PLACEMENT Right 10/29/2020   Procedure: CYSTOSCOPY WITH RETROGRADE PYELOGRAM, URETEROSCOPY AND STENT PLACEMENT;  Surgeon: Marcine Matar, MD;  Location: [facility];  Service: Urology;  Laterality: Right;   CYSTOSCOPY WITH RETROGRADE PYELOGRAM, URETEROSCOPY AND STENT PLACEMENT Right 11/09/2020   Procedure: CYSTOSCOPY WITH RIGHT  RETROGRADE PYELOGRAM, URETEROSCOPY AND STENT EXHANGE;  Surgeon: Belva Agee, MD;  Location: [facility];  Service: Urology;  Laterality: Right;  30 MINS   CYSTOSCOPY WITH URETEROSCOPY, STONE BASKETRY AND STENT PLACEMENT  2012   CYSTOSCOPY/URETEROSCOPY/HOLMIUM LASER/STENT PLACEMENT Right 12/20/2021   Procedure: CYSTOSCOPY/URETEROSCOPY/HOLMIUM LASER/STENT PLACEMENT;  Surgeon: Belva Agee, MD;  Location: [facility];  Service: Urology;  Laterality: Right;  45 MINS   EXTRACORPOREAL SHOCK WAVE LITHOTRIPSY Right 04/29/2020   Procedure: EXTRACORPOREAL SHOCK WAVE LITHOTRIPSY (ESWL);  Surgeon: Noel Christmas, MD;  Location: [facility];  Service: Urology;  Laterality: Right;   HOLMIUM LASER APPLICATION Right 11/09/2020   Procedure: HOLMIUM LASER APPLICATION;  Surgeon: Belva Agee, MD;  Location: [facility];  Service: Urology;  Laterality: Right;   KNEE ARTHROSCOPY Left 2008   PACEMAKER IMPLANT N/A 07/09/2020   Procedure: PACEMAKER IMPLANT;  Surgeon: Graciela Husbands,  Salvatore DecentSteven C, MD;  Location: [facility];  Service: Cardiovascular;  Laterality: N/A;    ROS- all systems are reviewed and negative except as per HPI above  Current Outpatient Medications  Medication Sig Dispense Refill   alfuzosin (UROXATRAL) 10 MG 24 hr tablet Take 10 mg by mouth at bedtime.     amLODipine (NORVASC) 10 MG tablet Take 1 tablet (10 mg total) by mouth daily. Annual appt due in January must see provider for future refills 90 tablet 3   aspirin  EC 81 MG tablet Take 81 mg by mouth daily. Swallow whole.     cetirizine (ZYRTEC) 10 MG tablet TAKE 1 TABLET EVERY DAY 90 tablet 3   Cholecalciferol (VITAMIN D3 SUPER STRENGTH) 50 MCG (2000 UT) TABS Take 2,000 Units by mouth daily.     fluticasone (FLONASE ALLERGY RELIEF) 50 MCG/ACT nasal spray Place 2 sprays into both nostrils daily.     hydrochlorothiazide (MICROZIDE) 12.5 MG capsule Take 1 capsule (12.5 mg total) by mouth daily. 90 capsule 3   irbesartan (AVAPRO) 300 MG tablet TAKE 1 TABLET AT BEDTIME. [place]. MUST SEE PROVIDER FOR FUTURE REFILLS 90 tablet 3   l-methylfolate-B6-B12 (METANX) 3-35-2 MG TABS tablet Take 1 tablet by mouth daily.     metFORMIN (GLUCOPHAGE-XR) 500 MG 24 hr tablet TAKE 1 TABLET EVERY DAY WITH BREAKFAST 90 tablet 3   omeprazole (PRILOSEC) 10 MG capsule Take 1 capsule (10 mg total) by mouth daily. Annual appt due in January must see provider for future refills 90 capsule 3   rosuvastatin (CRESTOR) 20 MG tablet Take 1 tablet (20 mg total) by mouth daily. 90 tablet 3   traMADol (ULTRAM) 50 MG tablet Take 1 tablet (50 mg total) by mouth every 6 (six) hours as needed. 15 tablet 0   zinc gluconate 50 MG tablet Take 50 mg by mouth daily.     Alcohol Swabs (B-D SINGLE USE SWABS REGULAR) PADS Use as directed once per day E11.9 100 each 12   Blood Glucose Calibration (TRUE METRIX LEVEL 1) Low SOLN Use as directed once per day E11.9 1 each 1   Blood Glucose Monitoring Suppl (TRUE METRIX METER) DEVI USE AS DIRECTED TO test blood sugar EVERY DAY     glucose blood (TRUE METRIX BLOOD GLUCOSE TEST) test strip TEST BLOOD SUGAR TWICE DAILY AS INSTRUCTED 200 strip 3   TRUEplus Lancets 33G MISC Use as directed once per day E11.9 100 each 12   No current facility-administered medications for this visit.    Physical Exam: Vitals:   07/14/22 1533  BP: (!) 122/56  Pulse: 76  SpO2: 94%  Weight: 204 lb 6.4 oz (92.7 kg)  Height: 6' (1.829 m)    GEN- The patient is  well appearing, alert and oriented x 3 today.   Head- normocephalic, atraumatic Eyes-  Sclera clear, conjunctiva pink Ears- hearing intact Oropharynx- clear Lungs- Clear to ausculation bilaterally, normal work of breathing Chest- pacemaker pocket is well healed Heart- Regular rate and rhythm, no murmurs, rubs or gallops, PMI not laterally displaced GI- soft, NT, ND, + BS Extremities- no clubbing, cyanosis, or edema  Pacemaker interrogation- reviewed in detail today,  See PACEART report    Assessment and Plan:  1. Symptomatic trifascicular block Normal pacemaker function See Pace Art report No changes today he is not device dependant today  2. HTN Controlled today No changes  Regular exercise and increased activity advised  Return in a year  [address]  Lorane GellE Darwin Guastella, MD 07/14/2022 4:08 PM

## 2022-07-14 NOTE — Patient Instructions (Signed)
Medication Instructions:  Continue all current medications.  Labwork: none  Testing/Procedures: none  Follow-Up: 1 year - Dr.  Mealor   Any Other Special Instructions Will Be Listed Below (If Applicable).   If you need a refill on your cardiac medications before your next appointment, please call your pharmacy.; 

## 2022-08-22 NOTE — Progress Notes (Signed)
Remote pacemaker transmission.   

## 2022-09-13 ENCOUNTER — Other Ambulatory Visit: Payer: Self-pay | Admitting: Internal Medicine

## 2022-09-14 ENCOUNTER — Other Ambulatory Visit: Payer: Self-pay

## 2022-09-18 DIAGNOSIS — L821 Other seborrheic keratosis: Secondary | ICD-10-CM | POA: Diagnosis not present

## 2022-09-18 DIAGNOSIS — D485 Neoplasm of uncertain behavior of skin: Secondary | ICD-10-CM | POA: Diagnosis not present

## 2022-09-18 DIAGNOSIS — L820 Inflamed seborrheic keratosis: Secondary | ICD-10-CM | POA: Diagnosis not present

## 2022-10-10 ENCOUNTER — Ambulatory Visit (INDEPENDENT_AMBULATORY_CARE_PROVIDER_SITE_OTHER): Payer: Medicare HMO

## 2022-10-10 DIAGNOSIS — I441 Atrioventricular block, second degree: Secondary | ICD-10-CM

## 2022-10-10 LAB — CUP PACEART REMOTE DEVICE CHECK
Battery Remaining Longevity: 94 mo
Battery Remaining Percentage: 78 %
Battery Voltage: 2.99 V
Brady Statistic AP VP Percent: 33 %
Brady Statistic AP VS Percent: 1 %
Brady Statistic AS VP Percent: 64 %
Brady Statistic AS VS Percent: 2.8 %
Brady Statistic RA Percent Paced: 31 %
Brady Statistic RV Percent Paced: 97 %
Date Time Interrogation Session: 20240702020013
Implantable Lead Connection Status: 753985
Implantable Lead Connection Status: 753985
Implantable Lead Implant Date: 20220401
Implantable Lead Implant Date: 20220401
Implantable Lead Location: 753859
Implantable Lead Location: 753860
Implantable Lead Model: 3830
Implantable Pulse Generator Implant Date: 20220401
Lead Channel Impedance Value: 530 Ohm
Lead Channel Impedance Value: 550 Ohm
Lead Channel Pacing Threshold Amplitude: 0.75 V
Lead Channel Pacing Threshold Amplitude: 1 V
Lead Channel Pacing Threshold Pulse Width: 0.4 ms
Lead Channel Pacing Threshold Pulse Width: 0.4 ms
Lead Channel Sensing Intrinsic Amplitude: 2.4 mV
Lead Channel Sensing Intrinsic Amplitude: 6.4 mV
Lead Channel Setting Pacing Amplitude: 1.25 V
Lead Channel Setting Pacing Amplitude: 2 V
Lead Channel Setting Pacing Pulse Width: 0.4 ms
Lead Channel Setting Sensing Sensitivity: 2 mV
Pulse Gen Model: 2272
Pulse Gen Serial Number: 3912376

## 2022-11-03 NOTE — Progress Notes (Signed)
Remote pacemaker transmission.   

## 2023-01-02 ENCOUNTER — Ambulatory Visit: Payer: Medicare HMO | Admitting: "Endocrinology

## 2023-01-02 ENCOUNTER — Encounter: Payer: Self-pay | Admitting: "Endocrinology

## 2023-01-02 VITALS — BP 132/50 | HR 64 | Ht 72.0 in | Wt 202.2 lb

## 2023-01-02 DIAGNOSIS — Z7984 Long term (current) use of oral hypoglycemic drugs: Secondary | ICD-10-CM | POA: Diagnosis not present

## 2023-01-02 DIAGNOSIS — E1165 Type 2 diabetes mellitus with hyperglycemia: Secondary | ICD-10-CM | POA: Diagnosis not present

## 2023-01-02 DIAGNOSIS — E782 Mixed hyperlipidemia: Secondary | ICD-10-CM

## 2023-01-02 DIAGNOSIS — I10 Essential (primary) hypertension: Secondary | ICD-10-CM

## 2023-01-02 LAB — POCT GLYCOSYLATED HEMOGLOBIN (HGB A1C): HbA1c, POC (controlled diabetic range): 6.5 % (ref 0.0–7.0)

## 2023-01-02 MED ORDER — RYBELSUS 3 MG PO TABS: 3.0000 mg | ORAL_TABLET | Freq: Every day | ORAL | 1 refills | Status: DC

## 2023-01-02 NOTE — Patient Instructions (Signed)

## 2023-01-02 NOTE — Progress Notes (Signed)
Endocrinology Consult Note       01/02/2023, 5:35 PM   Subjective:    Patient ID: Grant Bowers, male    DOB: 1945-09-10.  Grant Bowers is being seen in consultation for management of currently uncontrolled symptomatic diabetes requested by  [name], MD.   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   [facility];   followed by EP cardiology--- dr Graciela Husbands   Diabetes mellitus without complication (HCC)    Dyspnea due to COVID-19    with exertion   ED (erectile dysfunction)    GERD (gastroesophageal reflux disease)    History of 2019 novel coronavirus disease (COVID-19) 05/07/2020   per pt mild symptoms all resolved with exception DOE   History of cardiac murmur as a child    History of kidney stones    Hyperlipidemia    Hypertension    followed by pcp and cardiology   Hypogonadism male    Mild atherosclerosis of both carotid arteries 09/23/2015   duplex doppler in epic bilateral ICA 1-39%   OSA on CPAP    per pt uses nightly   Post-COVID chronic dyspnea    had covid 05-07-2020;  per pt never had issue prior to covid ;  PFT 10-19-2020 normal per dr c. young   Right ureteral calculus 02/11/2010   Skin cancer    Calf   Trifascicular block    alternating with BBB per dr Graciela Husbands note;  s/p PPM 07-09-2020;   echo 07-29-2020 mild LVH, G1DD, ef 65-70%, moderate LAE, mild MR without stenosis    Past Surgical History:  Procedure Laterality Date   CARDIAC CATHETERIZATION  2009   per pt in [place] , told was normal   CARDIAC CATHETERIZATION  02/17/2010   @[facility]   by dr Clifton James--- no angiographic evidence cad, normal lvsf,  false positive stress test   COLONOSCOPY  12/07/2006   [facility] in [place]    CYSTOSCOPY WITH RETROGRADE PYELOGRAM, URETEROSCOPY AND STENT PLACEMENT Right 10/29/2020    Procedure: CYSTOSCOPY WITH RETROGRADE PYELOGRAM, URETEROSCOPY AND STENT PLACEMENT;  Surgeon: Marcine Matar, MD;  Location: [facility];  Service: Urology;  Laterality: Right;   CYSTOSCOPY WITH RETROGRADE PYELOGRAM, URETEROSCOPY AND STENT PLACEMENT Right 11/09/2020   Procedure: CYSTOSCOPY WITH RIGHT  RETROGRADE PYELOGRAM, URETEROSCOPY AND STENT EXHANGE;  Surgeon: Belva Agee, MD;  Location: [facility];  Service: Urology;  Laterality: Right;  30 MINS   CYSTOSCOPY WITH URETEROSCOPY, STONE BASKETRY AND STENT PLACEMENT  2012   CYSTOSCOPY/URETEROSCOPY/HOLMIUM LASER/STENT PLACEMENT Right 12/20/2021   Procedure: CYSTOSCOPY/URETEROSCOPY/HOLMIUM LASER/STENT PLACEMENT;  Surgeon: Belva Agee, MD;  Location: [facility];  Service: Urology;  Laterality: Right;  45 MINS   EXTRACORPOREAL SHOCK WAVE LITHOTRIPSY Right 04/29/2020   Procedure: EXTRACORPOREAL SHOCK WAVE LITHOTRIPSY (ESWL);  Surgeon: Noel Christmas, MD;  Location[facility];  Service: Urology;  Laterality: Right;   HOLMIUM LASER APPLICATION Right 11/09/2020   Procedure: HOLMIUM LASER APPLICATION;  Surgeon: Belva Agee, MD;  Location: [facility];  Service: Urology;  Laterality: Right;   KNEE ARTHROSCOPY Left 2008   PACEMAKER IMPLANT N/A 07/09/2020   Procedure: PACEMAKER IMPLANT;  Surgeon: Duke Salvia, MD;  Location: [facility];  Service: Cardiovascular;  Laterality: N/A;    Social History   Socioeconomic History   Marital status: Married    Spouse name: Not on file   Number of children: Not on file   Years of education: Not on file   Highest education level: Not on file  Occupational History   Not on file  Tobacco Use   Smoking status: Never   Smokeless tobacco: Never  Vaping Use   Vaping status: Never Used  Substance and Sexual Activity   Alcohol use: Not Currently    Comment: very rare    Drug use: Never   Sexual activity: Not on file  Other Topics Concern    Not on file  Social History Narrative   Live with wife; one grown in [place]   2 grand children   One level home   retired   International aid/development worker of [employer] Strain: Not on file  Food Insecurity: Not on file  Transportation Needs: Not on file  Physical Activity: Not on file  Stress: Not on file  Social Connections: Not on file    Family History  Problem Relation Age of Onset   Pancreatic cancer Mother    Diabetes Father    Diabetes Brother    Colon polyps Brother    Colon cancer Neg Hx    Esophageal cancer Neg Hx    Rectal cancer Neg Hx    Stomach cancer Neg Hx     Outpatient Encounter Medications as of 01/02/2023  Medication Sig   Semaglutide (RYBELSUS) 3 MG TABS Take 1 tablet (3 mg total) by mouth daily before breakfast.   Alcohol Swabs (B-D SINGLE USE SWABS REGULAR) PADS Use as directed once per day E11.9   alfuzosin (UROXATRAL) 10 MG 24 hr tablet Take 10 mg by mouth at bedtime.   amLODipine (NORVASC) 10 MG tablet Take 1 tablet (10 mg total) by mouth daily. Annual appt due in January must see provider for future refills   aspirin EC 81 MG tablet Take 81 mg by mouth daily. Swallow whole.   Blood Glucose Calibration (TRUE METRIX LEVEL 1) Low SOLN Use as directed once per day E11.9   Blood Glucose Monitoring Suppl (TRUE METRIX METER) DEVI USE AS DIRECTED TO test blood sugar EVERY DAY   cetirizine (ZYRTEC) 10 MG tablet TAKE 1 TABLET EVERY DAY   Cholecalciferol (VITAMIN D3 SUPER STRENGTH) 50 MCG (2000 UT) TABS Take 2,000 Units by mouth daily.   fluticasone (FLONASE ALLERGY RELIEF) 50 MCG/ACT nasal spray Place 2 sprays into both nostrils daily.   glucose blood (TRUE METRIX BLOOD GLUCOSE TEST) test strip TEST BLOOD SUGAR TWO TIMES DAILY AS INSTRUCTED   hydrochlorothiazide (MICROZIDE) 12.5 MG capsule Take 1 capsule (12.5 mg total) by mouth daily.   irbesartan (AVAPRO) 300 MG tablet TAKE 1 TABLET AT BEDTIME. ANNUAL APPT DUE IN [place]. MUST SEE PROVIDER FOR  FUTURE REFILLS   l-methylfolate-B6-B12 (METANX) 3-35-2 MG TABS tablet Take 1 tablet by mouth daily.   metFORMIN (GLUCOPHAGE-XR) 500 MG 24 hr tablet TAKE 1 TABLET EVERY DAY WITH BREAKFAST   omeprazole (PRILOSEC) 10 MG capsule Take 1 capsule (10 mg total) by mouth daily. Annual appt due in January must see provider for future refills   rosuvastatin (CRESTOR) 20  MG tablet Take 1 tablet (20 mg total) by mouth daily.   TRUEplus Lancets 33G MISC Use as directed once per day E11.9   zinc gluconate 50 MG tablet Take 50 mg by mouth daily.   No facility-administered encounter medications on file as of 01/02/2023.    ALLERGIES: No Known Allergies  VACCINATION STATUS: Immunization History  Administered Date(s) Administered   Influenza Split 03/13/2011   Influenza Whole 02/11/2008, 01/08/2009, 02/11/2010   Influenza, High Dose Seasonal PF 01/10/2019, 03/31/2022   Influenza-Unspecified 02/01/2018, 03/22/2020, 03/14/2021   Moderna Sars-Covid-2 Vaccination 07/10/2019, 08/09/2019, 10/20/2019, 04/12/2020   Pneumococcal Conjugate-13 09/02/2013   Pneumococcal Polysaccharide-23 09/17/2014   Td 04/10/2000, 01/09/2007   Tdap 10/31/2018   Zoster Recombinant(Shingrix) 03/31/2022   Zoster, Live 04/08/2008    Diabetes He presents for his initial diabetic visit. He has type 2 diabetes mellitus. Onset time: He was diagnosed at approximate age of 40 years. There are no hypoglycemic associated symptoms. Pertinent negatives for hypoglycemia include no headaches, seizures or tremors. There are no diabetic associated symptoms. Pertinent negatives for diabetes include no chest pain and no polydipsia. There are no hypoglycemic complications. Symptoms are stable. Diabetic complications include heart disease. Risk factors for coronary artery disease include dyslipidemia, diabetes mellitus, hypertension and family history. Current diabetic treatments: He is currently on metformin 500 mg p.o. once a day. His weight is  fluctuating minimally. He is following a generally unhealthy diet. When asked about meal planning, he reported none. He has not had a previous visit with a dietitian. He participates in exercise intermittently. (He does not monitor blood glucose regularly.  His point-of-care A1c was 6.5% today.) An ACE inhibitor/angiotensin II receptor blocker is being taken. Eye exam is current.  Hyperlipidemia This is a chronic problem. The current episode started more than 1 year ago. The problem is uncontrolled. Exacerbating diseases include diabetes. Pertinent negatives include no chest pain, myalgias or shortness of breath. Risk factors for coronary artery disease include dyslipidemia, diabetes mellitus, hypertension, male sex and family history.  Hypertension This is a chronic problem. The current episode started more than 1 year ago. The problem is controlled. Pertinent negatives include no chest pain, headaches, palpitations or shortness of breath. Risk factors for coronary artery disease include dyslipidemia, diabetes mellitus, family history and male gender. Past treatments include angiotensin blockers.     Review of Systems  Constitutional:  Negative for chills and fever.  Respiratory:  Negative for cough and shortness of breath.   Cardiovascular:  Negative for chest pain and palpitations.       No Shortness of breath  Gastrointestinal:  Negative for abdominal pain, diarrhea, nausea and vomiting.  Endocrine: Negative for polydipsia.  Genitourinary:  Negative for frequency, hematuria and urgency.  Musculoskeletal:  Negative for myalgias.  Skin:  Negative for rash.  Neurological:  Negative for tremors, seizures and headaches.  Hematological:  Does not bruise/bleed easily.  Psychiatric/Behavioral:  Negative for hallucinations and suicidal ideas.     Objective:       01/02/2023    1:23 PM 07/14/2022    3:33 PM 04/25/2022   10:11 AM  Vitals with BMI  Height 6\' 0"  6\' 0"  6\' 0"   Weight 202 lbs 3 oz  204 lbs 6 oz 197 lbs  BMI 27.42 27.72 26.71  Systolic 132 122 782  Diastolic 50 56 62  Pulse 64 76 80    BP (!) 132/50   Pulse 64   Ht 6' (1.829 m)   Wt 202 lb 3.2 oz (  91.7 kg)   BMI 27.42 kg/m   Wt Readings from Last 3 Encounters:  01/02/23 202 lb 3.2 oz (91.7 kg)  07/14/22 204 lb 6.4 oz (92.7 kg)  04/25/22 197 lb (89.4 kg)     Physical Exam Constitutional:      General: He is not in acute distress.    Appearance: He is well-developed.  HENT:     Head: Normocephalic and atraumatic.  Neck:     Thyroid: No thyromegaly.     Trachea: No tracheal deviation.  Cardiovascular:     Rate and Rhythm: Normal rate.     Pulses:          Dorsalis pedis pulses are 1+ on the right side and 1+ on the left side.       Posterior tibial pulses are 1+ on the right side and 1+ on the left side.     Heart sounds: Normal heart sounds, S1 normal and S2 normal. No murmur heard.    No gallop.  Pulmonary:     Effort: No respiratory distress.     Breath sounds: Normal breath sounds. No wheezing.  Abdominal:     General: Bowel sounds are normal. There is no distension.     Palpations: Abdomen is soft.     Tenderness: There is no abdominal tenderness. There is no guarding.  Musculoskeletal:     Right shoulder: No swelling or deformity.     Cervical back: Normal range of motion and neck supple.  Skin:    General: Skin is warm and dry.     Findings: No rash.     Nails: There is no clubbing.  Neurological:     Mental Status: He is alert and oriented to person, place, and time.     Cranial Nerves: No cranial nerve deficit.     Sensory: No sensory deficit.     Gait: Gait normal.     Deep Tendon Reflexes: Reflexes are normal and symmetric.  Psychiatric:        Speech: Speech normal.        Behavior: Behavior normal. Behavior is cooperative.        Thought Content: Thought content normal.        Judgment: Judgment normal.       CMP ( most recent) CMP     Component Value Date/Time   NA  142 04/25/2022 1106   NA 145 (H) 06/28/2020 1303   K 4.3 04/25/2022 1106   CL 103 04/25/2022 1106   CO2 27 04/25/2022 1106   GLUCOSE 124 (H) 04/25/2022 1106   BUN 22 04/25/2022 1106   BUN 21 06/28/2020 1303   CREATININE 1.24 04/25/2022 1106   CREATININE 1.23 (H) 11/06/2019 1157   CALCIUM 9.7 04/25/2022 1106   PROT 6.6 04/25/2022 1106   ALBUMIN 4.5 04/25/2022 1106   AST 16 04/25/2022 1106   ALT 13 04/25/2022 1106   ALKPHOS 71 04/25/2022 1106   BILITOT 0.6 04/25/2022 1106   GFR 56.18 (L) 04/25/2022 1106   EGFR 58 (L) 06/28/2020 1303   GFRNONAA 47 (L) 12/20/2021 0558   GFRNONAA 57 (L) 11/06/2019 1157     Diabetic Labs (most recent): Lab Results  Component Value Date   HGBA1C 6.5 01/02/2023   HGBA1C 6.0 04/25/2022   HGBA1C 5.8 10/25/2021   MICROALBUR 41.3 (H) 04/25/2022     Lipid Panel ( most recent) Lipid Panel     Component Value Date/Time   CHOL 124 04/25/2022 1106   TRIG 67.0  04/25/2022 1106   HDL 46.40 04/25/2022 1106   CHOLHDL 3 04/25/2022 1106   VLDL 13.4 04/25/2022 1106   LDLCALC 65 04/25/2022 1106   LDLCALC 57 11/06/2019 1157   LDLDIRECT 150.3 02/11/2010 1211      Lab Results  Component Value Date   TSH 2.16 04/25/2022   TSH 1.59 03/15/2021   TSH 1.68 11/16/2020   TSH 2.43 06/01/2020   TSH 1.07 11/06/2019   TSH 1.18 10/31/2018   TSH 1.43 08/14/2017   TSH 1.48 08/08/2016   TSH 1.19 09/23/2015   TSH 1.24 09/17/2014       Assessment & Plan:   1. Type 2 diabetes mellitus with hyperglycemia, without long-term current use of insulin (HCC)   - Grant Bowers has currently uncontrolled symptomatic type 2 DM since  77 years of age,  with most recent A1c of 6.5 %. Recent labs reviewed. - I had a long discussion with him about the possible risk factors and  the pathology behind its diabetes and its complications. -his diabetes is complicated by comorbid hypertension/hyperlipidemia, sedentary life and he remains at a high risk for more acute and chronic  complications which include CAD, CVA, CKD, retinopathy, and neuropathy. These are all discussed in detail with him.  - I discussed all available options of managing his diabetes including de-escalation of medications. I have counseled him on Food as Medicine by adopting a Whole Food , Plant Predominant  ( WFPP) nutrition as recommended by [employer] of Lifestyle Medicine. Patient is encouraged to switch to  unprocessed or minimally processed  complex starch, adequate protein intake (mainly plant source), minimal liquid fat, plenty of fruits, and vegetables. -  he is advised to stick to a routine mealtimes to eat 3 complete meals a day and snack only when necessary ( to snack only to correct hypoglycemia BG <70 day time or <100 at night).   - he acknowledges that there is a room for improvement in his food and drink choices. - Further Specific Suggestion is made for him to avoid simple carbohydrates  from his diet including Cakes, Sweet Desserts, Ice Cream, Soda (diet and regular), Sweet Tea, Candies, Chips, Cookies, Store Bought Juices, Alcohol ,  Artificial Sweeteners,  Coffee Creamer, and "Sugar-free" Products. This will help patient to have more stable blood glucose profile and potentially avoid unintended weight gain.   - he will be scheduled with Norm Salt, RDN, CDE for individualized diabetes education.  - I have approached him with the following individualized plan to manage  his diabetes and patient agrees:   -Admittedly, he states that he will not engage for optimal lifestyle medicine nutrition and asking for more medications.  -Since he has taken Rybelsus before, would like to have prescription again.  I discussed and prescribed Rybelsus 3 mg p.o. daily before breakfast, side effects and precautions discussed with him.  -He is also advised to continue metformin 500 mg p.o. daily at breakfast.  -He is willing and advised to monitor blood glucose twice a day-daily before breakfast  and at bedtime. - he is encouraged to call clinic for blood glucose levels less than 70 or above 200 mg /dl. - Specific targets for  A1c;  LDL, HDL,  and Triglycerides were discussed with the patient.  2) Blood Pressure /Hypertension:  his blood pressure is  controlled to target.   he is advised to continue his current medications including irbesartan 300 mg p.o. daily with breakfast . 3) Lipids/Hyperlipidemia:   Review of his  recent lipid panel showed uncontrolled  LDL at 150 .  he  is advised to continue    Crestor 20 mg daily at bedtime.  Side effects and precautions discussed with him.  4)  Weight/Diet:  Body mass index is 27.42 kg/m.  -   clearly complicating his diabetes care.   he is  a candidate for weight loss. I discussed with him the fact that loss of 5 - 10% of his  current body weight will have the most impact on his diabetes management.  The above detailed  ACLM recommendations for nutrition, exercise, sleep, social life, avoidance of risky substances, the need for restorative sleep   information will also detailed on discharge instructions.  5) Chronic Care/Health Maintenance:  -he  is on ACEI/ARB and Statin medications and  is encouraged to initiate and continue to follow up with Ophthalmology, Dentist,  Podiatrist at least yearly or according to recommendations, and advised to   stay away from smoking. I have recommended yearly flu vaccine and pneumonia vaccine at least every 5 years; moderate intensity exercise for up to 150 minutes weekly; and  sleep for 7- 9 hours a day.  - he is  advised to maintain close follow up with [name], MD for primary care needs, as well as his other providers for optimal and coordinated care.   Thank you for involving me in the care of this pleasant patient.  I spent  61  minutes in the care of the patient today including review of labs from CMP, Lipids, Thyroid Function, Hematology (current and previous including abstractions from other  facilities); face-to-face time discussing  his blood glucose readings/logs, discussing hypoglycemia and hyperglycemia episodes and symptoms, medications doses, his options of short and long term treatment based on the latest standards of care / guidelines;  discussion about incorporating lifestyle medicine;  and documenting the encounter. Risk reduction counseling performed per USPSTF guidelines to reduce  obesity and cardiovascular risk factors.      Please refer to Patient Instructions for Blood Glucose Monitoring and Insulin/Medications Dosing Guide"  in media tab for additional information. Please  also refer to " Patient Self Inventory" in the Media  tab for reviewed elements of pertinent patient history.  Grant Bowers participated in the discussions, expressed understanding, and voiced agreement with the above plans.  All questions were answered to his satisfaction. he is encouraged to contact clinic should he have any questions or concerns prior to his return visit.   Follow up plan: - Return in about 4 months (around 05/04/2023) for F/U with Pre-visit Labs, Meter/CGM/Logs, A1c here.  Marquis Lunch, MD [facility] [address] [place], [place] [address] Phone: [phone]  Fax: [phone]    01/02/2023, 5:35 PM  This note was partially dictated with voice recognition software. Similar sounding words can be transcribed inadequately or may not  be corrected upon review.

## 2023-01-04 ENCOUNTER — Ambulatory Visit: Payer: Self-pay | Admitting: "Endocrinology

## 2023-01-09 ENCOUNTER — Ambulatory Visit (INDEPENDENT_AMBULATORY_CARE_PROVIDER_SITE_OTHER): Payer: Medicare HMO

## 2023-01-09 DIAGNOSIS — I441 Atrioventricular block, second degree: Secondary | ICD-10-CM

## 2023-01-15 LAB — CUP PACEART REMOTE DEVICE CHECK
Battery Remaining Longevity: 88 mo
Battery Remaining Percentage: 75 %
Battery Voltage: 2.99 V
Brady Statistic AP VP Percent: 32 %
Brady Statistic AP VS Percent: 1 %
Brady Statistic AS VP Percent: 64 %
Brady Statistic AS VS Percent: 2.7 %
Brady Statistic RA Percent Paced: 31 %
Brady Statistic RV Percent Paced: 97 %
Date Time Interrogation Session: 20241001020023
Implantable Lead Connection Status: 753985
Implantable Lead Connection Status: 753985
Implantable Lead Implant Date: 20220401
Implantable Lead Implant Date: 20220401
Implantable Lead Location: 753859
Implantable Lead Location: 753860
Implantable Lead Model: 3830
Implantable Pulse Generator Implant Date: 20220401
Lead Channel Impedance Value: 480 Ohm
Lead Channel Impedance Value: 510 Ohm
Lead Channel Pacing Threshold Amplitude: 0.75 V
Lead Channel Pacing Threshold Amplitude: 1.125 V
Lead Channel Pacing Threshold Pulse Width: 0.4 ms
Lead Channel Pacing Threshold Pulse Width: 0.4 ms
Lead Channel Sensing Intrinsic Amplitude: 3 mV
Lead Channel Sensing Intrinsic Amplitude: 5.5 mV
Lead Channel Setting Pacing Amplitude: 1.375
Lead Channel Setting Pacing Amplitude: 2 V
Lead Channel Setting Pacing Pulse Width: 0.4 ms
Lead Channel Setting Sensing Sensitivity: 2 mV
Pulse Gen Model: 2272
Pulse Gen Serial Number: 3912376

## 2023-01-23 NOTE — Progress Notes (Signed)
Remote pacemaker transmission.   

## 2023-02-11 ENCOUNTER — Other Ambulatory Visit: Payer: Self-pay | Admitting: Internal Medicine

## 2023-02-13 ENCOUNTER — Telehealth: Payer: Self-pay | Admitting: Internal Medicine

## 2023-02-13 NOTE — Telephone Encounter (Signed)
Refills not due until January 2025.

## 2023-02-13 NOTE — Telephone Encounter (Signed)
Patient requested refills of amLODipine (NORVASC) 10 MG tablet, hydrochlorothiazide (MICROZIDE) 12.5 MG capsule, metFORMIN (GLUCOPHAGE-XR) 500 MG 24 hr tablet, omeprazole (PRILOSEC) 10 MG capsule, and rosuvastatin (CRESTOR) 20 MG tablet. They were all denied due to being requested too soon, but the patient's pharmacy is telling him he does not have any refills. Patient would like to know what to do. Best callback is [phone].

## 2023-02-27 DIAGNOSIS — D2272 Melanocytic nevi of left lower limb, including hip: Secondary | ICD-10-CM | POA: Diagnosis not present

## 2023-02-27 DIAGNOSIS — L814 Other melanin hyperpigmentation: Secondary | ICD-10-CM | POA: Diagnosis not present

## 2023-02-27 DIAGNOSIS — L718 Other rosacea: Secondary | ICD-10-CM | POA: Diagnosis not present

## 2023-02-27 DIAGNOSIS — Z1283 Encounter for screening for malignant neoplasm of skin: Secondary | ICD-10-CM | POA: Diagnosis not present

## 2023-02-27 DIAGNOSIS — D2271 Melanocytic nevi of right lower limb, including hip: Secondary | ICD-10-CM | POA: Diagnosis not present

## 2023-02-27 DIAGNOSIS — L821 Other seborrheic keratosis: Secondary | ICD-10-CM | POA: Diagnosis not present

## 2023-02-27 DIAGNOSIS — D225 Melanocytic nevi of trunk: Secondary | ICD-10-CM | POA: Diagnosis not present

## 2023-03-10 ENCOUNTER — Other Ambulatory Visit: Payer: Self-pay | Admitting: Internal Medicine

## 2023-03-13 ENCOUNTER — Telehealth: Payer: Self-pay | Admitting: Internal Medicine

## 2023-03-13 ENCOUNTER — Other Ambulatory Visit: Payer: Self-pay | Admitting: Internal Medicine

## 2023-03-13 NOTE — Telephone Encounter (Signed)
Medication problems

## 2023-03-13 NOTE — Telephone Encounter (Signed)
Pt states [facility] will be sending [place] a fax about his medication omeprazole (PRILOSEC) 10 MG capsule [[id number]] Pt states that they told him that his prescription was DECLINED. Pt states that he's been taking this medication for years and wants to know what is the issue and how can he get his prescription delivered from [facility] "like he's been doing".  Please advise, Thanks

## 2023-03-14 NOTE — Telephone Encounter (Signed)
Refill too soon, not due until January.

## 2023-04-05 ENCOUNTER — Telehealth: Payer: Self-pay | Admitting: Cardiovascular Disease

## 2023-04-05 NOTE — Telephone Encounter (Signed)
Received message from [facility] Chart - My pacemaker does not feel strong  Patient states that he feels fatigue for 2 weeks ago.

## 2023-04-06 ENCOUNTER — Telehealth: Payer: Self-pay

## 2023-04-06 DIAGNOSIS — E559 Vitamin D deficiency, unspecified: Secondary | ICD-10-CM

## 2023-04-06 DIAGNOSIS — E538 Deficiency of other specified B group vitamins: Secondary | ICD-10-CM

## 2023-04-06 DIAGNOSIS — Z125 Encounter for screening for malignant neoplasm of prostate: Secondary | ICD-10-CM

## 2023-04-06 DIAGNOSIS — E1165 Type 2 diabetes mellitus with hyperglycemia: Secondary | ICD-10-CM

## 2023-04-06 DIAGNOSIS — E7800 Pure hypercholesterolemia, unspecified: Secondary | ICD-10-CM

## 2023-04-06 DIAGNOSIS — I10 Essential (primary) hypertension: Secondary | ICD-10-CM

## 2023-04-06 NOTE — Addendum Note (Signed)
Addended by: [name] on: 04/06/2023 05:00 PM   Modules accepted: Orders

## 2023-04-06 NOTE — Telephone Encounter (Signed)
Copied from CRM [id number]. Topic: General - Other >> Apr 06, 2023  3:58 PM Irine Seal wrote:  Reason for CRM: PT is needing to know if they should fast before blood work, PT has another appt with cardio 04/17/23 at 11:00 he wants to know if his appt time and lab will be done in time for him to make that appt. Callback: [phone]

## 2023-04-06 NOTE — Telephone Encounter (Signed)
Ok to ask pt to have labs done at the lab prior to his 10 am appt, so that he can then go directly to cardiology after  [facility] for fasting

## 2023-04-06 NOTE — Telephone Encounter (Signed)
Returned call to Pt.  Attempted to get manual transmission from Pt but unable to do so.  Appears Pt may  need a cell phone adapter for his monitor to work.  (Will get this from [employer]).  Pt complains of feeling "extremely tired".  States he has no energy.  He has checked his BP and heart rate which have been WNL.  Pt V paces 97%.  Of note-Pt's RV lead was placed in the LBA.    Last EKG 10/12/2020.  Last ECHO 07/29/2020.  Follow up scheduled for in person evaluation.  Pt indicates understanding and thanked nurse for call back.

## 2023-04-09 ENCOUNTER — Other Ambulatory Visit: Payer: Self-pay

## 2023-04-09 ENCOUNTER — Other Ambulatory Visit: Payer: Self-pay | Admitting: Internal Medicine

## 2023-04-09 LAB — CUP PACEART REMOTE DEVICE CHECK
Battery Remaining Longevity: 86 mo
Battery Remaining Percentage: 73 %
Battery Voltage: 3.01 V
Brady Statistic AP VP Percent: 29 %
Brady Statistic AP VS Percent: 1 %
Brady Statistic AS VP Percent: 67 %
Brady Statistic AS VS Percent: 3.5 %
Brady Statistic RA Percent Paced: 27 %
Brady Statistic RV Percent Paced: 96 %
Date Time Interrogation Session: 20241227164325
Implantable Lead Connection Status: 753985
Implantable Lead Connection Status: 753985
Implantable Lead Implant Date: 20220401
Implantable Lead Implant Date: 20220401
Implantable Lead Location: 753859
Implantable Lead Location: 753860
Implantable Lead Model: 3830
Implantable Pulse Generator Implant Date: 20220401
Lead Channel Impedance Value: 480 Ohm
Lead Channel Impedance Value: 510 Ohm
Lead Channel Pacing Threshold Amplitude: 0.75 V
Lead Channel Pacing Threshold Amplitude: 1 V
Lead Channel Pacing Threshold Pulse Width: 0.4 ms
Lead Channel Pacing Threshold Pulse Width: 0.4 ms
Lead Channel Sensing Intrinsic Amplitude: 2.1 mV
Lead Channel Sensing Intrinsic Amplitude: 6.4 mV
Lead Channel Setting Pacing Amplitude: 1.25 V
Lead Channel Setting Pacing Amplitude: 2 V
Lead Channel Setting Pacing Pulse Width: 0.4 ms
Lead Channel Setting Sensing Sensitivity: 2 mV
Pulse Gen Model: 2272
Pulse Gen Serial Number: 3912376

## 2023-04-09 NOTE — Telephone Encounter (Signed)
Called the number provided twice seems that the service has been turned off.

## 2023-04-09 NOTE — Telephone Encounter (Signed)
Called cell number and left voicemail.

## 2023-04-10 ENCOUNTER — Ambulatory Visit (INDEPENDENT_AMBULATORY_CARE_PROVIDER_SITE_OTHER): Payer: Medicare PPO

## 2023-04-10 DIAGNOSIS — I441 Atrioventricular block, second degree: Secondary | ICD-10-CM

## 2023-04-13 DIAGNOSIS — H35033 Hypertensive retinopathy, bilateral: Secondary | ICD-10-CM | POA: Diagnosis not present

## 2023-04-13 DIAGNOSIS — H25813 Combined forms of age-related cataract, bilateral: Secondary | ICD-10-CM | POA: Diagnosis not present

## 2023-04-13 DIAGNOSIS — E119 Type 2 diabetes mellitus without complications: Secondary | ICD-10-CM | POA: Diagnosis not present

## 2023-04-13 DIAGNOSIS — H43813 Vitreous degeneration, bilateral: Secondary | ICD-10-CM | POA: Diagnosis not present

## 2023-04-13 DIAGNOSIS — H40013 Open angle with borderline findings, low risk, bilateral: Secondary | ICD-10-CM | POA: Diagnosis not present

## 2023-04-16 ENCOUNTER — Other Ambulatory Visit: Payer: Self-pay | Admitting: Internal Medicine

## 2023-04-16 NOTE — Telephone Encounter (Signed)
 Copied from CRM [id number]. Topic: Clinical - Medication Refill >> Apr 16, 2023  3:40 PM Mercedes MATSU wrote: Most Recent Primary Care Visit:  Provider: NORLEEN LYNWOOD ORN  Department: [facility]  Visit Type: OFFICE VISIT  Date: 04/25/2022  Medication: metFORMIN  (GLUCOPHAGE -XR) 500 MG 24 hr tablet  Has the patient contacted their pharmacy? Yes (Agent: If no, request that the patient contact the pharmacy for the refill. If patient does not wish to contact the pharmacy document the reason why and proceed with request.) (Agent: If yes, when and what did the pharmacy advise?)  Is this the correct pharmacy for this prescription? Yes If no, delete pharmacy and type the correct one.  This is the patient's preferred pharmacy:  [facility] - [place], [place] - [address] [place] [address] Phone: [phone] Fax: [phone]  [facility] - [place], [place] - [address] [place] [address] Phone: [phone] Fax: [phone]  [facility] (Now [facility]) - [address] [place], [place] - [address] [place] [address] Phone: [phone] Fax: [phone]   Has the prescription been filled recently? Yes  Is the patient out of the medication? Yes  Has the patient been seen for an appointment in the last year OR does the patient have an upcoming appointment? Yes  Can we respond through MyChart? Yes  Agent: Please be advised that Rx refills may take up to 3 business days. We ask that you follow-up with your pharmacy.

## 2023-04-16 NOTE — Progress Notes (Signed)
  Electrophysiology Office Note:   ID:  Grant Bowers, DOB [birth year], MRN [id number]  Primary Cardiologist: None Electrophysiologist: Eulas FORBES Furbish, MD      History of Present Illness:   Grant Bowers is a 78 y.o. male with h/o symptomatic trifascicular block and HTN seen today for acute visit due to increased fatigue.    Patient reports 3 weeks of profound fatigue to the point of sleeping most of the day. Began with fevers, chills, coughs. COVID negative per home test x 1.  Has resulted in persistent fatigue and elevated resting HRs. No edema, syncope, or chest pain. Was told by an ophthalmologist that he potentially has herpetic changes in his eye. Pending further testing.  Pt had been worried his PPM was malfunctioning with elevated resting HRs ~ 100.   Review of systems complete and found to be negative unless listed in HPI.   EP Information / Studies Reviewed:    EKG is ordered today. Personal review as below.  EKG Interpretation Date/Time:  Tuesday April 17 2023 11:26:53 EST Ventricular Rate:  100 PR Interval:  228 QRS Duration:  134 QT Interval:  384 QTC Calculation: 495 R Axis:   -56  Text Interpretation: Sinus rhythm with 1st degree A-V block with Fusion complexes and Premature atrial complexes with Abberant conduction Left axis deviation Left bundle branch block When compared with ECG of 17-Jun-2020 16:36, PREVIOUS ECG IS PRESENT Confirmed by Lesia Sharper [phone]) on 04/17/2023 11:31:55 AM    PPM Interrogation-  reviewed in detail today,  See PACEART report.  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  Echo 07/2020 LVEF 65-70%, grade 1 DD, mod LAE, normal RV, mild MR  Physical Exam:   VS:  BP 110/66   Pulse 100   Ht 6' (1.829 m)   Wt 199 lb 3.2 oz (90.4 kg)   SpO2 99%   BMI 27.02 kg/m    Wt Readings from Last 3 Encounters:  04/17/23 199 lb 3.2 oz (90.4 kg)  04/17/23 199 lb (90.3 kg)  01/02/23 202 lb 3.2 oz (91.7 kg)     GEN: No  acute distress  NECK: No JVD; No carotid bruits CARDIAC: Tachy, but regular rate and rhythm, no murmurs, rubs, gallops RESPIRATORY:  Clear to auscultation without rales, wheezing or rhonchi  ABDOMEN: Soft, non-tender, non-distended EXTREMITIES:  No edema; No deformity   ASSESSMENT AND PLAN:    CHB s/p Abbott PPM  Normal PPM function See Pace Art report No changes today Chronically RV paces, but with left bundle position and QRS of 134 ms not likely contributing  HTN Stable on current regimen   Fatigue By history sounds more like post viral syndrome.  Denies chest pain or pericarditic pain.  With persistent tachycardia will update Echo for completeness.  Large lab panel per PCP pending today  Disposition:   Follow up with Dr. Furbish as scheduled in 3 months as place holder with acute issues.  If symptoms resolve and Echo normal, can adjust back to annual follow up.   Signed, Sharper Prentice Lesia, PA-C

## 2023-04-17 ENCOUNTER — Encounter: Payer: Self-pay | Admitting: Internal Medicine

## 2023-04-17 ENCOUNTER — Encounter: Payer: Self-pay | Admitting: Student

## 2023-04-17 ENCOUNTER — Ambulatory Visit: Payer: Medicare HMO | Attending: Student | Admitting: Student

## 2023-04-17 ENCOUNTER — Ambulatory Visit (INDEPENDENT_AMBULATORY_CARE_PROVIDER_SITE_OTHER): Payer: Medicare HMO | Admitting: Internal Medicine

## 2023-04-17 ENCOUNTER — Telehealth: Payer: Self-pay

## 2023-04-17 ENCOUNTER — Other Ambulatory Visit: Payer: Self-pay

## 2023-04-17 VITALS — BP 110/66 | HR 100 | Ht 72.0 in | Wt 199.2 lb

## 2023-04-17 VITALS — BP 122/70 | HR 104 | Temp 98.4°F | Ht 72.0 in | Wt 199.0 lb

## 2023-04-17 DIAGNOSIS — R5383 Other fatigue: Secondary | ICD-10-CM | POA: Diagnosis not present

## 2023-04-17 DIAGNOSIS — Z0000 Encounter for general adult medical examination without abnormal findings: Secondary | ICD-10-CM | POA: Diagnosis not present

## 2023-04-17 DIAGNOSIS — I10 Essential (primary) hypertension: Secondary | ICD-10-CM

## 2023-04-17 DIAGNOSIS — E1165 Type 2 diabetes mellitus with hyperglycemia: Secondary | ICD-10-CM

## 2023-04-17 DIAGNOSIS — R0600 Dyspnea, unspecified: Secondary | ICD-10-CM

## 2023-04-17 DIAGNOSIS — Z8619 Personal history of other infectious and parasitic diseases: Secondary | ICD-10-CM

## 2023-04-17 DIAGNOSIS — E291 Testicular hypofunction: Secondary | ICD-10-CM

## 2023-04-17 DIAGNOSIS — Z950 Presence of cardiac pacemaker: Secondary | ICD-10-CM

## 2023-04-17 DIAGNOSIS — E538 Deficiency of other specified B group vitamins: Secondary | ICD-10-CM | POA: Diagnosis not present

## 2023-04-17 DIAGNOSIS — H5462 Unqualified visual loss, left eye, normal vision right eye: Secondary | ICD-10-CM

## 2023-04-17 DIAGNOSIS — Z125 Encounter for screening for malignant neoplasm of prostate: Secondary | ICD-10-CM

## 2023-04-17 DIAGNOSIS — Z0001 Encounter for general adult medical examination with abnormal findings: Secondary | ICD-10-CM

## 2023-04-17 DIAGNOSIS — I441 Atrioventricular block, second degree: Secondary | ICD-10-CM

## 2023-04-17 DIAGNOSIS — E559 Vitamin D deficiency, unspecified: Secondary | ICD-10-CM

## 2023-04-17 DIAGNOSIS — R001 Bradycardia, unspecified: Secondary | ICD-10-CM

## 2023-04-17 DIAGNOSIS — E7800 Pure hypercholesterolemia, unspecified: Secondary | ICD-10-CM | POA: Diagnosis not present

## 2023-04-17 DIAGNOSIS — Z7984 Long term (current) use of oral hypoglycemic drugs: Secondary | ICD-10-CM

## 2023-04-17 LAB — URINALYSIS, ROUTINE W REFLEX MICROSCOPIC
Hgb urine dipstick: NEGATIVE
Leukocytes,Ua: NEGATIVE
Nitrite: NEGATIVE
Specific Gravity, Urine: 1.02 (ref 1.000–1.030)
Total Protein, Urine: 100 — AB
Urine Glucose: NEGATIVE
Urobilinogen, UA: 1 (ref 0.0–1.0)
pH: 6 (ref 5.0–8.0)

## 2023-04-17 LAB — CUP PACEART INCLINIC DEVICE CHECK
Battery Remaining Longevity: 86 mo
Battery Voltage: 2.99 V
Brady Statistic RA Percent Paced: 26 %
Brady Statistic RV Percent Paced: 95 %
Date Time Interrogation Session: 20250107130843
Implantable Lead Connection Status: 753985
Implantable Lead Connection Status: 753985
Implantable Lead Implant Date: 20220401
Implantable Lead Implant Date: 20220401
Implantable Lead Location: 753859
Implantable Lead Location: 753860
Implantable Lead Model: 3830
Implantable Pulse Generator Implant Date: 20220401
Lead Channel Impedance Value: 487.5 Ohm
Lead Channel Impedance Value: 512.5 Ohm
Lead Channel Pacing Threshold Amplitude: 0.5 V
Lead Channel Pacing Threshold Amplitude: 0.5 V
Lead Channel Pacing Threshold Amplitude: 1.25 V
Lead Channel Pacing Threshold Pulse Width: 0.4 ms
Lead Channel Pacing Threshold Pulse Width: 0.4 ms
Lead Channel Pacing Threshold Pulse Width: 0.4 ms
Lead Channel Sensing Intrinsic Amplitude: 5 mV
Lead Channel Sensing Intrinsic Amplitude: 5.8 mV
Lead Channel Setting Pacing Amplitude: 1.5 V
Lead Channel Setting Pacing Amplitude: 2 V
Lead Channel Setting Pacing Pulse Width: 0.4 ms
Lead Channel Setting Sensing Sensitivity: 2 mV
Pulse Gen Model: 2272
Pulse Gen Serial Number: 3912376

## 2023-04-17 LAB — CBC WITH DIFFERENTIAL/PLATELET
Basophils Absolute: 0 10*3/uL (ref 0.0–0.1)
Basophils Relative: 0.2 % (ref 0.0–3.0)
Eosinophils Absolute: 0 10*3/uL (ref 0.0–0.7)
Eosinophils Relative: 0.3 % (ref 0.0–5.0)
HCT: 34.7 % — ABNORMAL LOW (ref 39.0–52.0)
Hemoglobin: 12.1 g/dL — ABNORMAL LOW (ref 13.0–17.0)
Lymphocytes Relative: 9.8 % — ABNORMAL LOW (ref 12.0–46.0)
Lymphs Abs: 0.8 10*3/uL (ref 0.7–4.0)
MCHC: 35 g/dL (ref 30.0–36.0)
MCV: 85.7 fL (ref 78.0–100.0)
Monocytes Absolute: 0.7 10*3/uL (ref 0.1–1.0)
Monocytes Relative: 8 % (ref 3.0–12.0)
Neutro Abs: 6.8 10*3/uL (ref 1.4–7.7)
Neutrophils Relative %: 81.7 % — ABNORMAL HIGH (ref 43.0–77.0)
Platelets: 300 10*3/uL (ref 150.0–400.0)
RBC: 4.04 Mil/uL — ABNORMAL LOW (ref 4.22–5.81)
RDW: 13.4 % (ref 11.5–15.5)
WBC: 8.4 10*3/uL (ref 4.0–10.5)

## 2023-04-17 LAB — LIPID PANEL
Cholesterol: 94 mg/dL (ref 0–200)
HDL: 22.3 mg/dL — ABNORMAL LOW (ref >39.00)
LDL Cholesterol: 47 mg/dL (ref 0–99)
NonHDL: 72.19
Total CHOL/HDL Ratio: 4
Triglycerides: 125 mg/dL (ref 0.0–149.0)
VLDL: 25 mg/dL (ref 0.0–40.0)

## 2023-04-17 LAB — BASIC METABOLIC PANEL
BUN: 21 mg/dL (ref 6–23)
CO2: 25 meq/L (ref 19–32)
Calcium: 9.2 mg/dL (ref 8.4–10.5)
Chloride: 102 meq/L (ref 96–112)
Creatinine, Ser: 1.24 mg/dL (ref 0.40–1.50)
GFR: 55.79 mL/min — ABNORMAL LOW (ref >60.00)
Glucose, Bld: 136 mg/dL — ABNORMAL HIGH (ref 70–99)
Potassium: 4.2 meq/L (ref 3.5–5.1)
Sodium: 138 meq/L (ref 135–145)

## 2023-04-17 LAB — HEPATIC FUNCTION PANEL
ALT: 51 U/L (ref 0–53)
AST: 44 U/L — ABNORMAL HIGH (ref 0–37)
Albumin: 3.8 g/dL (ref 3.5–5.2)
Alkaline Phosphatase: 77 U/L (ref 39–117)
Bilirubin, Direct: 0.2 mg/dL (ref 0.0–0.3)
Total Bilirubin: 0.7 mg/dL (ref 0.2–1.2)
Total Protein: 6.4 g/dL (ref 6.0–8.3)

## 2023-04-17 LAB — PSA: PSA: 9.4 ng/mL — ABNORMAL HIGH (ref 0.10–4.00)

## 2023-04-17 LAB — VITAMIN B12: Vitamin B-12: 1388 pg/mL — ABNORMAL HIGH (ref 211–911)

## 2023-04-17 LAB — HEMOGLOBIN A1C: Hgb A1c MFr Bld: 6.2 % (ref 4.6–6.5)

## 2023-04-17 LAB — TSH: TSH: 1.22 u[IU]/mL (ref 0.35–5.50)

## 2023-04-17 LAB — VITAMIN D 25 HYDROXY (VIT D DEFICIENCY, FRACTURES): VITD: 64.59 ng/mL (ref 30.00–100.00)

## 2023-04-17 MED ORDER — METFORMIN HCL ER 500 MG PO TB24: ORAL_TABLET | ORAL | 3 refills | Status: DC

## 2023-04-17 NOTE — Assessment & Plan Note (Signed)
 Also for testosterone level

## 2023-04-17 NOTE — Telephone Encounter (Signed)
 Copied from CRM [id number]. Topic: Clinical - Medical Advice >> Apr 17, 2023  1:25 PM Thersia C wrote:  Reason for CRM: Patient called in stating that he had a cardiology appointment and he was suppose to be doing the lab there but they sent to [facility], but it is busy. Patient wanted to know if he can just come back to dr norleen office and do the lab there he is requesting a callback [phone]

## 2023-04-17 NOTE — Assessment & Plan Note (Signed)
 Last vitamin D Lab Results  Component Value Date   VD25OH 70.66 04/25/2022   Stable, cont oral replacement

## 2023-04-17 NOTE — Patient Instructions (Signed)
 Please continue all other medications as before, and refills have been done if requested.  Please have the pharmacy call with any other refills you may need.  Please continue your efforts at being more active, low cholesterol diet, and weight control.  You are otherwise up to date with prevention measures today.  Please keep your appointments with your specialists as you may have planned - cardiology  Please go to the LAB at the blood drawing area for the tests to be done  You will be contacted by phone if any changes need to be made immediately.  Otherwise, you will receive a letter about your results with an explanation, but please check with MyChart first.  Please make an Appointment to return in 6 months, or sooner if needed

## 2023-04-17 NOTE — Assessment & Plan Note (Signed)
 Etiology unclear, will need urgent optho MD referral,  to f/u any worsening symptoms or concerns

## 2023-04-17 NOTE — Patient Instructions (Signed)
 Medication Instructions:  Your physician recommends that you continue on your current medications as directed. Please refer to the Current Medication list given to you today.  *If you need a refill on your cardiac medications before your next appointment, please call your pharmacy*  Lab Work: None ordered If you have labs (blood work) drawn today and your tests are completely normal, you will receive your results only by: MyChart Message (if you have MyChart) OR A paper copy in the mail If you have any lab test that is abnormal or we need to change your treatment, we will call you to review the results.  Testing/Procedures: Your physician has requested that you have an echocardiogram. Echocardiography is a painless test that uses sound waves to create images of your heart. It provides your doctor with information about the size and shape of your heart and how well your heart's chambers and valves are working. This procedure takes approximately one hour. There are no restrictions for this procedure. Please do NOT wear cologne, perfume, aftershave, or lotions (deodorant is allowed). Please arrive 15 minutes prior to your appointment time.  Please note: We ask at that you not bring children with you during ultrasound (echo/ vascular) testing. Due to room size and safety concerns, children are not allowed in the ultrasound rooms during exams. Our front office staff cannot provide observation of children in our lobby area while testing is being conducted. An adult accompanying a patient to their appointment will only be allowed in the ultrasound room at the discretion of the ultrasound technician under special circumstances. We apologize for any inconvenience.    Follow-Up: At [facility], you and your health needs are our priority.  As part of our continuing mission to provide you with exceptional heart care, we have created designated Provider Care Teams.  These Care Teams include your  primary Cardiologist (physician) and Advanced Practice Providers (APPs -  Physician Assistants and Nurse Practitioners) who all work together to provide you with the care you need, when you need it.  Your next appointment:   As scheduled  Provider:   Eulas Furbish, MD

## 2023-04-17 NOTE — Progress Notes (Signed)
 Patient ID: Grant Bowers, male   DOB: 1945-12-22, 78 y.o.   MRN: [id number]         Chief Complaint:: wellness exam and Medical Management of Chronic Issues (Feeling tired and no appetite has been going on for 3 weeks hasn't had any energy and sleeping more then usual )  , left vision loss and cataract, low testosterone , htn, hld, low vit d, dm,        HPI:  Grant Bowers is a 78 y.o. male here for wellness exam; declines shingrix , for optho referral, o/w up to date                        Also seen recently by eye provider non MD with pt c/o 75% left eye vision loss now improved to 25 % per pt, no specific tx but no explanation to pt, here for second opinion.  Does have cataract and will be seen soon for removal by provider in [place].  Also had recent URI about time of vision loss , now with post viral fatigue.  Wife concerned as he is rarely ill in his life and now seems to have this lingering fatigue.     Wt Readings from Last 3 Encounters:  04/17/23 199 lb 3.2 oz (90.4 kg)  04/17/23 199 lb (90.3 kg)  01/02/23 202 lb 3.2 oz (91.7 kg)   BP Readings from Last 3 Encounters:  04/17/23 110/66  04/17/23 122/70  01/02/23 (!) 132/50   Immunization History  Administered Date(s) Administered   Fluad Quad(high Dose 65+) 02/28/2023   Influenza Split 03/13/2011   Influenza Whole 02/11/2008, 01/08/2009, 02/11/2010   Influenza, High Dose Seasonal PF 01/10/2019, 03/31/2022   Influenza-Unspecified 02/01/2018, 03/22/2020, 03/14/2021   Moderna Sars-Covid-2 Vaccination 07/10/2019, 08/09/2019, 10/20/2019, 04/12/2020   Pneumococcal Conjugate-13 09/02/2013   Pneumococcal Polysaccharide-23 09/17/2014   Td 04/10/2000, 01/09/2007   Tdap 10/31/2018   Zoster Recombinant(Shingrix) 03/31/2022   Zoster, Live 04/08/2008   Health Maintenance Due  Topic Date Due   Medicare Annual Wellness (AWV)  03/12/2018   Zoster Vaccines- Shingrix (2 of 2) 05/26/2022   Diabetic kidney evaluation - eGFR measurement   04/26/2023   Diabetic kidney evaluation - Urine ACR  04/26/2023      Past Medical History:  Diagnosis Date   BPH with elevated PSA    urologist--- dr rosalind--  has had negative prostate bx   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   [facility];   followed by [facility] cardiology--- dr fernande   Diabetes mellitus without complication (HCC)    Dyspnea due to COVID-19    with exertion   ED (erectile dysfunction)    GERD (gastroesophageal reflux disease)    History of 2019 novel coronavirus disease (COVID-19) 05/07/2020   per pt mild symptoms all resolved with exception DOE   History of cardiac murmur as a child    History of kidney stones    Hyperlipidemia    Hypertension    followed by pcp and cardiology   Hypogonadism male    Mild atherosclerosis of both carotid arteries 09/23/2015   duplex doppler in epic bilateral ICA 1-39%   OSA on CPAP    per pt uses nightly   Post-COVID chronic dyspnea    had covid 05-07-2020;  per pt never had issue prior to covid ;  PFT 10-19-2020 normal per dr c. young   Right ureteral calculus 02/11/2010   Skin cancer    Calf   Trifascicular  block    alternating with BBB per dr fernande note;  s/p PPM 07-09-2020;   echo 07-29-2020 mild LVH, G1DD, ef 65-70%, moderate LAE, mild MR without stenosis   Past Surgical History:  Procedure Laterality Date   CARDIAC CATHETERIZATION  2009   per pt in [place] , told was normal   CARDIAC CATHETERIZATION  02/17/2010   @[facility]   by dr verlin--- no angiographic evidence cad, normal lvsf,  false positive stress test   COLONOSCOPY  12/07/2006   [facility] [place]    CYSTOSCOPY WITH RETROGRADE PYELOGRAM, URETEROSCOPY AND STENT PLACEMENT Right 10/29/2020   Procedure: CYSTOSCOPY WITH RETROGRADE PYELOGRAM, URETEROSCOPY AND STENT PLACEMENT;  Surgeon: Matilda Senior, MD;  Location: [facility];  Service: Urology;  Laterality: Right;   CYSTOSCOPY WITH RETROGRADE PYELOGRAM, URETEROSCOPY AND STENT PLACEMENT Right 11/09/2020   Procedure:  CYSTOSCOPY WITH RIGHT  RETROGRADE PYELOGRAM, URETEROSCOPY AND STENT EXHANGE;  Surgeon: Rosalind Zachary NOVAK, MD;  Location: [facility];  Service: Urology;  Laterality: Right;  30 MINS   CYSTOSCOPY WITH URETEROSCOPY, STONE BASKETRY AND STENT PLACEMENT  2012   CYSTOSCOPY/URETEROSCOPY/HOLMIUM LASER/STENT PLACEMENT Right 12/20/2021   Procedure: CYSTOSCOPY/URETEROSCOPY/HOLMIUM LASER/STENT PLACEMENT;  Surgeon: Rosalind Zachary NOVAK, MD;  Location: [facility];  Service: Urology;  Laterality: Right;  45 MINS   EXTRACORPOREAL SHOCK WAVE LITHOTRIPSY Right 04/29/2020   Procedure: EXTRACORPOREAL SHOCK WAVE LITHOTRIPSY (ESWL);  Surgeon: Elisabeth Valli BIRCH, MD;  Location: [facility];  Service: Urology;  Laterality: Right;   HOLMIUM LASER APPLICATION Right 11/09/2020   Procedure: HOLMIUM LASER APPLICATION;  Surgeon: Rosalind Zachary NOVAK, MD;  Location: [facility];  Service: Urology;  Laterality: Right;   KNEE ARTHROSCOPY Left 2008   PACEMAKER IMPLANT N/A 07/09/2020   Procedure: PACEMAKER IMPLANT;  Surgeon: Fernande Elspeth BROCKS, MD;  Location: [facility] INV[facility] CV [facility];  Service: Cardiovascular;  Laterality: N/A;    reports that he has never smoked. He has never used smokeless tobacco. He reports that he does not currently use alcohol. He reports that he does not use drugs. family history includes Colon polyps in his brother; Diabetes in his brother and father; Pancreatic cancer in his mother. No Known Allergies Current Outpatient Medications on File Prior to Visit  Medication Sig Dispense Refill   Alcohol Swabs  (B-D SINGLE USE SWABS  REGULAR) PADS Use as directed once per day E11.9 100 each 12   alfuzosin  (UROXATRAL ) 10 MG 24 hr tablet Take 10 mg by mouth at bedtime.     amLODipine  (NORVASC ) 10 MG tablet TAKE 1 TABLET EVERY DAY ANNUAL APPT DUE IN JANUARY MUST SEE PROVIDER FOR FUTURE REFILLS 90 tablet 3   aspirin  EC 81 MG tablet Take 81 mg by mouth daily. Swallow whole.     Blood Glucose  Calibration (TRUE METRIX LEVEL 1) Low SOLN Use as directed once per day E11.9 1 each 1   Blood Glucose Monitoring Suppl (TRUE METRIX METER) DEVI USE AS DIRECTED TO test blood sugar EVERY DAY     cetirizine  (ZYRTEC ) 10 MG tablet TAKE 1 TABLET EVERY DAY 90 tablet 3   Cholecalciferol (VITAMIN D3 SUPER STRENGTH) 50 MCG (2000 UT) TABS Take 2,000 Units by mouth daily.     fluticasone  (FLONASE  ALLERGY RELIEF) 50 MCG/ACT nasal spray Place 2 sprays into both nostrils daily.     glucose blood (TRUE METRIX BLOOD GLUCOSE TEST) test strip TEST BLOOD SUGAR TWO TIMES DAILY AS INSTRUCTED 200 strip 3   hydrochlorothiazide  (MICROZIDE ) 12.5 MG capsule TAKE 1 CAPSULE EVERY DAY 90 capsule  3   irbesartan  (AVAPRO ) 300 MG tablet TAKE 1 TABLET AT BEDTIME. ANNUAL APPT DUE IN [place]. MUST SEE PROVIDER FOR FUTURE REFILLS 90 tablet 3   l-methylfolate-B6-B12 (METANX) 3-35-2 MG TABS tablet Take 1 tablet by mouth daily.     metFORMIN  (GLUCOPHAGE -XR) 500 MG 24 hr tablet TAKE 1 TABLET EVERY DAY WITH BREAKFAST 90 tablet 3   omeprazole  (PRILOSEC) 10 MG capsule TAKE 1 CAPSULE EVERY DAY ANNUAL APPT DUE IN JANUARY MUST SEE PROVIDER FOR FUTURE REFILLS 90 capsule 3   rosuvastatin  (CRESTOR ) 20 MG tablet TAKE 1 TABLET EVERY DAY 90 tablet 3   TRUEplus Lancets 33G MISC Use as directed once per day E11.9 100 each 12   zinc gluconate 50 MG tablet Take 50 mg by mouth daily.     Semaglutide  (RYBELSUS ) 3 MG TABS Take 1 tablet (3 mg total) by mouth daily before breakfast. 90 tablet 1   No current facility-administered medications on file prior to visit.        ROS:  All others reviewed and negative.  Objective        PE:  BP 122/70 (BP Location: Right Arm, Patient Position: Sitting, Cuff Size: Normal)   Pulse (!) 104   Temp 98.4 F (36.9 C) (Oral)   Ht 6' (1.829 m)   Wt 199 lb (90.3 kg)   SpO2 99%   BMI 26.99 kg/m                 Constitutional: Pt appears in NAD               HENT: Head: NCAT.                Right Ear: External  ear normal.                 Left Ear: External ear normal.                Eyes: . Pupils are equal, round, and reactive to light. Conjunctivae and EOM are normal               Nose: without d/c or deformity               Neck: Neck supple. Gross normal ROM               Cardiovascular: Normal rate and regular rhythm.                 Pulmonary/Chest: Effort normal and breath sounds without rales or wheezing.                Abd:  Soft, NT, ND, + BS, no organomegaly               Neurological: Pt is alert. At baseline orientation, motor grossly intact               Skin: Skin is warm. No rashes, no other new lesions, LE edema - none               Psychiatric: Pt behavior is normal without agitation   Micro: none  Cardiac tracings I have personally interpreted today:  none  Pertinent Radiological findings (summarize): none   Lab Results  Component Value Date   WBC 6.4 04/25/2022   HGB 14.1 04/25/2022   HCT 41.1 04/25/2022   PLT 235.0 04/25/2022   GLUCOSE 124 (H) 04/25/2022   CHOL 124 04/25/2022   TRIG 67.0 04/25/2022   HDL 46.40 04/25/2022  LDLDIRECT 150.3 02/11/2010   LDLCALC 65 04/25/2022   ALT 13 04/25/2022   AST 16 04/25/2022   NA 142 04/25/2022   K 4.3 04/25/2022   CL 103 04/25/2022   CREATININE 1.24 04/25/2022   BUN 22 04/25/2022   CO2 27 04/25/2022   TSH 2.16 04/25/2022   PSA 6.17 (H) 04/25/2022   INR 1.2 ratio (H) 02/15/2010   HGBA1C 6.5 01/02/2023   MICROALBUR 41.3 (H) 04/25/2022   Assessment/Plan:  Aarnav Steagall is a 78 y.o. White or Caucasian [1] male with  has a past medical history of BPH with elevated PSA, Cardiac pacemaker in situ (07/09/2020), Diabetes mellitus without complication (HCC), Dyspnea due to COVID-19, ED (erectile dysfunction), GERD (gastroesophageal reflux disease), History of 2019 novel coronavirus disease (COVID-19) (05/07/2020), History of cardiac murmur as a child, History of kidney stones, Hyperlipidemia, Hypertension, Hypogonadism male,  Mild atherosclerosis of both carotid arteries (09/23/2015), OSA on CPAP, Post-COVID chronic dyspnea, Right ureteral calculus (02/11/2010), Skin cancer, and Trifascicular block.  Encounter for well adult exam with abnormal findings Age and sex appropriate education and counseling updated with regular exercise and diet Referrals for preventative services - none needed Immunizations addressed - declines shingrx Smoking counseling  - none needed Evidence for depression or other mood disorder - none significant Most recent labs reviewed. I have personally reviewed and have noted: 1) the patient's medical and social history 2) The patient's current medications and supplements 3) The patient's height, weight, and BMI have been recorded in the chart   Hypogonadism male Also for testosterone  level  Essential hypertension, benign BP Readings from Last 3 Encounters:  04/17/23 110/66  04/17/23 122/70  01/02/23 (!) 132/50   Stable, pt to continue medical treatment norvasc  10 every day, hct 12.5 every day, avapro  300 qd   Hyperlipidemia Lab Results  Component Value Date   LDLCALC 65 04/25/2022   Stable, pt to continue current statin crestor  20 qd   Vitamin D  deficiency Last vitamin D  Lab Results  Component Value Date   VD25OH 70.66 04/25/2022   Stable, cont oral replacement   Diabetes (HCC) Lab Results  Component Value Date   HGBA1C 6.5 01/02/2023   Stable, pt to continue current medical treatment metformin  Er 500 mg every day, rybelsus  3 mg qd   Vision loss, left eye Etiology unclear, will need urgent optho MD referral,  to f/u any worsening symptoms or concerns  Followup: Return in about 6 months (around 10/15/2023).  Lynwood Rush, MD 04/17/2023 9:38 PM [facility]

## 2023-04-17 NOTE — Assessment & Plan Note (Signed)
 Lab Results  Component Value Date   HGBA1C 6.5 01/02/2023   Stable, pt to continue current medical treatment metformin Er 500 mg every day, rybelsus 3 mg qd

## 2023-04-17 NOTE — Assessment & Plan Note (Signed)
 Lab Results  Component Value Date   LDLCALC 65 04/25/2022   Stable, pt to continue current statin crestor 20 qd

## 2023-04-17 NOTE — Assessment & Plan Note (Signed)
 Age and sex appropriate education and counseling updated with regular exercise and diet Referrals for preventative services - none needed Immunizations addressed - declines shingrx Smoking counseling  - none needed Evidence for depression or other mood disorder - none significant Most recent labs reviewed. I have personally reviewed and have noted: 1) the patient's medical and social history 2) The patient's current medications and supplements 3) The patient's height, weight, and BMI have been recorded in the chart

## 2023-04-17 NOTE — Assessment & Plan Note (Signed)
 BP Readings from Last 3 Encounters:  04/17/23 110/66  04/17/23 122/70  01/02/23 (!) 132/50   Stable, pt to continue medical treatment norvasc 10 every day, hct 12.5 every day, avapro 300 qd

## 2023-04-17 NOTE — Telephone Encounter (Signed)
 Ok to hold off on repeating lab for now, as the other lab should be back fairly soon, and the insurance will not likely pay for doing the testing twice,  thanks

## 2023-04-18 ENCOUNTER — Other Ambulatory Visit: Payer: Self-pay | Admitting: Internal Medicine

## 2023-04-18 DIAGNOSIS — R972 Elevated prostate specific antigen [PSA]: Secondary | ICD-10-CM

## 2023-04-18 DIAGNOSIS — N200 Calculus of kidney: Secondary | ICD-10-CM

## 2023-04-18 LAB — MICROALBUMIN / CREATININE URINE RATIO
Creatinine,U: 236 mg/dL
Microalb Creat Ratio: 15.2 mg/g (ref 0.0–30.0)
Microalb, Ur: 36 mg/dL — ABNORMAL HIGH (ref 0.0–1.9)

## 2023-04-18 LAB — HSV 1 ANTIBODY, IGG: HSV 1 Glycoprotein G Ab, IgG: 2.99 {index} — ABNORMAL HIGH

## 2023-04-19 DIAGNOSIS — H4312 Vitreous hemorrhage, left eye: Secondary | ICD-10-CM | POA: Diagnosis not present

## 2023-04-19 DIAGNOSIS — H2513 Age-related nuclear cataract, bilateral: Secondary | ICD-10-CM | POA: Diagnosis not present

## 2023-04-19 DIAGNOSIS — E119 Type 2 diabetes mellitus without complications: Secondary | ICD-10-CM | POA: Diagnosis not present

## 2023-04-19 LAB — HM DIABETES EYE EXAM

## 2023-04-19 NOTE — Telephone Encounter (Signed)
 Called callback and was the wrong number , also number on the chart not in service.

## 2023-04-20 ENCOUNTER — Encounter (INDEPENDENT_AMBULATORY_CARE_PROVIDER_SITE_OTHER): Payer: Self-pay | Admitting: Ophthalmology

## 2023-04-20 ENCOUNTER — Ambulatory Visit (INDEPENDENT_AMBULATORY_CARE_PROVIDER_SITE_OTHER): Payer: Medicare HMO | Admitting: Ophthalmology

## 2023-04-20 DIAGNOSIS — H25813 Combined forms of age-related cataract, bilateral: Secondary | ICD-10-CM

## 2023-04-20 DIAGNOSIS — I10 Essential (primary) hypertension: Secondary | ICD-10-CM | POA: Diagnosis not present

## 2023-04-20 DIAGNOSIS — Z7984 Long term (current) use of oral hypoglycemic drugs: Secondary | ICD-10-CM

## 2023-04-20 DIAGNOSIS — E119 Type 2 diabetes mellitus without complications: Secondary | ICD-10-CM | POA: Diagnosis not present

## 2023-04-20 DIAGNOSIS — H43819 Vitreous degeneration, unspecified eye: Secondary | ICD-10-CM

## 2023-04-20 DIAGNOSIS — H35033 Hypertensive retinopathy, bilateral: Secondary | ICD-10-CM

## 2023-04-20 NOTE — Progress Notes (Signed)
 [facility] - Clinic Note  04/20/2023   CHIEF COMPLAINT Patient presents for Retina Evaluation  HISTORY OF PRESENT ILLNESS: Grant Bowers is a 78 y.o. male who presents to the clinic today for:  HPI     Retina Evaluation   In left eye.  This started 1 day ago.  Duration of 1 day.  Context:  distance vision, mid-range vision and near vision.  I, the attending physician,  performed the HPI with the patient and updated documentation appropriately.        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     One week ago patient states he woke up with a decrease in vision in the left eye. Onset Jan 2  Referring physician: [name], MD [address] [place],  [place] [address]  HISTORICAL INFORMATION:  Selected notes from the MEDICAL RECORD NUMBER Referred by Dr. Hamilton Grant for decreased vision OS LEE:  Ocular Hx- PMH-   CURRENT MEDICATIONS: No current outpatient medications on file. (Ophthalmic Drugs)   No current facility-administered medications for this visit. (Ophthalmic Drugs)   Current Outpatient Medications (Other)  Medication Sig   Alcohol Swabs  (B-D SINGLE USE SWABS  REGULAR) PADS Use as directed once per day E11.9   alfuzosin  (UROXATRAL ) 10 MG 24 hr tablet Take 10 mg by mouth at bedtime.   amLODipine  (NORVASC ) 10 MG tablet TAKE 1 TABLET EVERY DAY ANNUAL APPT DUE IN JANUARY MUST SEE PROVIDER FOR  FUTURE REFILLS   aspirin  EC 81 MG tablet Take 81 mg by mouth daily. Swallow whole.   Blood Glucose Calibration (TRUE METRIX LEVEL 1) Low SOLN Use as directed once per day E11.9   Blood Glucose Monitoring Suppl (TRUE METRIX METER) DEVI USE AS DIRECTED TO test blood sugar EVERY DAY   cetirizine  (ZYRTEC ) 10 MG tablet TAKE 1 TABLET EVERY DAY   Cholecalciferol (VITAMIN D3 SUPER STRENGTH) 50 MCG (2000 UT) TABS Take 2,000 Units by mouth daily.   fluticasone  (FLONASE  ALLERGY RELIEF) 50 MCG/ACT nasal spray Place 2 sprays into both nostrils daily.   glucose blood (TRUE METRIX BLOOD GLUCOSE TEST) test strip TEST BLOOD SUGAR TWO TIMES DAILY AS INSTRUCTED   hydrochlorothiazide  (MICROZIDE ) 12.5 MG capsule TAKE 1 CAPSULE EVERY DAY   irbesartan  (AVAPRO ) 300 MG tablet TAKE 1 TABLET AT BEDTIME. ANNUAL APPT DUE IN [place]. MUST SEE PROVIDER FOR FUTURE REFILLS   l-methylfolate-B6-B12 (METANX) 3-35-2 MG TABS tablet Take 1 tablet by mouth daily.   metFORMIN  (GLUCOPHAGE -XR) 500 MG 24 hr tablet TAKE 1 TABLET EVERY DAY WITH BREAKFAST   omeprazole  (PRILOSEC) 10 MG capsule TAKE 1 CAPSULE EVERY DAY ANNUAL APPT DUE IN JANUARY MUST SEE PROVIDER FOR FUTURE REFILLS   rosuvastatin  (CRESTOR ) 20 MG tablet TAKE 1 TABLET EVERY DAY  Semaglutide  (RYBELSUS ) 3 MG TABS Take 1 tablet (3 mg total) by mouth daily before breakfast.   TRUEplus Lancets 33G MISC Use as directed once per day E11.9   zinc gluconate 50 MG tablet Take 50 mg by mouth daily.   No current facility-administered medications for this visit. (Other)   REVIEW OF SYSTEMS: ROS   Positive for: Gastrointestinal, Endocrine, Cardiovascular, Eyes, Allergic/Imm Last edited by Resa Delon ORN, COT on 04/20/2023  9:21 AM.     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   [facility];   followed by EP  cardiology--- dr fernande   Diabetes mellitus without complication (HCC)    Dyspnea due to COVID-19    with exertion   ED (erectile dysfunction)    GERD (gastroesophageal reflux disease)    History of 2019 novel coronavirus disease (COVID-19) 05/07/2020   per pt mild symptoms all resolved with exception DOE   History of cardiac murmur as a child    History of kidney stones    Hyperlipidemia    Hypertension    followed by pcp and cardiology   Hypogonadism male    Mild atherosclerosis of both carotid arteries 09/23/2015   duplex doppler in epic bilateral ICA 1-39%   OSA on CPAP    per pt uses nightly   Post-COVID chronic dyspnea    had covid 05-07-2020;  per pt never had issue prior to covid ;  PFT 10-19-2020 normal per dr c. 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;   echo 07-29-2020 mild LVH, G1DD, ef 65-70%, moderate LAE, mild MR without stenosis   Past Surgical History:  Procedure Laterality Date   CARDIAC CATHETERIZATION  2009   per pt in [place] , told was normal   CARDIAC CATHETERIZATION  02/17/2010   @[facility]   by dr verlin--- no angiographic evidence cad, normal lvsf,  false positive stress test   COLONOSCOPY  12/07/2006   [facility] in [place]    CYSTOSCOPY WITH RETROGRADE PYELOGRAM, URETEROSCOPY AND STENT PLACEMENT Right 10/29/2020   Procedure: CYSTOSCOPY WITH RETROGRADE PYELOGRAM, URETEROSCOPY AND STENT PLACEMENT;  Surgeon: Matilda Senior, MD;  Location: [facility];  Service: Urology;  Laterality: Right;   CYSTOSCOPY WITH RETROGRADE PYELOGRAM, URETEROSCOPY AND STENT PLACEMENT Right 11/09/2020   Procedure: CYSTOSCOPY WITH RIGHT  RETROGRADE PYELOGRAM, URETEROSCOPY AND STENT EXHANGE;  Surgeon: Grant Zachary NOVAK, MD;  Location[facility];  Service: Urology;  Laterality: Right;  30 MINS   CYSTOSCOPY WITH URETEROSCOPY, STONE BASKETRY AND STENT PLACEMENT  2012    CYSTOSCOPY/URETEROSCOPY/HOLMIUM LASER/STENT PLACEMENT Right 12/20/2021   Procedure: CYSTOSCOPY/URETEROSCOPY/HOLMIUM LASER/STENT PLACEMENT;  Surgeon: Grant Zachary NOVAK, MD;  Location: [facility];  Service: Urology;  Laterality: Right;  45 MINS   EXTRACORPOREAL SHOCK WAVE LITHOTRIPSY Right 04/29/2020   Procedure: EXTRACORPOREAL SHOCK WAVE LITHOTRIPSY (ESWL);  Surgeon: Elisabeth Valli BIRCH, MD;  Location: [facility];  Service: Urology;  Laterality: Right;   HOLMIUM LASER APPLICATION Right 11/09/2020   Procedure: HOLMIUM LASER APPLICATION;  Surgeon: Grant Zachary NOVAK, MD;  Location: [facility];  Service: Urology;  Laterality: Right;   KNEE ARTHROSCOPY Left 2008   PACEMAKER IMPLANT N/A 07/09/2020   Procedure: PACEMAKER  IMPLANT;  Surgeon: Fernande Elspeth BROCKS, MD;  Location: [facility]VASIVE CV LAB;  Service: Cardiovascular;  Laterality: N/A;   FAMILY HISTORY Family History  Problem Relation Age of Onset   Pancreatic cancer Mother    Diabetes Father    Diabetes Brother    Colon polyps Brother    Colon cancer Neg Hx    Esophageal cancer Neg Hx    Rectal cancer Neg Hx    Stomach cancer Neg Hx    SOCIAL HISTORY Social History   Tobacco Use   Smoking status: Never   Smokeless tobacco: Never  Vaping Use   Vaping status: Never Used  Substance Use Topics   Alcohol use: Not Currently    Comment: very rare    Drug use: Never       OPHTHALMIC EXAM:  Base Eye Exam     Visual Acuity (Snellen - Linear)       Right Left   Dist [place] 20/20 -3 20/30   Dist ph [place]  NI         Tonometry (Tonopen, 9:29 AM)       Right Left   Pressure 11 12         Pupils       Pupils Dark Light Shape React APD   Right PERRL 3 2 Round Brisk None   Left PERRL 3 2 Round Brisk None         Visual Fields       Left Right    Full Full         Extraocular Movement       Right Left    Full, Ortho Full, Ortho         Neuro/Psych     Oriented x3: Yes   Mood/Affect: Normal          Dilation     Both eyes: 2.5% Phenylephrine  @ 9:29 AM           Slit Lamp and Fundus Exam     External Exam       Right Left   External Normal Normal         Slit Lamp Exam       Right Left   Lids/Lashes Dermatochalasis - upper lid Dermatochalasis - upper lid   Conjunctiva/Sclera White and quiet White and quiet   Cornea Trace Punctate epithelial erosions, Debris in tear film 1+ fine Punctate epithelial erosions   Anterior Chamber Deep and clear Deep and clear   Iris Round and moderately dilated Round and dilated   Lens 3+Nuclear sclerosis with brunescence, 3+ Cortical cataract 3+Nuclear sclerosis with brunescence, 3+ Cortical cataract   Anterior Vitreous Vitreous opascities Vitreous syneresis, Mild RBC, Posterior vitreous detachment         Fundus Exam       Right Left   Disc Pink and sharp Pink and sharp   C/D Ratio 0.6 0.6   Macula Flat, Blunted foveal reflex, RPE mottling, No heme or edema Flat, Blunted foveal reflex, RPE mottling, No heme or edema   Vessels Vascular attenuation, Tortuous Vascular attenuation, Tortuous   Periphery Attached, mild pigmented cystoid degeneration temporal periphery Very hazy view inferiorly, focal hypopigmented CR atrophy 0130 equator-- no break on depression, No RT/RD on 360 scleral depression           Refraction     Manifest Refraction   Unable to correct OS any better           IMAGING AND PROCEDURES  Imaging and Procedures for 04/20/2023  OCT, Retina - OU - Both Eyes       Right Eye Quality was good. Central Foveal Thickness: 265. Progression has no prior data. Findings include normal foveal contour, no IRF, no SRF (Partial PVD).   Left Eye Quality was good. Central Foveal Thickness: 271. Progression has no prior data. Findings include normal foveal contour, no IRF, no SRF (Vitreous opacities ).   Notes *Images captured and stored on drive  Diagnosis / Impression:  NFP; no IRF/SRF OU OS w/  vitreous opacities  Clinical management:  See below  Abbreviations: NFP - Normal foveal profile. CME - cystoid macular edema. PED - pigment epithelial detachment. IRF - intraretinal fluid. SRF - subretinal fluid. EZ - ellipsoid zone. ERM - epiretinal membrane. ORA - outer retinal atrophy. ORT - outer retinal tubulation. SRHM - subretinal hyper-reflective material. IRHM - intraretinal hyper-reflective material           ASSESSMENT/PLAN:   ICD-10-CM   1. Acute hemorrhagic posterior vitreous detachment  H43.819 OCT, Retina - OU - Both Eyes    2. Diabetes mellitus type 2 without retinopathy (HCC)  E11.9     3. Diabetes mellitus treated with oral medication (HCC)  E11.9    Z79.84     4. Hypertensive retinopathy of both eyes  H35.033     5. Essential hypertension  I10     6. Combined forms of age-related cataract of both eyes  H25.813      Hemorrhagic PVD OS  - Onset Apr 12, 2023 upon waking -- presented to Dr. Octavia on 1.9.25; initially evaluated at [facility]  - pt reports gradual improvement in vision and floaters since onset  - denies photopsias  - focal hypopigmented CR atrophy 0130 equator-- no break on depression  - Discussed findings and prognosis  - No RT or RD on 360 scleral depressed exam  - Reviewed s/s of RT/RD  - Strict return precautions for any such RT/RD signs/symptoms - VH precautions reviewed -- minimize activities, keep head elevated, avoid ASA/NSAIDs/blood thinners as able  - F/U 4 weeks, sooner prn - DFE, OCT  2,3. Diabetes mellitus, type 2 without retinopathy  - A1C 6.2 - The incidence, risk factors for progression, natural history and treatment options for diabetic retinopathy  were discussed with patient.   - The need for close monitoring of blood glucose, blood pressure, and serum lipids, avoiding cigarette or any type of tobacco, and the need for long term follow up was also discussed with patient. - f/u in 1 year, sooner prn   4,5. Hypertensive  retinopathy OU  - 160/65 - highest in the past month - discussed importance of tight BP control - monitor   6. Mixed Cataract OU - The symptoms of cataract, surgical options, and treatments and risks were discussed with patient. - discussed diagnosis and progression - under the expert care of [facility]  Ophthalmic Meds Ordered this visit:  No orders of the defined types were placed in this encounter.    Return in about 4 weeks (around 05/18/2023) for f/u Hemorrhagic PVD OS , DFE, OCT.  There are no Patient Instructions on file for this visit.  Explained the diagnoses, plan, and follow up with the patient and they expressed understanding.  Patient expressed understanding of the importance of proper follow up care.   This document serves as a record of services personally performed by Redell JUDITHANN Hans, MD, PhD. It was created on their behalf  by Wanda GEANNIE Keens, COT an ophthalmic technician. The creation of this record is the provider's dictation and/or activities during the visit.    Electronically signed by:  Wanda GEANNIE Keens, COT  04/20/23 1:32 PM  Redell JUDITHANN Hans, M.D., Ph.D. Diseases & Surgery of the Retina and Vitreous [facility] 04/20/2023  I have reviewed the above documentation for accuracy and completeness, and I agree with the above. Redell JUDITHANN Hans, M.D., Ph.D. 04/20/23 1:32 PM   Abbreviations: M myopia (nearsighted); A astigmatism; H hyperopia (farsighted); P presbyopia; Mrx spectacle prescription;  CTL contact lenses; OD right eye; OS left eye; OU both eyes  XT exotropia; ET esotropia; PEK punctate epithelial keratitis; PEE punctate epithelial erosions; DES dry eye syndrome; MGD meibomian gland dysfunction; ATs artificial tears; PFAT's preservative free artificial tears; NSC nuclear sclerotic cataract; PSC posterior subcapsular cataract; ERM epi-retinal membrane; PVD posterior vitreous detachment; RD retinal detachment; DM diabetes mellitus;  DR diabetic retinopathy; NPDR non-proliferative diabetic retinopathy; PDR proliferative diabetic retinopathy; CSME clinically significant macular edema; DME diabetic macular edema; dbh dot blot hemorrhages; CWS cotton wool spot; POAG primary open angle glaucoma; C/D cup-to-disc ratio; HVF humphrey visual field; GVF goldmann visual field; OCT optical coherence tomography; IOP intraocular pressure; BRVO Branch retinal vein occlusion; CRVO central retinal vein occlusion; CRAO central retinal artery occlusion; BRAO branch retinal artery occlusion; RT retinal tear; SB scleral buckle; PPV pars plana vitrectomy; VH Vitreous hemorrhage; PRP panretinal laser photocoagulation; IVK intravitreal kenalog; VMT vitreomacular traction; MH Macular hole;  NVD neovascularization of the disc; NVE neovascularization elsewhere; AREDS age related eye disease study; ARMD age related macular degeneration; POAG primary open angle glaucoma; EBMD epithelial/anterior basement membrane dystrophy; ACIOL anterior chamber intraocular lens; IOL intraocular lens; PCIOL posterior chamber intraocular lens; Phaco/IOL phacoemulsification with intraocular lens placement; PRK photorefractive keratectomy; LASIK laser assisted in situ keratomileusis; HTN hypertension; DM diabetes mellitus; COPD chronic obstructive pulmonary disease

## 2023-04-25 ENCOUNTER — Telehealth: Payer: Self-pay | Admitting: Cardiovascular Disease

## 2023-04-25 ENCOUNTER — Ambulatory Visit: Payer: Medicare HMO | Attending: Student

## 2023-04-25 DIAGNOSIS — R5383 Other fatigue: Secondary | ICD-10-CM | POA: Diagnosis not present

## 2023-04-25 DIAGNOSIS — R0600 Dyspnea, unspecified: Secondary | ICD-10-CM

## 2023-04-25 LAB — ECHOCARDIOGRAM COMPLETE
AR max vel: 3.45 cm2
AV Area VTI: 3.55 cm2
AV Area mean vel: 3.36 cm2
AV Mean grad: 3 mm[Hg]
AV Peak grad: 5.6 mm[Hg]
Ao pk vel: 1.18 m/s
Area-P 1/2: 4.54 cm2
Calc EF: 53.3 %
MV VTI: 2.62 cm2
S' Lateral: 2.8 cm
Single Plane A2C EF: 59.4 %
Single Plane A4C EF: 50.9 %

## 2023-04-25 NOTE — Telephone Encounter (Signed)
 Pt in office today for an echo and he is wanting to know if Dr. Arlester Ladd could order him a Corotid US . He would like it done here at the [facility] office [phone] and [phone] (cell) are the best numbers to reach the pt.

## 2023-04-26 ENCOUNTER — Emergency Department (HOSPITAL_COMMUNITY)
Admission: EM | Admit: 2023-04-26 | Discharge: 2023-04-26 | Disposition: A | Discharge status: Home / Self Care | Payer: Medicare HMO | Attending: Emergency Medicine | Admitting: Emergency Medicine

## 2023-04-26 ENCOUNTER — Other Ambulatory Visit: Payer: Self-pay | Admitting: Internal Medicine

## 2023-04-26 ENCOUNTER — Emergency Department (HOSPITAL_COMMUNITY): Payer: Medicare HMO

## 2023-04-26 ENCOUNTER — Encounter (HOSPITAL_COMMUNITY): Payer: Self-pay | Admitting: Emergency Medicine

## 2023-04-26 ENCOUNTER — Other Ambulatory Visit: Payer: Self-pay

## 2023-04-26 DIAGNOSIS — Z7982 Long term (current) use of aspirin: Secondary | ICD-10-CM | POA: Insufficient documentation

## 2023-04-26 DIAGNOSIS — Z20822 Contact with and (suspected) exposure to covid-19: Secondary | ICD-10-CM | POA: Diagnosis not present

## 2023-04-26 DIAGNOSIS — J90 Pleural effusion, not elsewhere classified: Secondary | ICD-10-CM | POA: Diagnosis not present

## 2023-04-26 DIAGNOSIS — I309 Acute pericarditis, unspecified: Secondary | ICD-10-CM

## 2023-04-26 DIAGNOSIS — N189 Chronic kidney disease, unspecified: Secondary | ICD-10-CM | POA: Insufficient documentation

## 2023-04-26 DIAGNOSIS — Z79899 Other long term (current) drug therapy: Secondary | ICD-10-CM | POA: Insufficient documentation

## 2023-04-26 DIAGNOSIS — Z7984 Long term (current) use of oral hypoglycemic drugs: Secondary | ICD-10-CM | POA: Diagnosis not present

## 2023-04-26 DIAGNOSIS — I129 Hypertensive chronic kidney disease with stage 1 through stage 4 chronic kidney disease, or unspecified chronic kidney disease: Secondary | ICD-10-CM | POA: Insufficient documentation

## 2023-04-26 DIAGNOSIS — R0602 Shortness of breath: Secondary | ICD-10-CM | POA: Diagnosis not present

## 2023-04-26 DIAGNOSIS — Z950 Presence of cardiac pacemaker: Secondary | ICD-10-CM | POA: Diagnosis not present

## 2023-04-26 DIAGNOSIS — I2510 Atherosclerotic heart disease of native coronary artery without angina pectoris: Secondary | ICD-10-CM | POA: Diagnosis not present

## 2023-04-26 DIAGNOSIS — R059 Cough, unspecified: Secondary | ICD-10-CM | POA: Diagnosis not present

## 2023-04-26 DIAGNOSIS — I700 Atherosclerosis of aorta: Secondary | ICD-10-CM | POA: Diagnosis not present

## 2023-04-26 DIAGNOSIS — I319 Disease of pericardium, unspecified: Secondary | ICD-10-CM | POA: Diagnosis not present

## 2023-04-26 DIAGNOSIS — I3139 Other pericardial effusion (noninflammatory): Secondary | ICD-10-CM | POA: Diagnosis not present

## 2023-04-26 DIAGNOSIS — J9811 Atelectasis: Secondary | ICD-10-CM | POA: Diagnosis not present

## 2023-04-26 DIAGNOSIS — E1122 Type 2 diabetes mellitus with diabetic chronic kidney disease: Secondary | ICD-10-CM | POA: Diagnosis not present

## 2023-04-26 LAB — BRAIN NATRIURETIC PEPTIDE: B Natriuretic Peptide: 80 pg/mL (ref 0.0–100.0)

## 2023-04-26 LAB — URINALYSIS, ROUTINE W REFLEX MICROSCOPIC
Bacteria, UA: NONE SEEN
Bilirubin Urine: NEGATIVE
Glucose, UA: NEGATIVE mg/dL
Hgb urine dipstick: NEGATIVE
Ketones, ur: NEGATIVE mg/dL
Leukocytes,Ua: NEGATIVE
Nitrite: NEGATIVE
Protein, ur: 30 mg/dL — AB
Specific Gravity, Urine: 1.018 (ref 1.005–1.030)
pH: 5 (ref 5.0–8.0)

## 2023-04-26 LAB — COMPREHENSIVE METABOLIC PANEL
ALT: 24 U/L (ref 0–44)
AST: 19 U/L (ref 15–41)
Albumin: 3 g/dL — ABNORMAL LOW (ref 3.5–5.0)
Alkaline Phosphatase: 65 U/L (ref 38–126)
Anion gap: 10 (ref 5–15)
BUN: 22 mg/dL (ref 8–23)
CO2: 24 mmol/L (ref 22–32)
Calcium: 8.8 mg/dL — ABNORMAL LOW (ref 8.9–10.3)
Chloride: 100 mmol/L (ref 98–111)
Creatinine, Ser: 1.39 mg/dL — ABNORMAL HIGH (ref 0.61–1.24)
GFR, Estimated: 52 mL/min — ABNORMAL LOW (ref >60)
Glucose, Bld: 139 mg/dL — ABNORMAL HIGH (ref 70–99)
Potassium: 3.8 mmol/L (ref 3.5–5.1)
Sodium: 134 mmol/L — ABNORMAL LOW (ref 135–145)
Total Bilirubin: 0.9 mg/dL (ref 0.0–1.2)
Total Protein: 6.5 g/dL (ref 6.5–8.1)

## 2023-04-26 LAB — CBC WITH DIFFERENTIAL/PLATELET
Abs Immature Granulocytes: 0.04 10*3/uL (ref 0.00–0.07)
Basophils Absolute: 0 10*3/uL (ref 0.0–0.1)
Basophils Relative: 0 %
Eosinophils Absolute: 0.1 10*3/uL (ref 0.0–0.5)
Eosinophils Relative: 1 %
HCT: 32.8 % — ABNORMAL LOW (ref 39.0–52.0)
Hemoglobin: 10.8 g/dL — ABNORMAL LOW (ref 13.0–17.0)
Immature Granulocytes: 0 %
Lymphocytes Relative: 8 %
Lymphs Abs: 0.8 10*3/uL (ref 0.7–4.0)
MCH: 28.3 pg (ref 26.0–34.0)
MCHC: 32.9 g/dL (ref 30.0–36.0)
MCV: 86.1 fL (ref 80.0–100.0)
Monocytes Absolute: 0.7 10*3/uL (ref 0.1–1.0)
Monocytes Relative: 7 %
Neutro Abs: 7.9 10*3/uL — ABNORMAL HIGH (ref 1.7–7.7)
Neutrophils Relative %: 84 %
Platelets: 292 10*3/uL (ref 150–400)
RBC: 3.81 MIL/uL — ABNORMAL LOW (ref 4.22–5.81)
RDW: 13.2 % (ref 11.5–15.5)
WBC: 9.5 10*3/uL (ref 4.0–10.5)
nRBC: 0 % (ref 0.0–0.2)

## 2023-04-26 LAB — D-DIMER, QUANTITATIVE: D-Dimer, Quant: 1 ug{FEU}/mL — ABNORMAL HIGH (ref 0.00–0.50)

## 2023-04-26 LAB — RESP PANEL BY RT-PCR (RSV, FLU A&B, COVID)  RVPGX2
Influenza A by PCR: NEGATIVE
Influenza B by PCR: NEGATIVE
Resp Syncytial Virus by PCR: NEGATIVE
SARS Coronavirus 2 by RT PCR: NEGATIVE

## 2023-04-26 LAB — TROPONIN I (HIGH SENSITIVITY)
Troponin I (High Sensitivity): 47 ng/L — ABNORMAL HIGH (ref <18)
Troponin I (High Sensitivity): 40 ng/L — ABNORMAL HIGH (ref <18)

## 2023-04-26 LAB — SARS CORONAVIRUS 2 BY RT PCR: SARS Coronavirus 2 by RT PCR: NEGATIVE

## 2023-04-26 LAB — CBG MONITORING, ED: Glucose-Capillary: 133 mg/dL — ABNORMAL HIGH (ref 70–99)

## 2023-04-26 MED ORDER — IOHEXOL 350 MG/ML SOLN
75.0000 mL | Freq: Once | INTRAVENOUS | Status: AC | PRN
  Administered 2023-04-26: 75 mL via INTRAVENOUS

## 2023-04-26 MED ORDER — ASPIRIN 325 MG PO TABS
650.0000 mg | ORAL_TABLET | Freq: Once | ORAL | Status: AC
  Administered 2023-04-26: 650 mg via ORAL
  Filled 2023-04-26: qty 2

## 2023-04-26 MED ORDER — COLCHICINE 0.6 MG PO TABS: 0.6000 mg | ORAL_TABLET | Freq: Two times a day (BID) | ORAL | 0 refills | Status: DC

## 2023-04-26 MED ORDER — ASPIRIN 325 MG PO TBEC: 650.0000 mg | DELAYED_RELEASE_TABLET | Freq: Two times a day (BID) | ORAL | 0 refills | Status: AC

## 2023-04-26 MED ORDER — LORAZEPAM 2 MG/ML IJ SOLN
1.0000 mg | Freq: Once | INTRAMUSCULAR | Status: AC
  Administered 2023-04-26: 1 mg via INTRAVENOUS
  Filled 2023-04-26: qty 1

## 2023-04-26 MED ORDER — COLCHICINE 0.6 MG PO TABS
0.6000 mg | ORAL_TABLET | Freq: Once | ORAL | Status: AC
  Administered 2023-04-26: 0.6 mg via ORAL
  Filled 2023-04-26: qty 1

## 2023-04-26 NOTE — Progress Notes (Signed)
   Called from the [facility], ER about Grant Bowers who has had progressive fatigue and shortness of breath.  He was seen in cardiac EP a week ago for follow-up of his pacemaker.  He was noted to be somewhat tachycardic and had had several weeks of symptoms concerning for viral illness.  An echocardiogram was ordered and performed yesterday.  This demonstrated normal systolic function, however there was a small pericardial effusion.  No evidence of tamponade physiology.  There was a suggestion of a trivial effusion by echo in 2022.  ER workup appears negative with minimally elevated flat troponins in the 40s and a low BNP of 80.  CT angio was performed which was negative for PE but did show trivial bilateral pleural effusions and small pericardial effusion.  Overall findings are likely consistent with a viral pericarditis.  I discussed options with the emergency department physician including admission and monitoring versus outpatient therapy.  It would be unlikely that additional inpatient testing would be of benefit and there are no beds available currently.  Would consider empiric therapy with colchicine 0.6 mg twice daily x 3 months as tolerated and aspirin 650 mg TID for 2 weeks.  We can have the [facility] office contact the patient tomorrow to schedule a follow-up appointment.  Would also see if ESR/CRP can be added to the bloodwork.  Chrystie Nose, MD, [facility], FACP  [place]  [facility]  Medical Director of the Advanced Lipid Disorders &  Cardiovascular Risk Reduction Clinic Diplomate of the American Board of Clinical Lipidology Attending Cardiologist  Direct Dial: [phone]  Fax: [phone]  Website:  [email or web address]

## 2023-04-26 NOTE — ED Triage Notes (Addendum)
Pt presents with SOB and dizziness x 1 month, had echo performed in [place] on yesterday and PCP Dr. Oliver Barre in [place], pacemaker in 2022.

## 2023-04-26 NOTE — Discharge Instructions (Addendum)
You were seen in the ER today for concerns of shortness of breath. Your labs and imaging were concerning from some level of heart irritation with a small pericardial effusion likely from a recent viral infection. I spoke with the cardiologist who advised starting you on colchicine and aspirin to help treat this. I have placed a referral to cardiology for outpatient follow up as well. Please plan on following up with them. If any new or worsening symptoms develop, please return to the ER.

## 2023-04-26 NOTE — Telephone Encounter (Signed)
Left detailed message that he will need to have his Primary care physician to place order for Carotid study.

## 2023-04-26 NOTE — ED Notes (Signed)
Lab at bedside

## 2023-04-26 NOTE — ED Provider Notes (Signed)
[place] EMERGENCY DEPARTMENT AT [facility] Provider Note   CSN: [id number] Arrival date & time: 04/26/23  1330     History Chief Complaint  Patient presents with   Shortness of Breath    Grant Bowers is a 78 y.o. male.  Patient with past history significant for trifascicular block with pacemaker placement, hypertension, diabetes, CKD here with concerns of shortness of breath.  He endorses had some associated shortness of breath with dizziness for the last month or so.  Patient reports an echocardiogram performed yesterday and was seen by cardiology earlier this week but concerned that symptoms have not been improving.  States that he is able to walk maybe 50 feet before feeling out of breath.  Denies any notable leg swelling or weight gain.  No hemoptysis.  No dyspnea at rest.   Shortness of Breath      Home Medications Prior to Admission medications   Medication Sig Start Date End Date Taking? Authorizing Provider  acetaminophen (TYLENOL) 325 MG tablet Take 650 mg by mouth every 6 (six) hours as needed for fever.   Yes [provider]  alfuzosin (UROXATRAL) 10 MG 24 hr tablet Take 10 mg by mouth at bedtime. 12/08/21  Yes [provider]  amLODipine (NORVASC) 10 MG tablet TAKE 1 TABLET EVERY DAY ANNUAL APPT DUE IN [place] MUST SEE PROVIDER FOR FUTURE REFILLS 04/09/23  Yes [name], MD  aspirin EC 325 MG tablet Take 2 tablets (650 mg total) by mouth in the morning and at bedtime. 04/26/23 05/26/23 Yes Smitty Knudsen, PA-C  aspirin EC 81 MG tablet Take 81 mg by mouth daily. Swallow whole.   Yes [provider]  cetirizine (ZYRTEC) 10 MG tablet TAKE 1 TABLET EVERY DAY 04/13/22  Yes [name], MD  Chlorpheniramine-DM (CORICIDIN HBP COUGH/COLD PO) Take 1 tablet by mouth daily as needed (cough/cold).   Yes [provider]  Cholecalciferol (VITAMIN D3 SUPER STRENGTH) 50 MCG (2000 UT) TABS Take 2,000 Units by mouth daily.   Yes [provider]  colchicine 0.6 MG tablet Take 1 tablet (0.6 mg total) by mouth 2 (two) times daily. 04/26/23 05/26/23 Yes Smitty Knudsen, PA-C  hydrochlorothiazide (MICROZIDE) 12.5 MG capsule TAKE 1 CAPSULE EVERY DAY 04/09/23  Yes [name], MD  irbesartan (AVAPRO) 300 MG tablet TAKE 1 TABLET AT BEDTIME. ANNUAL APPT DUE IN [place]. MUST SEE PROVIDER FOR FUTURE REFILLS 04/26/23  Yes [name], MD  metFORMIN (GLUCOPHAGE-XR) 500 MG 24 hr tablet TAKE 1 TABLET EVERY DAY WITH BREAKFAST 04/17/23  Yes [name], MD  omeprazole (PRILOSEC) 10 MG capsule TAKE 1 CAPSULE EVERY DAY ANNUAL APPT DUE IN [place] MUST SEE PROVIDER FOR FUTURE REFILLS 04/09/23  Yes [name], MD  rosuvastatin (CRESTOR) 20 MG tablet TAKE 1 TABLET EVERY DAY 04/09/23  Yes [name], MD  zinc gluconate 50 MG tablet Take 50 mg by mouth daily.   Yes [provider]  Alcohol Swabs (B-D SINGLE USE SWABS REGULAR) PADS Use as directed once per day E11.9 12/29/20   [name], MD  Blood Glucose Calibration (TRUE METRIX LEVEL 1) Low SOLN Use as directed once per day E11.9 12/29/20   [name], MD  Blood Glucose Monitoring Suppl (TRUE METRIX METER) DEVI USE AS DIRECTED TO test blood sugar EVERY DAY 04/11/22   [provider]  glucose blood (TRUE METRIX BLOOD GLUCOSE TEST) test strip TEST BLOOD SUGAR TWO TIMES DAILY AS  INSTRUCTED 09/14/22   [name], MD  TRUEplus Lancets 33G MISC Use as directed once per day E11.9 12/29/20   [name], MD      Allergies    Patient has no known allergies.    Review of Systems   Review of Systems  Respiratory:  Positive for shortness of breath.   All other systems reviewed and are negative.   Physical Exam Updated Vital Signs BP 121/61   Pulse 83   Temp 98.8 F (37.1 C) (Oral)   Resp 15   Ht 6' (1.829 m)   Wt 90.7 kg   SpO2 96%   BMI 27.12 kg/m  Physical Exam Vitals and nursing note reviewed.  Constitutional:      General: He is not in acute distress.     Appearance: He is well-developed. He is not ill-appearing.  HENT:     Head: Normocephalic and atraumatic.  Eyes:     Conjunctiva/sclera: Conjunctivae normal.  Cardiovascular:     Rate and Rhythm: Normal rate and regular rhythm.     Heart sounds: No murmur heard.    Comments: No audible friction rub. Pulmonary:     Effort: Pulmonary effort is normal. No tachypnea, bradypnea or respiratory distress.     Breath sounds: Normal breath sounds. No decreased breath sounds, wheezing, rhonchi or rales.  Abdominal:     Palpations: Abdomen is soft.     Tenderness: There is no abdominal tenderness.  Musculoskeletal:        General: No swelling.     Cervical back: Neck supple.  Skin:    General: Skin is warm and dry.     Capillary Refill: Capillary refill takes less than 2 seconds.  Neurological:     Mental Status: He is alert.  Psychiatric:        Mood and Affect: Mood normal.     ED Results / Procedures / Treatments   Labs (all labs ordered are listed, but only abnormal results are displayed) Labs Reviewed  CBC WITH DIFFERENTIAL/PLATELET - Abnormal; Notable for the following components:      Result Value   RBC 3.81 (*)    Hemoglobin 10.8 (*)    HCT 32.8 (*)    Neutro Abs 7.9 (*)    All other components within normal limits  COMPREHENSIVE METABOLIC PANEL - Abnormal; Notable for the following components:   Sodium 134 (*)    Glucose, Bld 139 (*)    Creatinine, Ser 1.39 (*)    Calcium 8.8 (*)    Albumin 3.0 (*)    GFR, Estimated 52 (*)    All other components within normal limits  URINALYSIS, ROUTINE W REFLEX MICROSCOPIC - Abnormal; Notable for the following components:   Protein, ur 30 (*)    All other components within normal limits  D-DIMER, QUANTITATIVE - Abnormal; Notable for the following components:   D-Dimer, Quant 1.00 (*)    All other components within normal limits  CBG MONITORING, ED - Abnormal; Notable for the following components:   Glucose-Capillary 133 (*)     All other components within normal limits  TROPONIN I (HIGH SENSITIVITY) - Abnormal; Notable for the following components:   Troponin I (High Sensitivity) 47 (*)    All other components within normal limits  TROPONIN I (HIGH SENSITIVITY) - Abnormal; Notable for the following components:   Troponin I (High Sensitivity) 40 (*)    All other components within normal limits  SARS CORONAVIRUS 2 BY RT PCR  RESP PANEL BY RT-PCR (RSV, FLU A&B, COVID)  [id number]  BRAIN NATRIURETIC PEPTIDE    EKG EKG Interpretation Date/Time:  Thursday April 26 2023 13:39:40 EST Ventricular Rate:  99 PR Interval:  212 QRS Duration:  136 QT Interval:  400 QTC Calculation: 513 R Axis:   15  Text Interpretation: Sinus rhythm with 1st degree A-V block with occasional Premature ventricular complexes Left bundle branch block Abnormal ECG When compared with ECG of 17-Apr-2023 11:26, Fusion complexes are no longer Present Premature ventricular complexes are now Present Abberant conduction is no longer Present Confirmed by Alvino Blood ([id number]) on 04/26/2023 3:27:05 PM  Radiology CT Angio Chest PE W and/or Wo Contrast Result Date: 04/26/2023 CLINICAL DATA:  Pulmonary embolism (PE) suspected, low to intermediate prob, positive D-dimer Shortness of breath and dizziness. EXAM: CT ANGIOGRAPHY CHEST WITH CONTRAST TECHNIQUE: Multidetector CT imaging of the chest was performed using the standard protocol during bolus administration of intravenous contrast. Multiplanar CT image reconstructions and MIPs were obtained to evaluate the vascular anatomy. RADIATION DOSE REDUCTION: This exam was performed according to the departmental dose-optimization program which includes automated exposure control, adjustment of the mA and/or kV according to patient size and/or use of iterative reconstruction technique. CONTRAST:  75mL OMNIPAQUE IOHEXOL 350 MG/ML SOLN COMPARISON:  Radiograph earlier today FINDINGS: Cardiovascular: There are no filling  defects within the pulmonary arteries to suggest pulmonary embolus. Aortic atherosclerosis. No aneurysm or acute aortic findings. Pacemaker in place. There are coronary artery calcifications. Heart is normal in size. Small pericardial effusion measuring up to 12 mm. Mediastinum/Nodes: Shotty right hilar and subcarinal lymph nodes measuring up to 10 mm. 8 mm left hilar node. Minimal hiatal hernia. No thyroid nodule. Lungs/Pleura: Trace bilateral pleural effusions. Subsegmental atelectasis in the left lower lobe. No confluent airspace disease. No pulmonary edema. Trachea and central airways are clear. Upper Abdomen: Scattered subcentimeter hypodensities in the liver likely representing cysts, the largest measures 17 mm in the right hepatic dome. Musculoskeletal: Diffuse anterior spurring throughout the thoracic spine. Posterior spurring at the T5-T6 level. Review of the MIP images confirms the above findings. IMPRESSION: 1. No pulmonary embolus. 2. Trace bilateral pleural effusions. Subsegmental atelectasis in the left lower lobe. 3. Small pericardial effusion. 4. Coronary artery calcifications. Aortic Atherosclerosis (ICD10-I70.0). Electronically Signed   By: Narda Rutherford M.D.   On: 04/26/2023 17:14   DG Chest 2 View Result Date: 04/26/2023 CLINICAL DATA:  Shortness of breath and cough. EXAM: CHEST - 2 VIEW COMPARISON:  Chest radiograph dated 07/09/2020. FINDINGS: No focal consolidation, pleural effusion, or pneumothorax. The cardiac silhouette is within normal limits. Atherosclerotic calcification of the aorta. No acute osseous pathology. Left pectoral pacemaker device. IMPRESSION: No active cardiopulmonary disease. Electronically Signed   By: Elgie Collard M.D.   On: 04/26/2023 15:04   ECHOCARDIOGRAM COMPLETE Result Date: 04/25/2023    ECHOCARDIOGRAM REPORT   Patient Name:   TOSHUA WISOR Date of Exam: 04/25/2023 Medical Rec #:  [id number]     Height:       72.0 in Accession #:    [id number]    Weight:        199.2 lb Date of Birth:  1946/02/09    BSA:          2.127 m Patient Age:    77 years      BP:           110/66 mmHg Patient Gender: M             HR:  91 bpm. Exam Location:  [place] Procedure: 2D Echo, Cardiac Doppler and Color Doppler Indications:    R06.9 DOE  History:        Patient has prior history of Echocardiogram examinations, most                 recent 07/29/2020. Pacemaker, Arrythmias:LBBB, RBBB and                 Bradycardia, Signs/Symptoms:Dizziness/Lightheadedness; Risk                 Factors:Hypertension, Sleep Apnea and Non-Smoker.  Sonographer:    Dominica Severin RCS, RVS Referring Phys: [id number] Mariam Dollar TILLERY IMPRESSIONS  1. Left ventricular ejection fraction, by estimation, is 55 to 60%. The left ventricle has normal function. The left ventricle has no regional wall motion abnormalities. Left ventricular diastolic parameters are consistent with Grade I diastolic dysfunction (impaired relaxation).  2. Right ventricular systolic function is normal. The right ventricular size is normal. Tricuspid regurgitation signal is inadequate for assessing PA pressure.  3. Left atrial size was mild to moderately dilated.  4. A small pericardial effusion is present. The pericardial effusion is circumferential.  5. The mitral valve is normal in structure. Mild mitral valve regurgitation. No evidence of mitral stenosis.  6. The aortic valve is tricuspid. Aortic valve regurgitation is not visualized. No aortic stenosis is present.  7. The inferior vena cava is normal in size with greater than 50% respiratory variability, suggesting right atrial pressure of 3 mmHg. Comparison(s): No significant change from prior study. FINDINGS  Left Ventricle: Left ventricular ejection fraction, by estimation, is 55 to 60%. The left ventricle has normal function. The left ventricle has no regional wall motion abnormalities. The left ventricular internal cavity size was normal in size. There is  no left  ventricular hypertrophy. Left ventricular diastolic parameters are consistent with Grade I diastolic dysfunction (impaired relaxation). Normal left ventricular filling pressure. Right Ventricle: The right ventricular size is normal. No increase in right ventricular wall thickness. Right ventricular systolic function is normal. Tricuspid regurgitation signal is inadequate for assessing PA pressure. Left Atrium: Left atrial size was mild to moderately dilated. Right Atrium: Right atrial size was normal in size. Pericardium: A small pericardial effusion is present. The pericardial effusion is circumferential. Mitral Valve: The mitral valve is normal in structure. Mild mitral valve regurgitation, with posteriorly-directed jet. No evidence of mitral valve stenosis. MV peak gradient, 5.2 mmHg. The mean mitral valve gradient is 3.0 mmHg. Tricuspid Valve: The tricuspid valve is normal in structure. Tricuspid valve regurgitation is not demonstrated. No evidence of tricuspid stenosis. Aortic Valve: The aortic valve is tricuspid. Aortic valve regurgitation is not visualized. No aortic stenosis is present. Aortic valve mean gradient measures 3.0 mmHg. Aortic valve peak gradient measures 5.6 mmHg. Aortic valve area, by VTI measures 3.55 cm. Pulmonic Valve: The pulmonic valve was normal in structure. Pulmonic valve regurgitation is not visualized. No evidence of pulmonic stenosis. Aorta: The aortic root and ascending aorta are structurally normal, with no evidence of dilitation. Venous: The inferior vena cava is normal in size with greater than 50% respiratory variability, suggesting right atrial pressure of 3 mmHg. IAS/Shunts: No atrial level shunt detected by color flow Doppler.  LEFT VENTRICLE PLAX 2D LVIDd:         5.50 cm     Diastology LVIDs:         2.80 cm     LV e' medial:    6.42 cm/s LV  PW:         1.20 cm     LV E/e' medial:  10.8 LV IVS:        1.00 cm     LV e' lateral:   16.70 cm/s LVOT diam:     2.30 cm     LV  E/e' lateral: 4.2 LV SV:         73 LV SV Index:   34 LVOT Area:     4.15 cm  LV Volumes (MOD) LV vol d, MOD A2C: 85.3 ml LV vol d, MOD A4C: 92.3 ml LV vol s, MOD A2C: 34.6 ml LV vol s, MOD A4C: 45.3 ml LV SV MOD A2C:     50.7 ml LV SV MOD A4C:     92.3 ml LV SV MOD BP:      47.6 ml RIGHT VENTRICLE RV Basal diam:  3.70 cm RV Mid diam:    3.20 cm RV S prime:     17.50 cm/s TAPSE (M-mode): 2.6 cm LEFT ATRIUM             Index        RIGHT ATRIUM           Index LA diam:        4.00 cm 1.88 cm/m   RA Area:     10.70 cm LA Vol (A2C):   77.3 ml 36.34 ml/m  RA Volume:   20.90 ml  9.83 ml/m LA Vol (A4C):   78.8 ml 37.05 ml/m LA Biplane Vol: 84.6 ml 39.78 ml/m  AORTIC VALVE                    PULMONIC VALVE AV Area (Vmax):    3.45 cm     PV Vmax:       1.05 m/s AV Area (Vmean):   3.36 cm     PV Peak grad:  4.4 mmHg AV Area (VTI):     3.55 cm AV Vmax:           118.00 cm/s AV Vmean:          77.500 cm/s AV VTI:            0.206 m AV Peak Grad:      5.6 mmHg AV Mean Grad:      3.0 mmHg LVOT Vmax:         97.90 cm/s LVOT Vmean:        62.700 cm/s LVOT VTI:          0.176 m LVOT/AV VTI ratio: 0.85  AORTA Ao Root diam: 3.50 cm Ao Asc diam:  3.70 cm MITRAL VALVE MV Area (PHT): 4.54 cm    SHUNTS MV Area VTI:   2.62 cm    Systemic VTI:  0.18 m MV Peak grad:  5.2 mmHg    Systemic Diam: 2.30 cm MV Mean grad:  3.0 mmHg MV Vmax:       1.14 m/s MV Vmean:      78.7 cm/s MV Decel Time: 167 msec MV E velocity: 69.50 cm/s MV A velocity: 88.10 cm/s MV E/A ratio:  0.79 Vishnu Priya Mallipeddi Electronically signed by Winfield Rast Mallipeddi Signature Date/Time: 04/25/2023/5:05:03 PM    Final     Procedures Procedures    Medications Ordered in ED Medications  colchicine tablet 0.6 mg (has no administration in time range)  aspirin tablet 650 mg (has no administration in time range)  iohexol (OMNIPAQUE) 350 MG/ML injection 75  mL (75 mLs Intravenous Contrast Given 04/26/23 1645)  LORazepam (ATIVAN) injection 1 mg (1 mg  Intravenous Given 04/26/23 1638)    ED Course/ Medical Decision Making/ A&P Clinical Course as of 04/26/23 1839  Thu Apr 26, 2023  1526 DG Chest 2 View [WS]    Clinical Course User Index [WS] Lonell Grandchild, MD                                 Medical Decision Making Amount and/or Complexity of Data Reviewed Labs: ordered. Radiology: ordered. Decision-making details documented in ED Course.  Risk Prescription drug management.   This patient presents to the ED for concern of shortness of breath.  Differential diagnosis includes COPD, pneumonia, bronchitis, pericarditis, pleural effusion   Lab Tests:  I Ordered, and personally interpreted labs.  The pertinent results include: CBC with evidence of anemia, CMP with slightly worsened kidney function creatinine slightly elevated at 1.39 slightly worse than 9 days ago at 1.24.  BNP normal at 80, troponin elevated at 47 with delta troponin at 40, D-dimer elevated at 1.0.  Urinalysis without evidence of infection but some proteinuria seen.   Imaging Studies ordered:  I ordered imaging studies including chest x-ray, CT angio chest I independently visualized and interpreted imaging which showed no acute cardiopulmonary process seen,No pulmonary embolus. 2. Trace bilateral pleural effusions. Subsegmental atelectasis in the left lower lobe. 3. Small pericardial effusion. 4. Coronary artery calcifications. Aortic Atherosclerosis I agree with the radiologist interpretation   Medicines ordered and prescription drug management:  I ordered medication including Ativan, colchicine, ASA for anxiety regarding CT imaging, pericarditis Reevaluation of the patient after these medicines showed that the patient improved I have reviewed the patients home medicines and have made adjustments as needed   Problem List / ED Course:  Patient with past history significant for trifascicular block with pacemaker placement, hypertension, diabetes, CAD  here with concerns of shortness of breath.  He reports that his had some associate shortness of breath or dizziness for the last month or so.  He typically feels that he has worsening shortness of breath with exertion.  Denies any leg swelling hemoptysis.  No sick contacts recently.  Denies any nausea, vomiting, or diarrhea.  At this time, reports that he is able to walk about 50 feet before needing to rest due to shortness of breath.  Denies any chest pain at this time. Labs obtained which show slightly worsening anemia, CMP with some evidence of dehydration with creatinine slightly elevated 1.39, troponin elevated at 47 with repeat troponin at 48, D-dimer elevated 1.0, BMP unremarkable NAD.  Urinalysis with no signs of infection.  Chest x-ray is unremarkable.  Given elevation in D-dimer, will obtain CT angio chest. CT angio chest is negative for PE.  However, there is some concerns for bilateral pleural effusion trace in size as well as a small pericardial effusion.  These may be causing patient's symptoms.  Given these findings and elevated troponin levels, will consult cardiology for further recommendations.  Patient not acutely fluid overloaded so diuretics not acutely indicated.  Patient otherwise comfortable and stable at this time with oxygen saturation 97 to 96% on room air.  Patient is not tachycardic, febrile, and is normotensive. I spoke with Dr. Rennis Golden, cardiology, who advised that he suspect patient is currently dealing with pericarditis given workup and recently reported URI type infection several weeks ago. Since patient had echocardiogram performed yesterday,  advised that admission would mostly be indicated to start patient on colchicine and aspirin. Otherwise, patient can be discharged home and follow up with cardiology outpatient if well-appearing. Discussed these recommendations with patient and his spouse. Still remaining hemodynamically stable. Patient would prefer to be discharged home and  follow up with cardiologist outpatient. Advised strict return precautions such as worsening SOB, fever, AMS, or other new concerning symptoms. Prescriptions for colchicine and aspirin sent to patient's pharmacy with initial doses started tonight. Patient otherwise stable and discharged home.   Final Clinical Impression(s) / ED Diagnoses Final diagnoses:  SOB (shortness of breath)  Acute pericarditis, unspecified type    Rx / DC Orders ED Discharge Orders          Ordered    colchicine 0.6 MG tablet  2 times daily        04/26/23 1834    aspirin EC 325 MG tablet  2 times daily        04/26/23 1834    Ambulatory referral to Cardiology       Comments: If you have not heard from the Cardiology office within the next 72 hours please call [phone].   04/26/23 1836              Smitty Knudsen, PA-C 04/26/23 1839    Lonell Grandchild, MD 04/27/23 1515

## 2023-05-02 ENCOUNTER — Telehealth: Payer: Self-pay

## 2023-05-02 NOTE — Progress Notes (Signed)
Transition Care Management Unsuccessful Follow-up Telephone Call  Date of discharge and from where:  04/26/2023 [facility]  Attempts:  1st Attempt  Reason for unsuccessful TCM follow-up call:  Voice mail full  Gavynn Duvall [facility]  [facility] Guide Direct Dial: [phone]  Fax: [phone] Website: [email or web address]

## 2023-05-03 ENCOUNTER — Ambulatory Visit: Payer: Medicare HMO | Attending: Nurse Practitioner | Admitting: Nurse Practitioner

## 2023-05-03 ENCOUNTER — Encounter: Payer: Self-pay | Admitting: Nurse Practitioner

## 2023-05-03 VITALS — BP 120/70 | HR 111 | Ht 72.0 in | Wt 192.6 lb

## 2023-05-03 DIAGNOSIS — R197 Diarrhea, unspecified: Secondary | ICD-10-CM | POA: Diagnosis not present

## 2023-05-03 DIAGNOSIS — I3139 Other pericardial effusion (noninflammatory): Secondary | ICD-10-CM | POA: Diagnosis not present

## 2023-05-03 DIAGNOSIS — R110 Nausea: Secondary | ICD-10-CM | POA: Diagnosis not present

## 2023-05-03 DIAGNOSIS — I10 Essential (primary) hypertension: Secondary | ICD-10-CM

## 2023-05-03 DIAGNOSIS — R1110 Vomiting, unspecified: Secondary | ICD-10-CM

## 2023-05-03 DIAGNOSIS — N1831 Chronic kidney disease, stage 3a: Secondary | ICD-10-CM

## 2023-05-03 DIAGNOSIS — Z950 Presence of cardiac pacemaker: Secondary | ICD-10-CM | POA: Diagnosis not present

## 2023-05-03 DIAGNOSIS — E782 Mixed hyperlipidemia: Secondary | ICD-10-CM | POA: Diagnosis not present

## 2023-05-03 DIAGNOSIS — R5383 Other fatigue: Secondary | ICD-10-CM

## 2023-05-03 DIAGNOSIS — I6523 Occlusion and stenosis of bilateral carotid arteries: Secondary | ICD-10-CM

## 2023-05-03 DIAGNOSIS — E1165 Type 2 diabetes mellitus with hyperglycemia: Secondary | ICD-10-CM | POA: Diagnosis not present

## 2023-05-03 DIAGNOSIS — R0609 Other forms of dyspnea: Secondary | ICD-10-CM | POA: Diagnosis not present

## 2023-05-03 DIAGNOSIS — I319 Disease of pericardium, unspecified: Secondary | ICD-10-CM

## 2023-05-03 NOTE — Progress Notes (Signed)
Cardiology Office Note:  .   Date:  05/03/2023 ID:  Grant Bowers, DOB 1945-11-26, MRN [id number] PCP: [name], MD  [facility] Providers Cardiologist:  Chilton Si, MD Electrophysiologist:  Maurice Small, MD    History of Present Illness: .   Grant Bowers is a very pleasant 78 y.o. male with a PMH of symptomatic trifascicular block, s/p PPM in 2022, hypertension, CKD, fatigue, who presents today for ED follow-up.  Last seen by Graciella Freer, PA-C  on April 17, 2023 for increased fatigue, reported sleeping most of the day.  Patient stated symptoms began with chills, fever and cough with symptoms progressing to persistent fatigue and elevated resting heart rates.  Patient was concerned his pacemaker was malfunctioning.  Pacemaker interrogated in office.  It was noted pacemaker was functioning normally.  Fatigue felt to be due to postviral syndrome.  Echocardiogram was arranged and was benign.   ED visit on April 26, 2023 for shortness of breath with dizziness x 1 month.  Patient stated his symptoms have not been improving.  Reported walking around 50 feet before feeling short of breath.  CBC showed evidence of anemia.  CMP showed serum creatinine 1.39.  BNP normal.  Troponin elevated 47, delta troponin 40.  D-dimer elevated 1.0.  CT angio chest was negative for PE, there was some concerns for bilateral pleural effusion trace in size as well as small pericardial effusion.  Cardiology was consulted.  Was felt that due to his recent viral infection, patient had a case of pericarditis causing his symptoms.  Patient was advised regarding admission, however patient preferred to be discharged home and to follow-up outpatient.  Was started on colchicine and aspirin at discharge.  Today he presents for ED follow-up. He states he continues to notice shortness of breath with exertion, says a possible slight improvement in his symptoms but hard to tell. Wife states he is  "sleeping all the time" and has no appetite. Says even washing his hair has made him exhausted. Does admit to HR range from 60's  - low 100's with change of HR of 40 pts in 2 minutes and says he is concerned about this. Denies any chest pain, palpitations, syncope, presyncope, dizziness, orthopnea, PND, swelling or significant weight changes, acute bleeding, or claudication. He is compliant with his medications and wants to know if he should continue to be taking his baby Aspirin in addition to his ASA 650 mg BID. Does admit to some N/V/D with his recent illness.   ROS: Negative. See HPI.   Studies Reviewed: Marland Kitchen    EKG:  EKG Interpretation Date/Time:  Thursday May 03 2023 14:16:47 EST Ventricular Rate:  108 PR Interval:    QRS Duration:  136 QT Interval:  422 QTC Calculation: 565 R Axis:   -56  Text Interpretation: Critical Test Result: Long QTc Wide QRS rhythm Left axis deviation Non-specific intra-ventricular conduction block Minimal voltage criteria for LVH, may be normal variant ( Cornell product ) Cannot rule out Septal infarct , age undetermined When compared with ECG of 26-Apr-2023 13:39, Wide QRS rhythm has replaced Sinus rhythm Confirmed by Sharlene Dory [phone]) on 05/03/2023 2:22:47 PM   Echo complete 04/2023:  1. Left ventricular ejection fraction, by estimation, is 55 to 60%. The  left ventricle has normal function. The left ventricle has no regional  wall motion abnormalities. Left ventricular diastolic parameters are  consistent with Grade I diastolic  dysfunction (impaired relaxation).   2. Right ventricular systolic function  is normal. The right ventricular  size is normal. Tricuspid regurgitation signal is inadequate for assessing  PA pressure.   3. Left atrial size was mild to moderately dilated.   4. A small pericardial effusion is present. The pericardial effusion is  circumferential.   5. The mitral valve is normal in structure. Mild mitral valve  regurgitation.  No evidence of mitral stenosis.   6. The aortic valve is tricuspid. Aortic valve regurgitation is not  visualized. No aortic stenosis is present.   7. The inferior vena cava is normal in size with greater than 50%  respiratory variability, suggesting right atrial pressure of 3 mmHg.   Comparison(s): No significant change from prior study.  Physical Exam:   VS:  BP 120/70   Pulse (!) 111   Ht 6' (1.829 m)   Wt 192 lb 9.6 oz (87.4 kg)   SpO2 98%   BMI 26.12 kg/m    Wt Readings from Last 3 Encounters:  05/03/23 192 lb 9.6 oz (87.4 kg)  04/26/23 200 lb (90.7 kg)  04/17/23 199 lb 3.2 oz (90.4 kg)    GEN: Well nourished, well developed in no acute distress NECK: No JVD; No carotid bruits CARDIAC: S1/S2, RRR, no murmurs, rubs, gallops RESPIRATORY:  Clear to auscultation without rales, wheezing or rhonchi  ABDOMEN: Soft, non-tender, non-distended EXTREMITIES:  No edema; No deformity   ASSESSMENT AND PLAN: .    Shortness of breath with exertion, pericardial effusion, acute pericarditis? Continues to note DOE despite compliance with his medications. Case d/w DOD Dr. Dina Rich in office today. Recommended/agreed with obtaining ESR/CRP and will update limited Echo to evaluate patient's pericardial effusion. CT angio chest revealed small pericardial effusion with trace bilateral pleural effusions, CXR unremarkable. Instructed patient to stop baby Aspirin and to continue current medication regimen at this time. Will confirm with Dr. Rennis Golden regarding amount of Aspirin as discharge AVS says BID, Dr. Blanchie Dessert note says TID.   Symptomatic trifascicular block/CHB, s/p PPM Most recent in-clinic device check was normal. Provided reassurance to patient. Does have some slight sinus tachycardia on exam, most likely d/t his most recent possible viral infection. Will continue to monitor. Follow-up with EP as scheduled.   HTN BP stable. Discussed to monitor BP at home at least 2 hours after medications  and sitting for 5-10 minutes. No medication changes at this time. Heart healthy diet encouraged.   Fatigue, N/V/D Etiology most likely d/t his recent viral illness. Discussed conservative measures and care and ED precautions discussed. Recommended to f/u with his PCP.     Dispo: Follow-up with Dr. Jenene Slicker or APP in 4-6 weeks or sooner if anything changes.   Signed, Sharlene Dory, NP

## 2023-05-03 NOTE — Patient Instructions (Addendum)
Medication Instructions:  Your physician has recommended you make the following change in your medication:  Please stop your baby aspirin   Labwork: In 1-2 weeks at [facility]   Testing/Procedures: Your physician has requested that you have an echocardiogram. Echocardiography is a painless test that uses sound waves to create images of your heart. It provides your doctor with information about the size and shape of your heart and how well your heart's chambers and valves are working. This procedure takes approximately one hour. There are no restrictions for this procedure. Please do NOT wear cologne, perfume, aftershave, or lotions (deodorant is allowed). Please arrive 15 minutes prior to your appointment time.  Please note: We ask at that you not bring children with you during ultrasound (echo/ vascular) testing. Due to room size and safety concerns, children are not allowed in the ultrasound rooms during exams. Our front office staff cannot provide observation of children in our lobby area while testing is being conducted. An adult accompanying a patient to their appointment will only be allowed in the ultrasound room at the discretion of the ultrasound technician under special circumstances. We apologize for any inconvenience.  Follow-Up: Your physician recommends that you schedule a follow-up appointment in: 4-6 weeks   Any Other Special Instructions Will Be Listed Below (If Applicable).  If you need a refill on your cardiac medications before your next appointment, please call your pharmacy.

## 2023-05-04 LAB — COMPREHENSIVE METABOLIC PANEL
ALT: 16 [IU]/L (ref 0–44)
AST: 20 [IU]/L (ref 0–40)
Albumin: 3.5 g/dL — ABNORMAL LOW (ref 3.8–4.8)
Alkaline Phosphatase: 91 [IU]/L (ref 44–121)
BUN/Creatinine Ratio: 13 (ref 10–24)
BUN: 20 mg/dL (ref 8–27)
Bilirubin Total: 0.5 mg/dL (ref 0.0–1.2)
CO2: 21 mmol/L (ref 20–29)
Calcium: 9.2 mg/dL (ref 8.6–10.2)
Chloride: 101 mmol/L (ref 96–106)
Creatinine, Ser: 1.52 mg/dL — ABNORMAL HIGH (ref 0.76–1.27)
Globulin, Total: 2.5 g/dL (ref 1.5–4.5)
Glucose: 180 mg/dL — ABNORMAL HIGH (ref 70–99)
Potassium: 4.5 mmol/L (ref 3.5–5.2)
Sodium: 138 mmol/L (ref 134–144)
Total Protein: 6 g/dL (ref 6.0–8.5)
eGFR: 47 mL/min/{1.73_m2} — ABNORMAL LOW (ref >59)

## 2023-05-04 LAB — TSH: TSH: 2.6 u[IU]/mL (ref 0.450–4.500)

## 2023-05-04 LAB — LIPID PANEL
Chol/HDL Ratio: 3.2 {ratio} (ref 0.0–5.0)
Cholesterol, Total: 96 mg/dL — ABNORMAL LOW (ref 100–199)
HDL: 30 mg/dL — ABNORMAL LOW (ref >39)
LDL Chol Calc (NIH): 49 mg/dL (ref 0–99)
Triglycerides: 82 mg/dL (ref 0–149)
VLDL Cholesterol Cal: 17 mg/dL (ref 5–40)

## 2023-05-04 LAB — T4, FREE: Free T4: 1.3 ng/dL (ref 0.82–1.77)

## 2023-05-07 ENCOUNTER — Ambulatory Visit: Payer: Medicare HMO | Admitting: "Endocrinology

## 2023-05-07 ENCOUNTER — Telehealth: Payer: Self-pay | Admitting: Nurse Practitioner

## 2023-05-07 NOTE — Telephone Encounter (Signed)
Called patient all numbers are invalid in chart. Forwarding to Dr.Mallipeddi as she sees him in office the beginning of February. Sent patient message to Edon as well

## 2023-05-07 NOTE — Telephone Encounter (Signed)
-----   Message from Sharlene Dory sent at 05/07/2023 11:04 AM EST ----- Please instruct patient on Dr. Blanchie Dessert recommendations (cardiologist who was consulted while he was in the hospital).  He says aspirin 650 mg 3 times a day for pericarditis for 2 weeks.  He says patient should be done or close to completing by now.  Thanks!   Best,  Sharlene Dory, NP ----- Message ----- From: Chrystie Nose, MD Sent: 05/05/2023   3:47 PM EST To: Sharlene Dory, NP  Usually Asprin is 650 mg TID for pericarditis - but only for 2 weeks - he probably should be done or close to done with aspirin for now.  -[place] ----- Message ----- From: Sharlene Dory, NP Sent: 05/04/2023   8:30 AM EST To: Chrystie Nose, MD  Hello Dr. Rennis Golden,   I saw this one for ED follow-up. Discharge papers said to take Aspirin 650 mg BID, and I wanted to confirm with you about dosing of Aspirin of 650 mg TID. Thanks so much! I appreciate your input!   Thanks!   Best,  Sharlene Dory, NP

## 2023-05-09 ENCOUNTER — Encounter: Payer: Self-pay | Admitting: Internal Medicine

## 2023-05-09 DIAGNOSIS — N401 Enlarged prostate with lower urinary tract symptoms: Secondary | ICD-10-CM | POA: Diagnosis not present

## 2023-05-09 DIAGNOSIS — R972 Elevated prostate specific antigen [PSA]: Secondary | ICD-10-CM | POA: Diagnosis not present

## 2023-05-09 DIAGNOSIS — R3121 Asymptomatic microscopic hematuria: Secondary | ICD-10-CM | POA: Diagnosis not present

## 2023-05-09 DIAGNOSIS — Z87442 Personal history of urinary calculi: Secondary | ICD-10-CM | POA: Diagnosis not present

## 2023-05-09 DIAGNOSIS — N5201 Erectile dysfunction due to arterial insufficiency: Secondary | ICD-10-CM | POA: Diagnosis not present

## 2023-05-09 DIAGNOSIS — R3915 Urgency of urination: Secondary | ICD-10-CM | POA: Diagnosis not present

## 2023-05-11 ENCOUNTER — Other Ambulatory Visit (HOSPITAL_BASED_OUTPATIENT_CLINIC_OR_DEPARTMENT_OTHER): Payer: Self-pay | Admitting: Nurse Practitioner

## 2023-05-11 DIAGNOSIS — I319 Disease of pericardium, unspecified: Secondary | ICD-10-CM | POA: Diagnosis not present

## 2023-05-11 DIAGNOSIS — I6523 Occlusion and stenosis of bilateral carotid arteries: Secondary | ICD-10-CM | POA: Diagnosis not present

## 2023-05-11 DIAGNOSIS — I10 Essential (primary) hypertension: Secondary | ICD-10-CM | POA: Diagnosis not present

## 2023-05-11 DIAGNOSIS — N1832 Chronic kidney disease, stage 3b: Secondary | ICD-10-CM | POA: Diagnosis not present

## 2023-05-12 LAB — SEDIMENTATION RATE: Sed Rate: 16 mm/h (ref 0–30)

## 2023-05-12 LAB — C-REACTIVE PROTEIN: CRP: 35 mg/L — ABNORMAL HIGH (ref 0–10)

## 2023-05-14 ENCOUNTER — Ambulatory Visit: Payer: Medicare HMO | Attending: Nurse Practitioner

## 2023-05-14 DIAGNOSIS — R0609 Other forms of dyspnea: Secondary | ICD-10-CM | POA: Diagnosis not present

## 2023-05-15 LAB — ECHOCARDIOGRAM LIMITED
Area-P 1/2: 3.63 cm2
Calc EF: 57.5 %
S' Lateral: 3.5 cm
Single Plane A2C EF: 56.1 %
Single Plane A4C EF: 55.8 %

## 2023-05-17 ENCOUNTER — Telehealth: Payer: Self-pay

## 2023-05-17 ENCOUNTER — Ambulatory Visit: Payer: Self-pay | Admitting: Internal Medicine

## 2023-05-17 NOTE — Telephone Encounter (Signed)
 Copied from CRM [id number]. Topic: Clinical - Medical Advice >> May 17, 2023  1:08 PM Pinkey ORN wrote: Reason for CRM: Requesting A Call Back >> May 17, 2023  1:10 PM Pinkey ORN wrote: Grant Bowers Wife [phone] is calling on behalf of the patient, states a letter was sent to the office from [facility] and she's requesting an update as far as has it been received and what's the next step as far as patient's care.

## 2023-05-17 NOTE — Telephone Encounter (Signed)
 Copied from CRM [id number]. Topic: Clinical - Red Word Triage >> May 17, 2023  3:31 PM China J wrote: [facility] that prompted transfer to Nurse Triage: Carditis. Patient having extreme discomfort and pain, low blood pressure yesterday at the chiropractor.  Chief Complaint: pain Symptoms: reports above 10/10 low back pain that radiates down right leg, pain limits all movements, weak all over Frequency: x  approx 3 days ago Pertinent Negatives: Patient denies SOB Disposition: [x] ED /[] Urgent Care (no appt availability in office) / [] Appointment(In office/virtual)/ []  [facility]/ [] Home Care/ [] Refused Recommended Disposition /[] [facility]/ []  Follow-up with PCP Additional Notes: blacked out and fell yesterday . Taking Tylenol  and ASA for pain without relief. low BP yesterday but normal today.  Wife states husband screams, cries and states even the bed covers hurt him. Referred pt to ED: wife verbalized understanding and stated will take him now.  Reason for Disposition  [1] SEVERE pain (e.g., excruciating, unable to do any normal activities) AND [2] not improved after 2 hours of pain medicine  Answer Assessment - Initial Assessment Questions 1. ONSET: When did the pain start?      Approx 3 day  2. LOCATION: Where is the pain located?      Low back area then into hip to Right leg related spicata 3. PAIN: How bad is the pain?    (Scale 1-10; or mild, moderate, severe)   -  MILD (1-3): doesn't interfere with normal activities    -  MODERATE (4-7): interferes with normal activities (e.g., work or school) or awakens from sleep, limping    -  SEVERE (8-10): excruciating pain, unable to do any normal activities, unable to walk     Above 10/10  4. WORK OR EXERCISE: Has there been any recent work or exercise that involved this part of the body?      Worse pain he can imagine with movement, screams, cries, bed covers hurt pt 5. CAUSE: What do you think is causing the leg  pain?     unknown 6. OTHER SYMPTOMS: Do you have any other symptoms? (e.g., chest pain, back pain, breathing difficulty, swelling, rash, fever, numbness, weakness)     Back pain, no difficulty breathing at present time weakness all over related to pericarditis and being treated for this 7. PREGNANCY: Is there any chance you are pregnant? When was your last menstrual period?     N/a  Protocols used: Leg Pain-A-AH

## 2023-05-17 NOTE — Telephone Encounter (Signed)
 Sorry I dont see where I have sent any letter.  Perhaps Ms Bouknight could check with the sender of the letter, to that specific provider.  thanks

## 2023-05-18 ENCOUNTER — Encounter: Payer: Self-pay | Admitting: Urology

## 2023-05-18 ENCOUNTER — Encounter (INDEPENDENT_AMBULATORY_CARE_PROVIDER_SITE_OTHER): Payer: Medicare HMO | Admitting: Ophthalmology

## 2023-05-18 DIAGNOSIS — H25813 Combined forms of age-related cataract, bilateral: Secondary | ICD-10-CM

## 2023-05-18 DIAGNOSIS — E119 Type 2 diabetes mellitus without complications: Secondary | ICD-10-CM

## 2023-05-18 DIAGNOSIS — S134XXA Sprain of ligaments of cervical spine, initial encounter: Secondary | ICD-10-CM | POA: Diagnosis not present

## 2023-05-18 DIAGNOSIS — M5441 Lumbago with sciatica, right side: Secondary | ICD-10-CM | POA: Diagnosis not present

## 2023-05-18 DIAGNOSIS — H43819 Vitreous degeneration, unspecified eye: Secondary | ICD-10-CM

## 2023-05-18 DIAGNOSIS — M9902 Segmental and somatic dysfunction of thoracic region: Secondary | ICD-10-CM | POA: Diagnosis not present

## 2023-05-18 DIAGNOSIS — I10 Essential (primary) hypertension: Secondary | ICD-10-CM

## 2023-05-18 DIAGNOSIS — M9903 Segmental and somatic dysfunction of lumbar region: Secondary | ICD-10-CM | POA: Diagnosis not present

## 2023-05-18 DIAGNOSIS — M9901 Segmental and somatic dysfunction of cervical region: Secondary | ICD-10-CM | POA: Diagnosis not present

## 2023-05-18 DIAGNOSIS — H35033 Hypertensive retinopathy, bilateral: Secondary | ICD-10-CM

## 2023-05-18 DIAGNOSIS — M47816 Spondylosis without myelopathy or radiculopathy, lumbar region: Secondary | ICD-10-CM | POA: Diagnosis not present

## 2023-05-18 DIAGNOSIS — S233XXA Sprain of ligaments of thoracic spine, initial encounter: Secondary | ICD-10-CM | POA: Diagnosis not present

## 2023-05-18 NOTE — Telephone Encounter (Signed)
 Called and spoke with Pt wife and she states the situation has been taken care of.

## 2023-05-19 DIAGNOSIS — M5441 Lumbago with sciatica, right side: Secondary | ICD-10-CM | POA: Diagnosis not present

## 2023-05-19 DIAGNOSIS — M545 Low back pain, unspecified: Secondary | ICD-10-CM | POA: Diagnosis not present

## 2023-05-19 DIAGNOSIS — Z743 Need for continuous supervision: Secondary | ICD-10-CM | POA: Diagnosis not present

## 2023-05-19 DIAGNOSIS — M47816 Spondylosis without myelopathy or radiculopathy, lumbar region: Secondary | ICD-10-CM | POA: Diagnosis not present

## 2023-05-21 ENCOUNTER — Ambulatory Visit: Payer: Medicare HMO | Attending: Internal Medicine | Admitting: Internal Medicine

## 2023-05-21 ENCOUNTER — Ambulatory Visit: Payer: Medicare HMO | Admitting: "Endocrinology

## 2023-05-21 ENCOUNTER — Encounter: Payer: Self-pay | Admitting: Internal Medicine

## 2023-05-21 ENCOUNTER — Encounter: Payer: Self-pay | Admitting: "Endocrinology

## 2023-05-21 VITALS — BP 118/64 | HR 92 | Ht 72.0 in | Wt 184.8 lb

## 2023-05-21 VITALS — BP 122/60 | HR 104 | Ht 72.0 in | Wt 185.0 lb

## 2023-05-21 DIAGNOSIS — R000 Tachycardia, unspecified: Secondary | ICD-10-CM | POA: Insufficient documentation

## 2023-05-21 DIAGNOSIS — M47816 Spondylosis without myelopathy or radiculopathy, lumbar region: Secondary | ICD-10-CM | POA: Diagnosis not present

## 2023-05-21 DIAGNOSIS — Z7984 Long term (current) use of oral hypoglycemic drugs: Secondary | ICD-10-CM | POA: Diagnosis not present

## 2023-05-21 DIAGNOSIS — G9331 Postviral fatigue syndrome: Secondary | ICD-10-CM | POA: Insufficient documentation

## 2023-05-21 DIAGNOSIS — M9903 Segmental and somatic dysfunction of lumbar region: Secondary | ICD-10-CM | POA: Diagnosis not present

## 2023-05-21 DIAGNOSIS — I10 Essential (primary) hypertension: Secondary | ICD-10-CM

## 2023-05-21 DIAGNOSIS — E1165 Type 2 diabetes mellitus with hyperglycemia: Secondary | ICD-10-CM

## 2023-05-21 DIAGNOSIS — E782 Mixed hyperlipidemia: Secondary | ICD-10-CM

## 2023-05-21 DIAGNOSIS — M9901 Segmental and somatic dysfunction of cervical region: Secondary | ICD-10-CM | POA: Diagnosis not present

## 2023-05-21 DIAGNOSIS — S134XXA Sprain of ligaments of cervical spine, initial encounter: Secondary | ICD-10-CM | POA: Diagnosis not present

## 2023-05-21 DIAGNOSIS — M5441 Lumbago with sciatica, right side: Secondary | ICD-10-CM | POA: Diagnosis not present

## 2023-05-21 DIAGNOSIS — M9902 Segmental and somatic dysfunction of thoracic region: Secondary | ICD-10-CM | POA: Diagnosis not present

## 2023-05-21 DIAGNOSIS — S233XXA Sprain of ligaments of thoracic spine, initial encounter: Secondary | ICD-10-CM | POA: Diagnosis not present

## 2023-05-21 NOTE — Progress Notes (Signed)
 Cardiology Office Note  Date: 05/21/2023   ID: Andree Ugalde, DOB 1946/02/08, MRN [id number]  PCP:  Roslyn Coombe, MD  Cardiologist:  Maudine Sos, MD Electrophysiologist:  Efraim Grange, MD   History of Present Illness: Grant Bowers is a 78 y.o. male known to have symptomatic trifascicular block s/p PPM, HTN, DM 2, OSA on CPAP, GERD is here for follow-up visit.  Patient had viral illness in November 2024 after which he continued to have SOB and fatigue.  This was thought to be postviral syndrome but due to elevation of inflammatory markers, he was started on high-dose aspirin  and colchicine  with improvement in his symptoms.  Limited echocardiogram showed normal LVEF and small pericardial effusion, circumferential.  He never had any chest pain.  Currently, denies having any SOB, orthopnea or PND.  No dizziness, syncope or palpitations.  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   [facility];   followed by EP cardiology--- dr Rodolfo Clan   Diabetes mellitus without complication (HCC)    Dyspnea due to COVID-19    with exertion   ED (erectile dysfunction)    GERD (gastroesophageal reflux disease)    History of 2019 novel coronavirus disease (COVID-19) 05/07/2020   per pt mild symptoms all resolved with exception DOE   History of cardiac murmur as a child    History of kidney stones    Hyperlipidemia    Hypertension    followed by pcp and cardiology   Hypogonadism male    Mild atherosclerosis of both carotid arteries 09/23/2015   duplex doppler in epic bilateral ICA 1-39%   OSA on CPAP    per pt uses nightly   Post-COVID chronic dyspnea    had covid 05-07-2020;  per pt never had issue prior to covid ;  PFT 10-19-2020 normal per dr c. young   Right ureteral calculus 02/11/2010   Skin cancer    Calf   Trifascicular block    alternating with BBB per dr  Rodolfo Clan note;  s/p PPM 07-09-2020;   echo 07-29-2020 mild LVH, G1DD, ef 65-70%, moderate LAE, mild MR without stenosis    Past Surgical History:  Procedure Laterality Date   CARDIAC CATHETERIZATION  2009   per pt in [place] , told was normal   CARDIAC CATHETERIZATION  02/17/2010   @[facility]   by dr Abel Hoe--- no angiographic evidence cad, normal lvsf,  false positive stress test   COLONOSCOPY  12/07/2006   [facility] in [place]    CYSTOSCOPY WITH RETROGRADE PYELOGRAM, URETEROSCOPY AND STENT PLACEMENT Right 10/29/2020   Procedure: CYSTOSCOPY WITH RETROGRADE PYELOGRAM, URETEROSCOPY AND STENT PLACEMENT;  Surgeon: Trent Frizzle, MD;  Location: [facility];  Service: Urology;  Laterality: Right;   CYSTOSCOPY WITH RETROGRADE PYELOGRAM, URETEROSCOPY AND STENT PLACEMENT Right 11/09/2020   Procedure: CYSTOSCOPY WITH RIGHT  RETROGRADE PYELOGRAM, URETEROSCOPY AND STENT EXHANGE;  Surgeon: Sherlyn Ditto, MD;  Location: [facility];  Service: Urology;  Laterality: Right;  30 MINS   CYSTOSCOPY WITH URETEROSCOPY, STONE BASKETRY AND STENT PLACEMENT  2012   CYSTOSCOPY/URETEROSCOPY/HOLMIUM LASER/STENT PLACEMENT Right 12/20/2021   Procedure: CYSTOSCOPY/URETEROSCOPY/HOLMIUM LASER/STENT PLACEMENT;  Surgeon: Sherlyn Ditto, MD;  Location: [facility];  Service: Urology;  Laterality: Right;  45 MINS   EXTRACORPOREAL SHOCK WAVE LITHOTRIPSY Right 04/29/2020   Procedure: EXTRACORPOREAL SHOCK WAVE LITHOTRIPSY (ESWL);  Surgeon: Roxane Copp, MD;  Location:   SURGERY CENTER;  Service: Urology;  Laterality: Right;   HOLMIUM LASER APPLICATION Right 11/09/2020   Procedure: HOLMIUM LASER APPLICATION;  Surgeon: Sherlyn Ditto, MD;  Location: [facility];  Service: Urology;  Laterality: Right;   KNEE ARTHROSCOPY Left 2008   PACEMAKER IMPLANT N/A 07/09/2020   Procedure: PACEMAKER IMPLANT;  Surgeon: Verona Goodwill, MD;  Location: [facility];  Service: Cardiovascular;  Laterality: N/A;     Current Outpatient Medications  Medication Sig Dispense Refill   acetaminophen  (TYLENOL ) 325 MG tablet Take 650 mg by mouth every 6 (six) hours as needed for fever.     Alcohol Swabs  (B-D SINGLE USE SWABS  REGULAR) PADS Use as directed once per day E11.9 100 each 12   alfuzosin  (UROXATRAL ) 10 MG 24 hr tablet Take 10 mg by mouth at bedtime.     amLODipine  (NORVASC ) 10 MG tablet TAKE 1 TABLET EVERY DAY ANNUAL APPT DUE IN JANUARY MUST SEE PROVIDER FOR FUTURE REFILLS 90 tablet 3   aspirin  EC 325 MG tablet Take 2 tablets (650 mg total) by mouth in the morning and at bedtime. 120 tablet 0   Blood Glucose Calibration (TRUE METRIX LEVEL 1) Low SOLN Use as directed once per day E11.9 1 each 1   Blood Glucose Monitoring Suppl (TRUE METRIX METER) DEVI USE AS DIRECTED TO test blood sugar EVERY DAY     cetirizine  (ZYRTEC ) 10 MG tablet TAKE 1 TABLET EVERY DAY 90 tablet 3   Cholecalciferol (VITAMIN D3 SUPER STRENGTH) 50 MCG (2000 UT) TABS Take 2,000 Units by mouth daily.     colchicine  0.6 MG tablet Take 1 tablet (0.6 mg total) by mouth 2 (two) times daily. 60 tablet 0   glucose blood (TRUE METRIX BLOOD GLUCOSE TEST) test strip TEST BLOOD SUGAR TWO TIMES DAILY AS INSTRUCTED 200 strip 3   hydrochlorothiazide  (MICROZIDE ) 12.5 MG capsule TAKE 1 CAPSULE EVERY DAY 90 capsule 3   irbesartan  (AVAPRO ) 300 MG tablet TAKE 1 TABLET AT BEDTIME. ANNUAL APPT DUE IN [place]. MUST SEE PROVIDER FOR FUTURE REFILLS 90 tablet 3   metFORMIN  (GLUCOPHAGE -XR) 500 MG 24 hr tablet TAKE 1 TABLET EVERY DAY WITH BREAKFAST 90 tablet 3   omeprazole  (PRILOSEC) 10 MG capsule TAKE 1 CAPSULE EVERY DAY ANNUAL APPT DUE IN JANUARY MUST SEE PROVIDER FOR FUTURE REFILLS 90 capsule 3   predniSONE  (DELTASONE ) 20 MG tablet Take 40 mg by mouth daily.     rosuvastatin  (CRESTOR ) 20 MG tablet TAKE 1 TABLET EVERY DAY 90 tablet 3   Semaglutide  (RYBELSUS ) 3 MG TABS Take by mouth.     tolterodine (DETROL LA) 2 MG 24 hr capsule Take 2 mg by mouth daily.      TRUEplus Lancets 33G MISC Use as directed once per day E11.9 100 each 12   zinc gluconate 50 MG tablet Take 50 mg by mouth daily.     Chlorpheniramine-DM (CORICIDIN HBP COUGH/COLD PO) Take 1 tablet by mouth daily as needed (cough/cold). (Patient not taking: Reported on 05/21/2023)     No current facility-administered medications for this visit.   Allergies:  Patient has no known allergies.   Social History: The patient  reports that he has never smoked. He has never used smokeless tobacco. He reports that he does not currently use alcohol. He reports that he does not use drugs.   Family History: The patient's family history includes Colon polyps in his brother; Diabetes in his brother and father; Pancreatic cancer in his mother.  ROS:  Please see the history of present illness. Otherwise, complete review of systems is positive for none.  All other systems are reviewed and negative.   Physical Exam: VS:  BP 122/60 (BP Location: Right Arm, Cuff Size: Normal)   Pulse (!) 104   Ht 6' (1.829 m)   Wt 185 lb (83.9 kg)   SpO2 95%   BMI 25.09 kg/m , BMI Body mass index is 25.09 kg/m.  Wt Readings from Last 3 Encounters:  05/21/23 185 lb (83.9 kg)  05/03/23 192 lb 9.6 oz (87.4 kg)  04/26/23 200 lb (90.7 kg)    General: Patient appears comfortable at rest. HEENT: Conjunctiva and lids normal, oropharynx clear with moist mucosa. Neck: Supple, no elevated JVP or carotid bruits, no thyromegaly. Lungs: Clear to auscultation, nonlabored breathing at rest. Cardiac: Regular rate and rhythm, no S3 or significant systolic murmur, no pericardial rub. Abdomen: Soft, nontender, no hepatomegaly, bowel sounds present, no guarding or rebound. Extremities: No pitting edema, distal pulses 2+. Skin: Warm and dry. Musculoskeletal: No kyphosis. Neuropsychiatric: Alert and oriented x3, affect grossly appropriate.  Recent Labwork: 04/26/2023: B Natriuretic Peptide 80.0; Hemoglobin 10.8; Platelets  292 05/03/2023: ALT 16; AST 20; BUN 20; Creatinine, Ser 1.52; Potassium 4.5; Sodium 138; TSH 2.600     Component Value Date/Time   CHOL 96 (L) 05/03/2023 1115   TRIG 82 05/03/2023 1115   HDL 30 (L) 05/03/2023 1115   CHOLHDL 3.2 05/03/2023 1115   CHOLHDL 4 04/17/2023 1353   VLDL 25.0 04/17/2023 1353   LDLCALC 49 05/03/2023 1115   LDLCALC 57 11/06/2019 1157   LDLDIRECT 150.3 02/11/2010 1211     Assessment and Plan:  Presumed pericarditis: He denied having any angina ever. Due to continued SOB and fatigue after viral illness in November 2024, he was started on high-dose aspirin  and colchicine  for the management of pericarditis. The frequency of high-dose aspirin  was recently increased a few days ago from twice daily to 3 times daily.  He reported improvement in his symptoms for the last couple of weeks. Limited echocardiogram showed normal LVEF and small circumferential pericardial effusion. ESR and CRP were obtained in the last week of January 2025, ESR was normal and CRP was mildly elevated, 35.  This finding of isolated elevated CRP could be nonspecific in the absence of chest pain.  His symptoms of SOB and fatigue go very well with postviral syndrome as he also developed new onset of sudden depression after his viral illness. I will wean off high-dose aspirin  and discontinue colchicine .  If he starts to develop new symptoms of DOE, he will benefit from NM stress test or CT cardiac to rule out CAD.  PFTs in 2022 were within normal limits.  Postviral syndrome management per PCP.  Sinus tachycardia: He has back pain and bilateral hip pain.  He sees a [facility].  During my interview, he is in extreme pain, 8/10 in intensity as per his back and hips.  Sinus tachycardia likely secondary to pain.  Control pain.  Take Tylenol  as needed for pain.  Currently he is on p.o. prednisone  for the management of bilateral hip pain.  HTN, controlled: Continue amlodipine  10 mg once daily, HCTZ 12.5 mg once  daily and irbesartan  300 mg once daily.  DM2: Follows with endocrinology.  On metformin .  Used to take semaglutide  in the past.  He lost 10 pounds of weight recently in the last 1 month.   Medication Adjustments/Labs and Tests Ordered: Current medicines are reviewed at  length with the patient today.  Concerns regarding medicines are outlined above.    Disposition:  Follow up 6 months  Signed, Grant Pinkard Beauford Bounds, MD, 05/21/2023 11:21 AM    [facility] [address] [place], [place] [address]

## 2023-05-21 NOTE — Patient Instructions (Addendum)
 Medication Instructions:    Your physician has recommended you make the following change in your medication:   Decrease Aspirin  to 325 mg Two Times Daily for two days, then 325 mg daily for two days then STOP.   Stop Take Colchicine    *If you need a refill on your cardiac medications before your next appointment, please call your pharmacy*   Lab Work: NONE  If you have labs (blood work) drawn today and your tests are completely normal, you will receive your results only by: MyChart Message (if you have MyChart) OR A paper copy in the mail If you have any lab test that is abnormal or we need to change your treatment, we will call you to review the results.   Testing/Procedures: NONE    Follow-Up: At [facility], you and your health needs are our priority.  As part of our continuing mission to provide you with exceptional heart care, we have created designated Provider Care Teams.  These Care Teams include your primary Cardiologist (physician) and Advanced Practice Providers (APPs -  Physician Assistants and Nurse Practitioners) who all work together to provide you with the care you need, when you need it.  We recommend signing up for the patient portal called "MyChart".  Sign up information is provided on this After Visit Summary.  MyChart is used to connect with patients for Virtual Visits (Telemedicine).  Patients are able to view lab/test results, encounter notes, upcoming appointments, etc.  Non-urgent messages can be sent to your provider as well.   To learn more about what you can do with MyChart, go to [email or web address].    Your next appointment:   6 month(s)  Provider:   You may see Vishnu P Mallipeddi, MD or one of the following Advanced Practice Providers on your designated Care Team:   Woodfin Hays, PA-C  Theotis Flake, PA-C     Other Instructions Thank you for choosing [facility]!

## 2023-05-21 NOTE — Progress Notes (Signed)
 Endocrinology Consult Note       05/21/2023, 2:19 PM   Subjective:    Patient ID: Grant Bowers, male    DOB: [birth year].  Grant Bowers is being seen in consultation for management of currently uncontrolled symptomatic diabetes requested by  Roslyn Coombe, MD.   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   [facility];   followed by EP cardiology--- dr Rodolfo Clan   Diabetes mellitus without complication (HCC)    Dyspnea due to COVID-19    with exertion   ED (erectile dysfunction)    GERD (gastroesophageal reflux disease)    History of 2019 novel coronavirus disease (COVID-19) 05/07/2020   per pt mild symptoms all resolved with exception DOE   History of cardiac murmur as a child    History of kidney stones    Hyperlipidemia    Hypertension    followed by pcp and cardiology   Hypogonadism male    Mild atherosclerosis of both carotid arteries 09/23/2015   duplex doppler in epic bilateral ICA 1-39%   OSA on CPAP    per pt uses nightly   Post-COVID chronic dyspnea    had covid 05-07-2020;  per pt never had issue prior to covid ;  PFT 10-19-2020 normal per dr c. young   Right ureteral calculus 02/11/2010   Skin cancer    Calf   Trifascicular block    alternating with BBB per dr Rodolfo Clan note;  s/p PPM 07-09-2020;   echo 07-29-2020 mild LVH, G1DD, ef 65-70%, moderate LAE, mild MR without stenosis    Past Surgical History:  Procedure Laterality Date   CARDIAC CATHETERIZATION  2009   per pt in [place] , told was normal   CARDIAC CATHETERIZATION  02/17/2010   @[facility]   by dr Abel Hoe--- no angiographic evidence cad, normal lvsf,  false positive stress test   COLONOSCOPY  12/07/2006   [facility] in [place]    CYSTOSCOPY WITH RETROGRADE PYELOGRAM, URETEROSCOPY AND STENT PLACEMENT Right 10/29/2020    Procedure: CYSTOSCOPY WITH RETROGRADE PYELOGRAM, URETEROSCOPY AND STENT PLACEMENT;  Surgeon: Trent Frizzle, MD;  Location: [facility];  Service: Urology;  Laterality: Right;   CYSTOSCOPY WITH RETROGRADE PYELOGRAM, URETEROSCOPY AND STENT PLACEMENT Right 11/09/2020   Procedure: CYSTOSCOPY WITH RIGHT  RETROGRADE PYELOGRAM, URETEROSCOPY AND STENT EXHANGE;  Surgeon: Sherlyn Ditto, MD;  Location: [facility];  Service: Urology;  Laterality: Right;  30 MINS   CYSTOSCOPY WITH URETEROSCOPY, STONE BASKETRY AND STENT PLACEMENT  2012   CYSTOSCOPY/URETEROSCOPY/HOLMIUM LASER/STENT PLACEMENT Right 12/20/2021   Procedure: CYSTOSCOPY/URETEROSCOPY/HOLMIUM LASER/STENT PLACEMENT;  Surgeon: Sherlyn Ditto, MD;  Location: [facility];  Service: Urology;  Laterality: Right;  45 MINS   EXTRACORPOREAL SHOCK WAVE LITHOTRIPSY Right 04/29/2020   Procedure: EXTRACORPOREAL SHOCK WAVE LITHOTRIPSY (ESWL);  Surgeon: Roxane Copp, MD;  Location[facility];  Service: Urology;  Laterality: Right;   HOLMIUM LASER APPLICATION Right 11/09/2020   Procedure: HOLMIUM LASER APPLICATION;  Surgeon: Sherlyn Ditto, MD;  Location: [facility];  Service: Urology;  Laterality: Right;   KNEE ARTHROSCOPY Left 2008   PACEMAKER IMPLANT N/A 07/09/2020   Procedure: PACEMAKER IMPLANT;  Surgeon: Verona Goodwill, MD;  Location: [facility];  Service: Cardiovascular;  Laterality: N/A;    Social History   Socioeconomic History   Marital status: Married    Spouse name: Not on file   Number of children: Not on file   Years of education: Not on file   Highest education level: Not on file  Occupational History   Not on file  Tobacco Use   Smoking status: Never   Smokeless tobacco: Never  Vaping Use   Vaping status: Never Used  Substance and Sexual Activity   Alcohol use: Not Currently    Comment: very rare    Drug use: Never   Sexual activity: Not on file  Other Topics Concern    Not on file  Social History Narrative   Live with wife; one grown in [place]   2 grand children   One level home   retired   [employer] Strain: Not on [employer] Insecurity: Not on file  Transportation Needs: Not on file  Physical Activity: Not on file  Stress: Not on file  Social Connections: Not on file    Family History  Problem Relation Age of Onset   Pancreatic cancer Mother    Diabetes Father    Diabetes Brother    Colon polyps Brother    Colon cancer Neg Hx    Esophageal cancer Neg Hx    Rectal cancer Neg Hx    Stomach cancer Neg Hx     Outpatient Encounter Medications as of 05/21/2023  Medication Sig   acetaminophen  (TYLENOL ) 325 MG tablet Take 650 mg by mouth every 6 (six) hours as needed for fever.   Alcohol Swabs  (B-D SINGLE USE SWABS  REGULAR) PADS Use as directed once per day E11.9   alfuzosin  (UROXATRAL ) 10 MG 24 hr tablet Take 10 mg by mouth at bedtime.   amLODipine  (NORVASC ) 10 MG tablet TAKE 1 TABLET EVERY DAY ANNUAL APPT DUE IN JANUARY MUST SEE PROVIDER FOR FUTURE REFILLS   aspirin  EC 325 MG tablet Take 2 tablets (650 mg total) by mouth in the morning and at bedtime.   Blood Glucose Calibration (TRUE METRIX LEVEL 1) Low SOLN Use as directed once per day E11.9   Blood Glucose Monitoring Suppl (TRUE METRIX METER) DEVI USE AS DIRECTED TO test blood sugar EVERY DAY   cetirizine  (ZYRTEC ) 10 MG tablet TAKE 1 TABLET EVERY DAY   Chlorpheniramine-DM (CORICIDIN HBP COUGH/COLD PO) Take 1 tablet by mouth daily as needed (cough/cold). (Patient not taking: Reported on 05/21/2023)   Cholecalciferol (VITAMIN D3 SUPER STRENGTH) 50 MCG (2000 UT) TABS Take 2,000 Units by mouth daily.   colchicine  0.6 MG tablet Take 1 tablet (0.6 mg total) by mouth 2 (two) times daily.   glucose blood (TRUE METRIX BLOOD GLUCOSE TEST) test strip TEST BLOOD SUGAR TWO TIMES DAILY AS INSTRUCTED   hydrochlorothiazide  (MICROZIDE ) 12.5 MG capsule TAKE 1 CAPSULE  EVERY DAY   irbesartan  (AVAPRO ) 300 MG tablet TAKE 1 TABLET AT BEDTIME. ANNUAL APPT DUE IN [place]. MUST SEE PROVIDER FOR FUTURE REFILLS   metFORMIN  (GLUCOPHAGE -XR) 500 MG 24 hr tablet TAKE 1 TABLET EVERY DAY WITH BREAKFAST   omeprazole  (PRILOSEC) 10 MG capsule TAKE 1 CAPSULE EVERY DAY ANNUAL APPT DUE IN [place] MUST SEE PROVIDER FOR FUTURE REFILLS  predniSONE  (DELTASONE ) 20 MG tablet Take 40 mg by mouth daily.   rosuvastatin  (CRESTOR ) 20 MG tablet TAKE 1 TABLET EVERY DAY   tolterodine (DETROL LA) 2 MG 24 hr capsule Take 2 mg by mouth daily.   TRUEplus Lancets 33G MISC Use as directed once per day E11.9   zinc gluconate 50 MG tablet Take 50 mg by mouth daily.   [DISCONTINUED] Semaglutide  (RYBELSUS ) 3 MG TABS Take by mouth. (Patient not taking: Reported on 05/21/2023)   No facility-administered encounter medications on file as of 05/21/2023.    ALLERGIES: No Known Allergies  VACCINATION STATUS: Immunization History  Administered Date(s) Administered   Fluad Quad(high Dose 65+) 02/28/2023   Influenza Split 03/13/2011   Influenza Whole 02/11/2008, 01/08/2009, 02/11/2010   Influenza, High Dose Seasonal PF 01/10/2019, 03/31/2022   Influenza-Unspecified 02/01/2018, 03/22/2020, 03/14/2021   Moderna Sars-Covid-2 Vaccination 07/10/2019, 08/09/2019, 10/20/2019, 04/12/2020   Pneumococcal Conjugate-13 09/02/2013   Pneumococcal Polysaccharide-23 09/17/2014   Td 04/10/2000, 01/09/2007   Tdap 10/31/2018   Zoster Recombinant(Shingrix) 03/31/2022   Zoster, Live 04/08/2008    Diabetes He presents for his follow-up diabetic visit. He has type 2 diabetes mellitus. Onset time: He was diagnosed at approximate age of 53 years. His disease course has been improving. There are no hypoglycemic associated symptoms. Pertinent negatives for hypoglycemia include no headaches, seizures or tremors. There are no diabetic associated symptoms. Pertinent negatives for diabetes include no chest pain and no polydipsia.  There are no hypoglycemic complications. Symptoms are improving. Diabetic complications include heart disease. Risk factors for coronary artery disease include dyslipidemia, diabetes mellitus, hypertension and family history. Current diabetic treatments: He is currently on metformin  500 mg p.o. once a day. His weight is decreasing steadily. He is following a generally unhealthy diet. When asked about meal planning, he reported none. He has not had a previous visit with a dietitian. He participates in exercise intermittently. His home blood glucose trend is decreasing steadily. His breakfast blood glucose range is generally 140-180 mg/dl. His bedtime blood glucose range is generally 140-180 mg/dl. His overall blood glucose range is 140-180 mg/dl. (He brings his meter showing near target glycemic profile except for most recent 2 days where he showed hyperglycemia due to steroid intervention given to him to treat sciatica.  His previsit A1c was 6.2% improving from 6.5% during his last visit.   ) An ACE inhibitor/angiotensin II receptor blocker is being taken. Eye exam is current.  Hyperlipidemia This is a chronic problem. The current episode started more than 1 year ago. The problem is uncontrolled. Exacerbating diseases include diabetes. Pertinent negatives include no chest pain, myalgias or shortness of breath. Risk factors for coronary artery disease include dyslipidemia, diabetes mellitus, hypertension, male sex and family history.  Hypertension This is a chronic problem. The current episode started more than 1 year ago. The problem is controlled. Pertinent negatives include no chest pain, headaches, palpitations or shortness of breath. Risk factors for coronary artery disease include dyslipidemia, diabetes mellitus, family history and male gender. Past treatments include angiotensin blockers.     Review of Systems  Constitutional:  Negative for chills and fever.  Respiratory:  Negative for cough and  shortness of breath.   Cardiovascular:  Negative for chest pain and palpitations.       No Shortness of breath  Gastrointestinal:  Negative for abdominal pain, diarrhea, nausea and vomiting.  Endocrine: Negative for polydipsia.  Genitourinary:  Negative for frequency, hematuria and urgency.  Musculoskeletal:  Negative for myalgias.  Skin:  Negative for rash.  Neurological:  Negative for tremors, seizures and headaches.  Hematological:  Does not bruise/bleed easily.  Psychiatric/Behavioral:  Negative for hallucinations and suicidal ideas.     Objective:       05/21/2023    1:20 PM 05/21/2023   11:09 AM 05/03/2023    2:07 PM  Vitals with BMI  Height 6\' 0"  6\' 0"  6\' 0"   Weight 184 lbs 13 oz 185 lbs 192 lbs 10 oz  BMI 25.06 25.08 26.12  Systolic 118 122 960  Diastolic 64 60 70  Pulse 92 104 111    BP 118/64   Pulse 92   Ht 6' (1.829 m)   Wt 184 lb 12.8 oz (83.8 kg)   BMI 25.06 kg/m   Wt Readings from Last 3 Encounters:  05/21/23 184 lb 12.8 oz (83.8 kg)  05/21/23 185 lb (83.9 kg)  05/03/23 192 lb 9.6 oz (87.4 kg)      CMP ( most recent) CMP     Component Value Date/Time   NA 138 05/03/2023 1115   K 4.5 05/03/2023 1115   CL 101 05/03/2023 1115   CO2 21 05/03/2023 1115   GLUCOSE 180 (H) 05/03/2023 1115   GLUCOSE 139 (H) 04/26/2023 1444   BUN 20 05/03/2023 1115   CREATININE 1.52 (H) 05/03/2023 1115   CREATININE 1.23 (H) 11/06/2019 1157   CALCIUM  9.2 05/03/2023 1115   PROT 6.0 05/03/2023 1115   ALBUMIN 3.5 (L) 05/03/2023 1115   AST 20 05/03/2023 1115   ALT 16 05/03/2023 1115   ALKPHOS 91 05/03/2023 1115   BILITOT 0.5 05/03/2023 1115   GFR 55.79 (L) 04/17/2023 1353   EGFR 47 (L) 05/03/2023 1115   GFRNONAA 52 (L) 04/26/2023 1444   GFRNONAA 57 (L) 11/06/2019 1157     Diabetic Labs (most recent): Lab Results  Component Value Date   HGBA1C 6.2 04/17/2023   HGBA1C 6.5 01/02/2023   HGBA1C 6.0 04/25/2022   MICROALBUR 36.0 (H) 04/17/2023   MICROALBUR 41.3 (H)  04/25/2022     Lipid Panel ( most recent) Lipid Panel     Component Value Date/Time   CHOL 96 (L) 05/03/2023 1115   TRIG 82 05/03/2023 1115   HDL 30 (L) 05/03/2023 1115   CHOLHDL 3.2 05/03/2023 1115   CHOLHDL 4 04/17/2023 1353   VLDL 25.0 04/17/2023 1353   LDLCALC 49 05/03/2023 1115   LDLCALC 57 11/06/2019 1157   LDLDIRECT 150.3 02/11/2010 1211   LABVLDL 17 05/03/2023 1115      Lab Results  Component Value Date   TSH 2.600 05/03/2023   TSH 1.22 04/17/2023   TSH 2.16 04/25/2022   TSH 1.59 03/15/2021   TSH 1.68 11/16/2020   TSH 2.43 06/01/2020   TSH 1.07 11/06/2019   TSH 1.18 10/31/2018   TSH 1.43 08/14/2017   TSH 1.48 08/08/2016   FREET4 1.30 05/03/2023     Lipid Panel     Component Value Date/Time   CHOL 96 (L) 05/03/2023 1115   TRIG 82 05/03/2023 1115   HDL 30 (L) 05/03/2023 1115   CHOLHDL 3.2 05/03/2023 1115   CHOLHDL 4 04/17/2023 1353   VLDL 25.0 04/17/2023 1353   LDLCALC 49 05/03/2023 1115   LDLCALC 57 11/06/2019 1157   LDLDIRECT 150.3 02/11/2010 1211   LABVLDL 17 05/03/2023 1115     Assessment & Plan:   1. Type 2 diabetes mellitus with hyperglycemia, without long-term current use of insulin  [facility])   He brings his meter showing near target  glycemic profile except for most recent 2 days where he showed hyperglycemia due to steroid intervention given to him to treat sciatica.  His previsit A1c was 6.2% improving from 6.5% during his last visit.   - I had a long discussion with him about the possible risk factors and  the pathology behind its diabetes and its complications. -his diabetes is complicated by comorbid hypertension/hyperlipidemia, sedentary life and he remains at a high risk for more acute and chronic complications which include CAD, CVA, CKD, retinopathy, and neuropathy. These are all discussed in detail with him.  - I discussed all available options of managing his diabetes including de-escalation of medications. I have counseled him on Food  as Medicine by adopting a Whole Food , Plant Predominant  ( WFPP) nutrition as recommended by [employer] of Lifestyle Medicine. Patient is encouraged to switch to  unprocessed or minimally processed  complex starch, adequate protein intake (mainly plant source), minimal liquid fat, plenty of fruits, and vegetables. -  he is advised to stick to a routine mealtimes to eat 3 complete meals a day and snack only when necessary ( to snack only to correct hypoglycemia BG <70 day time or <100 at night).   - he acknowledges that there is a room for improvement in his food and drink choices. - Further Specific Suggestion is made for him to avoid simple carbohydrates  from his diet including Cakes, Sweet Desserts, Ice Cream, Soda (diet and regular), Sweet Tea, Candies, Chips, Cookies, Store Bought Juices, Alcohol ,  Artificial Sweeteners,  Coffee Creamer, and "Sugar-free" Products. This will help patient to have more stable blood glucose profile and potentially avoid unintended weight gain.  - he will be scheduled with Penny Crumpton, RDN, CDE for individualized diabetes education.  - I have approached him with the following individualized plan to manage  his diabetes and patient agrees:   -He took himself off of Rybelsus .  He presents with unintended weight loss.  He is advised to stay off of Rybelsus  for now.  Metformin  500 mg XR p.o. daily at breakfast should be sufficient treatment to treat his diabetes for now given his recent A1c of 6.2%.  -He is willing and advised to monitor blood glucose twice a day-daily before breakfast and at bedtime. - he is encouraged to call clinic for blood glucose levels less than 70 or above 200 mg /dl. - Specific targets for  A1c;  LDL, HDL,  and Triglycerides were discussed with the patient.  2) Blood Pressure /Hypertension: His blood pressure is controlled to target.  he is advised to continue his current medications including irbesartan  300 mg p.o. daily with  breakfast . 3) Lipids/Hyperlipidemia:   Review of his recent lipid panel showed significantly improved LDL at 49 from 150.  He is advised to continue Crestor  20 mg p.o. nightly.    Side effects and precautions discussed with him.  4)  Weight/Diet:  Body mass index is 25.06 kg/m.     he is not a candidate for more weight loss.  The above detailed  ACLM recommendations for nutrition, exercise, sleep, social life, avoidance of risky substances, the need for restorative sleep   information will also detailed on discharge instructions.  5) Chronic Care/Health Maintenance:  -he  is on ACEI/ARB and Statin medications and  is encouraged to initiate and continue to follow up with Ophthalmology, Dentist,  Podiatrist at least yearly or according to recommendations, and advised to   stay away from smoking. I have  recommended yearly flu vaccine and pneumonia vaccine at least every 5 years; moderate intensity exercise for up to 150 minutes weekly; and  sleep for 7- 9 hours a day.  - he is  advised to maintain close follow up with Roslyn Coombe, MD for primary care needs, as well as his other providers for optimal and coordinated care.   I spent  26  minutes in the care of the patient today including review of labs from CMP, Lipids, Thyroid  Function, Hematology (current and previous including abstractions from other facilities); face-to-face time discussing  his blood glucose readings/logs, discussing hypoglycemia and hyperglycemia episodes and symptoms, medications doses, his options of short and long term treatment based on the latest standards of care / guidelines;  discussion about incorporating lifestyle medicine;  and documenting the encounter. Risk reduction counseling performed per USPSTF guidelines to reduce cardiovascular risk factors.     Please refer to Patient Instructions for Blood Glucose Monitoring and Insulin /Medications Dosing Guide"  in media tab for additional information. Please  also refer to  " Patient Self Inventory" in the Media  tab for reviewed elements of pertinent patient history.  Neila Bally participated in the discussions, expressed understanding, and voiced agreement with the above plans.  All questions were answered to his satisfaction. he is encouraged to contact clinic should he have any questions or concerns prior to his return visit.   Follow up plan: - Return in about 3 months (around 08/18/2023) for F/U with Pre-visit Labs, Meter/CGM/Logs, A1c here.  Kalvin Orf, MD [facility] [address] [place], [place] [address] Phone: [phone]  Fax: [phone]    05/21/2023, 2:19 PM  This note was partially dictated with voice recognition software. Similar sounding words can be transcribed inadequately or may not  be corrected upon review.

## 2023-05-21 NOTE — Addendum Note (Signed)
 Addended by: Omkar Stratmann G on: 05/21/2023 04:13 PM   Modules accepted: Orders

## 2023-05-22 ENCOUNTER — Other Ambulatory Visit: Payer: Self-pay | Admitting: Urology

## 2023-05-22 DIAGNOSIS — M47816 Spondylosis without myelopathy or radiculopathy, lumbar region: Secondary | ICD-10-CM | POA: Diagnosis not present

## 2023-05-22 DIAGNOSIS — M9901 Segmental and somatic dysfunction of cervical region: Secondary | ICD-10-CM | POA: Diagnosis not present

## 2023-05-22 DIAGNOSIS — M9902 Segmental and somatic dysfunction of thoracic region: Secondary | ICD-10-CM | POA: Diagnosis not present

## 2023-05-22 DIAGNOSIS — M5441 Lumbago with sciatica, right side: Secondary | ICD-10-CM | POA: Diagnosis not present

## 2023-05-22 DIAGNOSIS — S134XXA Sprain of ligaments of cervical spine, initial encounter: Secondary | ICD-10-CM | POA: Diagnosis not present

## 2023-05-22 DIAGNOSIS — S233XXA Sprain of ligaments of thoracic spine, initial encounter: Secondary | ICD-10-CM | POA: Diagnosis not present

## 2023-05-22 DIAGNOSIS — R972 Elevated prostate specific antigen [PSA]: Secondary | ICD-10-CM

## 2023-05-22 DIAGNOSIS — M9903 Segmental and somatic dysfunction of lumbar region: Secondary | ICD-10-CM | POA: Diagnosis not present

## 2023-05-22 NOTE — Progress Notes (Signed)
Remote pacemaker transmission.

## 2023-05-24 ENCOUNTER — Ambulatory Visit: Payer: Self-pay | Admitting: Internal Medicine

## 2023-05-24 NOTE — Telephone Encounter (Signed)
Chief Complaint: hip pain Symptoms: pain Frequency: constant   Disposition: [x] ED /[] Urgent Care (no appt availability in office) / [] Appointment(In office/virtual)/ []  [facility]/ [] Home Care/ [] Refused Recommended Disposition /[] [facility]/ []  Follow-up with PCP Additional Notes: Wife Grant Bowers called in with regards to bilateral hip pain on pt. Pain radiates to right left. 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 On 2/10, the provider ordered the medication of Colchicine to be stopped due to healing of heart. Wife/Pt not sure if related, but pt is in severe pain for last 5-6 days. Grant Bowers states hips are not discolored, swollen, or warm to touch. Pt screams out randomly and with any movement. EMS was called on Saturday and taken to ED. Pt was given flexeril and Prednisone. Grant Bowers states it helps some, but not enough. Pt can't get out of bed. Grant Bowers has to "Hold bucket while he goes to bathroom on side of bed." Grant Bowers is unable to move him to get him see PCP. Grant Bowers is requesting some sort of pain medication to be able to get him to appt. CAL has been notified. RN advised pt to go to ED at this time to be evaluated. Grant Bowers seemed to understand and acknowledged the importance of getting him seen.  Pt stated to Grant Bowers, "If I have to live like this for the rest of my life, its not worth living. I"m not going to hurt myself, but I can't do this." RN gave care advice and wife understands.           Copied from CRM [id number]. Topic: Clinical - Red Word Triage >> May 24, 2023 11:41 AM Grant Bowers wrote: [facility] that prompted transfer to Nurse Triage: pt wife request to speak with a nurse urgently, states pt  has painful back pt as a result of pericarditis. Unable to walk as a result . Reason for Disposition  [1] SEVERE pain (e.g., excruciating, unable to do any normal  activities) AND [2] fever  Answer Assessment - Initial Assessment Questions 1. LOCATION and RADIATION: "Where is the pain located?"      Both hips- radiates pain to right leg 2. QUALITY: "What does the pain feel like?"  (e.g., sharp, dull, aching, burning)     Throbbing pain/ sharp 3. SEVERITY: "How bad is the pain?" "What does it keep you from doing?"   (Scale 1-10; or mild, moderate, severe)   -  MILD (1-3): doesn't interfere with normal activities    -  MODERATE (4-7): interferes with normal activities (e.g., work or school) or awakens from sleep, limping    -  SEVERE (8-10): excruciating pain, unable to do any normal activities, unable to walk     Severe  4. ONSET: "When did the pain start?" "Does it come and go, or is it there all the time?"     5-6 days  5. WORK OR EXERCISE: "Has there been any recent work or exercise that involved this part of the body?"      No  6. CAUSE: "What do you think is causing the hip pain?"      Unsure  7. AGGRAVATING FACTORS: "What makes the hip pain worse?" (e.g., walking, climbing stairs, running)     Any movement  8. OTHER SYMPTOMS: "Do you have any other symptoms?" (e.g., back pain, pain shooting down leg,  fever,  rash)     Radiates to right leg  Protocols used: Hip Pain-A-AH

## 2023-05-25 DIAGNOSIS — M47816 Spondylosis without myelopathy or radiculopathy, lumbar region: Secondary | ICD-10-CM | POA: Diagnosis not present

## 2023-05-25 DIAGNOSIS — M9901 Segmental and somatic dysfunction of cervical region: Secondary | ICD-10-CM | POA: Diagnosis not present

## 2023-05-25 DIAGNOSIS — M9902 Segmental and somatic dysfunction of thoracic region: Secondary | ICD-10-CM | POA: Diagnosis not present

## 2023-05-25 DIAGNOSIS — M9903 Segmental and somatic dysfunction of lumbar region: Secondary | ICD-10-CM | POA: Diagnosis not present

## 2023-05-25 DIAGNOSIS — S134XXA Sprain of ligaments of cervical spine, initial encounter: Secondary | ICD-10-CM | POA: Diagnosis not present

## 2023-05-25 DIAGNOSIS — S233XXA Sprain of ligaments of thoracic spine, initial encounter: Secondary | ICD-10-CM | POA: Diagnosis not present

## 2023-05-25 DIAGNOSIS — M5441 Lumbago with sciatica, right side: Secondary | ICD-10-CM | POA: Diagnosis not present

## 2023-05-26 DIAGNOSIS — W19XXXA Unspecified fall, initial encounter: Secondary | ICD-10-CM | POA: Diagnosis not present

## 2023-05-26 DIAGNOSIS — Z743 Need for continuous supervision: Secondary | ICD-10-CM | POA: Diagnosis not present

## 2023-05-26 DIAGNOSIS — M6281 Muscle weakness (generalized): Secondary | ICD-10-CM | POA: Diagnosis not present

## 2023-05-28 ENCOUNTER — Telehealth: Payer: Self-pay | Admitting: Internal Medicine

## 2023-05-28 NOTE — Telephone Encounter (Signed)
Copied from CRM [id number]. Topic: Clinical - Red Word Triage >> May 28, 2023  1:21 PM Theodis Sato wrote: Red Word that prompted transfer to Nurse Triage: Severe back  pain can't even move. Wife called and states submitted my chart message regarding husband's pain.  She has an apt and would like something called in for him. She would also like a referral to pain management.  Pain clinic fax for referral [phone] Office updated Wife would like call back.

## 2023-05-29 ENCOUNTER — Telehealth (INDEPENDENT_AMBULATORY_CARE_PROVIDER_SITE_OTHER): Payer: Medicare HMO | Admitting: Internal Medicine

## 2023-05-29 DIAGNOSIS — N1831 Chronic kidney disease, stage 3a: Secondary | ICD-10-CM

## 2023-05-29 DIAGNOSIS — E1165 Type 2 diabetes mellitus with hyperglycemia: Secondary | ICD-10-CM

## 2023-05-29 DIAGNOSIS — M545 Low back pain, unspecified: Secondary | ICD-10-CM

## 2023-05-29 DIAGNOSIS — Z7984 Long term (current) use of oral hypoglycemic drugs: Secondary | ICD-10-CM

## 2023-05-29 MED ORDER — OXYCODONE HCL 5 MG PO TABS
5.0000 mg | ORAL_TABLET | Freq: Four times a day (QID) | ORAL | 0 refills | Status: DC | PRN
Start: 2023-05-29 — End: 2023-06-05

## 2023-05-29 NOTE — Telephone Encounter (Signed)
Copied from CRM [id number]. Topic: Appointments - Appointment Info/Confirmation >> May 29, 2023  2:49 PM Grant Bowers wrote:  Patient's wife needs instructions on how to access virtual appointment for today. Wife is visually impaired and will be needing instructions as soon as possible on how to access everything. She wanted to let Dr. Jonny Ruiz know that she just needs a little bit of grace today because she is not the best with technology.

## 2023-05-29 NOTE — Patient Instructions (Signed)
Please take all new medication as prescribed - the pain medication as needed  Please continue all other medications as before, and refills have been done if requested.  Please have the pharmacy call with any other refills you may need.  Please keep your appointments with your specialists as you may have planned  You will be contacted regarding the referral for: Home Health with Nurse and PT  You will be contacted regarding the referral for: orthopedic - Dr Vassie Loll as you requested

## 2023-05-29 NOTE — Progress Notes (Unsigned)
Patient ID: Grant Bowers, male   DOB: [birth year], 78 y.o.   MRN: [id number]  Virtual Visit via Video Note  I connected with Grant Bowers on 05/31/23 at  3:40 PM EST by a video enabled telemedicine application and verified that I am speaking with the correct person using two identifiers.  Location of all participants today Patient: at home with wife on call Provider: at office   I discussed the limitations of evaluation and management by telemedicine and the availability of in person appointments. The patient expressed understanding and agreed to proceed.  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  No falls or injury or fall.  Declines ED referral. Has seen chiropracter recently without improvement.  Was seen in ED with tx with prendisone and muscle relaxer without help so far subjectively.   Pt denies chest pain, increased sob or doe, wheezing, orthopnea, PND, increased LE swelling, palpitations, dizziness or syncope.   Pt denies polydipsia, polyuria, or new focal neuro s/s.    Pt denies fever, wt loss, night sweats, loss of appetite, or other constitutional symptoms  Wife has specific requests for pain control, HH with PT, nurse and refer ortho.   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   [facility];   followed by [facility] cardiology--- dr Graciela Husbands   Diabetes mellitus without complication (HCC)    Dyspnea due to COVID-19    with exertion   ED (erectile dysfunction)    GERD (gastroesophageal reflux disease)    History of 2019 novel coronavirus disease (COVID-19) 05/07/2020   per pt mild symptoms all resolved with exception DOE   History of cardiac murmur as a child    History of kidney stones    Hyperlipidemia    Hypertension     followed by pcp and cardiology   Hypogonadism male    Mild atherosclerosis of both carotid arteries 09/23/2015   duplex doppler in epic bilateral ICA 1-39%   OSA on CPAP    per pt uses nightly   Post-COVID chronic dyspnea    had covid 05-07-2020;  per pt never had issue prior to covid ;  PFT 10-19-2020 normal per dr c. young   Right ureteral calculus 02/11/2010   Skin cancer    Calf   Trifascicular block    alternating with BBB per dr Graciela Husbands note;  s/p PPM 07-09-2020;   echo 07-29-2020 mild LVH, G1DD, ef 65-70%, moderate LAE, mild MR without stenosis   Past Surgical History:  Procedure Laterality Date   CARDIAC CATHETERIZATION  2009   per pt in [place] , told was normal   CARDIAC CATHETERIZATION  02/17/2010   @[facility]   by dr Clifton Kole Hilyard--- no angiographic evidence cad, normal lvsf,  false positive stress test   COLONOSCOPY  12/07/2006   [facility] in [place]    CYSTOSCOPY WITH RETROGRADE PYELOGRAM, URETEROSCOPY AND STENT PLACEMENT Right 10/29/2020   Procedure: CYSTOSCOPY WITH RETROGRADE PYELOGRAM, URETEROSCOPY AND STENT PLACEMENT;  Surgeon: Marcine Matar, MD;  Location: [facility];  Service: Urology;  Laterality: Right;   CYSTOSCOPY WITH RETROGRADE PYELOGRAM, URETEROSCOPY AND STENT PLACEMENT Right 11/09/2020   Procedure: CYSTOSCOPY WITH RIGHT  RETROGRADE PYELOGRAM, URETEROSCOPY AND Tommy Rainwater;  Surgeon: Belva Agee, MD;  Location: [facility];  Service: Urology;  Laterality: Right;  30 MINS   CYSTOSCOPY WITH URETEROSCOPY, STONE BASKETRY AND STENT PLACEMENT  2012   CYSTOSCOPY/URETEROSCOPY/HOLMIUM LASER/STENT PLACEMENT Right 12/20/2021   Procedure: CYSTOSCOPY/URETEROSCOPY/HOLMIUM LASER/STENT PLACEMENT;  Surgeon: Belva Agee, MD;  Location: [facility];  Service: Urology;  Laterality: Right;  45 MINS   EXTRACORPOREAL SHOCK WAVE LITHOTRIPSY Right 04/29/2020   Procedure: EXTRACORPOREAL SHOCK WAVE LITHOTRIPSY (ESWL);  Surgeon: Noel Christmas, MD;  Location: [facility];  Service: Urology;  Laterality: Right;   HOLMIUM LASER APPLICATION Right 11/09/2020   Procedure: HOLMIUM LASER APPLICATION;  Surgeon: Belva Agee, MD;  Location: [facility];  Service: Urology;  Laterality: Right;   KNEE ARTHROSCOPY Left 2008   PACEMAKER IMPLANT N/A 07/09/2020   Procedure: PACEMAKER IMPLANT;  Surgeon: Duke Salvia, MD;  Location: [facility];  Service: Cardiovascular;  Laterality: N/A;    reports that he has never smoked. He has never used smokeless tobacco. He reports that he does not currently use alcohol. He reports that he does not use drugs. family history includes Colon polyps in his brother; Diabetes in his brother and father; Pancreatic cancer in his mother. No Known Allergies Current Outpatient Medications on File Prior to Visit  Medication Sig Dispense Refill   acetaminophen (TYLENOL) 325 MG tablet Take 650 mg by mouth every 6 (six) hours as needed for fever.     Alcohol Swabs (B-D SINGLE USE SWABS REGULAR) PADS Use as directed once per day E11.9 100 each 12   alfuzosin (UROXATRAL) 10 MG 24 hr tablet Take 10 mg by mouth at bedtime.     amLODipine (NORVASC) 10 MG tablet TAKE 1 TABLET EVERY DAY ANNUAL APPT DUE IN JANUARY MUST SEE PROVIDER FOR FUTURE REFILLS 90 tablet 3   Blood Glucose Calibration (TRUE METRIX LEVEL 1) Low SOLN Use as directed once per day E11.9 1 each 1   Blood Glucose Monitoring Suppl (TRUE METRIX METER) DEVI USE AS DIRECTED TO test blood sugar EVERY DAY     cetirizine (ZYRTEC) 10 MG tablet TAKE 1 TABLET EVERY DAY 90 tablet 3   Chlorpheniramine-DM (CORICIDIN HBP COUGH/COLD PO) Take 1 tablet by mouth daily as needed (cough/cold). (Patient not taking: Reported on 05/21/2023)     Cholecalciferol (VITAMIN D3 SUPER STRENGTH) 50 MCG (2000 UT) TABS Take 2,000 Units by mouth daily.     glucose blood (TRUE METRIX BLOOD GLUCOSE TEST) test strip TEST BLOOD SUGAR TWO TIMES DAILY AS INSTRUCTED 200 strip 3   hydrochlorothiazide  (MICROZIDE) 12.5 MG capsule TAKE 1 CAPSULE EVERY DAY 90 capsule 3   irbesartan (AVAPRO) 300 MG tablet TAKE 1 TABLET AT BEDTIME. ANNUAL APPT DUE IN [place]. MUST SEE PROVIDER FOR FUTURE REFILLS 90 tablet 3   metFORMIN (GLUCOPHAGE-XR) 500 MG 24 hr tablet TAKE 1 TABLET EVERY DAY WITH BREAKFAST 90 tablet 3   omeprazole (PRILOSEC) 10 MG capsule TAKE 1 CAPSULE EVERY DAY ANNUAL APPT DUE IN JANUARY MUST SEE PROVIDER FOR FUTURE REFILLS 90 capsule 3   predniSONE (DELTASONE) 20 MG tablet Take 40 mg by mouth daily.     rosuvastatin (CRESTOR) 20 MG tablet TAKE 1 TABLET EVERY DAY 90 tablet 3   tolterodine (DETROL LA) 2 MG 24 hr capsule Take 2 mg by mouth daily.     TRUEplus Lancets 33G MISC Use as directed once per day E11.9 100 each 12   zinc gluconate 50 MG tablet Take 50 mg by mouth daily.  No current facility-administered medications on file prior to visit.    Observations/Objective: Alert, NAD, appropriate mood and affect, resps normal, cn 2-12 intact, moves all 4s, no visible rash or swelling Lab Results  Component Value Date   WBC 9.5 04/26/2023   HGB 10.8 (L) 04/26/2023   HCT 32.8 (L) 04/26/2023   PLT 292 04/26/2023   GLUCOSE 180 (H) 05/03/2023   CHOL 96 (L) 05/03/2023   TRIG 82 05/03/2023   HDL 30 (L) 05/03/2023   LDLDIRECT 150.3 02/11/2010   LDLCALC 49 05/03/2023   ALT 16 05/03/2023   AST 20 05/03/2023   NA 138 05/03/2023   K 4.5 05/03/2023   CL 101 05/03/2023   CREATININE 1.52 (H) 05/03/2023   BUN 20 05/03/2023   CO2 21 05/03/2023   TSH 2.600 05/03/2023   PSA 9.40 (H) 04/17/2023   INR 1.2 ratio (H) 02/15/2010   HGBA1C 6.2 04/17/2023   MICROALBUR 36.0 (H) 04/17/2023   Assessment and Plan: See notes  Follow Up Instructions: See notes   I discussed the assessment and treatment plan with the patient. The patient was provided an opportunity to ask questions and all were answered. The patient agreed with the plan and demonstrated an understanding of the instructions.   The  patient was advised to call back or seek an in-person evaluation if the symptoms worsen or if the condition fails to improve as anticipated.   Oliver Barre, MD

## 2023-05-30 NOTE — Telephone Encounter (Signed)
Virtual appt was done yesterday.

## 2023-05-31 ENCOUNTER — Encounter: Payer: Self-pay | Admitting: Internal Medicine

## 2023-05-31 DIAGNOSIS — M545 Low back pain, unspecified: Secondary | ICD-10-CM | POA: Insufficient documentation

## 2023-05-31 NOTE — Assessment & Plan Note (Signed)
Lab Results  Component Value Date   HGBA1C 6.2 04/17/2023   Stable, pt to continue current medical treatment metfomrin ER 500 mg - 1 every day,

## 2023-05-31 NOTE — Assessment & Plan Note (Signed)
Now severe per wife and pt, less mobile today without worsening fever or LE symptoms such as weakness or falls; declines further imaging or labs; for oxycodone prn, refer for [facility] with nurse and PT, and refer orthopedic Dr Craig Guess as per wife

## 2023-05-31 NOTE — Assessment & Plan Note (Signed)
Lab Results  Component Value Date   CREATININE 1.52 (H) 05/03/2023   Stable overall, cont to avoid nephrotoxins

## 2023-06-03 ENCOUNTER — Encounter (HOSPITAL_COMMUNITY): Payer: Self-pay | Admitting: Emergency Medicine

## 2023-06-03 ENCOUNTER — Emergency Department (HOSPITAL_COMMUNITY): Payer: Medicare HMO

## 2023-06-03 ENCOUNTER — Inpatient Hospital Stay (HOSPITAL_COMMUNITY)
Admission: EM | Admit: 2023-06-03 | Discharge: 2023-06-05 | DRG: 445 | Disposition: A | Discharge status: Home Health | Payer: Medicare HMO | Attending: Family Medicine | Admitting: Family Medicine

## 2023-06-03 ENCOUNTER — Other Ambulatory Visit: Payer: Self-pay

## 2023-06-03 DIAGNOSIS — I10 Essential (primary) hypertension: Secondary | ICD-10-CM | POA: Diagnosis present

## 2023-06-03 DIAGNOSIS — E8809 Other disorders of plasma-protein metabolism, not elsewhere classified: Secondary | ICD-10-CM | POA: Diagnosis present

## 2023-06-03 DIAGNOSIS — Z79899 Other long term (current) drug therapy: Secondary | ICD-10-CM

## 2023-06-03 DIAGNOSIS — Z87442 Personal history of urinary calculi: Secondary | ICD-10-CM

## 2023-06-03 DIAGNOSIS — S3991XA Unspecified injury of abdomen, initial encounter: Secondary | ICD-10-CM | POA: Diagnosis not present

## 2023-06-03 DIAGNOSIS — R1010 Upper abdominal pain, unspecified: Secondary | ICD-10-CM | POA: Diagnosis not present

## 2023-06-03 DIAGNOSIS — Z83719 Family history of colon polyps, unspecified: Secondary | ICD-10-CM

## 2023-06-03 DIAGNOSIS — R198 Other specified symptoms and signs involving the digestive system and abdomen: Principal | ICD-10-CM

## 2023-06-03 DIAGNOSIS — Z800 Family history of malignant neoplasm of digestive organs: Secondary | ICD-10-CM | POA: Diagnosis not present

## 2023-06-03 DIAGNOSIS — E1165 Type 2 diabetes mellitus with hyperglycemia: Secondary | ICD-10-CM | POA: Diagnosis not present

## 2023-06-03 DIAGNOSIS — M5127 Other intervertebral disc displacement, lumbosacral region: Secondary | ICD-10-CM | POA: Diagnosis present

## 2023-06-03 DIAGNOSIS — I453 Trifascicular block: Secondary | ICD-10-CM | POA: Diagnosis not present

## 2023-06-03 DIAGNOSIS — G4733 Obstructive sleep apnea (adult) (pediatric): Secondary | ICD-10-CM | POA: Diagnosis present

## 2023-06-03 DIAGNOSIS — M5126 Other intervertebral disc displacement, lumbar region: Secondary | ICD-10-CM | POA: Diagnosis not present

## 2023-06-03 DIAGNOSIS — M545 Low back pain, unspecified: Secondary | ICD-10-CM | POA: Diagnosis not present

## 2023-06-03 DIAGNOSIS — Z950 Presence of cardiac pacemaker: Secondary | ICD-10-CM | POA: Diagnosis not present

## 2023-06-03 DIAGNOSIS — M25551 Pain in right hip: Secondary | ICD-10-CM | POA: Diagnosis present

## 2023-06-03 DIAGNOSIS — R1030 Lower abdominal pain, unspecified: Secondary | ICD-10-CM | POA: Diagnosis not present

## 2023-06-03 DIAGNOSIS — K219 Gastro-esophageal reflux disease without esophagitis: Secondary | ICD-10-CM | POA: Diagnosis present

## 2023-06-03 DIAGNOSIS — R296 Repeated falls: Secondary | ICD-10-CM | POA: Diagnosis present

## 2023-06-03 DIAGNOSIS — Z6825 Body mass index (BMI) 25.0-25.9, adult: Secondary | ICD-10-CM

## 2023-06-03 DIAGNOSIS — M47816 Spondylosis without myelopathy or radiculopathy, lumbar region: Secondary | ICD-10-CM | POA: Diagnosis not present

## 2023-06-03 DIAGNOSIS — E871 Hypo-osmolality and hyponatremia: Secondary | ICD-10-CM | POA: Diagnosis present

## 2023-06-03 DIAGNOSIS — R1011 Right upper quadrant pain: Secondary | ICD-10-CM | POA: Diagnosis not present

## 2023-06-03 DIAGNOSIS — R000 Tachycardia, unspecified: Secondary | ICD-10-CM | POA: Diagnosis not present

## 2023-06-03 DIAGNOSIS — E440 Moderate protein-calorie malnutrition: Secondary | ICD-10-CM | POA: Diagnosis present

## 2023-06-03 DIAGNOSIS — Z833 Family history of diabetes mellitus: Secondary | ICD-10-CM | POA: Diagnosis not present

## 2023-06-03 DIAGNOSIS — N400 Enlarged prostate without lower urinary tract symptoms: Secondary | ICD-10-CM | POA: Diagnosis not present

## 2023-06-03 DIAGNOSIS — K5900 Constipation, unspecified: Secondary | ICD-10-CM | POA: Diagnosis present

## 2023-06-03 DIAGNOSIS — S0990XA Unspecified injury of head, initial encounter: Secondary | ICD-10-CM | POA: Diagnosis not present

## 2023-06-03 DIAGNOSIS — Z043 Encounter for examination and observation following other accident: Secondary | ICD-10-CM | POA: Diagnosis not present

## 2023-06-03 DIAGNOSIS — Z743 Need for continuous supervision: Secondary | ICD-10-CM | POA: Diagnosis not present

## 2023-06-03 DIAGNOSIS — R627 Adult failure to thrive: Secondary | ICD-10-CM | POA: Diagnosis present

## 2023-06-03 DIAGNOSIS — E46 Unspecified protein-calorie malnutrition: Secondary | ICD-10-CM | POA: Diagnosis not present

## 2023-06-03 DIAGNOSIS — M2578 Osteophyte, vertebrae: Secondary | ICD-10-CM | POA: Diagnosis present

## 2023-06-03 DIAGNOSIS — Z85828 Personal history of other malignant neoplasm of skin: Secondary | ICD-10-CM

## 2023-06-03 DIAGNOSIS — E860 Dehydration: Secondary | ICD-10-CM | POA: Diagnosis present

## 2023-06-03 DIAGNOSIS — E782 Mixed hyperlipidemia: Secondary | ICD-10-CM | POA: Diagnosis present

## 2023-06-03 DIAGNOSIS — K828 Other specified diseases of gallbladder: Secondary | ICD-10-CM | POA: Diagnosis not present

## 2023-06-03 DIAGNOSIS — R109 Unspecified abdominal pain: Secondary | ICD-10-CM | POA: Diagnosis not present

## 2023-06-03 DIAGNOSIS — Z7952 Long term (current) use of systemic steroids: Secondary | ICD-10-CM

## 2023-06-03 DIAGNOSIS — R55 Syncope and collapse: Secondary | ICD-10-CM | POA: Diagnosis present

## 2023-06-03 DIAGNOSIS — Z8616 Personal history of COVID-19: Secondary | ICD-10-CM

## 2023-06-03 DIAGNOSIS — M549 Dorsalgia, unspecified: Secondary | ICD-10-CM | POA: Diagnosis not present

## 2023-06-03 DIAGNOSIS — S3993XA Unspecified injury of pelvis, initial encounter: Secondary | ICD-10-CM | POA: Diagnosis not present

## 2023-06-03 DIAGNOSIS — K8020 Calculus of gallbladder without cholecystitis without obstruction: Secondary | ICD-10-CM | POA: Diagnosis not present

## 2023-06-03 DIAGNOSIS — R0989 Other specified symptoms and signs involving the circulatory and respiratory systems: Secondary | ICD-10-CM | POA: Diagnosis not present

## 2023-06-03 DIAGNOSIS — N270 Small kidney, unilateral: Secondary | ICD-10-CM | POA: Diagnosis not present

## 2023-06-03 DIAGNOSIS — I6523 Occlusion and stenosis of bilateral carotid arteries: Secondary | ICD-10-CM | POA: Diagnosis not present

## 2023-06-03 LAB — URINALYSIS, ROUTINE W REFLEX MICROSCOPIC
Bacteria, UA: NONE SEEN
Bilirubin Urine: NEGATIVE
Glucose, UA: NEGATIVE mg/dL
Hgb urine dipstick: NEGATIVE
Ketones, ur: 5 mg/dL — AB
Leukocytes,Ua: NEGATIVE
Nitrite: NEGATIVE
Protein, ur: 30 mg/dL — AB
Specific Gravity, Urine: 1.016 (ref 1.005–1.030)
pH: 6 (ref 5.0–8.0)

## 2023-06-03 LAB — CBC WITH DIFFERENTIAL/PLATELET
Abs Immature Granulocytes: 0.04 10*3/uL (ref 0.00–0.07)
Basophils Absolute: 0 10*3/uL (ref 0.0–0.1)
Basophils Relative: 0 %
Eosinophils Absolute: 0 10*3/uL (ref 0.0–0.5)
Eosinophils Relative: 0 %
HCT: 32.6 % — ABNORMAL LOW (ref 39.0–52.0)
Hemoglobin: 10.7 g/dL — ABNORMAL LOW (ref 13.0–17.0)
Immature Granulocytes: 0 %
Lymphocytes Relative: 6 %
Lymphs Abs: 0.5 10*3/uL — ABNORMAL LOW (ref 0.7–4.0)
MCH: 27.1 pg (ref 26.0–34.0)
MCHC: 32.8 g/dL (ref 30.0–36.0)
MCV: 82.5 fL (ref 80.0–100.0)
Monocytes Absolute: 0.5 10*3/uL (ref 0.1–1.0)
Monocytes Relative: 6 %
Neutro Abs: 7.9 10*3/uL — ABNORMAL HIGH (ref 1.7–7.7)
Neutrophils Relative %: 88 %
Platelets: 265 10*3/uL (ref 150–400)
RBC: 3.95 MIL/uL — ABNORMAL LOW (ref 4.22–5.81)
RDW: 13.9 % (ref 11.5–15.5)
WBC: 9 10*3/uL (ref 4.0–10.5)
nRBC: 0 % (ref 0.0–0.2)

## 2023-06-03 LAB — RAPID URINE DRUG SCREEN, HOSP PERFORMED
Amphetamines: NOT DETECTED
Barbiturates: NOT DETECTED
Benzodiazepines: NOT DETECTED
Cocaine: NOT DETECTED
Opiates: NOT DETECTED
Tetrahydrocannabinol: NOT DETECTED

## 2023-06-03 LAB — COMPREHENSIVE METABOLIC PANEL
ALT: 37 U/L (ref 0–44)
AST: 24 U/L (ref 15–41)
Albumin: 2.3 g/dL — ABNORMAL LOW (ref 3.5–5.0)
Alkaline Phosphatase: 88 U/L (ref 38–126)
Anion gap: 13 (ref 5–15)
BUN: 27 mg/dL — ABNORMAL HIGH (ref 8–23)
CO2: 22 mmol/L (ref 22–32)
Calcium: 8.4 mg/dL — ABNORMAL LOW (ref 8.9–10.3)
Chloride: 97 mmol/L — ABNORMAL LOW (ref 98–111)
Creatinine, Ser: 1.04 mg/dL (ref 0.61–1.24)
GFR, Estimated: >60 mL/min (ref >60)
Glucose, Bld: 153 mg/dL — ABNORMAL HIGH (ref 70–99)
Potassium: 3.8 mmol/L (ref 3.5–5.1)
Sodium: 132 mmol/L — ABNORMAL LOW (ref 135–145)
Total Bilirubin: 0.9 mg/dL (ref 0.0–1.2)
Total Protein: 5.6 g/dL — ABNORMAL LOW (ref 6.5–8.1)

## 2023-06-03 LAB — ETHANOL: Alcohol, Ethyl (B): <10 mg/dL (ref <10)

## 2023-06-03 MED ORDER — HYDROMORPHONE HCL 1 MG/ML IJ SOLN
0.5000 mg | Freq: Once | INTRAMUSCULAR | Status: AC
  Administered 2023-06-03: 0.5 mg via INTRAVENOUS
  Filled 2023-06-03: qty 0.5

## 2023-06-03 MED ORDER — IOHEXOL 350 MG/ML SOLN
80.0000 mL | Freq: Once | INTRAVENOUS | Status: AC | PRN
  Administered 2023-06-03: 80 mL via INTRAVENOUS

## 2023-06-03 MED ORDER — SODIUM CHLORIDE 0.9 % IV BOLUS
500.0000 mL | Freq: Once | INTRAVENOUS | Status: AC
  Administered 2023-06-03: 500 mL via INTRAVENOUS

## 2023-06-03 NOTE — ED Notes (Signed)
 Per family pt has had multiple falls within the past few days, Dr. Estell Harpin made aware. New orders placed

## 2023-06-03 NOTE — H&P (Signed)
 History and Physical    Patient: Grant Bowers ZOX:[id number] DOB: 1945/09/24 DOA: 06/03/2023 DOS: the patient was seen and examined on 06/04/2023 PCP: [name], MD  Patient coming from: Home  Chief Complaint:  Chief Complaint  Patient presents with   Back Pain   HPI: Grant Bowers is a 78 y.o. male with medical history significant of type 2 diabetes mellitus, essential hypertension, mixed hyperlipidemia, low back pain (on oxycodone at home.), trifascicular block s/p PPM,  OSA on CPAP, GERD who presents to the ED emergency department due to worsening low back pain.  Patient.endorsed 3 episodes of fall within last few days.  Pain was right-sided low back pain with radiation into the groin, it was severe and was without any elevating/aggravating factor.  Patient also complained of right upper quadrant pain which was moderate in intensity.  He denies chest pain and shortness of breath.  ED Course:  In the emergency department, respiratory rate was 20/min, pulse 110/min, BP 124/57, other vital signs were within normal range.  Workup in the ED showed normocytic anemia, BMP showed sodium 132, potassium 3.8, chloride 97, bicarb 22, glucose 153, BUN 27, creatinine 1.04 albumin 2.3, urinalysis was normal, urine drug screen was normal, alcohol level was less than 10. CT head without contrast showed no acute intracranial abnormality CT cervical spine without contrast showed no acute fracture or static subluxation of the cervical spine gastric aspirate- CT lumbar spine without contrast showed no convincing acute lumbar fracture or focal pathological process.  Right paracentral shallow disc protrusion at L4-5 with likely mass effect on the descending right L5 nerve root. Shallow central disc protrusion at L5-S1 with disc osteophyte complex and protrusion causing mild mass effect on the S1 nerve roots. CT angiography of chest, abdomen and pelvis showed dilated gallbladder with stone and possible  pericholecystic edema and slight wall thickening. General surgery (Dr. Robyne Peers) was consulted and recommended admitting patient with plan to consult on patient in the morning.  Hospitalist was asked to admit patient for further evaluation and management.  Review of Systems: Review of systems as noted in the HPI. All other systems reviewed and are negative.   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   [facility];   followed by [facility] cardiology--- dr Graciela Husbands   Diabetes mellitus without complication (HCC)    Dyspnea due to COVID-19    with exertion   ED (erectile dysfunction)    GERD (gastroesophageal reflux disease)    History of 2019 novel coronavirus disease (COVID-19) 05/07/2020   per pt mild symptoms all resolved with exception DOE   History of cardiac murmur as a child    History of kidney stones    Hyperlipidemia    Hypertension    followed by pcp and cardiology   Hypogonadism male    Mild atherosclerosis of both carotid arteries 09/23/2015   duplex doppler in epic bilateral ICA 1-39%   OSA on CPAP    per pt uses nightly   Post-COVID chronic dyspnea    had covid 05-07-2020;  per pt never had issue prior to covid ;  PFT 10-19-2020 normal per dr c. young   Right ureteral calculus 02/11/2010   Skin cancer    Calf   Trifascicular block    alternating with BBB per dr Graciela Husbands note;  s/p PPM 07-09-2020;   echo 07-29-2020 mild  LVH, G1DD, ef 65-70%, moderate LAE, mild MR without stenosis   Past Surgical History:  Procedure Laterality Date   CARDIAC CATHETERIZATION  2009   per pt in [place] , told was normal   CARDIAC CATHETERIZATION  02/17/2010   @[facility]   by dr Clifton James--- no angiographic evidence cad, normal lvsf,  false positive stress test   COLONOSCOPY  12/07/2006   [facility] in [place]    CYSTOSCOPY WITH RETROGRADE PYELOGRAM, URETEROSCOPY AND STENT PLACEMENT  Right 10/29/2020   Procedure: CYSTOSCOPY WITH RETROGRADE PYELOGRAM, URETEROSCOPY AND STENT PLACEMENT;  Surgeon: Marcine Matar, MD;  Location: [facility];  Service: Urology;  Laterality: Right;   CYSTOSCOPY WITH RETROGRADE PYELOGRAM, URETEROSCOPY AND STENT PLACEMENT Right 11/09/2020   Procedure: CYSTOSCOPY WITH RIGHT  RETROGRADE PYELOGRAM, URETEROSCOPY AND STENT EXHANGE;  Surgeon: Belva Agee, MD;  Location: [facility];  Service: Urology;  Laterality: Right;  30 MINS   CYSTOSCOPY WITH URETEROSCOPY, STONE BASKETRY AND STENT PLACEMENT  2012   CYSTOSCOPY/URETEROSCOPY/HOLMIUM LASER/STENT PLACEMENT Right 12/20/2021   Procedure: CYSTOSCOPY/URETEROSCOPY/HOLMIUM LASER/STENT PLACEMENT;  Surgeon: Belva Agee, MD;  Location: [facility];  Service: Urology;  Laterality: Right;  45 MINS   EXTRACORPOREAL SHOCK WAVE LITHOTRIPSY Right 04/29/2020   Procedure: EXTRACORPOREAL SHOCK WAVE LITHOTRIPSY (ESWL);  Surgeon: Noel Christmas, MD;  Location: [facility];  Service: Urology;  Laterality: Right;   HOLMIUM LASER APPLICATION Right 11/09/2020   Procedure: HOLMIUM LASER APPLICATION;  Surgeon: Belva Agee, MD;  Location: [facility];  Service: Urology;  Laterality: Right;   KNEE ARTHROSCOPY Left 2008   PACEMAKER IMPLANT N/A 07/09/2020   Procedure: PACEMAKER IMPLANT;  Surgeon: Duke Salvia, MD;  Location: [facility] INVASIVE CV [facility];  Service: Cardiovascular;  Laterality: N/A;    Social History:  reports that he has never smoked. He has never used smokeless tobacco. He reports that he does not currently use alcohol. He reports that he does not use drugs.   No Known Allergies  Family History  Problem Relation Age of Onset   Pancreatic cancer Mother    Diabetes Father    Diabetes Brother    Colon polyps Brother    Colon cancer Neg Hx    Esophageal cancer Neg Hx    Rectal cancer Neg Hx    Stomach cancer Neg Hx      Prior to Admission medications   Medication  Sig Start Date End Date Taking? Authorizing Provider  acetaminophen (TYLENOL) 325 MG tablet Take 650 mg by mouth every 6 (six) hours as needed for fever.    [provider]  Alcohol Swabs (B-D SINGLE USE SWABS REGULAR) PADS Use as directed once per day E11.9 12/29/20   [name], MD  alfuzosin (UROXATRAL) 10 MG 24 hr tablet Take 10 mg by mouth at bedtime. 12/08/21   [provider]  amLODipine (NORVASC) 10 MG tablet TAKE 1 TABLET EVERY DAY ANNUAL APPT DUE IN [place] MUST SEE PROVIDER FOR FUTURE REFILLS 04/09/23   [name], MD  Blood Glucose Calibration (TRUE METRIX LEVEL 1) Low SOLN Use as directed once per day E11.9 12/29/20   [name], MD  Blood Glucose Monitoring Suppl (TRUE METRIX METER) DEVI USE AS DIRECTED TO test blood sugar EVERY DAY 04/11/22   [provider]  cetirizine (ZYRTEC) 10 MG tablet TAKE 1 TABLET EVERY DAY 04/13/22   [name], MD  Chlorpheniramine-DM (CORICIDIN HBP COUGH/COLD PO) Take 1 tablet by mouth daily as needed (cough/cold). Patient not taking:  Reported on 05/21/2023    [provider]  Cholecalciferol (VITAMIN D3 SUPER STRENGTH) 50 MCG (2000 UT) TABS Take 2,000 Units by mouth daily.    [provider]  glucose blood (TRUE METRIX BLOOD GLUCOSE TEST) test strip TEST BLOOD SUGAR TWO TIMES DAILY AS INSTRUCTED 09/14/22   [name], MD  hydrochlorothiazide (MICROZIDE) 12.5 MG capsule TAKE 1 CAPSULE EVERY DAY 04/09/23   [name], MD  irbesartan (AVAPRO) 300 MG tablet TAKE 1 TABLET AT BEDTIME. ANNUAL APPT DUE IN [place]. MUST SEE PROVIDER FOR FUTURE REFILLS 04/26/23   [name], MD  metFORMIN (GLUCOPHAGE-XR) 500 MG 24 hr tablet TAKE 1 TABLET EVERY DAY WITH BREAKFAST 04/17/23   [name], MD  omeprazole (PRILOSEC) 10 MG capsule TAKE 1 CAPSULE EVERY DAY ANNUAL APPT DUE IN [place] MUST SEE PROVIDER FOR FUTURE REFILLS 04/09/23   [name], MD  oxyCODONE (ROXICODONE) 5 MG immediate release tablet Take 1 tablet (5 mg  total) by mouth every 6 (six) hours as needed for severe pain (pain score 7-10). 05/29/23   [name], MD  predniSONE (DELTASONE) 20 MG tablet Take 40 mg by mouth daily. 05/20/23   [provider]  rosuvastatin (CRESTOR) 20 MG tablet TAKE 1 TABLET EVERY DAY 04/09/23   [name], MD  tolterodine (DETROL LA) 2 MG 24 hr capsule Take 2 mg by mouth daily. 05/09/23   [provider]  TRUEplus Lancets 33G MISC Use as directed once per day E11.9 12/29/20   [name], MD  zinc gluconate 50 MG tablet Take 50 mg by mouth daily.    [provider]    Physical Exam: BP 127/64 (BP Location: Right Arm)   Pulse 100   Temp 98.1 F (36.7 C) (Oral)   Resp (!) 21   Ht 6' (1.829 m)   Wt 80.4 kg   SpO2 96%   BMI 24.04 kg/m   General: 78 y.o. year-old male well developed well nourished in no acute distress.  Alert and oriented x3. HEENT: NCAT, EOMI Neck: Supple, trachea medial Cardiovascular: Regular rate and rhythm with no rubs or gallops.  No thyromegaly or JVD noted.  No lower extremity edema. 2/4 pulses in all 4 extremities. Respiratory: Clear to auscultation with no wheezes or rales. Good inspiratory effort. Abdomen: Soft, tender to palpation of RUQ.  Nondistended with normal bowel sounds x4 quadrants. Muskuloskeletal: Right-sided low back pain.  No cyanosis, clubbing or edema noted bilaterally Neuro: CN II-XII intact, strength 5/5 x 4, sensation, reflexes intact Skin: No ulcerative lesions noted or rashes Psychiatry: Judgement and insight appear normal. Mood is appropriate for condition and setting          Labs on Admission:  Basic Metabolic Panel: Recent Labs  Lab 06/03/23 2153  NA 132*  K 3.8  CL 97*  CO2 22  GLUCOSE 153*  BUN 27*  CREATININE 1.04  CALCIUM 8.4*   Liver Function Tests: Recent Labs  Lab 06/03/23 2153  AST 24  ALT 37  ALKPHOS 88  BILITOT 0.9  PROT 5.6*  ALBUMIN 2.3*   No results for input(s): "LIPASE", "AMYLASE" in the last  168 hours. No results for input(s): "AMMONIA" in the last 168 hours. CBC: Recent Labs  Lab 06/03/23 2153  WBC 9.0  NEUTROABS 7.9*  HGB 10.7*  HCT 32.6*  MCV 82.5  PLT 265   Cardiac Enzymes: No results for input(s): "CKTOTAL", "CKMB", "CKMBINDEX", "TROPONINI" in the last 168 hours.  BNP (last 3 results) Recent Labs    04/26/23 1444  BNP 80.0    ProBNP (last 3 results) No results for input(s): "PROBNP" in the last 8760 hours.  CBG: No results for input(s): "GLUCAP" in the last 168 hours.  Radiological Exams on Admission: CT Angio Chest/Abd/Pel for Dissection W and/or Wo Contrast Result Date: 06/03/2023 CLINICAL DATA:  Head and neck trauma, low back pain and multiple frequent falls. Evaluating for acute aortic syndrome. EXAM: CT ANGIOGRAPHY CHEST, ABDOMEN AND PELVIS TECHNIQUE: A noncontrast chest CT study was initially performed to look for aortic hematoma. Multidetector CT imaging through the chest, abdomen and pelvis was performed using the standard protocol during bolus administration of intravenous contrast. Multiplanar reconstructed images and MIPs were obtained and reviewed to evaluate the vascular anatomy. RADIATION DOSE REDUCTION: This exam was performed according to the departmental dose-optimization program which includes automated exposure control, adjustment of the mA and/or kV according to patient size and/or use of iterative reconstruction technique. CONTRAST:  80mL OMNIPAQUE IOHEXOL 350 MG/ML SOLN COMPARISON:  CTA chest and PA and lateral chest both 04/26/2023, CT abdomen pelvis without and with contrast 12/16/2018. FINDINGS: CTA CHEST FINDINGS Cardiovascular: There is metallic artifact from a left chest dual lead pacing system and wires in the right heart. The pulmonary arteries are normal in caliber, centrally clear on this nondedicated exam. There is preferential opacification of the aorta and great vessels. There is no aneurysm, dissection or stenosis. There are  moderate to heavy aortic calcific plaques, occasional calcifications in the great vessels. There is a normal variant brachiobicarotid trunk and normal variant origin of the left vertebral artery from the aortic arch. The cardiac size is normal. There is interval increased pericardial effusion now 1 cm in thickness, previously 7 mm. Scattered calcific plaque left main, lad and circumflex coronary arteries. No venous dilatation. Mediastinum/Nodes: Few slightly frontal bilateral hilar nodes up to 1.1 cm in short axis. No mediastinal or axillary adenopathy. Thyroid gland is unremarkable. The thoracic trachea, thoracic esophagus, both main bronchi unremarkable. Lungs/Pleura: There are bilateral minimal layering pleural effusions, both slightly increased since 04/26/2023. Again noted linear scarring or atelectasis in both lung bases. Mild fissural atelectasis in the left upper lobe. There is no consolidation or pneumothorax. No pulmonary nodules are seen. Musculoskeletal: Extensive bridging enthesopathy of the thoracic spine beginning at T4. Degenerative change and mild thoracic dextroscoliosis. No acute or other significant osseous findings.  No chest wall mass. Review of the MIP images confirms the above findings. CTA ABDOMEN AND PELVIS FINDINGS VASCULAR Aorta: Moderate to heavy aortoiliac calcific plaques. No stenosis. 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 Celiac: Patent without evidence of aneurysm, dissection, vasculitis or significant stenosis. Scattered branch calcific plaques splenic artery. No branch occlusions. Nonstenosing ostial calcific plaques. SMA: 50-60% calcific origin stenosis. The vessel otherwise opacifies well without visible branch occlusion. Minimal calcific plaque past the inflection point of the vessel. Renals: 2 roughly codominant arteries supplying each kidney. Both upper pole arteries demonstrate approximately 50% calcific origin stenosis.  The right lower pole artery appears to have at least a 75 % calcific origin stenosis. The left lower pole artery is patent. IMA: Patent without evidence of aneurysm, dissection, vasculitis or significant stenosis. Inflow: Patent without evidence of aneurysm, dissection, vasculitis or significant stenosis. There are mild-to-moderate calcific plaques. Veins: No obvious venous abnormality within the limitations of this arterial phase study. Review of the MIP images confirms the above findings. NON-VASCULAR Hepatobiliary: Small scattered hepatic cysts  and a few too small to characterize hepatic hypodensities, unchanged. No follow-up imaging recommended. Largest cyst is 1.7 cm and 11 Hounsfield units in the dome of segment 8. Gallbladder dilated to nearly 11 cm. There is a subcentimeter stone in the proximal lumen. On some of the axial images there is suspicion of pericholecystic edema and slight gallbladder wall thickening which may be seen with cholecystitis. Consider ultrasound follow-up for further study. No biliary dilatation. Pancreas: No abnormality. Spleen: Increased splenomegaly, the current AP splenic axis 17 cm previously 15.5 cm. No mass. Adrenals/Urinary Tract: There is no adrenal mass. Bilateral Bosniak 1 renal cysts are again seen, largest is a right parapelvic cyst measuring 4.6 cm and 15 Hounsfield units. No follow-up imaging is recommended. There is cortical thinning in both kidneys, scattered cortical scarring of the inferior poles. There previously was a 6 mm right ureteral stone which has passed. There is no hydronephrosis or appreciable urinary stones at present. No mass enhancement. The bladder is unremarkable. Stomach/Bowel: No dilatation or wall thickening including the appendix. Moderate fecal stasis. Sigmoid diverticulosis with no evidence of diverticulitis. Lymphatic: No appreciable adenopathy. Reproductive: 5.5 cm enlarged prostate. Scattered dystrophic prostatic calcifications. There is a  mild posterior bladder impression. Right testicle appears to be retracted into the inguinal canal. Other: Small umbilical and inguinal fat hernias. No free hemorrhage, free fluid or free air, or localizing collections. Musculoskeletal: Degenerative change and slight levoscoliosis lumbar spine. Ankylosed SI joints. Mild hip DJD. No concerning regional bone lesion. Lumbar spine CT is dictated separately. Review of the MIP images confirms the above findings. IMPRESSION: 1. No acute trauma related findings in the chest, abdomen or pelvis. 2. Aortoiliac and coronary artery atherosclerosis. No dissection or aortic stenosis. 3. 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 Recommend surveillance ultrasound in 5 years. Reference: Journal of Vascular Surgery 67.1 (2018): 2-77. J Am Coll Radiol 2013;10:789-794. 4. 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 5. 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 6. Dilated gallbladder with stone and possible pericholecystic edema and slight wall thickening. Consider ultrasound follow-up to assess for cholecystitis. 7. Increased splenomegaly. 8. Constipation and diverticulosis. 9. Prostatomegaly with mild posterior bladder impression. Right testicle appears retracted into the inguinal canal. 10. Umbilical and inguinal fat hernias. 11. Increased pericardial effusion now 1 cm in thickness, previously 7 mm. 12. Slightly increased bilateral pleural effusions. 13. Critical Value/emergent results were called by telephone at the time of interpretation on 06/03/2023 at 11:29 pm to provider JOSEPH ZAMMIT , who verbally acknowledged these results. Aortic Atherosclerosis (ICD10-I70.0). Electronically Signed   By: Almira Bar M.D.   On: 06/03/2023 23:47   CT L-SPINE NO CHARGE Result Date: 06/03/2023 CLINICAL DATA:  Frequent falls, worsening back pain. EXAM: CT LUMBAR SPINE WITHOUT CONTRAST TECHNIQUE: Multidetector CT imaging of the  lumbar spine was performed without intravenous contrast administration. Multiplanar CT image reconstructions were also generated. RADIATION DOSE REDUCTION: This exam was performed according to the departmental dose-optimization program which includes automated exposure control, adjustment of the mA and/or kV according to patient size and/or use of iterative reconstruction technique. COMPARISON:  CT abdomen and pelvis without and with contrast 12/15/2021 FINDINGS: Segmentation: 5 lumbar type vertebrae. Alignment: Chronic minimal discogenic degenerative grade 1 retrolisthesis L3-4, L4-5 and L5-S1. Slight levoscoliosis. No new, worsening or further alignment abnormality is seen. Vertebrae: Due to technique, the images are grainy with suboptimal spatial resolution. Accounting for this limitation no acute fracture is suspected or focal pathologic  process. The vertebra are normal in heights. There are prominent marginal osteophytes, with attempted bridging anteriorly at L2-3 and to the right anteriorly at L4-5 and L5-S1. Paraspinal and other soft tissues: Heavy aortoiliac calcific plaques. 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 Multiple partially visible Bosniak 1 right renal cysts. No follow-up imaging recommended. No paraspinal hematoma, mass or fluid collection. Disc levels: T11-12: The disc is degenerated. Small anterior osteophytes. No herniation or stenosis. T12-L1: The disc is normal in height. There is no bulge, herniation or stenosis. Trace facet spurring. L1-2: The disc is normal in height. There is a minimal nonstenosing dorsal disc bulge without herniation. The foramina are clear. Trace facet spurring. L2-3: The disc is normal in height. There is a mild diffuse annular bulge without herniation, spinal canal or foraminal stenosis. Mild facet joint spurring. L3-4: Mild-to-moderate disc space loss. Anterior and posterior osteophytes are present. Broad-based posterior disc osteophyte complex mildly encroaches  on the lateral recesses and descending L4 nerve roots. There is only slight spinal canal stenosis. Mild facet spurring with unilateral mild right foraminal stenosis. L4-5: Mild disc space loss. There is circumferential disc osteophyte complex. Right paracentral shallow disc protrusion is seen with likely mass effect on the descending right L5 nerve root. There is mild-to-moderate spinal canal stenosis with hypertrophic dorsal ligamentous calcification contributing. Facet spurring with bilateral mild foraminal stenosis. L5-S1: Mild disc space loss. Partial ossification across the posterior disc space. Posterior endplate osteophytes with posterior disc osteophyte complex again noted. Shallow central disc protrusion is seen, with disc osteophyte complex and protrusion causing mild mass effect on the S1 nerve roots. No significant foraminal narrowing and only mild facet spurring. 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 The visualized sacrum is intact. Unremarkable visualized posterior pelvis. IMPRESSION: 1. Suboptimal image quality, with no convincing acute lumbar fracture or focal pathologic process. 2. Scoliosis and degenerative change with multilevel disc osteophyte complexes, and protrusions at the lowest 2 levels. 3. Right paracentral shallow disc protrusion at L4-5 with likely mass effect on the descending right L5 nerve root. 4. Shallow central disc protrusion at L5-S1 with disc osteophyte complex and protrusion causing mild mass effect on the S1 nerve roots. 5. Multilevel facet spurring with mild foraminal stenosis. 6. Aortoiliac atherosclerosis. 7. 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 Recommend follow-up ultrasound every 5 years. Reference: Journal of Vascular Surgery 67.1 (2018): 2-77. J Am Coll Radiol [phone]. Aortic Atherosclerosis (ICD10-I70.0). Electronically Signed   By: Almira Bar M.D.   On: 06/03/2023 23:03   CT Head Wo Contrast Result Date: 06/03/2023 CLINICAL DATA:  Falls  EXAM: CT HEAD WITHOUT CONTRAST CT CERVICAL SPINE WITHOUT CONTRAST TECHNIQUE: Multidetector CT imaging of the head and cervical spine was performed following the standard protocol without intravenous contrast. Multiplanar CT image reconstructions of the cervical spine were also generated. RADIATION DOSE REDUCTION: This exam was performed according to the departmental dose-optimization program which includes automated exposure control, adjustment of the mA and/or kV according to patient size and/or use of iterative reconstruction technique. COMPARISON:  None Available. FINDINGS: CT HEAD FINDINGS Brain: No mass,hemorrhage or extra-axial collection. Normal appearance of the parenchyma and CSF spaces. Vascular: No hyperdense vessel or unexpected vascular calcification. Skull: The visualized skull base, calvarium and extracranial soft tissues are normal. Sinuses/Orbits: No fluid levels or advanced mucosal thickening of the visualized paranasal sinuses. No mastoid or middle ear effusion. Normal orbits. Other: None. CT CERVICAL SPINE FINDINGS Alignment: No static subluxation. Facets are aligned. Occipital condyles are normally positioned. Skull base  and vertebrae: No acute fracture. Soft tissues and spinal canal: No prevertebral fluid or swelling. No visible canal hematoma. Disc levels: No advanced spinal canal or neural foraminal stenosis. Upper chest: No pneumothorax, pulmonary nodule or pleural effusion. Other: Normal visualized paraspinal cervical soft tissues. IMPRESSION: 1. No acute intracranial abnormality. 2. No acute fracture or static subluxation of the cervical spine. Electronically Signed   By: Deatra Robinson M.D.   On: 06/03/2023 22:45   CT Cervical Spine Wo Contrast Result Date: 06/03/2023 CLINICAL DATA:  Falls EXAM: CT HEAD WITHOUT CONTRAST CT CERVICAL SPINE WITHOUT CONTRAST TECHNIQUE: Multidetector CT imaging of the head and cervical spine was performed following the standard protocol without intravenous  contrast. Multiplanar CT image reconstructions of the cervical spine were also generated. RADIATION DOSE REDUCTION: This exam was performed according to the departmental dose-optimization program which includes automated exposure control, adjustment of the mA and/or kV according to patient size and/or use of iterative reconstruction technique. COMPARISON:  None Available. FINDINGS: CT HEAD FINDINGS Brain: No mass,hemorrhage or extra-axial collection. Normal appearance of the parenchyma and CSF spaces. Vascular: No hyperdense vessel or unexpected vascular calcification. Skull: The visualized skull base, calvarium and extracranial soft tissues are normal. Sinuses/Orbits: No fluid levels or advanced mucosal thickening of the visualized paranasal sinuses. No mastoid or middle ear effusion. Normal orbits. Other: None. CT CERVICAL SPINE FINDINGS Alignment: No static subluxation. Facets are aligned. Occipital condyles are normally positioned. Skull base and vertebrae: No acute fracture. Soft tissues and spinal canal: No prevertebral fluid or swelling. No visible canal hematoma. Disc levels: No advanced spinal canal or neural foraminal stenosis. Upper chest: No pneumothorax, pulmonary nodule or pleural effusion. Other: Normal visualized paraspinal cervical soft tissues. IMPRESSION: 1. No acute intracranial abnormality. 2. No acute fracture or static subluxation of the cervical spine. Electronically Signed   By: Deatra Robinson M.D.   On: 06/03/2023 22:45    EKG: I independently viewed the EKG done and my findings are as followed: Junctional tachycardia rate of 115 bpm with LBBB and QTc of 630 ms  Assessment/Plan Present on Admission:  Abdominal pain  Acute midline low back pain without sciatica  Essential hypertension, benign  Mixed hyperlipidemia  Principal Problem:   Abdominal pain Active Problems:   Essential hypertension, benign   Mixed hyperlipidemia   Acute midline low back pain without sciatica    Recurrent falls   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)   Type 2 diabetes mellitus with hyperglycemia (HCC)   BPH (benign prostatic hyperplasia)  Abdominal pain Continue IV hydration Continue IV Dilaudid 0.5 mg q.3h p.r.n. for moderate to severe pain Continue IV Compazine p.r.n. RUQ ultrasound in the morning General Surgery was consulted to follow up with patient in the morning per EDP  Low back pain Recurrent falls CT lumbar spine without contrast showed no convincing acute lumbar fracture or focal pathological process.  Continue IV Dilaudid 0.5 mg every 3 hours as needed for moderate to severe pain Continue fall precaution Continue PT/OT eval and treat  Hypoalbuminemia secondary to moderate protein calorie malnutrition Albumin 2.3, protein supplement will be provided  Essential hypertension Continue amlodipine, Avapro  Type 2 diabetes mellitus Hemoglobin A1c from April 17, 2023 was 6.2% per medical record Continue ISS and hypoglycemia protocol Metformin will be held at this time  Mixed hyperlipidemia Continue Crestor  BPH Continue alfuzosin   DVT prophylaxis: Lovenox  Code Status: Full code  Family Communication: None at bedside  Consults: General Surgery  Severity of Illness: The  appropriate patient status for this patient is INPATIENT. Inpatient status is judged to be reasonable and necessary in order to provide the required intensity of service to ensure the patient's safety. The patient's presenting symptoms, physical exam findings, and initial radiographic and laboratory data in the context of their chronic comorbidities is felt to place them at high risk for further clinical deterioration. Furthermore, it is not anticipated that the patient will be medically stable for discharge from the hospital within 2 midnights of admission.   * I certify that at the point of admission it is my clinical judgment that the patient will require inpatient  hospital care spanning beyond 2 midnights from the point of admission due to high intensity of service, high risk for further deterioration and high frequency of surveillance required.*  Author: Frankey Shown, DO 06/04/2023 5:18 AM  For on call review [email or web address].

## 2023-06-03 NOTE — ED Triage Notes (Signed)
 Pt c/o lower back pain that has gotten worse. Pt is currently taking oxycodone for pain.

## 2023-06-03 NOTE — ED Provider Notes (Signed)
 [name] EMERGENCY DEPARTMENT AT [facility] Emergency Dept Provider Note   CSN: [id number] Arrival date & time: 06/03/23  2006     History  Chief Complaint  Patient presents with   Back Pain    Grant Bowers is a 78 y.o. male.  Patient complains of back pain and abdominal pain.  Patient has a history of hypertension diabetes and GERD.  He also has been having a lot of weakness.  The history is provided by the patient.  Abdominal Pain Pain location:  Generalized Pain quality: aching   Pain radiates to:  Does not radiate Pain severity:  Moderate Onset quality:  Sudden Timing:  Constant Progression:  Worsening Chronicity:  New Context: not alcohol use   Relieved by:  Nothing Worsened by:  Nothing Ineffective treatments:  None tried Associated symptoms: no chest pain, no cough, no diarrhea, no fatigue and no hematuria        Home Medications Prior to Admission medications   Medication Sig Start Date End Date Taking? Authorizing Provider  acetaminophen (TYLENOL) 325 MG tablet Take 650 mg by mouth every 6 (six) hours as needed for fever.    [provider]  Alcohol Swabs (B-D SINGLE USE SWABS REGULAR) PADS Use as directed once per day E11.9 12/29/20   [name], MD  alfuzosin (UROXATRAL) 10 MG 24 hr tablet Take 10 mg by mouth at bedtime. 12/08/21   [provider]  amLODipine (NORVASC) 10 MG tablet TAKE 1 TABLET EVERY DAY ANNUAL APPT DUE IN [place] MUST SEE PROVIDER FOR FUTURE REFILLS 04/09/23   [name], MD  Blood Glucose Calibration (TRUE METRIX LEVEL 1) Low SOLN Use as directed once per day E11.9 12/29/20   [name], MD  Blood Glucose Monitoring Suppl (TRUE METRIX METER) DEVI USE AS DIRECTED TO test blood sugar EVERY DAY 04/11/22   [provider]  cetirizine (ZYRTEC) 10 MG tablet TAKE 1 TABLET EVERY DAY 04/13/22   [name], MD  Chlorpheniramine-DM (CORICIDIN HBP COUGH/COLD PO) Take 1 tablet by mouth daily as needed  (cough/cold). Patient not taking: Reported on 05/21/2023    [provider]  Cholecalciferol (VITAMIN D3 SUPER STRENGTH) 50 MCG (2000 UT) TABS Take 2,000 Units by mouth daily.    [provider]  glucose blood (TRUE METRIX BLOOD GLUCOSE TEST) test strip TEST BLOOD SUGAR TWO TIMES DAILY AS INSTRUCTED 09/14/22   [name], MD  hydrochlorothiazide (MICROZIDE) 12.5 MG capsule TAKE 1 CAPSULE EVERY DAY 04/09/23   [name], MD  irbesartan (AVAPRO) 300 MG tablet TAKE 1 TABLET AT BEDTIME. ANNUAL APPT DUE IN [place]. MUST SEE PROVIDER FOR FUTURE REFILLS 04/26/23   [name], MD  metFORMIN (GLUCOPHAGE-XR) 500 MG 24 hr tablet TAKE 1 TABLET EVERY DAY WITH BREAKFAST 04/17/23   [name], MD  omeprazole (PRILOSEC) 10 MG capsule TAKE 1 CAPSULE EVERY DAY ANNUAL APPT DUE IN [place] MUST SEE PROVIDER FOR FUTURE REFILLS 04/09/23   [name], MD  oxyCODONE (ROXICODONE) 5 MG immediate release tablet Take 1 tablet (5 mg total) by mouth every 6 (six) hours as needed for severe pain (pain score 7-10). 05/29/23   [name], MD  predniSONE (DELTASONE) 20 MG tablet Take 40 mg by mouth daily. 05/20/23   [provider]  rosuvastatin (CRESTOR) 20 MG tablet TAKE 1 TABLET EVERY DAY 04/09/23   [name], MD  tolterodine (DETROL LA) 2 MG 24 hr capsule Take 2 mg by mouth  daily. 05/09/23   [provider]  TRUEplus Lancets 33G MISC Use as directed once per day E11.9 12/29/20   [name], MD  zinc gluconate 50 MG tablet Take 50 mg by mouth daily.    [provider]      Allergies    Patient has no known allergies.    Review of Systems   Review of Systems  Constitutional:  Negative for appetite change and fatigue.  HENT:  Negative for congestion, ear discharge and sinus pressure.   Eyes:  Negative for discharge.  Respiratory:  Negative for cough.   Cardiovascular:  Negative for chest pain.  Gastrointestinal:  Positive for abdominal pain. Negative for diarrhea.   Genitourinary:  Negative for frequency and hematuria.  Musculoskeletal:  Positive for back pain.  Skin:  Negative for rash.  Neurological:  Negative for seizures and headaches.  Psychiatric/Behavioral:  Negative for hallucinations.     Physical Exam Updated Vital Signs BP 135/64   Pulse (!) 107   Temp 97.9 F (36.6 C) (Oral)   Resp (!) 22   Ht 6' (1.829 m)   Wt 84 kg   SpO2 92%   BMI 25.12 kg/m  Physical Exam Vitals and nursing note reviewed.  Constitutional:      Appearance: He is well-developed.  HENT:     Head: Normocephalic.     Nose: Nose normal.  Eyes:     General: No scleral icterus.    Conjunctiva/sclera: Conjunctivae normal.  Neck:     Thyroid: No thyromegaly.  Cardiovascular:     Rate and Rhythm: Normal rate and regular rhythm.     Heart sounds: No murmur heard.    No friction rub. No gallop.  Pulmonary:     Breath sounds: No stridor. No wheezing or rales.  Chest:     Chest wall: No tenderness.  Abdominal:     General: There is no distension.     Tenderness: There is abdominal tenderness. There is no rebound.  Musculoskeletal:        General: Normal range of motion.     Cervical back: Neck supple.  Lymphadenopathy:     Cervical: No cervical adenopathy.  Skin:    Findings: No erythema or rash.  Neurological:     Mental Status: He is alert and oriented to person, place, and time.     Motor: No abnormal muscle tone.     Coordination: Coordination normal.  Psychiatric:        Behavior: Behavior normal.     ED Results / Procedures / Treatments   Labs (all labs ordered are listed, but only abnormal results are displayed) Labs Reviewed  CBC WITH DIFFERENTIAL/PLATELET - Abnormal; Notable for the following components:      Result Value   RBC 3.95 (*)    Hemoglobin 10.7 (*)    HCT 32.6 (*)    Neutro Abs 7.9 (*)    Lymphs Abs 0.5 (*)    All other components within normal limits  COMPREHENSIVE METABOLIC PANEL - Abnormal; Notable for the following  components:   Sodium 132 (*)    Chloride 97 (*)    Glucose, Bld 153 (*)    BUN 27 (*)    Calcium 8.4 (*)    Total Protein 5.6 (*)    Albumin 2.3 (*)    All other components within normal limits  URINALYSIS, ROUTINE W REFLEX MICROSCOPIC - Abnormal; Notable for the following components:   Ketones, ur 5 (*)  Protein, ur 30 (*)    All other components within normal limits  ETHANOL  RAPID URINE DRUG SCREEN, HOSP PERFORMED  I-STAT CHEM 8, ED    EKG EKG Interpretation Date/Time:  Sunday June 03 2023 21:31:22 EST Ventricular Rate:  115 PR Interval:    QRS Duration:  148 QT Interval:  455 QTC Calculation: 630 R Axis:   -37  Text Interpretation: Junctional tachycardia Left bundle branch block Confirmed by Bethann Berkshire [phone]) on 06/03/2023 9:58:58 PM  Radiology CT L-SPINE NO CHARGE Result Date: 06/03/2023 CLINICAL DATA:  Frequent falls, worsening back pain. EXAM: CT LUMBAR SPINE WITHOUT CONTRAST TECHNIQUE: Multidetector CT imaging of the lumbar spine was performed without intravenous contrast administration. Multiplanar CT image reconstructions were also generated. RADIATION DOSE REDUCTION: This exam was performed according to the departmental dose-optimization program which includes automated exposure control, adjustment of the mA and/or kV according to patient size and/or use of iterative reconstruction technique. COMPARISON:  CT abdomen and pelvis without and with contrast 12/15/2021 FINDINGS: Segmentation: 5 lumbar type vertebrae. Alignment: Chronic minimal discogenic degenerative grade 1 retrolisthesis L3-4, L4-5 and L5-S1. Slight levoscoliosis. No new, worsening or further alignment abnormality is seen. Vertebrae: Due to technique, the images are grainy with suboptimal spatial resolution. Accounting for this limitation no acute fracture is suspected or focal pathologic process. The vertebra are normal in heights. There are prominent marginal osteophytes, with attempted bridging  anteriorly at L2-3 and to the right anteriorly at L4-5 and L5-S1. Paraspinal and other soft tissues: Heavy aortoiliac calcific plaques. 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 Multiple partially visible Bosniak 1 right renal cysts. No follow-up imaging recommended. No paraspinal hematoma, mass or fluid collection. Disc levels: T11-12: The disc is degenerated. Small anterior osteophytes. No herniation or stenosis. T12-L1: The disc is normal in height. There is no bulge, herniation or stenosis. Trace facet spurring. L1-2: The disc is normal in height. There is a minimal nonstenosing dorsal disc bulge without herniation. The foramina are clear. Trace facet spurring. L2-3: The disc is normal in height. There is a mild diffuse annular bulge without herniation, spinal canal or foraminal stenosis. Mild facet joint spurring. L3-4: Mild-to-moderate disc space loss. Anterior and posterior osteophytes are present. Broad-based posterior disc osteophyte complex mildly encroaches on the lateral recesses and descending L4 nerve roots. There is only slight spinal canal stenosis. Mild facet spurring with unilateral mild right foraminal stenosis. L4-5: Mild disc space loss. There is circumferential disc osteophyte complex. Right paracentral shallow disc protrusion is seen with likely mass effect on the descending right L5 nerve root. There is mild-to-moderate spinal canal stenosis with hypertrophic dorsal ligamentous calcification contributing. Facet spurring with bilateral mild foraminal stenosis. L5-S1: Mild disc space loss. Partial ossification across the posterior disc space. Posterior endplate osteophytes with posterior disc osteophyte complex again noted. Shallow central disc protrusion is seen, with disc osteophyte complex and protrusion causing mild mass effect on the S1 nerve roots. No significant foraminal narrowing and only mild facet spurring. 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 The visualized sacrum is  intact. Unremarkable visualized posterior pelvis. IMPRESSION: 1. Suboptimal image quality, with no convincing acute lumbar fracture or focal pathologic process. 2. Scoliosis and degenerative change with multilevel disc osteophyte complexes, and protrusions at the lowest 2 levels. 3. Right paracentral shallow disc protrusion at L4-5 with likely mass effect on the descending right L5 nerve root. 4. Shallow central disc protrusion at L5-S1 with disc osteophyte complex and protrusion causing mild mass effect on the S1  nerve roots. 5. Multilevel facet spurring with mild foraminal stenosis. 6. Aortoiliac atherosclerosis. 7. 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 Recommend follow-up ultrasound every 5 years. Reference: Journal of Vascular Surgery 67.1 (2018): 2-77. J Am Coll Radiol [phone]. Aortic Atherosclerosis (ICD10-I70.0). Electronically Signed   By: Almira Bar M.D.   On: 06/03/2023 23:03   CT Head Wo Contrast Result Date: 06/03/2023 CLINICAL DATA:  Falls EXAM: CT HEAD WITHOUT CONTRAST CT CERVICAL SPINE WITHOUT CONTRAST TECHNIQUE: Multidetector CT imaging of the head and cervical spine was performed following the standard protocol without intravenous contrast. Multiplanar CT image reconstructions of the cervical spine were also generated. RADIATION DOSE REDUCTION: This exam was performed according to the departmental dose-optimization program which includes automated exposure control, adjustment of the mA and/or kV according to patient size and/or use of iterative reconstruction technique. COMPARISON:  None Available. FINDINGS: CT HEAD FINDINGS Brain: No mass,hemorrhage or extra-axial collection. Normal appearance of the parenchyma and CSF spaces. Vascular: No hyperdense vessel or unexpected vascular calcification. Skull: The visualized skull base, calvarium and extracranial soft tissues are normal. Sinuses/Orbits: No fluid levels or advanced mucosal thickening of the visualized paranasal sinuses. No  mastoid or middle ear effusion. Normal orbits. Other: None. CT CERVICAL SPINE FINDINGS Alignment: No static subluxation. Facets are aligned. Occipital condyles are normally positioned. Skull base and vertebrae: No acute fracture. Soft tissues and spinal canal: No prevertebral fluid or swelling. No visible canal hematoma. Disc levels: No advanced spinal canal or neural foraminal stenosis. Upper chest: No pneumothorax, pulmonary nodule or pleural effusion. Other: Normal visualized paraspinal cervical soft tissues. IMPRESSION: 1. No acute intracranial abnormality. 2. No acute fracture or static subluxation of the cervical spine. Electronically Signed   By: Deatra Robinson M.D.   On: 06/03/2023 22:45   CT Cervical Spine Wo Contrast Result Date: 06/03/2023 CLINICAL DATA:  Falls EXAM: CT HEAD WITHOUT CONTRAST CT CERVICAL SPINE WITHOUT CONTRAST TECHNIQUE: Multidetector CT imaging of the head and cervical spine was performed following the standard protocol without intravenous contrast. Multiplanar CT image reconstructions of the cervical spine were also generated. RADIATION DOSE REDUCTION: This exam was performed according to the departmental dose-optimization program which includes automated exposure control, adjustment of the mA and/or kV according to patient size and/or use of iterative reconstruction technique. COMPARISON:  None Available. FINDINGS: CT HEAD FINDINGS Brain: No mass,hemorrhage or extra-axial collection. Normal appearance of the parenchyma and CSF spaces. Vascular: No hyperdense vessel or unexpected vascular calcification. Skull: The visualized skull base, calvarium and extracranial soft tissues are normal. Sinuses/Orbits: No fluid levels or advanced mucosal thickening of the visualized paranasal sinuses. No mastoid or middle ear effusion. Normal orbits. Other: None. CT CERVICAL SPINE FINDINGS Alignment: No static subluxation. Facets are aligned. Occipital condyles are normally positioned. Skull base and  vertebrae: No acute fracture. Soft tissues and spinal canal: No prevertebral fluid or swelling. No visible canal hematoma. Disc levels: No advanced spinal canal or neural foraminal stenosis. Upper chest: No pneumothorax, pulmonary nodule or pleural effusion. Other: Normal visualized paraspinal cervical soft tissues. IMPRESSION: 1. No acute intracranial abnormality. 2. No acute fracture or static subluxation of the cervical spine. Electronically Signed   By: Deatra Robinson M.D.   On: 06/03/2023 22:45    Procedures Procedures    Medications Ordered in ED Medications  sodium chloride 0.9 % bolus 500 mL (500 mLs Intravenous New Bag/Given 06/03/23 2118)  iohexol (OMNIPAQUE) 350 MG/ML injection 80 mL (80 mLs Intravenous Contrast Given 06/03/23 2208)    ED Course/ Medical  Decision Making/ A&P  Patient having abdominal pain and back pain.  CT scan shows dilated gallbladder with possible cholecystitis.  I spoke with general surgery Dr.Pappayliou and she agrees with hospitalist admission.  Treat the pain but no antibiotics.  Patient will get ultrasound tomorrow and general surgery will consult tomorrow                               Medical Decision Making Amount and/or Complexity of Data Reviewed Labs: ordered. Radiology: ordered. ECG/medicine tests: ordered.  Risk Prescription drug management. Decision regarding hospitalization.   Abdominal pain and back pain.  Possible cholecystitis.  Patient will be admitted to medicine with general surgery consulting        Final Clinical Impression(s) / ED Diagnoses Final diagnoses:  Abdomen enlarged  Lower abdominal pain    Rx / DC Orders ED Discharge Orders     None         Bethann Berkshire, MD 06/10/23 1701

## 2023-06-04 ENCOUNTER — Inpatient Hospital Stay (HOSPITAL_COMMUNITY): Payer: Medicare HMO

## 2023-06-04 DIAGNOSIS — R296 Repeated falls: Secondary | ICD-10-CM | POA: Diagnosis not present

## 2023-06-04 DIAGNOSIS — I10 Essential (primary) hypertension: Secondary | ICD-10-CM | POA: Diagnosis not present

## 2023-06-04 DIAGNOSIS — M25551 Pain in right hip: Secondary | ICD-10-CM

## 2023-06-04 DIAGNOSIS — E46 Unspecified protein-calorie malnutrition: Secondary | ICD-10-CM | POA: Insufficient documentation

## 2023-06-04 DIAGNOSIS — R1010 Upper abdominal pain, unspecified: Secondary | ICD-10-CM

## 2023-06-04 DIAGNOSIS — K8020 Calculus of gallbladder without cholecystitis without obstruction: Secondary | ICD-10-CM | POA: Diagnosis not present

## 2023-06-04 DIAGNOSIS — E8809 Other disorders of plasma-protein metabolism, not elsewhere classified: Secondary | ICD-10-CM | POA: Diagnosis not present

## 2023-06-04 DIAGNOSIS — N400 Enlarged prostate without lower urinary tract symptoms: Secondary | ICD-10-CM | POA: Insufficient documentation

## 2023-06-04 DIAGNOSIS — E1165 Type 2 diabetes mellitus with hyperglycemia: Secondary | ICD-10-CM | POA: Insufficient documentation

## 2023-06-04 DIAGNOSIS — G4733 Obstructive sleep apnea (adult) (pediatric): Secondary | ICD-10-CM | POA: Insufficient documentation

## 2023-06-04 LAB — COMPREHENSIVE METABOLIC PANEL
ALT: 34 U/L (ref 0–44)
AST: 23 U/L (ref 15–41)
Albumin: 2.3 g/dL — ABNORMAL LOW (ref 3.5–5.0)
Alkaline Phosphatase: 82 U/L (ref 38–126)
Anion gap: 9 (ref 5–15)
BUN: 28 mg/dL — ABNORMAL HIGH (ref 8–23)
CO2: 22 mmol/L (ref 22–32)
Calcium: 8.2 mg/dL — ABNORMAL LOW (ref 8.9–10.3)
Chloride: 98 mmol/L (ref 98–111)
Creatinine, Ser: 1.09 mg/dL (ref 0.61–1.24)
GFR, Estimated: >60 mL/min (ref >60)
Glucose, Bld: 156 mg/dL — ABNORMAL HIGH (ref 70–99)
Potassium: 3.8 mmol/L (ref 3.5–5.1)
Sodium: 129 mmol/L — ABNORMAL LOW (ref 135–145)
Total Bilirubin: 1 mg/dL (ref 0.0–1.2)
Total Protein: 5.8 g/dL — ABNORMAL LOW (ref 6.5–8.1)

## 2023-06-04 LAB — CBC
HCT: 31.1 % — ABNORMAL LOW (ref 39.0–52.0)
Hemoglobin: 10.2 g/dL — ABNORMAL LOW (ref 13.0–17.0)
MCH: 26.8 pg (ref 26.0–34.0)
MCHC: 32.8 g/dL (ref 30.0–36.0)
MCV: 81.6 fL (ref 80.0–100.0)
Platelets: 293 10*3/uL (ref 150–400)
RBC: 3.81 MIL/uL — ABNORMAL LOW (ref 4.22–5.81)
RDW: 14 % (ref 11.5–15.5)
WBC: 10.8 10*3/uL — ABNORMAL HIGH (ref 4.0–10.5)
nRBC: 0 % (ref 0.0–0.2)

## 2023-06-04 LAB — GLUCOSE, CAPILLARY
Glucose-Capillary: 147 mg/dL — ABNORMAL HIGH (ref 70–99)
Glucose-Capillary: 174 mg/dL — ABNORMAL HIGH (ref 70–99)
Glucose-Capillary: 154 mg/dL — ABNORMAL HIGH (ref 70–99)
Glucose-Capillary: 167 mg/dL — ABNORMAL HIGH (ref 70–99)

## 2023-06-04 LAB — PHOSPHORUS: Phosphorus: 4.2 mg/dL (ref 2.5–4.6)

## 2023-06-04 LAB — MAGNESIUM: Magnesium: 1.9 mg/dL (ref 1.7–2.4)

## 2023-06-04 MED ORDER — SODIUM CHLORIDE 0.9 % IV SOLN: INTRAVENOUS | Status: AC

## 2023-06-04 MED ORDER — OXYCODONE HCL 5 MG PO TABS
5.0000 mg | ORAL_TABLET | ORAL | Status: DC | PRN
  Administered 2023-06-04 – 2023-06-05 (×4): 5 mg via ORAL
  Filled 2023-06-04 (×4): qty 1

## 2023-06-04 MED ORDER — AMLODIPINE BESYLATE 5 MG PO TABS
10.0000 mg | ORAL_TABLET | Freq: Every day | ORAL | Status: DC
  Administered 2023-06-04 – 2023-06-05 (×2): 10 mg via ORAL
  Filled 2023-06-04 (×2): qty 2

## 2023-06-04 MED ORDER — SENNOSIDES-DOCUSATE SODIUM 8.6-50 MG PO TABS
2.0000 | ORAL_TABLET | Freq: Every day | ORAL | Status: DC
  Administered 2023-06-04: 2 via ORAL
  Filled 2023-06-04: qty 2

## 2023-06-04 MED ORDER — ENOXAPARIN SODIUM 40 MG/0.4ML IJ SOSY
40.0000 mg | PREFILLED_SYRINGE | INTRAMUSCULAR | Status: DC
  Administered 2023-06-04 – 2023-06-05 (×2): 40 mg via SUBCUTANEOUS
  Filled 2023-06-04 (×2): qty 0.4

## 2023-06-04 MED ORDER — LACTULOSE 10 GM/15ML PO SOLN
30.0000 g | Freq: Once | ORAL | Status: AC
  Administered 2023-06-04: 30 g via ORAL
  Filled 2023-06-04: qty 60

## 2023-06-04 MED ORDER — INSULIN ASPART 100 UNIT/ML IJ SOLN
0.0000 [IU] | Freq: Three times a day (TID) | INTRAMUSCULAR | Status: DC
  Administered 2023-06-04 (×2): 2 [IU] via SUBCUTANEOUS
  Administered 2023-06-04: 3 [IU] via SUBCUTANEOUS
  Administered 2023-06-05: 2 [IU] via SUBCUTANEOUS
  Administered 2023-06-05: 3 [IU] via SUBCUTANEOUS

## 2023-06-04 MED ORDER — METHOCARBAMOL 500 MG PO TABS
500.0000 mg | ORAL_TABLET | Freq: Three times a day (TID) | ORAL | Status: DC
  Administered 2023-06-04 – 2023-06-05 (×4): 500 mg via ORAL
  Filled 2023-06-04 (×4): qty 1

## 2023-06-04 MED ORDER — LACTATED RINGERS IV SOLN: INTRAVENOUS | Status: AC

## 2023-06-04 MED ORDER — IRBESARTAN 150 MG PO TABS
300.0000 mg | ORAL_TABLET | Freq: Every day | ORAL | Status: DC
  Administered 2023-06-04 – 2023-06-05 (×2): 300 mg via ORAL
  Filled 2023-06-04 (×2): qty 2

## 2023-06-04 MED ORDER — ROSUVASTATIN CALCIUM 20 MG PO TABS
20.0000 mg | ORAL_TABLET | Freq: Every day | ORAL | Status: DC
  Administered 2023-06-04 – 2023-06-05 (×2): 20 mg via ORAL
  Filled 2023-06-04 (×2): qty 1

## 2023-06-04 MED ORDER — BISACODYL 10 MG RE SUPP
10.0000 mg | Freq: Once | RECTAL | Status: AC
  Administered 2023-06-04: 10 mg via RECTAL
  Filled 2023-06-04: qty 1

## 2023-06-04 MED ORDER — ACETAMINOPHEN 325 MG PO TABS
650.0000 mg | ORAL_TABLET | Freq: Four times a day (QID) | ORAL | Status: DC | PRN
  Administered 2023-06-04 (×2): 650 mg via ORAL
  Filled 2023-06-04 (×2): qty 2

## 2023-06-04 MED ORDER — PROCHLORPERAZINE EDISYLATE 10 MG/2ML IJ SOLN
10.0000 mg | Freq: Four times a day (QID) | INTRAMUSCULAR | Status: DC | PRN
  Administered 2023-06-04: 10 mg via INTRAVENOUS
  Filled 2023-06-04: qty 2

## 2023-06-04 MED ORDER — ACETAMINOPHEN 650 MG RE SUPP: 650.0000 mg | Freq: Four times a day (QID) | RECTAL | Status: DC | PRN

## 2023-06-04 MED ORDER — HYDROMORPHONE HCL 1 MG/ML IJ SOLN
1.0000 mg | INTRAMUSCULAR | Status: DC | PRN
  Administered 2023-06-04 – 2023-06-05 (×2): 1 mg via INTRAVENOUS
  Filled 2023-06-04 (×2): qty 1

## 2023-06-04 MED ORDER — POLYETHYLENE GLYCOL 3350 17 G PO PACK
17.0000 g | PACK | Freq: Two times a day (BID) | ORAL | Status: DC
  Administered 2023-06-04 – 2023-06-05 (×3): 17 g via ORAL
  Filled 2023-06-04 (×3): qty 1

## 2023-06-04 MED ORDER — HYDROMORPHONE HCL 1 MG/ML IJ SOLN
0.5000 mg | INTRAMUSCULAR | Status: DC | PRN
  Administered 2023-06-04 (×3): 0.5 mg via INTRAVENOUS
  Filled 2023-06-04 (×4): qty 0.5

## 2023-06-04 MED ORDER — ONDANSETRON HCL 4 MG/2ML IJ SOLN: 4.0000 mg | Freq: Four times a day (QID) | INTRAMUSCULAR | Status: DC | PRN

## 2023-06-04 MED ORDER — GLUCERNA SHAKE PO LIQD
237.0000 mL | Freq: Three times a day (TID) | ORAL | Status: DC
  Administered 2023-06-04 (×2): 237 mL via ORAL

## 2023-06-04 MED ORDER — ALFUZOSIN HCL ER 10 MG PO TB24
10.0000 mg | ORAL_TABLET | Freq: Every day | ORAL | Status: DC
  Administered 2023-06-04: 10 mg via ORAL
  Filled 2023-06-04: qty 1

## 2023-06-04 MED ORDER — ONDANSETRON HCL 4 MG PO TABS: 4.0000 mg | ORAL_TABLET | Freq: Four times a day (QID) | ORAL | Status: DC | PRN

## 2023-06-04 NOTE — Plan of Care (Signed)
   Problem: Education: Goal: Knowledge of General Education information will improve Description: Including pain rating scale, medication(s)/side effects and non-pharmacologic comfort measures Outcome: Progressing   Problem: Clinical Measurements: Goal: Ability to maintain clinical measurements within normal limits will improve Outcome: Progressing   Problem: Activity: Goal: Risk for activity intolerance will decrease Outcome: Not Progressing   Problem: Nutrition: Goal: Adequate nutrition will be maintained Outcome: Not Progressing

## 2023-06-04 NOTE — TOC Initial Note (Signed)
 Transition of Care [facility]) - Initial/Assessment Note    Patient Details  Name: Grant Bowers MRN: [id number] Date of Birth: 01-13-1946  [facility] of [facility]) CM/SW Contact:    Karn Cassis, LCSW Phone Number: 06/04/2023, 1:14 PM  Clinical Narrative:  Pt admitted due to abdominal pain. Pt reports he lives with his wife and is fairly independent with ADLs. PT evaluated pt and recommend HHPT. Discussed HHPT and pt agreeable, with no preference on agency. Referred and accepted by [facility]. Will need HHPT order. TOC will follow.                  Expected Discharge Plan: Home w Home Health Services Barriers to Discharge: Continued Medical Work up   Patient Goals and CMS Choice Patient states their goals for this hospitalization and ongoing recovery are:: return home   Choice offered to / list presented to : Patient [place] ownership interest in [facility].provided to::  (n/a)    Expected Discharge Plan and Services In-house Referral: Clinical Social Work   Post Acute Care Choice: Home Health Living arrangements for the past 2 months: Single Family Home                           HH Arranged: PT HH Agency: [facility] Date [facility] Agency Contacted: 06/04/23 Time HH Agency Contacted: [id number] Representative spoke with at [facility] Agency: Sarah  Prior Living Arrangements/Services Living arrangements for the past 2 months: Single Family Home Lives with:: Spouse Patient language and need for interpreter reviewed:: Yes Do you feel safe going back to the place where you live?: Yes      Need for Family Participation in Patient Care: No (Comment)   Current home services: DME ([facility]) Criminal Activity/Legal Involvement Pertinent to Current Situation/Hospitalization: No - Comment as needed  Activities of Daily Living      Permission Sought/Granted                  Emotional Assessment     Affect (typically observed):  Appropriate Orientation: : Oriented to Self, Oriented to Place, Oriented to  Time, Oriented to Situation Alcohol / Substance Use: Not Applicable Psych Involvement: No (comment)  Admission diagnosis:  Abdomen enlarged [R19.8] Lower abdominal pain [R10.30] Abdominal pain [R10.9] Patient Active Problem List   Diagnosis Date Noted   Recurrent falls 06/04/2023   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) 06/04/2023   Type 2 diabetes mellitus with hyperglycemia (HCC) 06/04/2023   BPH (benign prostatic hyperplasia) 06/04/2023   OSA on CPAP 06/04/2023   Abdominal pain 06/03/2023   Acute midline low back pain without sciatica 05/31/2023   Postviral syndrome 05/21/2023   Sinus tachycardia 05/21/2023   Vision loss, 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   Long term (current) use of oral hypoglycemic drugs 01/02/2023   CKD (chronic kidney disease) 04/25/2021   Diabetes (HCC) 03/19/2021   Trigger thumb of left hand 02/22/2021   Vitamin D deficiency 11/16/2020   Bradycardia 10/12/2020   Cardiac pacemaker - STJ 10/12/2020   Allergic rhinitis 10/31/2018   Paresthesias 08/08/2016   Mixed hyperlipidemia 09/26/2015   Carotid artery stenosis 09/23/2015   Abnormal blood chemistry 09/18/2014   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   Vertigo 09/13/2011   Encounter for well adult exam with abnormal findings 03/11/2011   Nonspecific abnormal results of cardiovascular function study 02/15/2010   RENAL CALCULUS 02/11/2010   ELECTROCARDIOGRAM, ABNORMAL 02/11/2010   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   Essential hypertension,  benign 04/08/2008   GERD 04/08/2008   PSA, INCREASED 04/08/2008   NEPHROLITHIASIS, HX OF 04/08/2008   PCP:  [name], MD Pharmacy:   [facility] - [place], [place] - [address] [place] [address] Phone: [phone] Fax: [phone]  [facility] - [address], [place] - [address] [place] [address] Phone:  [phone] Fax: [phone]  [facility] (Now [facility]) - [address] [place], [place] - [address] [place] [address] Phone: [phone] Fax: [phone]     Social Drivers of Health (SDOH) Social History: SDOH Screenings   Food Insecurity: No Food Insecurity (06/04/2023)  Housing: Low Risk  (06/04/2023)  Transportation Needs: No Transportation Needs (06/04/2023)  Utilities: Not At Risk (06/04/2023)  Depression (PHQ2-9): Low Risk  (04/17/2023)  Social Connections: Unknown (06/04/2023)  Tobacco Use: Low Risk  (06/03/2023)   SDOH Interventions:     Readmission Risk Interventions     No data to display

## 2023-06-04 NOTE — Progress Notes (Signed)
 PROGRESS NOTE  Grant Bowers, is a 78 y.o. male, DOB - [birth year], ZOX:[id number]  Admit date - 06/03/2023   Admitting Physician Frankey Shown, DO  Outpatient Primary MD for the patient is [name], MD  LOS - 1  Chief Complaint  Patient presents with   Back Pain      Brief Narrative:  78 y.o. 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[employer])--admitted on 05/31/2023 with generalized weakness, low back pain, abdominal pain and falls with concerns about possible acute cholecystitis requiring further workup   -Assessment and Plan: 1)Cholelithiasis without acute Cholecystitis--- clinical and imaging studies (CT AP and RUQ) consistent with cholelithiasis without acute cholecystitis -LFTs are Not elevated -General Surgery consult appreciated -Symptomatic and supportive treatment for now -Low-fat diet advised -As needed antiemetics and as needed pain meds  2)Hyponatremia/Dehydration--due to dehydration in the setting of poor oral intake -BUN/creatinine ratio 28 -- Hydrate and recheck  3)Adult failure to thrive/moderate protein caloric malnutrition-- -Serum albumin is 2.3 -Increase oral intake and supplements advised  4)DM2--- A1c 6.2 reflecting excellent diabetic control PTA- Hold Metformin Use Novolog/Humalog Sliding scale insulin with Accu-Cheks/Fingersticks as ordered   5)HTN--continue Amlodipine and Avapro  6)BPH--- continue alfuzosin  7)HLD--continue Crestor   8)Generalized Weakness and Recurrent Falls/?? Syncope---  -CT lumbar spine without contrast showed no convincing acute lumbar fracture or focal pathological process.  No acute fracture or static subluxation of the cervical spine  CT head w/o acute findings -Carotid Dopplers requested Phy Therapy eval appreciated recommend home PT  9)Low Back Pain--- physical therapy eval appreciated recommends home  health PT -Methocarbamol and as needed pain meds as ordered  10)Constipation--- anticipate worsening constipation with opiates for pain control  -Give MiraLAX during the day and Senokot nightly  Status is: Inpatient   Disposition: The patient is from: Home              Anticipated d/c is to: Home with [facility]              Anticipated d/c date is: 1 day              Patient currently is not medically stable to d/c. Barriers: Not Clinically Stable-   Code Status :  -  Code Status: Full Code   Family Communication:    (patient is alert, awake and coherent)  -  DVT Prophylaxis  :   - SCDs  enoxaparin (LOVENOX) injection 40 mg Start: 06/04/23 1000 SCDs Start: 06/04/23 0105   Lab Results  Component Value Date   PLT 293 06/04/2023    Inpatient Medications  Scheduled Meds:  alfuzosin  10 mg Oral QHS   amLODipine  10 mg Oral Daily   enoxaparin (LOVENOX) injection  40 mg Subcutaneous Q24H   feeding supplement (GLUCERNA SHAKE)  237 mL Oral TID BM   insulin aspart  0-15 Units Subcutaneous TID WC   irbesartan  300 mg Oral Daily   methocarbamol  500 mg Oral TID   polyethylene glycol  17 g Oral BID   rosuvastatin  20 mg Oral Daily   senna-docusate  2 tablet Oral QHS   Continuous Infusions: PRN Meds:.acetaminophen **OR** acetaminophen, HYDROmorphone (DILAUDID) injection, oxyCODONE, prochlorperazine   Anti-infectives (From admission, onward)    None         Subjective: [name] today has no fevers, no emesis,  No chest pain,   -  No BM yet Abd pain is not worse - Has generalized weakness and fatigue C/o Rt Hip discomfort  -Wife reports episodes of confusion and occasional disorientation   Objective: Vitals:   06/04/23 0132 06/04/23 0257 06/04/23 0529 06/04/23 0853  BP: 104/68 127/64 131/68 120/84  Pulse: (!) 112 100 (!) 109 (!) 110  Resp: (!) 24 (!) 21 20 20   Temp: 99.2 F (37.3 C) 98.1 F (36.7 C) 98.2 F (36.8 C) 98.3 F (36.8 C)  TempSrc: Axillary  Oral Oral Oral  SpO2: 95% 96% 95%   Weight: 80.4 kg     Height:        Intake/Output Summary (Last 24 hours) at 06/04/2023 1509 Last data filed at 06/04/2023 0900 Gross per 24 hour  Intake 343.08 ml  Output 350 ml  Net -6.92 ml   Filed Weights   06/03/23 2013 06/04/23 0132  Weight: 84 kg 80.4 kg    Physical Exam  Gen:- Awake Alert, in no apparent distress  HEENT:- [email or web address].AT, No sclera icterus Neck-Supple Neck,No JVD, +ve Rt sided Carotid Bruit Lungs-  CTAB , fair symmetrical air movement CV- S1, S2 normal, regular  Abd-  +ve B.Sounds, Abd Soft, No significant tenderness,    Extremity/Skin:- No  edema, pedal pulses present , mild tenderness over the right hip on palpation, no obvious deformity Psych-affect is appropriate, oriented x3 Neuro-Generalized weakness, no new focal deficits, no tremors  Data Reviewed: I have personally reviewed following labs and imaging studies  CBC: Recent Labs  Lab 06/03/23 2153 06/04/23 0512  WBC 9.0 10.8*  NEUTROABS 7.9*  --   HGB 10.7* 10.2*  HCT 32.6* 31.1*  MCV 82.5 81.6  PLT 265 293   Basic Metabolic Panel: Recent Labs  Lab 06/03/23 2153 06/04/23 0512  NA 132* 129*  K 3.8 3.8  CL 97* 98  CO2 22 22  GLUCOSE 153* 156*  BUN 27* 28*  CREATININE 1.04 1.09  CALCIUM 8.4* 8.2*  MG  --  1.9  PHOS  --  4.2   GFR: Estimated Creatinine Clearance: 61.3 mL/min (by C-G formula based on SCr of 1.09 mg/dL). Liver Function Tests: Recent Labs  Lab 06/03/23 2153 06/04/23 0512  AST 24 23  ALT 37 34  ALKPHOS 88 82  BILITOT 0.9 1.0  PROT 5.6* 5.8*  ALBUMIN 2.3* 2.3*   Cardiac Enzymes: No results for input(s): "CKTOTAL", "CKMB", "CKMBINDEX", "TROPONINI" in the last 168 hours. BNP (last 3 results) No results for input(s): "PROBNP" in the last 8760 hours. HbA1C: No results for input(s): "HGBA1C" in the last 72 hours. Sepsis Labs: @LABRCNTIP (procalcitonin:4,lacticidven:4) )No results found for this or any previous visit (from the past  240 hours).    Radiology Studies: US Abdomen Limited Result Date: 06/04/2023 CLINICAL DATA:  Abdominal pain EXAM: ULTRASOUND ABDOMEN LIMITED RIGHT UPPER QUADRANT COMPARISON:  CT 06/03/2023. FINDINGS: Gallbladder: Dilated gallbladder. Dependent stones. Is also some non dependent echogenic areas which could resent small polyps measuring up to 4 mm. No adjacent fluid or wall thickening. No reported sonographic Murphy's sign. Common bile duct: Diameter: 4 mm Liver: No focal lesion identified. Within normal limits in parenchymal echogenicity. Portal vein is patent on color Doppler imaging with normal direction of blood flow towards the liver. Other: Trace right pleural fluid. Small right-sided renal cystic focus identified as seen on prior CT. IMPRESSION: Distended gallbladder with stones and small polyps. No wall thickening, adjacent fluid or ductal dilatation. No additional sonographic evidence of acute cholecystitis. Trace right pleural fluid. Please correlate with  prior CT Electronically Signed   By: Karen Kays M.D.   On: 06/04/2023 12:34   CT Angio Chest/Abd/Pel for Dissection W and/or Wo Contrast Result Date: 06/03/2023 CLINICAL DATA:  Head and neck trauma, low back pain and multiple frequent falls. Evaluating for acute aortic syndrome. EXAM: CT ANGIOGRAPHY CHEST, ABDOMEN AND PELVIS TECHNIQUE: A noncontrast chest CT study was initially performed to look for aortic hematoma. Multidetector CT imaging through the chest, abdomen and pelvis was performed using the standard protocol during bolus administration of intravenous contrast. Multiplanar reconstructed images and MIPs were obtained and reviewed to evaluate the vascular anatomy. RADIATION DOSE REDUCTION: This exam was performed according to the departmental dose-optimization program which includes automated exposure control, adjustment of the mA and/or kV according to patient size and/or use of iterative reconstruction technique. CONTRAST:  80mL  OMNIPAQUE IOHEXOL 350 MG/ML SOLN COMPARISON:  CTA chest and PA and lateral chest both 04/26/2023, CT abdomen pelvis without and with contrast 12/16/2018. FINDINGS: CTA CHEST FINDINGS Cardiovascular: There is metallic artifact from a left chest dual lead pacing system and wires in the right heart. The pulmonary arteries are normal in caliber, centrally clear on this nondedicated exam. There is preferential opacification of the aorta and great vessels. There is no aneurysm, dissection or stenosis. There are moderate to heavy aortic calcific plaques, occasional calcifications in the great vessels. There is a normal variant brachiobicarotid trunk and normal variant origin of the left vertebral artery from the aortic arch. The cardiac size is normal. There is interval increased pericardial effusion now 1 cm in thickness, previously 7 mm. Scattered calcific plaque left main, lad and circumflex coronary arteries. No venous dilatation. Mediastinum/Nodes: Few slightly frontal bilateral hilar nodes up to 1.1 cm in short axis. No mediastinal or axillary adenopathy. Thyroid gland is unremarkable. The thoracic trachea, thoracic esophagus, both main bronchi unremarkable. Lungs/Pleura: There are bilateral minimal layering pleural effusions, both slightly increased since 04/26/2023. Again noted linear scarring or atelectasis in both lung bases. Mild fissural atelectasis in the left upper lobe. There is no consolidation or pneumothorax. No pulmonary nodules are seen. Musculoskeletal: Extensive bridging enthesopathy of the thoracic spine beginning at T4. Degenerative change and mild thoracic dextroscoliosis. No acute or other significant osseous findings.  No chest wall mass. Review of the MIP images confirms the above findings. CTA ABDOMEN AND PELVIS FINDINGS VASCULAR Aorta: Moderate to heavy aortoiliac calcific plaques. No stenosis. 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 Celiac:  Patent without evidence of aneurysm, dissection, vasculitis or significant stenosis. Scattered branch calcific plaques splenic artery. No branch occlusions. Nonstenosing ostial calcific plaques. SMA: 50-60% calcific origin stenosis. The vessel otherwise opacifies well without visible branch occlusion. Minimal calcific plaque past the inflection point of the vessel. Renals: 2 roughly codominant arteries supplying each kidney. Both upper pole arteries demonstrate approximately 50% calcific origin stenosis. The right lower pole artery appears to have at least a 75 % calcific origin stenosis. The left lower pole artery is patent. IMA: Patent without evidence of aneurysm, dissection, vasculitis or significant stenosis. Inflow: Patent without evidence of aneurysm, dissection, vasculitis or significant stenosis. There are mild-to-moderate calcific plaques. Veins: No obvious venous abnormality within the limitations of this arterial phase study. Review of the MIP images confirms the above findings. NON-VASCULAR Hepatobiliary: Small scattered hepatic cysts and a few too small to characterize hepatic hypodensities, unchanged. No follow-up imaging recommended. Largest cyst is 1.7 cm and 11 Hounsfield units in the dome of segment 8. Gallbladder  dilated to nearly 11 cm. There is a subcentimeter stone in the proximal lumen. On some of the axial images there is suspicion of pericholecystic edema and slight gallbladder wall thickening which may be seen with cholecystitis. Consider ultrasound follow-up for further study. No biliary dilatation. Pancreas: No abnormality. Spleen: Increased splenomegaly, the current AP splenic axis 17 cm previously 15.5 cm. No mass. Adrenals/Urinary Tract: There is no adrenal mass. Bilateral Bosniak 1 renal cysts are again seen, largest is a right parapelvic cyst measuring 4.6 cm and 15 Hounsfield units. No follow-up imaging is recommended. There is cortical thinning in both kidneys, scattered cortical  scarring of the inferior poles. There previously was a 6 mm right ureteral stone which has passed. There is no hydronephrosis or appreciable urinary stones at present. No mass enhancement. The bladder is unremarkable. Stomach/Bowel: No dilatation or wall thickening including the appendix. Moderate fecal stasis. Sigmoid diverticulosis with no evidence of diverticulitis. Lymphatic: No appreciable adenopathy. Reproductive: 5.5 cm enlarged prostate. Scattered dystrophic prostatic calcifications. There is a mild posterior bladder impression. Right testicle appears to be retracted into the inguinal canal. Other: Small umbilical and inguinal fat hernias. No free hemorrhage, free fluid or free air, or localizing collections. Musculoskeletal: Degenerative change and slight levoscoliosis lumbar spine. Ankylosed SI joints. Mild hip DJD. No concerning regional bone lesion. Lumbar spine CT is dictated separately. Review of the MIP images confirms the above findings. IMPRESSION: 1. No acute trauma related findings in the chest, abdomen or pelvis. 2. Aortoiliac and coronary artery atherosclerosis. No dissection or aortic stenosis. 3. 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 Recommend surveillance ultrasound in 5 years. Reference: Journal of Vascular Surgery 67.1 (2018): 2-77. J Am Coll Radiol 2013;10:789-794. 4. 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 5. 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 6. Dilated gallbladder with stone and possible pericholecystic edema and slight wall thickening. Consider ultrasound follow-up to assess for cholecystitis. 7. Increased splenomegaly. 8. Constipation and diverticulosis. 9. Prostatomegaly with mild posterior bladder impression. Right testicle appears retracted into the inguinal canal. 10. Umbilical and inguinal fat hernias. 11. Increased pericardial effusion now 1 cm in thickness, previously 7 mm. 12. Slightly increased  bilateral pleural effusions. 13. Critical Value/emergent results were called by telephone at the time of interpretation on 06/03/2023 at 11:29 pm to provider JOSEPH ZAMMIT , who verbally acknowledged these results. Aortic Atherosclerosis (ICD10-I70.0). Electronically Signed   By: Almira Bar M.D.   On: 06/03/2023 23:47   CT L-SPINE NO CHARGE Result Date: 06/03/2023 CLINICAL DATA:  Frequent falls, worsening back pain. EXAM: CT LUMBAR SPINE WITHOUT CONTRAST TECHNIQUE: Multidetector CT imaging of the lumbar spine was performed without intravenous contrast administration. Multiplanar CT image reconstructions were also generated. RADIATION DOSE REDUCTION: This exam was performed according to the departmental dose-optimization program which includes automated exposure control, adjustment of the mA and/or kV according to patient size and/or use of iterative reconstruction technique. COMPARISON:  CT abdomen and pelvis without and with contrast 12/15/2021 FINDINGS: Segmentation: 5 lumbar type vertebrae. Alignment: Chronic minimal discogenic degenerative grade 1 retrolisthesis L3-4, L4-5 and L5-S1. Slight levoscoliosis. No new, worsening or further alignment abnormality is seen. Vertebrae: Due to technique, the images are grainy with suboptimal spatial resolution. Accounting for this limitation no acute fracture is suspected or focal pathologic process. The vertebra are normal in heights. There are prominent marginal osteophytes, with attempted bridging anteriorly at L2-3 and to the right anteriorly at L4-5 and L5-S1. Paraspinal and other  soft tissues: Heavy aortoiliac calcific plaques. 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 Multiple partially visible Bosniak 1 right renal cysts. No follow-up imaging recommended. No paraspinal hematoma, mass or fluid collection. Disc levels: T11-12: The disc is degenerated. Small anterior osteophytes. No herniation or stenosis. T12-L1: The disc is normal in height. There is no bulge,  herniation or stenosis. Trace facet spurring. L1-2: The disc is normal in height. There is a minimal nonstenosing dorsal disc bulge without herniation. The foramina are clear. Trace facet spurring. L2-3: The disc is normal in height. There is a mild diffuse annular bulge without herniation, spinal canal or foraminal stenosis. Mild facet joint spurring. L3-4: Mild-to-moderate disc space loss. Anterior and posterior osteophytes are present. Broad-based posterior disc osteophyte complex mildly encroaches on the lateral recesses and descending L4 nerve roots. There is only slight spinal canal stenosis. Mild facet spurring with unilateral mild right foraminal stenosis. L4-5: Mild disc space loss. There is circumferential disc osteophyte complex. Right paracentral shallow disc protrusion is seen with likely mass effect on the descending right L5 nerve root. There is mild-to-moderate spinal canal stenosis with hypertrophic dorsal ligamentous calcification contributing. Facet spurring with bilateral mild foraminal stenosis. L5-S1: Mild disc space loss. Partial ossification across the posterior disc space. Posterior endplate osteophytes with posterior disc osteophyte complex again noted. Shallow central disc protrusion is seen, with disc osteophyte complex and protrusion causing mild mass effect on the S1 nerve roots. No significant foraminal narrowing and only mild facet spurring. 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 The visualized sacrum is intact. Unremarkable visualized posterior pelvis. IMPRESSION: 1. Suboptimal image quality, with no convincing acute lumbar fracture or focal pathologic process. 2. Scoliosis and degenerative change with multilevel disc osteophyte complexes, and protrusions at the lowest 2 levels. 3. Right paracentral shallow disc protrusion at L4-5 with likely mass effect on the descending right L5 nerve root. 4. Shallow central disc protrusion at L5-S1 with disc osteophyte complex and  protrusion causing mild mass effect on the S1 nerve roots. 5. Multilevel facet spurring with mild foraminal stenosis. 6. Aortoiliac atherosclerosis. 7. 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 Recommend follow-up ultrasound every 5 years. Reference: Journal of Vascular Surgery 67.1 (2018): 2-77. J Am Coll Radiol [phone]. Aortic Atherosclerosis (ICD10-I70.0). Electronically Signed   By: Almira Bar M.D.   On: 06/03/2023 23:03   CT Head Wo Contrast Result Date: 06/03/2023 CLINICAL DATA:  Falls EXAM: CT HEAD WITHOUT CONTRAST CT CERVICAL SPINE WITHOUT CONTRAST TECHNIQUE: Multidetector CT imaging of the head and cervical spine was performed following the standard protocol without intravenous contrast. Multiplanar CT image reconstructions of the cervical spine were also generated. RADIATION DOSE REDUCTION: This exam was performed according to the departmental dose-optimization program which includes automated exposure control, adjustment of the mA and/or kV according to patient size and/or use of iterative reconstruction technique. COMPARISON:  None Available. FINDINGS: CT HEAD FINDINGS Brain: No mass,hemorrhage or extra-axial collection. Normal appearance of the parenchyma and CSF spaces. Vascular: No hyperdense vessel or unexpected vascular calcification. Skull: The visualized skull base, calvarium and extracranial soft tissues are normal. Sinuses/Orbits: No fluid levels or advanced mucosal thickening of the visualized paranasal sinuses. No mastoid or middle ear effusion. Normal orbits. Other: None. CT CERVICAL SPINE FINDINGS Alignment: No static subluxation. Facets are aligned. Occipital condyles are normally positioned. Skull base and vertebrae: No acute fracture. Soft tissues and spinal canal: No prevertebral fluid or swelling. No visible canal hematoma. Disc levels: No advanced spinal canal or neural foraminal stenosis. Upper  chest: No pneumothorax, pulmonary nodule or pleural effusion. Other: Normal  visualized paraspinal cervical soft tissues. IMPRESSION: 1. No acute intracranial abnormality. 2. No acute fracture or static subluxation of the cervical spine. Electronically Signed   By: Deatra Robinson M.D.   On: 06/03/2023 22:45   CT Cervical Spine Wo Contrast Result Date: 06/03/2023 CLINICAL DATA:  Falls EXAM: CT HEAD WITHOUT CONTRAST CT CERVICAL SPINE WITHOUT CONTRAST TECHNIQUE: Multidetector CT imaging of the head and cervical spine was performed following the standard protocol without intravenous contrast. Multiplanar CT image reconstructions of the cervical spine were also generated. RADIATION DOSE REDUCTION: This exam was performed according to the departmental dose-optimization program which includes automated exposure control, adjustment of the mA and/or kV according to patient size and/or use of iterative reconstruction technique. COMPARISON:  None Available. FINDINGS: CT HEAD FINDINGS Brain: No mass,hemorrhage or extra-axial collection. Normal appearance of the parenchyma and CSF spaces. Vascular: No hyperdense vessel or unexpected vascular calcification. Skull: The visualized skull base, calvarium and extracranial soft tissues are normal. Sinuses/Orbits: No fluid levels or advanced mucosal thickening of the visualized paranasal sinuses. No mastoid or middle ear effusion. Normal orbits. Other: None. CT CERVICAL SPINE FINDINGS Alignment: No static subluxation. Facets are aligned. Occipital condyles are normally positioned. Skull base and vertebrae: No acute fracture. Soft tissues and spinal canal: No prevertebral fluid or swelling. No visible canal hematoma. Disc levels: No advanced spinal canal or neural foraminal stenosis. Upper chest: No pneumothorax, pulmonary nodule or pleural effusion. Other: Normal visualized paraspinal cervical soft tissues. IMPRESSION: 1. No acute intracranial abnormality. 2. No acute fracture or static subluxation of the cervical spine. Electronically Signed   By: Deatra Robinson M.D.   On: 06/03/2023 22:45   Scheduled Meds:  alfuzosin  10 mg Oral QHS   amLODipine  10 mg Oral Daily   enoxaparin (LOVENOX) injection  40 mg Subcutaneous Q24H   feeding supplement (GLUCERNA SHAKE)  237 mL Oral TID BM   insulin aspart  0-15 Units Subcutaneous TID WC   irbesartan  300 mg Oral Daily   methocarbamol  500 mg Oral TID   polyethylene glycol  17 g Oral BID   rosuvastatin  20 mg Oral Daily   senna-docusate  2 tablet Oral QHS   Continuous Infusions:   LOS: 1 day    Shon Hale M.D on 06/04/2023 at 3:09 PM  Go to [email or web address] - for contact info  [facility] - Office  [phone]  If 7PM-7AM, please contact night-coverage [email or web address] 06/04/2023, 3:09 PM

## 2023-06-04 NOTE — Plan of Care (Signed)
  Problem: Acute Rehab PT Goals(only PT should resolve) Goal: Pt Will Go Supine/Side To Sit Flowsheets (Taken 06/04/2023 1411) Pt will go Supine/Side to Sit:  with contact guard assist  with minimal assist Goal: Patient Will Perform Sitting Balance Flowsheets (Taken 06/04/2023 1411) Patient will perform sitting balance: with contact guard assist Goal: Patient Will Transfer Sit To/From Stand Flowsheets (Taken 06/04/2023 1411) Patient will transfer sit to/from stand: with contact guard assist Goal: Pt Will Perform Standing Balance Or Pre-Gait Flowsheets (Taken 06/04/2023 1411) Pt will perform standing balance or pre-gait:  with Supervision  with contact guard assist   Problem: Acute Rehab PT Goals(only PT should resolve) Goal: Pt Will Ambulate Flowsheets (Taken 06/04/2023 1412) Pt will Ambulate:  75 feet  with contact guard assist  with rolling walker   2:12 PM, 06/04/23 Grant Bowers, MPT Physical Therapist with [facility] [phone] office [phone] mobile phone

## 2023-06-04 NOTE — Plan of Care (Signed)
  Problem: Skin Integrity: Goal: Risk for impaired skin integrity will decrease Outcome: Progressing   

## 2023-06-04 NOTE — Progress Notes (Signed)
 Pt very restless, moaning, and groaning in pain with very little relief from PRN dilaudid. He c/o pain in groin, pelvic region, lower back, and hips. No acute events overnight. Kellogg RN

## 2023-06-04 NOTE — Evaluation (Signed)
 Physical Therapy Evaluation Patient Details Name: Grant Bowers MRN: [id number] DOB: Jan 07, 1946 Today's Date: 06/04/2023  History of Present Illness  Claudie Rathbone is a 78 y.o. male with medical history significant of type 2 diabetes mellitus, essential hypertension, mixed hyperlipidemia, low back pain (on oxycodone at home.), trifascicular block s/p PPM,  OSA on CPAP, GERD who presents to the ED emergency department due to worsening low back pain.  Patient.endorsed 3 episodes of fall within last few days.  Pain was right-sided low back pain with radiation into the groin, it was severe and was without any elevating/aggravating factor.  Patient also complained of right upper quadrant pain which was moderate in intensity.  He denies chest pain and shortness of breath.   Clinical Impression  Patient had most difficulty sitting up at bedside due to increasing Right hip pain, tends to flop into chair during transfers and tolerated walking out of room, but limited due to fatigue and right hip pain.  Patient tolerated sitting up in chair after therapy with his spouse present. Patient will benefit from continued skilled physical therapy in hospital and recommended venue below to increase strength, balance, endurance for safe ADLs and gait.          If plan is discharge home, recommend the following: A lot of help with walking and/or transfers;A lot of help with bathing/dressing/bathroom;Help with stairs or ramp for entrance;Assistance with cooking/housework   Can travel by private vehicle        Equipment Recommendations None recommended by PT  Recommendations for Other Services       Functional Status Assessment Patient has had a recent decline in their functional status and demonstrates the ability to make significant improvements in function in a reasonable and predictable amount of time.     Precautions / Restrictions Precautions Precautions: Fall Restrictions Weight Bearing Restrictions  Per Provider Order: No      Mobility  Bed Mobility Overal bed mobility: Needs Assistance Bed Mobility: Supine to Sit     Supine to sit: Min assist, Mod assist     General bed mobility comments: increased time, labored movement, increased right hip pain    Transfers Overall transfer level: Needs assistance Equipment used: Rolling walker (2 wheels) Transfers: Sit to/from Stand, Bed to chair/wheelchair/BSC Sit to Stand: Min assist, Mod assist   Step pivot transfers: Min assist, Mod assist       General transfer comment: unsteady labored movement with c/o inceasing right hip pain    Ambulation/Gait Ambulation/Gait assistance: Min assist, Mod assist Gait Distance (Feet): 35 Feet Assistive device: Rolling walker (2 wheels) Gait Pattern/deviations: Decreased step length - right, Decreased step length - left, Decreased stride length Gait velocity: decreased     General Gait Details: slow labored unsteady cadence requiring use RW due to poor standing balance, limited mostly due to increasing right hip pain  Stairs            Wheelchair Mobility     Tilt Bed    Modified Rankin (Stroke Patients Only)       Balance Overall balance assessment: Needs assistance Sitting-balance support: Feet supported, No upper extremity supported Sitting balance-Leahy Scale: Fair Sitting balance - Comments: seated at EOB   Standing balance support: Reliant on assistive device for balance, During functional activity, Bilateral upper extremity supported Standing balance-Leahy Scale: Poor Standing balance comment: fair/poor using RW  Pertinent Vitals/Pain Pain Assessment Pain Assessment: 0-10 Pain Score: 6  Pain Location: mostly right hip Pain Descriptors / Indicators: Grimacing, Sharp, Sore, Shooting Pain Intervention(s): Limited activity within patient's tolerance, Monitored during session, Premedicated before session    Home Living  Family/patient expects to be discharged to:: Private residence Living Arrangements: Spouse/significant other Available Help at Discharge: Family;Available 24 hours/day Type of Home: House Home Access: Stairs to enter Entrance Stairs-Rails: Right;Left;Can reach both Entrance Stairs-Number of Steps: 2   Home Layout: One level Home Equipment: Agricultural consultant (2 wheels);Cane - single point;Shower seat;BSC/3in1;Grab bars - tub/shower      Prior Function Prior Level of Function : Independent/Modified Independent;Driving             Mobility Comments: Communotu ambulation without AD ADLs Comments: Assisted by spouse     Extremity/Trunk Assessment   Upper Extremity Assessment Upper Extremity Assessment: Defer to OT evaluation    Lower Extremity Assessment Lower Extremity Assessment: Generalized weakness    Cervical / Trunk Assessment Cervical / Trunk Assessment: Normal  Communication   Communication Communication: No apparent difficulties    Cognition Arousal: Alert Behavior During Therapy: WFL for tasks assessed/performed   PT - Cognitive impairments: No apparent impairments                         Following commands: Intact       Cueing Cueing Techniques: Verbal cues, Tactile cues     General Comments      Exercises     Assessment/Plan    PT Assessment Patient needs continued PT services  PT Problem List Decreased strength;Decreased activity tolerance;Decreased balance;Decreased mobility;Pain       PT Treatment Interventions DME instruction;Gait training;Stair training;Functional mobility training;Therapeutic activities;Therapeutic exercise;Balance training;Patient/family education    PT Goals (Current goals can be found in the Care Plan section)  Acute Rehab PT Goals Patient Stated Goal: return home PT Goal Formulation: With patient/family Time For Goal Achievement: 06/08/23 Potential to Achieve Goals: Good    Frequency Min 3X/week      Co-evaluation               AM-PAC PT "6 Clicks" Mobility  Outcome Measure Help needed turning from your back to your side while in a flat bed without using bedrails?: A Lot Help needed moving from lying on your back to sitting on the side of a flat bed without using bedrails?: A Lot Help needed moving to and from a bed to a chair (including a wheelchair)?: A Lot Help needed standing up from a chair using your arms (e.g., wheelchair or bedside chair)?: A Little Help needed to walk in hospital room?: A Lot Help needed climbing 3-5 steps with a railing? : A Lot 6 Click Score: 13    End of Session Equipment Utilized During Treatment: Gait belt Activity Tolerance: Patient tolerated treatment well;Patient limited by fatigue Patient left: in chair;with call bell/phone within reach;with family/visitor present Nurse Communication: Mobility status PT Visit Diagnosis: Unsteadiness on feet (R26.81);Other abnormalities of gait and mobility (R26.89);Muscle weakness (generalized) (M62.81)    Time: 0922-0949 PT Time Calculation (min) (ACUTE ONLY): 27 min   Charges:   PT Evaluation $PT Eval Moderate Complexity: 1 Mod PT Treatments $Therapeutic Activity: 23-37 mins PT General Charges $$ ACUTE PT VISIT: 1 Visit         2:09 PM, 06/04/23 Ocie Bob, MPT Physical Therapist with [facility] [phone] office [phone] mobile phone

## 2023-06-04 NOTE — Consult Note (Signed)
 [facility] Consult  Reason for Consult: Gallstones, right side pain Referring Physician: Dr. Mariea Clonts   Chief Complaint   Back Pain     HPI: Grant Bowers is a 78 y.o. 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 He has been in bed for 8 weeks and resting. He has been losing weight with being in bed and inactive. He reports that he fell about 3 times this past weekend and reports to me going to the toilet "seeing black" and falling. He says that he thinks he hit his side and maybe the back of his head but he is not sure. He had another event a little bit later. His wife reports that he has been confused lately and in addition he reports that he  told her that he "saw black."  He had a US of the carotid back in 2017 for screening he reports.   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   [facility];   followed by [facility] cardiology--- dr Graciela Husbands   Diabetes mellitus without complication (HCC)    Dyspnea due to COVID-19    with exertion   ED (erectile dysfunction)    GERD (gastroesophageal reflux disease)    History of 2019 novel coronavirus disease (COVID-19) 05/07/2020   per pt mild symptoms all resolved with exception DOE   History of cardiac murmur as a child    History of kidney stones    Hyperlipidemia    Hypertension    followed by pcp and cardiology   Hypogonadism male    Mild atherosclerosis of both carotid arteries 09/23/2015   duplex doppler in epic bilateral ICA 1-39%   OSA on CPAP    per pt uses nightly   Post-COVID chronic dyspnea    had covid 05-07-2020;  per pt never had issue prior to covid ;  PFT 10-19-2020 normal per dr c. young   Right ureteral calculus 02/11/2010   Skin cancer    Calf   Trifascicular block    alternating with BBB per dr Graciela Husbands note;  s/p PPM 07-09-2020;   echo  07-29-2020 mild LVH, G1DD, ef 65-70%, moderate LAE, mild MR without stenosis    Past Surgical History:  Procedure Laterality Date   CARDIAC CATHETERIZATION  2009   per pt in [place] , told was normal   CARDIAC CATHETERIZATION  02/17/2010   @[facility]   by dr Clifton James--- no angiographic evidence cad, normal lvsf,  false positive stress test   COLONOSCOPY  12/07/2006   [facility] [place]    CYSTOSCOPY WITH RETROGRADE PYELOGRAM, URETEROSCOPY AND STENT PLACEMENT Right 10/29/2020   Procedure: CYSTOSCOPY WITH RETROGRADE PYELOGRAM, URETEROSCOPY AND STENT PLACEMENT;  Surgeon: Marcine Matar, MD;  Location: [facility];  Service: Urology;  Laterality: Right;   CYSTOSCOPY WITH RETROGRADE PYELOGRAM, URETEROSCOPY AND STENT PLACEMENT Right 11/09/2020   Procedure: CYSTOSCOPY WITH RIGHT  RETROGRADE PYELOGRAM, URETEROSCOPY AND STENT EXHANGE;  Surgeon: Belva Agee, MD;  Location: [facility];  Service: Urology;  Laterality: Right;  30 MINS   CYSTOSCOPY WITH URETEROSCOPY, STONE BASKETRY AND STENT PLACEMENT  2012   CYSTOSCOPY/URETEROSCOPY/HOLMIUM LASER/STENT PLACEMENT Right 12/20/2021   Procedure: CYSTOSCOPY/URETEROSCOPY/HOLMIUM LASER/STENT PLACEMENT;  Surgeon: Belva Agee, MD;  Location: [facility];  Service: Urology;  Laterality: Right;  45 MINS   EXTRACORPOREAL SHOCK WAVE LITHOTRIPSY Right  04/29/2020   Procedure: EXTRACORPOREAL SHOCK WAVE LITHOTRIPSY (ESWL);  Surgeon: Noel Christmas, MD;  Location: [facility];  Service: Urology;  Laterality: Right;   HOLMIUM LASER APPLICATION Right 11/09/2020   Procedure: HOLMIUM LASER APPLICATION;  Surgeon: Belva Agee, MD;  Location: [facility];  Service: Urology;  Laterality: Right;   KNEE ARTHROSCOPY Left 2008   PACEMAKER IMPLANT N/A 07/09/2020   Procedure: PACEMAKER IMPLANT;  Surgeon: Duke Salvia, MD;  Location: [facility];  Service: Cardiovascular;  Laterality: N/A;    Family History  Problem Relation  Age of Onset   Pancreatic cancer Mother    Diabetes Father    Diabetes Brother    Colon polyps Brother    Colon cancer Neg Hx    Esophageal cancer Neg Hx    Rectal cancer Neg Hx    Stomach cancer Neg Hx     Social History   Tobacco Use   Smoking status: Never   Smokeless tobacco: Never  Vaping Use   Vaping status: Never Used  Substance Use Topics   Alcohol use: Not Currently    Comment: very rare    Drug use: Never    Medications: I have reviewed the patient's current medications. Prior to Admission:  Medications Prior to Admission  Medication Sig Dispense Refill Last Dose/Taking   acetaminophen (TYLENOL) 325 MG tablet Take 650 mg by mouth every 6 (six) hours as needed for fever.      Alcohol Swabs (B-D SINGLE USE SWABS REGULAR) PADS Use as directed once per day E11.9 100 each 12    alfuzosin (UROXATRAL) 10 MG 24 hr tablet Take 10 mg by mouth at bedtime.      amLODipine (NORVASC) 10 MG tablet TAKE 1 TABLET EVERY DAY ANNUAL APPT DUE IN JANUARY MUST SEE PROVIDER FOR FUTURE REFILLS 90 tablet 3    Blood Glucose Calibration (TRUE METRIX LEVEL 1) Low SOLN Use as directed once per day E11.9 1 each 1    Blood Glucose Monitoring Suppl (TRUE METRIX METER) DEVI USE AS DIRECTED TO test blood sugar EVERY DAY      cetirizine (ZYRTEC) 10 MG tablet TAKE 1 TABLET EVERY DAY 90 tablet 3    Chlorpheniramine-DM (CORICIDIN HBP COUGH/COLD PO) Take 1 tablet by mouth daily as needed (cough/cold). (Patient not taking: Reported on 05/21/2023)      Cholecalciferol (VITAMIN D3 SUPER STRENGTH) 50 MCG (2000 UT) TABS Take 2,000 Units by mouth daily.      glucose blood (TRUE METRIX BLOOD GLUCOSE TEST) test strip TEST BLOOD SUGAR TWO TIMES DAILY AS INSTRUCTED 200 strip 3    hydrochlorothiazide (MICROZIDE) 12.5 MG capsule TAKE 1 CAPSULE EVERY DAY 90 capsule 3    irbesartan (AVAPRO) 300 MG tablet TAKE 1 TABLET AT BEDTIME. ANNUAL APPT DUE IN [place]. MUST SEE PROVIDER FOR FUTURE REFILLS 90 tablet 3    metFORMIN  (GLUCOPHAGE-XR) 500 MG 24 hr tablet TAKE 1 TABLET EVERY DAY WITH BREAKFAST 90 tablet 3    omeprazole (PRILOSEC) 10 MG capsule TAKE 1 CAPSULE EVERY DAY ANNUAL APPT DUE IN JANUARY MUST SEE PROVIDER FOR FUTURE REFILLS 90 capsule 3    oxyCODONE (ROXICODONE) 5 MG immediate release tablet Take 1 tablet (5 mg total) by mouth every 6 (six) hours as needed for severe pain (pain score 7-10). 30 tablet 0    predniSONE (DELTASONE) 20 MG tablet Take 40 mg by mouth daily. (Patient not taking: Reported on 06/04/2023)   Not Taking   rosuvastatin (  CRESTOR) 20 MG tablet TAKE 1 TABLET EVERY DAY 90 tablet 3    tolterodine (DETROL LA) 2 MG 24 hr capsule Take 2 mg by mouth daily.      TRUEplus Lancets 33G MISC Use as directed once per day E11.9 100 each 12    zinc gluconate 50 MG tablet Take 50 mg by mouth daily.      Scheduled:  alfuzosin  10 mg Oral QHS   amLODipine  10 mg Oral Daily   enoxaparin (LOVENOX) injection  40 mg Subcutaneous Q24H   feeding supplement (GLUCERNA SHAKE)  237 mL Oral TID BM   insulin aspart  0-15 Units Subcutaneous TID WC   irbesartan  300 mg Oral Daily   methocarbamol  500 mg Oral TID   polyethylene glycol  17 g Oral BID   rosuvastatin  20 mg Oral Daily   senna-docusate  2 tablet Oral QHS   Continuous: UEA:[id number] **OR** acetaminophen, HYDROmorphone (DILAUDID) injection, oxyCODONE, prochlorperazine  No Known Allergies   ROS:  A comprehensive review of systems was negative except for: Constitutional: positive for weakness, weight-loss, ? syncope Musculoskeletal: positive for back pain and right hip pain  Blood pressure 120/84, pulse (!) 110, temperature 98.3 F (36.8 C), temperature source Oral, resp. rate 20, height 6' (1.829 m), weight 80.4 kg, SpO2 95%. Physical Exam Vitals reviewed.  HENT:     Head: Normocephalic.     Nose: Nose normal.  Eyes:     Extraocular Movements: Extraocular movements intact.  Neck:     Vascular: Carotid bruit present.     Comments: Right  side carotid bruit Cardiovascular:     Rate and Rhythm: Normal rate.  Pulmonary:     Effort: Pulmonary effort is normal.     Breath sounds: Normal breath sounds.  Abdominal:     General: There is no distension.     Palpations: Abdomen is soft.     Tenderness: There is no abdominal tenderness.     Comments: Right hip pain with palpation   Musculoskeletal:        General: Normal range of motion.  Skin:    General: Skin is warm.  Neurological:     General: No focal deficit present.     Mental Status: He is alert and oriented to person, place, and time.  Psychiatric:        Behavior: Behavior normal.        Thought Content: Thought content normal.        Judgment: Judgment normal.     Results: Results for orders placed or performed during the hospital encounter of 06/03/23 (from the past 48 hours)  CBC with Differential     Status: Abnormal   Collection Time: 06/03/23  9:53 PM  Result Value Ref Range   WBC 9.0 4.0 - 10.5 K/uL   RBC 3.95 (L) 4.22 - 5.81 MIL/uL   Hemoglobin 10.7 (L) 13.0 - 17.0 g/dL   HCT 95.6 (L) 21.3 - 08.6 %   MCV 82.5 80.0 - 100.0 fL   MCH 27.1 26.0 - 34.0 pg   MCHC 32.8 30.0 - 36.0 g/dL   RDW 57.8 46.9 - 62.9 %   Platelets 265 150 - 400 K/uL   nRBC 0.0 0.0 - 0.2 %   Neutrophils Relative % 88 %   Neutro Abs 7.9 (H) 1.7 - 7.7 K/uL   Lymphocytes Relative 6 %   Lymphs Abs 0.5 (L) 0.7 - 4.0 K/uL   Monocytes Relative 6 %  Monocytes Absolute 0.5 0.1 - 1.0 K/uL   Eosinophils Relative 0 %   Eosinophils Absolute 0.0 0.0 - 0.5 K/uL   Basophils Relative 0 %   Basophils Absolute 0.0 0.0 - 0.1 K/uL   Immature Granulocytes 0 %   Abs Immature Granulocytes 0.04 0.00 - 0.07 K/uL    Comment: Performed at [facility], [address], [place], [place] [address]  Comprehensive metabolic panel     Status: Abnormal   Collection Time: 06/03/23  9:53 PM  Result Value Ref Range   Sodium 132 (L) 135 - 145 mmol/L   Potassium 3.8 3.5 - 5.1 mmol/L   Chloride 97 (L) 98 -  111 mmol/L   CO2 22 22 - 32 mmol/L   Glucose, Bld 153 (H) 70 - 99 mg/dL    Comment: Glucose reference range applies only to samples taken after fasting for at least 8 hours.   BUN 27 (H) 8 - 23 mg/dL   Creatinine, Ser 6.04 0.61 - 1.24 mg/dL   Calcium 8.4 (L) 8.9 - 10.3 mg/dL   Total Protein 5.6 (L) 6.5 - 8.1 g/dL   Albumin 2.3 (L) 3.5 - 5.0 g/dL   AST 24 15 - 41 U/L   ALT 37 0 - 44 U/L   Alkaline Phosphatase 88 38 - 126 U/L   Total Bilirubin 0.9 0.0 - 1.2 mg/dL   GFR, Estimated >54 >09 mL/min    Comment: (NOTE) Calculated using the CKD-EPI Creatinine Equation (2021)    Anion gap 13 5 - 15    Comment: Performed at [facility], [address], [place], [place] [address]  Ethanol     Status: None   Collection Time: 06/03/23  9:53 PM  Result Value Ref Range   Alcohol, Ethyl (B) <10 <10 mg/dL    Comment: (NOTE) Lowest detectable limit for serum alcohol is 10 mg/dL.  For medical purposes only. Performed at [facility], [address], [place], [place] [address]   Urinalysis, Routine w reflex microscopic -Urine, Clean Catch     Status: Abnormal   Collection Time: 06/03/23 10:23 PM  Result Value Ref Range   Color, Urine YELLOW YELLOW   APPearance CLEAR CLEAR   Specific Gravity, Urine 1.016 1.005 - 1.030   pH 6.0 5.0 - 8.0   Glucose, UA NEGATIVE NEGATIVE mg/dL   Hgb urine dipstick NEGATIVE NEGATIVE   Bilirubin Urine NEGATIVE NEGATIVE   Ketones, ur 5 (A) NEGATIVE mg/dL   Protein, ur 30 (A) NEGATIVE mg/dL   Nitrite NEGATIVE NEGATIVE   Leukocytes,Ua NEGATIVE NEGATIVE   RBC / HPF 0-5 0 - 5 RBC/hpf   WBC, UA 0-5 0 - 5 WBC/hpf   Bacteria, UA NONE SEEN NONE SEEN   Squamous Epithelial / HPF 0-5 0 - 5 /HPF    Comment: Performed at [facility], [address] [place], [place] [address]  Rapid urine drug screen (hospital performed)     Status: None   Collection Time: 06/03/23 10:23 PM  Result Value Ref Range   Opiates NONE DETECTED NONE DETECTED   Cocaine NONE DETECTED NONE DETECTED    Benzodiazepines NONE DETECTED NONE DETECTED   Amphetamines NONE DETECTED NONE DETECTED   Tetrahydrocannabinol NONE DETECTED NONE DETECTED   Barbiturates NONE DETECTED NONE DETECTED    Comment: (NOTE) DRUG SCREEN FOR MEDICAL PURPOSES ONLY.  IF CONFIRMATION IS NEEDED FOR ANY PURPOSE, NOTIFY LAB WITHIN 5 DAYS.  LOWEST DETECTABLE LIMITS FOR URINE DRUG SCREEN Drug Class  Cutoff (ng/mL) Amphetamine and metabolites    1000 Barbiturate and metabolites    200 Benzodiazepine                 200 Opiates and metabolites        300 Cocaine and metabolites        300 THC                            50 Performed at [facility], [address], [place], [place] [address]   Comprehensive metabolic panel     Status: Abnormal   Collection Time: 06/04/23  5:12 AM  Result Value Ref Range   Sodium 129 (L) 135 - 145 mmol/L   Potassium 3.8 3.5 - 5.1 mmol/L   Chloride 98 98 - 111 mmol/L   CO2 22 22 - 32 mmol/L   Glucose, Bld 156 (H) 70 - 99 mg/dL    Comment: Glucose reference range applies only to samples taken after fasting for at least 8 hours.   BUN 28 (H) 8 - 23 mg/dL   Creatinine, Ser 9.56 0.61 - 1.24 mg/dL   Calcium 8.2 (L) 8.9 - 10.3 mg/dL   Total Protein 5.8 (L) 6.5 - 8.1 g/dL   Albumin 2.3 (L) 3.5 - 5.0 g/dL   AST 23 15 - 41 U/L   ALT 34 0 - 44 U/L   Alkaline Phosphatase 82 38 - 126 U/L   Total Bilirubin 1.0 0.0 - 1.2 mg/dL   GFR, Estimated >21 >30 mL/min    Comment: (NOTE) Calculated using the CKD-EPI Creatinine Equation (2021)    Anion gap 9 5 - 15    Comment: Performed at [facility], [address], [place], [place] [address]  CBC     Status: Abnormal   Collection Time: 06/04/23  5:12 AM  Result Value Ref Range   WBC 10.8 (H) 4.0 - 10.5 K/uL   RBC 3.81 (L) 4.22 - 5.81 MIL/uL   Hemoglobin 10.2 (L) 13.0 - 17.0 g/dL   HCT 46.9 (L) 62.9 - 52.8 %   MCV 81.6 80.0 - 100.0 fL   MCH 26.8 26.0 - 34.0 pg   MCHC 32.8 30.0 - 36.0 g/dL   RDW 41.3 24.4 - 01.0 %    Platelets 293 150 - 400 K/uL   nRBC 0.0 0.0 - 0.2 %    Comment: Performed at [facility], [address], [place], [place] [address]  Magnesium     Status: None   Collection Time: 06/04/23  5:12 AM  Result Value Ref Range   Magnesium 1.9 1.7 - 2.4 mg/dL    Comment: Performed at [facility], [address], [place], [place] [address]  Phosphorus     Status: None   Collection Time: 06/04/23  5:12 AM  Result Value Ref Range   Phosphorus 4.2 2.5 - 4.6 mg/dL    Comment: Performed at [facility], [address] [place], [place] [address]  Glucose, capillary     Status: Abnormal   Collection Time: 06/04/23  7:46 AM  Result Value Ref Range   Glucose-Capillary 147 (H) 70 - 99 mg/dL    Comment: Glucose reference range applies only to samples taken after fasting for at least 8 hours.  Glucose, capillary     Status: Abnormal   Collection Time: 06/04/23 11:37 AM  Result Value Ref Range   Glucose-Capillary 174 (H) 70 - 99 mg/dL    Comment: Glucose reference  range applies only to samples taken after fasting for at least 8 hours.   Personally reviewed- gallstones but no wall thickening or signs of inflammation, nothing acute on CT scan, constipated  US Abdomen Limited Result Date: 06/04/2023 CLINICAL DATA:  Abdominal pain EXAM: ULTRASOUND ABDOMEN LIMITED RIGHT UPPER QUADRANT COMPARISON:  CT 06/03/2023. FINDINGS: Gallbladder: Dilated gallbladder. Dependent stones. Is also some non dependent echogenic areas which could resent small polyps measuring up to 4 mm. No adjacent fluid or wall thickening. No reported sonographic Murphy's sign. Common bile duct: Diameter: 4 mm Liver: No focal lesion identified. Within normal limits in parenchymal echogenicity. Portal vein is patent on color Doppler imaging with normal direction of blood flow towards the liver. Other: Trace right pleural fluid. Small right-sided renal cystic focus identified as seen on prior CT. IMPRESSION: Distended gallbladder with stones and small  polyps. No wall thickening, adjacent fluid or ductal dilatation. No additional sonographic evidence of acute cholecystitis. Trace right pleural fluid. Please correlate with prior CT Electronically Signed   By: Karen Kays M.D.   On: 06/04/2023 12:34   CT Angio Chest/Abd/Pel for Dissection W and/or Wo Contrast Result Date: 06/03/2023 CLINICAL DATA:  Head and neck trauma, low back pain and multiple frequent falls. Evaluating for acute aortic syndrome. EXAM: CT ANGIOGRAPHY CHEST, ABDOMEN AND PELVIS TECHNIQUE: A noncontrast chest CT study was initially performed to look for aortic hematoma. Multidetector CT imaging through the chest, abdomen and pelvis was performed using the standard protocol during bolus administration of intravenous contrast. Multiplanar reconstructed images and MIPs were obtained and reviewed to evaluate the vascular anatomy. RADIATION DOSE REDUCTION: This exam was performed according to the departmental dose-optimization program which includes automated exposure control, adjustment of the mA and/or kV according to patient size and/or use of iterative reconstruction technique. CONTRAST:  80mL OMNIPAQUE IOHEXOL 350 MG/ML SOLN COMPARISON:  CTA chest and PA and lateral chest both 04/26/2023, CT abdomen pelvis without and with contrast 12/16/2018. FINDINGS: CTA CHEST FINDINGS Cardiovascular: There is metallic artifact from a left chest dual lead pacing system and wires in the right heart. The pulmonary arteries are normal in caliber, centrally clear on this nondedicated exam. There is preferential opacification of the aorta and great vessels. There is no aneurysm, dissection or stenosis. There are moderate to heavy aortic calcific plaques, occasional calcifications in the great vessels. There is a normal variant brachiobicarotid trunk and normal variant origin of the left vertebral artery from the aortic arch. The cardiac size is normal. There is interval increased pericardial effusion now 1 cm in  thickness, previously 7 mm. Scattered calcific plaque left main, lad and circumflex coronary arteries. No venous dilatation. Mediastinum/Nodes: Few slightly frontal bilateral hilar nodes up to 1.1 cm in short axis. No mediastinal or axillary adenopathy. Thyroid gland is unremarkable. The thoracic trachea, thoracic esophagus, both main bronchi unremarkable. Lungs/Pleura: There are bilateral minimal layering pleural effusions, both slightly increased since 04/26/2023. Again noted linear scarring or atelectasis in both lung bases. Mild fissural atelectasis in the left upper lobe. There is no consolidation or pneumothorax. No pulmonary nodules are seen. Musculoskeletal: Extensive bridging enthesopathy of the thoracic spine beginning at T4. Degenerative change and mild thoracic dextroscoliosis. No acute or other significant osseous findings.  No chest wall mass. Review of the MIP images confirms the above findings. CTA ABDOMEN AND PELVIS FINDINGS VASCULAR Aorta: Moderate to heavy aortoiliac calcific plaques. No stenosis. 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 Celiac: Patent without evidence of  aneurysm, dissection, vasculitis or significant stenosis. Scattered branch calcific plaques splenic artery. No branch occlusions. Nonstenosing ostial calcific plaques. SMA: 50-60% calcific origin stenosis. The vessel otherwise opacifies well without visible branch occlusion. Minimal calcific plaque past the inflection point of the vessel. Renals: 2 roughly codominant arteries supplying each kidney. Both upper pole arteries demonstrate approximately 50% calcific origin stenosis. The right lower pole artery appears to have at least a 75 % calcific origin stenosis. The left lower pole artery is patent. IMA: Patent without evidence of aneurysm, dissection, vasculitis or significant stenosis. Inflow: Patent without evidence of aneurysm, dissection, vasculitis or significant stenosis.  There are mild-to-moderate calcific plaques. Veins: No obvious venous abnormality within the limitations of this arterial phase study. Review of the MIP images confirms the above findings. NON-VASCULAR Hepatobiliary: Small scattered hepatic cysts and a few too small to characterize hepatic hypodensities, unchanged. No follow-up imaging recommended. Largest cyst is 1.7 cm and 11 Hounsfield units in the dome of segment 8. Gallbladder dilated to nearly 11 cm. There is a subcentimeter stone in the proximal lumen. On some of the axial images there is suspicion of pericholecystic edema and slight gallbladder wall thickening which may be seen with cholecystitis. Consider ultrasound follow-up for further study. No biliary dilatation. Pancreas: No abnormality. Spleen: Increased splenomegaly, the current AP splenic axis 17 cm previously 15.5 cm. No mass. Adrenals/Urinary Tract: There is no adrenal mass. Bilateral Bosniak 1 renal cysts are again seen, largest is a right parapelvic cyst measuring 4.6 cm and 15 Hounsfield units. No follow-up imaging is recommended. There is cortical thinning in both kidneys, scattered cortical scarring of the inferior poles. There previously was a 6 mm right ureteral stone which has passed. There is no hydronephrosis or appreciable urinary stones at present. No mass enhancement. The bladder is unremarkable. Stomach/Bowel: No dilatation or wall thickening including the appendix. Moderate fecal stasis. Sigmoid diverticulosis with no evidence of diverticulitis. Lymphatic: No appreciable adenopathy. Reproductive: 5.5 cm enlarged prostate. Scattered dystrophic prostatic calcifications. There is a mild posterior bladder impression. Right testicle appears to be retracted into the inguinal canal. Other: Small umbilical and inguinal fat hernias. No free hemorrhage, free fluid or free air, or localizing collections. Musculoskeletal: Degenerative change and slight levoscoliosis lumbar spine. Ankylosed SI  joints. Mild hip DJD. No concerning regional bone lesion. Lumbar spine CT is dictated separately. Review of the MIP images confirms the above findings. IMPRESSION: 1. No acute trauma related findings in the chest, abdomen or pelvis. 2. Aortoiliac and coronary artery atherosclerosis. No dissection or aortic stenosis. 3. 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 Recommend surveillance ultrasound in 5 years. Reference: Journal of Vascular Surgery 67.1 (2018): 2-77. J Am Coll Radiol 2013;10:789-794. 4. 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 5. 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 6. Dilated gallbladder with stone and possible pericholecystic edema and slight wall thickening. Consider ultrasound follow-up to assess for cholecystitis. 7. Increased splenomegaly. 8. Constipation and diverticulosis. 9. Prostatomegaly with mild posterior bladder impression. Right testicle appears retracted into the inguinal canal. 10. Umbilical and inguinal fat hernias. 11. Increased pericardial effusion now 1 cm in thickness, previously 7 mm. 12. Slightly increased bilateral pleural effusions. 13. Critical Value/emergent results were called by telephone at the time of interpretation on 06/03/2023 at 11:29 pm to provider JOSEPH ZAMMIT , who verbally acknowledged these results. Aortic Atherosclerosis (ICD10-I70.0). Electronically Signed   By: Almira Bar M.D.   On: 06/03/2023 23:47   CT  L-SPINE NO CHARGE Result Date: 06/03/2023 CLINICAL DATA:  Frequent falls, worsening back pain. EXAM: CT LUMBAR SPINE WITHOUT CONTRAST TECHNIQUE: Multidetector CT imaging of the lumbar spine was performed without intravenous contrast administration. Multiplanar CT image reconstructions were also generated. RADIATION DOSE REDUCTION: This exam was performed according to the departmental dose-optimization program which includes automated exposure control, adjustment of the mA and/or kV  according to patient size and/or use of iterative reconstruction technique. COMPARISON:  CT abdomen and pelvis without and with contrast 12/15/2021 FINDINGS: Segmentation: 5 lumbar type vertebrae. Alignment: Chronic minimal discogenic degenerative grade 1 retrolisthesis L3-4, L4-5 and L5-S1. Slight levoscoliosis. No new, worsening or further alignment abnormality is seen. Vertebrae: Due to technique, the images are grainy with suboptimal spatial resolution. Accounting for this limitation no acute fracture is suspected or focal pathologic process. The vertebra are normal in heights. There are prominent marginal osteophytes, with attempted bridging anteriorly at L2-3 and to the right anteriorly at L4-5 and L5-S1. Paraspinal and other soft tissues: Heavy aortoiliac calcific plaques. 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 Multiple partially visible Bosniak 1 right renal cysts. No follow-up imaging recommended. No paraspinal hematoma, mass or fluid collection. Disc levels: T11-12: The disc is degenerated. Small anterior osteophytes. No herniation or stenosis. T12-L1: The disc is normal in height. There is no bulge, herniation or stenosis. Trace facet spurring. L1-2: The disc is normal in height. There is a minimal nonstenosing dorsal disc bulge without herniation. The foramina are clear. Trace facet spurring. L2-3: The disc is normal in height. There is a mild diffuse annular bulge without herniation, spinal canal or foraminal stenosis. Mild facet joint spurring. L3-4: Mild-to-moderate disc space loss. Anterior and posterior osteophytes are present. Broad-based posterior disc osteophyte complex mildly encroaches on the lateral recesses and descending L4 nerve roots. There is only slight spinal canal stenosis. Mild facet spurring with unilateral mild right foraminal stenosis. L4-5: Mild disc space loss. There is circumferential disc osteophyte complex. Right paracentral shallow disc protrusion is seen with likely mass  effect on the descending right L5 nerve root. There is mild-to-moderate spinal canal stenosis with hypertrophic dorsal ligamentous calcification contributing. Facet spurring with bilateral mild foraminal stenosis. L5-S1: Mild disc space loss. Partial ossification across the posterior disc space. Posterior endplate osteophytes with posterior disc osteophyte complex again noted. Shallow central disc protrusion is seen, with disc osteophyte complex and protrusion causing mild mass effect on the S1 nerve roots. No significant foraminal narrowing and only mild facet spurring. 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 The visualized sacrum is intact. Unremarkable visualized posterior pelvis. IMPRESSION: 1. Suboptimal image quality, with no convincing acute lumbar fracture or focal pathologic process. 2. Scoliosis and degenerative change with multilevel disc osteophyte complexes, and protrusions at the lowest 2 levels. 3. Right paracentral shallow disc protrusion at L4-5 with likely mass effect on the descending right L5 nerve root. 4. Shallow central disc protrusion at L5-S1 with disc osteophyte complex and protrusion causing mild mass effect on the S1 nerve roots. 5. Multilevel facet spurring with mild foraminal stenosis. 6. Aortoiliac atherosclerosis. 7. 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 Recommend follow-up ultrasound every 5 years. Reference: Journal of Vascular Surgery 67.1 (2018): 2-77. J Am Coll Radiol [phone]. Aortic Atherosclerosis (ICD10-I70.0). Electronically Signed   By: Almira Bar M.D.   On: 06/03/2023 23:03   CT Head Wo Contrast Result Date: 06/03/2023 CLINICAL DATA:  Falls EXAM: CT HEAD WITHOUT CONTRAST CT CERVICAL SPINE WITHOUT CONTRAST TECHNIQUE: Multidetector CT imaging of the head and  cervical spine was performed following the standard protocol without intravenous contrast. Multiplanar CT image reconstructions of the cervical spine were also generated. RADIATION DOSE  REDUCTION: This exam was performed according to the departmental dose-optimization program which includes automated exposure control, adjustment of the mA and/or kV according to patient size and/or use of iterative reconstruction technique. COMPARISON:  None Available. FINDINGS: CT HEAD FINDINGS Brain: No mass,hemorrhage or extra-axial collection. Normal appearance of the parenchyma and CSF spaces. Vascular: No hyperdense vessel or unexpected vascular calcification. Skull: The visualized skull base, calvarium and extracranial soft tissues are normal. Sinuses/Orbits: No fluid levels or advanced mucosal thickening of the visualized paranasal sinuses. No mastoid or middle ear effusion. Normal orbits. Other: None. CT CERVICAL SPINE FINDINGS Alignment: No static subluxation. Facets are aligned. Occipital condyles are normally positioned. Skull base and vertebrae: No acute fracture. Soft tissues and spinal canal: No prevertebral fluid or swelling. No visible canal hematoma. Disc levels: No advanced spinal canal or neural foraminal stenosis. Upper chest: No pneumothorax, pulmonary nodule or pleural effusion. Other: Normal visualized paraspinal cervical soft tissues. IMPRESSION: 1. No acute intracranial abnormality. 2. No acute fracture or static subluxation of the cervical spine. Electronically Signed   By: Deatra Robinson M.D.   On: 06/03/2023 22:45   CT Cervical Spine Wo Contrast Result Date: 06/03/2023 CLINICAL DATA:  Falls EXAM: CT HEAD WITHOUT CONTRAST CT CERVICAL SPINE WITHOUT CONTRAST TECHNIQUE: Multidetector CT imaging of the head and cervical spine was performed following the standard protocol without intravenous contrast. Multiplanar CT image reconstructions of the cervical spine were also generated. RADIATION DOSE REDUCTION: This exam was performed according to the departmental dose-optimization program which includes automated exposure control, adjustment of the mA and/or kV according to patient size and/or  use of iterative reconstruction technique. COMPARISON:  None Available. FINDINGS: CT HEAD FINDINGS Brain: No mass,hemorrhage or extra-axial collection. Normal appearance of the parenchyma and CSF spaces. Vascular: No hyperdense vessel or unexpected vascular calcification. Skull: The visualized skull base, calvarium and extracranial soft tissues are normal. Sinuses/Orbits: No fluid levels or advanced mucosal thickening of the visualized paranasal sinuses. No mastoid or middle ear effusion. Normal orbits. Other: None. CT CERVICAL SPINE FINDINGS Alignment: No static subluxation. Facets are aligned. Occipital condyles are normally positioned. Skull base and vertebrae: No acute fracture. Soft tissues and spinal canal: No prevertebral fluid or swelling. No visible canal hematoma. Disc levels: No advanced spinal canal or neural foraminal stenosis. Upper chest: No pneumothorax, pulmonary nodule or pleural effusion. Other: Normal visualized paraspinal cervical soft tissues. IMPRESSION: 1. No acute intracranial abnormality. 2. No acute fracture or static subluxation of the cervical spine. Electronically Signed   By: Deatra Robinson M.D.   On: 06/03/2023 22:45     Assessment & Plan:  Grant Bowers is a 78 y.o. male with a recent falls at home in the setting of deconditioning and weight loss after 8 weeks of being in bed after pericarditis.  He has been describing right hip pain after these falls. He has been pan scanned. He denies any pain with eating food. He has had some nausea and per this wife just vomited up some of his meal but has not had issues eating. He is constipated since coming off colchicine for the pericarditis per his wife. Liver test are all normal.   I do not think this is related to his gallbladder.  I did hear a carotid bruit and have ordered and [place].  Diet as tolerated Bowel regimen  May need additional syncope  workup   Discussed with the patient and wife potential need for PT and rehab given his  deconditioning. Discussed with team.   All questions were answered to the satisfaction of the patient and family.     Lucretia Roers 06/04/2023, 12:44 PM

## 2023-06-05 ENCOUNTER — Telehealth: Payer: Self-pay | Admitting: Internal Medicine

## 2023-06-05 LAB — GLUCOSE, CAPILLARY
Glucose-Capillary: 150 mg/dL — ABNORMAL HIGH (ref 70–99)
Glucose-Capillary: 171 mg/dL — ABNORMAL HIGH (ref 70–99)

## 2023-06-05 MED ORDER — IRBESARTAN 300 MG PO TABS: 300.0000 mg | ORAL_TABLET | Freq: Every day | ORAL | 3 refills | Status: DC

## 2023-06-05 MED ORDER — MIRTAZAPINE 15 MG PO TABS: 15.0000 mg | ORAL_TABLET | Freq: Every day | ORAL | 3 refills | Status: DC

## 2023-06-05 MED ORDER — PREDNISONE 20 MG PO TABS
40.0000 mg | ORAL_TABLET | Freq: Every day | ORAL | 0 refills | Status: AC
Start: 2023-06-05 — End: 2023-06-10

## 2023-06-05 MED ORDER — POLYETHYLENE GLYCOL 3350 17 G PO PACK: 17.0000 g | PACK | Freq: Two times a day (BID) | ORAL | 3 refills | Status: DC

## 2023-06-05 MED ORDER — METHOCARBAMOL 500 MG PO TABS: 500.0000 mg | ORAL_TABLET | Freq: Three times a day (TID) | ORAL | 3 refills | Status: DC

## 2023-06-05 MED ORDER — OXYCODONE HCL 5 MG PO TABS: 5.0000 mg | ORAL_TABLET | Freq: Four times a day (QID) | ORAL | 0 refills | Status: DC | PRN

## 2023-06-05 MED ORDER — PANTOPRAZOLE SODIUM 40 MG PO TBEC: 40.0000 mg | DELAYED_RELEASE_TABLET | Freq: Every day | ORAL | 1 refills | Status: DC

## 2023-06-05 MED ORDER — ASPIRIN 81 MG PO TBEC
81.0000 mg | DELAYED_RELEASE_TABLET | Freq: Every day | ORAL | 2 refills | Status: DC
Start: 2023-06-05 — End: 2023-07-26

## 2023-06-05 MED ORDER — HYDROCHLOROTHIAZIDE 12.5 MG PO CAPS: 12.5000 mg | ORAL_CAPSULE | Freq: Every day | ORAL | 3 refills | Status: DC

## 2023-06-05 MED ORDER — SENNOSIDES-DOCUSATE SODIUM 8.6-50 MG PO TABS: 2.0000 | ORAL_TABLET | Freq: Every day | ORAL | 3 refills | Status: DC

## 2023-06-05 MED ORDER — METFORMIN HCL ER 500 MG PO TB24: ORAL_TABLET | ORAL | 3 refills | Status: DC

## 2023-06-05 MED ORDER — AMLODIPINE BESYLATE 10 MG PO TABS: 10.0000 mg | ORAL_TABLET | Freq: Every day | ORAL | 3 refills | Status: DC

## 2023-06-05 MED ORDER — ACETAMINOPHEN 325 MG PO TABS: 650.0000 mg | ORAL_TABLET | Freq: Four times a day (QID) | ORAL | Status: DC | PRN

## 2023-06-05 NOTE — Progress Notes (Signed)
 [facility]  [place] report reviewed, discussed with Dr. Mariea Clonts.  Patient should follow up with Vascular outpatient to get screening as per protocol.  Gallstones, can follow up with me as needed for symptoms.  Algis Greenhouse, MD

## 2023-06-05 NOTE — Telephone Encounter (Unsigned)
 Copied from CRM [id number]. Topic: Clinical - Home Health Verbal Orders >> Jun 05, 2023  2:44 PM Larwance Sachs wrote: Caller/Agency: Yvonna Alanis- [facility] Callback Number: [phone] Called in for Dr. Jonny Ruiz to give verbal agreement to home health orders sent by Pattricia Boss Pen

## 2023-06-05 NOTE — TOC Transition Note (Signed)
 Transition of Care [facility]) - Discharge Note   Patient Details  Name: Grant Bowers MRN: [id number] Date of Birth: 06-26-1945  Transition of [facility]) CM/SW Contact:  Karn Cassis, LCSW Phone Number: 06/05/2023, 8:32 AM   Clinical Narrative:  Pt d/c today. Maralyn Sago updated and will notify [facility]. Home health orders in.      Final next level of care: Home w Home Health Services Barriers to Discharge: Barriers Resolved   Patient Goals and CMS Choice Patient states their goals for this hospitalization and ongoing recovery are:: return home   Choice offered to / list presented to : Patient [place] ownership interest in [facility].provided to::  (n/a)    Discharge Placement                       Discharge Plan and Services Additional resources added to the After Visit Summary for   In-house Referral: Clinical Social Work   Post Acute Care Choice: Home Health                    HH Arranged: PT [facility] Agency: [facility] Date [facility] Agency Contacted: 06/04/23 Time HH Agency Contacted: [id number] Representative spoke with at [facility] Agency: Maralyn Sago  Social Drivers of Health (SDOH) Interventions SDOH Screenings   Food Insecurity: No Food Insecurity (06/04/2023)  Housing: Low Risk  (06/04/2023)  Transportation Needs: No Transportation Needs (06/04/2023)  Utilities: Not At Risk (06/04/2023)  Depression (PHQ2-9): Low Risk  (04/17/2023)  Social Connections: Moderately Isolated (06/04/2023)  Tobacco Use: Low Risk  (06/03/2023)     Readmission Risk Interventions     No data to display

## 2023-06-05 NOTE — Discharge Summary (Signed)
 Grant Bowers, is a 78 y.o. male  DOB 07-20-1945  MRN [id number].  Admission date:  06/03/2023  Admitting Physician  Frankey Shown, DO  Discharge Date:  06/05/2023   Primary MD  [name], MD  Recommendations for primary care physician for things to follow:  1)  physical therapy as advised--to improve your gait and conditioning and reduce your risk of falls 2) avoid constipation 3) PCP--primary care physician to consider outpatient referral to vascular surgery for carotid artery stenosis  Admission Diagnosis  Abdomen enlarged [R19.8] Lower abdominal pain [R10.30] Abdominal pain [R10.9]   Discharge Diagnosis  Abdomen enlarged [R19.8] Lower abdominal pain [R10.30] Abdominal pain [R10.9]    Principal Problem:   Abdominal pain Active Problems:   Essential hypertension, benign   Mixed hyperlipidemia   Acute midline low back pain without sciatica   Recurrent falls   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)   Type 2 diabetes mellitus with hyperglycemia (HCC)   BPH (benign prostatic hyperplasia)   Gallstones   Pain of right hip      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   [facility];   followed by [facility] cardiology--- dr Graciela Husbands   Diabetes mellitus without complication (HCC)    Dyspnea due to COVID-19    with exertion   ED (erectile dysfunction)    GERD (gastroesophageal reflux disease)    History of 2019 novel coronavirus disease (COVID-19) 05/07/2020   per pt mild symptoms all resolved with exception DOE   History of cardiac murmur as a child    History of kidney stones    Hyperlipidemia    Hypertension    followed by pcp and cardiology   Hypogonadism male    Mild atherosclerosis of both carotid arteries 09/23/2015   duplex doppler in epic bilateral  ICA 1-39%   OSA on CPAP    per pt uses nightly   Post-COVID chronic dyspnea    had covid 05-07-2020;  per pt never had issue prior to covid ;  PFT 10-19-2020 normal per dr c. young   Right ureteral calculus 02/11/2010   Skin cancer    Calf   Trifascicular block    alternating with BBB per dr Graciela Husbands note;  s/p PPM 07-09-2020;   echo 07-29-2020 mild LVH, G1DD, ef 65-70%, moderate LAE, mild MR without stenosis    Past Surgical History:  Procedure Laterality Date   CARDIAC CATHETERIZATION  2009   per pt in [place] , told was normal   CARDIAC CATHETERIZATION  02/17/2010   @[facility]   by dr Clifton James--- no angiographic evidence cad, normal lvsf,  false positive stress test   COLONOSCOPY  12/07/2006   [facility] in [place]    CYSTOSCOPY WITH RETROGRADE PYELOGRAM, URETEROSCOPY AND STENT PLACEMENT Right 10/29/2020   Procedure: CYSTOSCOPY WITH RETROGRADE PYELOGRAM, URETEROSCOPY AND STENT PLACEMENT;  Surgeon: Marcine Matar, MD;  Location: [facility];  Service: Urology;  Laterality: Right;   CYSTOSCOPY WITH RETROGRADE PYELOGRAM, URETEROSCOPY AND STENT PLACEMENT Right 11/09/2020   Procedure: CYSTOSCOPY WITH RIGHT  RETROGRADE PYELOGRAM, URETEROSCOPY AND STENT EXHANGE;  Surgeon: Belva Agee, MD;  Location: [facility];  Service: Urology;  Laterality: Right;  30 MINS   CYSTOSCOPY WITH URETEROSCOPY, STONE BASKETRY AND STENT PLACEMENT  2012   CYSTOSCOPY/URETEROSCOPY/HOLMIUM LASER/STENT PLACEMENT Right 12/20/2021   Procedure: CYSTOSCOPY/URETEROSCOPY/HOLMIUM LASER/STENT PLACEMENT;  Surgeon: Belva Agee, MD;  Location: [facility];  Service: Urology;  Laterality: Right;  45 MINS   EXTRACORPOREAL SHOCK WAVE LITHOTRIPSY Right 04/29/2020   Procedure: EXTRACORPOREAL SHOCK WAVE LITHOTRIPSY (ESWL);  Surgeon: Noel Christmas, MD;  Location: [facility];  Service: Urology;  Laterality: Right;   HOLMIUM LASER APPLICATION Right 11/09/2020   Procedure: HOLMIUM LASER APPLICATION;  Surgeon:  Belva Agee, MD;  Location: [facility];  Service: Urology;  Laterality: Right;   KNEE ARTHROSCOPY Left 2008   PACEMAKER IMPLANT N/A 07/09/2020   Procedure: PACEMAKER IMPLANT;  Surgeon: Duke Salvia, MD;  Location: [facility];  Service: Cardiovascular;  Laterality: N/A;     HPI  from the history and physical done on the day of admission:   HPI: Grant Bowers is a 78 y.o. male with medical history significant of type 2 diabetes mellitus, essential hypertension, mixed hyperlipidemia, low back pain (on oxycodone at home.), trifascicular block s/p PPM,  OSA on CPAP, GERD who presents to the ED emergency department due to worsening low back pain.  Patient.endorsed 3 episodes of fall within last few days.  Pain was right-sided low back pain with radiation into the groin, it was severe and was without any elevating/aggravating factor.  Patient also complained of right upper quadrant pain which was moderate in intensity.  He denies chest pain and shortness of breath.   ED Course:  In the emergency department, respiratory rate was 20/min, pulse 110/min, BP 124/57, other vital signs were within normal range.  Workup in the ED showed normocytic anemia, BMP showed sodium 132, potassium 3.8, chloride 97, bicarb 22, glucose 153, BUN 27, creatinine 1.04 albumin 2.3, urinalysis was normal, urine drug screen was normal, alcohol level was less than 10. CT head without contrast showed no acute intracranial abnormality CT cervical spine without contrast showed no acute fracture or static subluxation of the cervical spine gastric aspirate- CT lumbar spine without contrast showed no convincing acute lumbar fracture or focal pathological process.  Right paracentral shallow disc protrusion at L4-5 with likely mass effect on the descending right L5 nerve root. Shallow central disc protrusion at L5-S1 with disc osteophyte complex and protrusion causing mild mass effect on the S1 nerve roots. CT  angiography of chest, abdomen and pelvis showed dilated gallbladder with stone and possible pericholecystic edema and slight wall thickening. General surgery (Dr. Robyne Peers) was consulted and recommended admitting patient with plan to consult on patient in the morning.  Hospitalist was asked to admit patient for further evaluation and management.   Review of Systems: Review of systems as noted in the HPI. All other systems reviewed and are negative.   Hospital Course:   Brief Narrative:  79 y.o. 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 Henreitta Leber appreciated -Symptomatic and supportive treatment for now -Low-fat diet advised -As needed antiemetics and as needed pain meds -Outpatient follow-up with general surgeon if symptoms recur   2)Hyponatremia/Dehydration--due to dehydration in the setting of poor oral intake -Increase oral intake advised, avoid dehydration   3)Adult failure to thrive/moderate protein caloric malnutrition-- -Serum albumin is 2.3 -Increase oral intake and supplements advised   4)DM2--- A1c 6.2 reflecting excellent diabetic control PTA- -resume PTA metformin   5)HTN--continue Amlodipine and Avapro   6)BPH--- continue alfuzosin   7)HLD--continue Crestor           8)Generalized Weakness and Recurrent Falls/?? Syncope---  -CT lumbar spine without contrast showed no convincing acute lumbar fracture or focal pathological process.  No acute fracture or static  subluxation of the cervical spine  CT head w/o acute findings -Carotid Dopplers findings noted outpatient follow-up with vascular surgeon advised Phy Therapy eval appreciated recommend home PT   9)Low Back Pain--- physical therapy eval appreciated recommends home health PT -Lumbar and cervical imaging studies as above #8 -Methocarbamol and as needed pain meds as ordered   10)Constipation--- anticipate worsening constipation with opiates for pain control  -Patient had BM with MiraLAX during the day and Senokot nightly -Continue laxatives and avoid constipation   Disposition: The patient is from: Home              Anticipated d/c is to: Home with [facility] services      Discharge Condition: stable  Follow UP   Follow-up Information     Inc[facility] Follow up.   Contact information: [address] [place] [address] [phone]         [name], MD. Schedule an appointment as soon as possible for a visit.   Specialties: Internal Medicine, Radiology Why: If symptoms worsen, As needed Contact information: [address] [place] [address] [phone]                  Consults obtained -General Surgery for cholelithiasis  Diet and Activity recommendation:  As advised  Discharge Instructions    Discharge Instructions     Call MD for:  difficulty breathing, headache or visual disturbances   Complete by: As directed    Call MD for:  persistant dizziness or light-headedness   Complete by: As directed    Call MD for:  persistant nausea and vomiting   Complete by: As directed    Call MD for:  temperature >100.4   Complete by: As directed    Diet - low sodium heart healthy   Complete by: As directed    Diet Carb Modified   Complete by: As directed    Discharge instructions   Complete by: As directed    1)  physical therapy as advised--to improve your gait and conditioning and reduce your risk of falls 2) avoid constipation 3)  PCP--primary care physician to consider outpatient referral to vascular surgery for carotid artery stenosis   Increase activity slowly   Complete by: As directed          Discharge Medications     Allergies as of 06/05/2023   No Known Allergies      Medication List     STOP taking these medications    cetirizine 10 MG tablet Commonly known as: ZYRTEC  CORICIDIN HBP COUGH/COLD PO   omeprazole 10 MG capsule Commonly known as: PRILOSEC       TAKE these medications    acetaminophen 325 MG tablet Commonly known as: TYLENOL Take 2 tablets (650 mg total) by mouth every 6 (six) hours as needed for mild pain (pain score 1-3) (or Fever >/= 101). What changed: reasons to take this   alfuzosin 10 MG 24 hr tablet Commonly known as: UROXATRAL Take 10 mg by mouth at bedtime.   amLODipine 10 MG tablet Commonly known as: NORVASC Take 1 tablet (10 mg total) by mouth daily. What changed: See the new instructions.   aspirin EC 81 MG tablet Take 1 tablet (81 mg total) by mouth daily with breakfast.   B-D SINGLE USE SWABS REGULAR Pads Use as directed once per day E11.9   hydrochlorothiazide 12.5 MG capsule Commonly known as: MICROZIDE Take 1 capsule (12.5 mg total) by mouth daily.   irbesartan 300 MG tablet Commonly known as: AVAPRO Take 1 tablet (300 mg total) by mouth daily. What changed: See the new instructions.   metFORMIN 500 MG 24 hr tablet Commonly known as: GLUCOPHAGE-XR TAKE 1 TABLET EVERY DAY WITH BREAKFAST   methocarbamol 500 MG tablet Commonly known as: ROBAXIN Take 1 tablet (500 mg total) by mouth 3 (three) times daily.   mirtazapine 15 MG tablet Commonly known as: Remeron Take 1 tablet (15 mg total) by mouth at bedtime.   oxyCODONE 5 MG immediate release tablet Commonly known as: Roxicodone Take 1 tablet (5 mg total) by mouth every 6 (six) hours as needed for severe pain (pain score 7-10).   pantoprazole 40 MG tablet Commonly known as:  Protonix Take 1 tablet (40 mg total) by mouth daily.   polyethylene glycol 17 g packet Commonly known as: MIRALAX / GLYCOLAX Take 17 g by mouth 2 (two) times daily.   predniSONE 20 MG tablet Commonly known as: DELTASONE Take 2 tablets (40 mg total) by mouth daily with breakfast for 5 days. What changed: when to take this   rosuvastatin 20 MG tablet Commonly known as: CRESTOR TAKE 1 TABLET EVERY DAY   senna-docusate 8.6-50 MG tablet Commonly known as: Senokot-S Take 2 tablets by mouth at bedtime.   tolterodine 2 MG 24 hr capsule Commonly known as: DETROL LA Take 2 mg by mouth daily.   True Metrix Blood Glucose Test test strip Generic drug: glucose blood TEST BLOOD SUGAR TWO TIMES DAILY AS INSTRUCTED   True Metrix Level 1 Low Soln Use as directed once per day E11.9   True Metrix Meter Devi USE AS DIRECTED TO test blood sugar EVERY DAY   TRUEplus Lancets 33G Misc Use as directed once per day E11.9   Vitamin D3 Super Strength 50 MCG (2000 UT) tablet Generic drug: Cholecalciferol Take 2,000 Units by mouth daily.   zinc gluconate 50 MG tablet Take 50 mg by mouth daily.        Major procedures and Radiology Reports - PLEASE review detailed and final reports for all details, in brief -   US Carotid Bilateral Result Date: 06/05/2023 CLINICAL DATA:  Possible syncope.  Right-sided bruit. EXAM: BILATERAL CAROTID DUPLEX ULTRASOUND TECHNIQUE: Wallace Cullens scale imaging, color Doppler and duplex ultrasound were performed of bilateral carotid and vertebral arteries in the neck. COMPARISON:  None Available. FINDINGS: Criteria: Quantification of carotid stenosis is based on velocity parameters that correlate the residual internal carotid diameter with NASCET-based stenosis levels, using the diameter of the distal internal carotid lumen as  the denominator for stenosis measurement. The following velocity measurements were obtained: RIGHT ICA: 164/16 cm/sec CCA: 74/11 cm/sec SYSTOLIC ICA/CCA  RATIO:  2.9 ECA: 249 cm/sec LEFT ICA: 103/29 cm/sec CCA: 85/14 cm/sec SYSTOLIC ICA/CCA RATIO:  1.2 ECA: 161 cm/sec RIGHT CAROTID ARTERY: Echogenic plaque at the right carotid bulb. Elevated peak systolic velocity in the right external carotid artery. Echogenic plaque in the proximal internal carotid artery. Peak systolic velocity in the proximal internal carotid artery is 164 cm/sec. RIGHT VERTEBRAL ARTERY: Antegrade flow and normal waveform in the right vertebral artery. LEFT CAROTID ARTERY: Intimal thickening in the left common carotid artery. Small amount of echogenic plaque at the left carotid bulb. External carotid artery is patent with normal waveform. Normal velocities in the internal carotid artery. LEFT VERTEBRAL ARTERY: Antegrade flow and normal waveform in the left vertebral artery. IMPRESSION: 1. Atherosclerotic plaque at the right carotid bulb and right internal carotid artery with elevated peak systolic velocity in the proximal right internal carotid artery. 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 2. Mild atherosclerotic plaque in the left carotid arteries. Findings are compatible with less than 50% stenosis in the left internal carotid artery. 3. Antegrade flow in the vertebral arteries. 4. Probable stenosis in the right external carotid artery. Electronically Signed   By: Richarda Overlie M.D.   On: 06/05/2023 10:59   DG HIP UNILAT W OR W/O PELVIS 2-3 VIEWS RIGHT Result Date: 06/04/2023 CLINICAL DATA:  Right hip pain, fall 2 weeks ago EXAM: DG HIP (WITH OR WITHOUT PELVIS) 2-3V RIGHT COMPARISON:  None Available. FINDINGS: There is no evidence of hip fracture or dislocation. There is no evidence of arthropathy or other focal bone abnormality. IMPRESSION: Negative. Electronically Signed   By: Charlett Nose M.D.   On: 06/04/2023 20:01   US Abdomen Limited Result Date: 06/04/2023 CLINICAL DATA:  Abdominal pain EXAM: ULTRASOUND ABDOMEN LIMITED RIGHT UPPER QUADRANT  COMPARISON:  CT 06/03/2023. FINDINGS: Gallbladder: Dilated gallbladder. Dependent stones. Is also some non dependent echogenic areas which could resent small polyps measuring up to 4 mm. No adjacent fluid or wall thickening. No reported sonographic Murphy's sign. Common bile duct: Diameter: 4 mm Liver: No focal lesion identified. Within normal limits in parenchymal echogenicity. Portal vein is patent on color Doppler imaging with normal direction of blood flow towards the liver. Other: Trace right pleural fluid. Small right-sided renal cystic focus identified as seen on prior CT. IMPRESSION: Distended gallbladder with stones and small polyps. No wall thickening, adjacent fluid or ductal dilatation. No additional sonographic evidence of acute cholecystitis. Trace right pleural fluid. Please correlate with prior CT Electronically Signed   By: Karen Kays M.D.   On: 06/04/2023 12:34   CT Angio Chest/Abd/Pel for Dissection W and/or Wo Contrast Result Date: 06/03/2023 CLINICAL DATA:  Head and neck trauma, low back pain and multiple frequent falls. Evaluating for acute aortic syndrome. EXAM: CT ANGIOGRAPHY CHEST, ABDOMEN AND PELVIS TECHNIQUE: A noncontrast chest CT study was initially performed to look for aortic hematoma. Multidetector CT imaging through the chest, abdomen and pelvis was performed using the standard protocol during bolus administration of intravenous contrast. Multiplanar reconstructed images and MIPs were obtained and reviewed to evaluate the vascular anatomy. RADIATION DOSE REDUCTION: This exam was performed according to the departmental dose-optimization program which includes automated exposure control, adjustment of the mA and/or kV according to patient size and/or use of iterative reconstruction technique. CONTRAST:  80mL OMNIPAQUE IOHEXOL 350 MG/ML SOLN COMPARISON:  CTA chest  and PA and lateral chest both 04/26/2023, CT abdomen pelvis without and with contrast 12/16/2018. FINDINGS: CTA CHEST  FINDINGS Cardiovascular: There is metallic artifact from a left chest dual lead pacing system and wires in the right heart. The pulmonary arteries are normal in caliber, centrally clear on this nondedicated exam. There is preferential opacification of the aorta and great vessels. There is no aneurysm, dissection or stenosis. There are moderate to heavy aortic calcific plaques, occasional calcifications in the great vessels. There is a normal variant brachiobicarotid trunk and normal variant origin of the left vertebral artery from the aortic arch. The cardiac size is normal. There is interval increased pericardial effusion now 1 cm in thickness, previously 7 mm. Scattered calcific plaque left main, lad and circumflex coronary arteries. No venous dilatation. Mediastinum/Nodes: Few slightly frontal bilateral hilar nodes up to 1.1 cm in short axis. No mediastinal or axillary adenopathy. Thyroid gland is unremarkable. The thoracic trachea, thoracic esophagus, both main bronchi unremarkable. Lungs/Pleura: There are bilateral minimal layering pleural effusions, both slightly increased since 04/26/2023. Again noted linear scarring or atelectasis in both lung bases. Mild fissural atelectasis in the left upper lobe. There is no consolidation or pneumothorax. No pulmonary nodules are seen. Musculoskeletal: Extensive bridging enthesopathy of the thoracic spine beginning at T4. Degenerative change and mild thoracic dextroscoliosis. No acute or other significant osseous findings.  No chest wall mass. Review of the MIP images confirms the above findings. CTA ABDOMEN AND PELVIS FINDINGS VASCULAR Aorta: Moderate to heavy aortoiliac calcific plaques. No stenosis. 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 Celiac: Patent without evidence of aneurysm, dissection, vasculitis or significant stenosis. Scattered branch calcific plaques splenic artery. No branch occlusions. Nonstenosing  ostial calcific plaques. SMA: 50-60% calcific origin stenosis. The vessel otherwise opacifies well without visible branch occlusion. Minimal calcific plaque past the inflection point of the vessel. Renals: 2 roughly codominant arteries supplying each kidney. Both upper pole arteries demonstrate approximately 50% calcific origin stenosis. The right lower pole artery appears to have at least a 75 % calcific origin stenosis. The left lower pole artery is patent. IMA: Patent without evidence of aneurysm, dissection, vasculitis or significant stenosis. Inflow: Patent without evidence of aneurysm, dissection, vasculitis or significant stenosis. There are mild-to-moderate calcific plaques. Veins: No obvious venous abnormality within the limitations of this arterial phase study. Review of the MIP images confirms the above findings. NON-VASCULAR Hepatobiliary: Small scattered hepatic cysts and a few too small to characterize hepatic hypodensities, unchanged. No follow-up imaging recommended. Largest cyst is 1.7 cm and 11 Hounsfield units in the dome of segment 8. Gallbladder dilated to nearly 11 cm. There is a subcentimeter stone in the proximal lumen. On some of the axial images there is suspicion of pericholecystic edema and slight gallbladder wall thickening which may be seen with cholecystitis. Consider ultrasound follow-up for further study. No biliary dilatation. Pancreas: No abnormality. Spleen: Increased splenomegaly, the current AP splenic axis 17 cm previously 15.5 cm. No mass. Adrenals/Urinary Tract: There is no adrenal mass. Bilateral Bosniak 1 renal cysts are again seen, largest is a right parapelvic cyst measuring 4.6 cm and 15 Hounsfield units. No follow-up imaging is recommended. There is cortical thinning in both kidneys, scattered cortical scarring of the inferior poles. There previously was a 6 mm right ureteral stone which has passed. There is no hydronephrosis or appreciable urinary stones at present. No  mass enhancement. The bladder is unremarkable. Stomach/Bowel: No dilatation or wall thickening including the appendix. Moderate  fecal stasis. Sigmoid diverticulosis with no evidence of diverticulitis. Lymphatic: No appreciable adenopathy. Reproductive: 5.5 cm enlarged prostate. Scattered dystrophic prostatic calcifications. There is a mild posterior bladder impression. Right testicle appears to be retracted into the inguinal canal. Other: Small umbilical and inguinal fat hernias. No free hemorrhage, free fluid or free air, or localizing collections. Musculoskeletal: Degenerative change and slight levoscoliosis lumbar spine. Ankylosed SI joints. Mild hip DJD. No concerning regional bone lesion. Lumbar spine CT is dictated separately. Review of the MIP images confirms the above findings. IMPRESSION: 1. No acute trauma related findings in the chest, abdomen or pelvis. 2. Aortoiliac and coronary artery atherosclerosis. No dissection or aortic stenosis. 3. 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 Recommend surveillance ultrasound in 5 years. Reference: Journal of Vascular Surgery 67.1 (2018): 2-77. J Am Coll Radiol 2013;10:789-794. 4. 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 5. 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 6. Dilated gallbladder with stone and possible pericholecystic edema and slight wall thickening. Consider ultrasound follow-up to assess for cholecystitis. 7. Increased splenomegaly. 8. Constipation and diverticulosis. 9. Prostatomegaly with mild posterior bladder impression. Right testicle appears retracted into the inguinal canal. 10. Umbilical and inguinal fat hernias. 11. Increased pericardial effusion now 1 cm in thickness, previously 7 mm. 12. Slightly increased bilateral pleural effusions. 13. Critical Value/emergent results were called by telephone at the time of interpretation on 06/03/2023 at 11:29 pm to provider JOSEPH ZAMMIT ,  who verbally acknowledged these results. Aortic Atherosclerosis (ICD10-I70.0). Electronically Signed   By: Almira Bar M.D.   On: 06/03/2023 23:47   CT L-SPINE NO CHARGE Result Date: 06/03/2023 CLINICAL DATA:  Frequent falls, worsening back pain. EXAM: CT LUMBAR SPINE WITHOUT CONTRAST TECHNIQUE: Multidetector CT imaging of the lumbar spine was performed without intravenous contrast administration. Multiplanar CT image reconstructions were also generated. RADIATION DOSE REDUCTION: This exam was performed according to the departmental dose-optimization program which includes automated exposure control, adjustment of the mA and/or kV according to patient size and/or use of iterative reconstruction technique. COMPARISON:  CT abdomen and pelvis without and with contrast 12/15/2021 FINDINGS: Segmentation: 5 lumbar type vertebrae. Alignment: Chronic minimal discogenic degenerative grade 1 retrolisthesis L3-4, L4-5 and L5-S1. Slight levoscoliosis. No new, worsening or further alignment abnormality is seen. Vertebrae: Due to technique, the images are grainy with suboptimal spatial resolution. Accounting for this limitation no acute fracture is suspected or focal pathologic process. The vertebra are normal in heights. There are prominent marginal osteophytes, with attempted bridging anteriorly at L2-3 and to the right anteriorly at L4-5 and L5-S1. Paraspinal and other soft tissues: Heavy aortoiliac calcific plaques. 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 Multiple partially visible Bosniak 1 right renal cysts. No follow-up imaging recommended. No paraspinal hematoma, mass or fluid collection. Disc levels: T11-12: The disc is degenerated. Small anterior osteophytes. No herniation or stenosis. T12-L1: The disc is normal in height. There is no bulge, herniation or stenosis. Trace facet spurring. L1-2: The disc is normal in height. There is a minimal nonstenosing dorsal disc bulge without herniation. The foramina are  clear. Trace facet spurring. L2-3: The disc is normal in height. There is a mild diffuse annular bulge without herniation, spinal canal or foraminal stenosis. Mild facet joint spurring. L3-4: Mild-to-moderate disc space loss. Anterior and posterior osteophytes are present. Broad-based posterior disc osteophyte complex mildly encroaches on the lateral recesses and descending L4 nerve roots. There is only slight spinal canal stenosis. Mild facet spurring with unilateral  mild right foraminal stenosis. L4-5: Mild disc space loss. There is circumferential disc osteophyte complex. Right paracentral shallow disc protrusion is seen with likely mass effect on the descending right L5 nerve root. There is mild-to-moderate spinal canal stenosis with hypertrophic dorsal ligamentous calcification contributing. Facet spurring with bilateral mild foraminal stenosis. L5-S1: Mild disc space loss. Partial ossification across the posterior disc space. Posterior endplate osteophytes with posterior disc osteophyte complex again noted. Shallow central disc protrusion is seen, with disc osteophyte complex and protrusion causing mild mass effect on the S1 nerve roots. No significant foraminal narrowing and only mild facet spurring. 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 The visualized sacrum is intact. Unremarkable visualized posterior pelvis. IMPRESSION: 1. Suboptimal image quality, with no convincing acute lumbar fracture or focal pathologic process. 2. Scoliosis and degenerative change with multilevel disc osteophyte complexes, and protrusions at the lowest 2 levels. 3. Right paracentral shallow disc protrusion at L4-5 with likely mass effect on the descending right L5 nerve root. 4. Shallow central disc protrusion at L5-S1 with disc osteophyte complex and protrusion causing mild mass effect on the S1 nerve roots. 5. Multilevel facet spurring with mild foraminal stenosis. 6. Aortoiliac atherosclerosis. 7. Unchanged fusiform 2.7  cm infrarenal AAA. Recommend follow-up ultrasound every 5 years. Reference: Journal of Vascular Surgery 67.1 (2018): 2-77. J Am Coll Radiol (845) [id number]. Aortic Atherosclerosis (ICD10-I70.0). Electronically Signed   By: Almira Bar M.D.   On: 06/03/2023 23:03   CT Head Wo Contrast Result Date: 06/03/2023 CLINICAL DATA:  [place] EXAM: CT HEAD WITHOUT CONTRAST CT CERVICAL SPINE WITHOUT CONTRAST TECHNIQUE: Multidetector CT imaging of the head and cervical spine was performed following the standard protocol without intravenous contrast. Multiplanar CT image reconstructions of the cervical spine were also generated. RADIATION DOSE REDUCTION: This exam was performed according to the departmental dose-optimization program which includes automated exposure control, adjustment of the mA and/or kV according to patient size and/or use of iterative reconstruction technique. COMPARISON:  None Available. FINDINGS: CT HEAD FINDINGS Brain: No mass,hemorrhage or extra-axial collection. Normal appearance of the parenchyma and CSF spaces. Vascular: No hyperdense vessel or unexpected vascular calcification. Skull: The visualized skull base, calvarium and extracranial soft tissues are normal. Sinuses/Orbits: No fluid levels or advanced mucosal thickening of the visualized paranasal sinuses. No mastoid or middle ear effusion. Normal orbits. Other: None. CT CERVICAL SPINE FINDINGS Alignment: No static subluxation. Facets are aligned. Occipital condyles are normally positioned. Skull base and vertebrae: No acute fracture. Soft tissues and spinal canal: No prevertebral fluid or swelling. No visible canal hematoma. Disc levels: No advanced spinal canal or neural foraminal stenosis. Upper chest: No pneumothorax, pulmonary nodule or pleural effusion. Other: Normal visualized paraspinal cervical soft tissues. IMPRESSION: 1. No acute intracranial abnormality. 2. No acute fracture or static subluxation of the cervical spine. Electronically  Signed   By: Deatra Robinson M.D.   On: 06/03/2023 22:45   CT Cervical Spine Wo Contrast Result Date: 06/03/2023 CLINICAL DATA:  [place] EXAM: CT HEAD WITHOUT CONTRAST CT CERVICAL SPINE WITHOUT CONTRAST TECHNIQUE: Multidetector CT imaging of the head and cervical spine was performed following the standard protocol without intravenous contrast. Multiplanar CT image reconstructions of the cervical spine were also generated. RADIATION DOSE REDUCTION: This exam was performed according to the departmental dose-optimization program which includes automated exposure control, adjustment of the mA and/or kV according to patient size and/or use of iterative reconstruction technique. COMPARISON:  None Available. FINDINGS: CT HEAD FINDINGS Brain: No mass,hemorrhage or extra-axial collection. Normal appearance  of the parenchyma and CSF spaces. Vascular: No hyperdense vessel or unexpected vascular calcification. Skull: The visualized skull base, calvarium and extracranial soft tissues are normal. Sinuses/Orbits: No fluid levels or advanced mucosal thickening of the visualized paranasal sinuses. No mastoid or middle ear effusion. Normal orbits. Other: None. CT CERVICAL SPINE FINDINGS Alignment: No static subluxation. Facets are aligned. Occipital condyles are normally positioned. Skull base and vertebrae: No acute fracture. Soft tissues and spinal canal: No prevertebral fluid or swelling. No visible canal hematoma. Disc levels: No advanced spinal canal or neural foraminal stenosis. Upper chest: No pneumothorax, pulmonary nodule or pleural effusion. Other: Normal visualized paraspinal cervical soft tissues. IMPRESSION: 1. No acute intracranial abnormality. 2. No acute fracture or static subluxation of the cervical spine. Electronically Signed   By: Deatra Robinson M.D.   On: 06/03/2023 22:45   ECHOCARDIOGRAM LIMITED Result Date: 05/15/2023    ECHOCARDIOGRAM LIMITED REPORT   Patient Name:   Grant Bowers Date of Exam: 05/14/2023  Medical Rec #:  [id number]     Height:       72.0 in Accession #:    [id number]    Weight:       192.6 lb Date of Birth:  [birth year]    BSA:          2.097 m Patient Age:    2 years      BP:           126/80 mmHg Patient Gender: M             HR:           102 bpm. Exam Location:  [facility] Procedure: Limited Echo, Limited Color Doppler and Cardiac Doppler Indications:    R06.9 DOE  History:        Patient has prior history of Echocardiogram examinations, most                 recent 04/25/2023. Pacemaker, Arrythmias:LBBB, RBBB, Bradycardia                 and Tachycardia; Risk Factors:Hypertension, Sleep Apnea and                 Non-Smoker. 04/26/2023 CT angio of chest revealed small                 pericardial effusion with trace bilateral pleural effusions.  Sonographer:    Dominica Severin RCS, RVS Referring Phys: [phone] ELIZABETH PECK IMPRESSIONS  1. Left ventricular ejection fraction, by estimation, is 55 to 60%. The left ventricle has normal function. The left ventricle has no regional wall motion abnormalities. There is mild left ventricular hypertrophy. Indeterminate diastolic filling due to E-A fusion.  2. Right ventricular systolic function is normal. The right ventricular size is normal. Tricuspid regurgitation signal is inadequate for assessing PA pressure.  3. A small pericardial effusion is present. The pericardial effusion is circumferential.  4. The inferior vena cava is normal in size with greater than 50% respiratory variability, suggesting right atrial pressure of 3 mmHg. Comparison(s): No significant change from prior study. EF 55-60%. Small circumferential pericardial effusion. FINDINGS  Left Ventricle: Left ventricular ejection fraction, by estimation, is 55 to 60%. The left ventricle has normal function. The left ventricle has no regional wall motion abnormalities. The left ventricular internal cavity size was normal in size. There is  mild left ventricular hypertrophy. Indeterminate diastolic  filling due to E-A fusion. Right Ventricle: The right ventricular size is normal. No increase in right ventricular  wall thickness. Right ventricular systolic function is normal. Tricuspid regurgitation signal is inadequate for assessing PA pressure. Left Atrium: Left atrial size was not assessed. Right Atrium: Right atrial size was not assessed. Pericardium: A small pericardial effusion is present. The pericardial effusion is circumferential. Mitral Valve: The mitral valve is normal in structure. Trivial mitral valve regurgitation. MV peak gradient, 6.7 mmHg. The mean mitral valve gradient is 3.0 mmHg. Tricuspid Valve: The tricuspid valve is normal in structure. Tricuspid valve regurgitation is not demonstrated. Aortic Valve: The aortic valve is tricuspid. Pulmonic Valve: The pulmonic valve was not well visualized. Aorta: Aortic root could not be assessed. Venous: The inferior vena cava is normal in size with greater than 50% respiratory variability, suggesting right atrial pressure of 3 mmHg. IAS/Shunts: No atrial level shunt detected by color flow Doppler. Additional Comments: A device lead is visualized.  LEFT VENTRICLE PLAX 2D LVIDd:         5.10 cm      Diastology LVIDs:         3.50 cm      LV e' medial:    5.98 cm/s LV PW:         1.30 cm      LV E/e' medial:  12.7 LV IVS:        1.30 cm      LV e' lateral:   4.97 cm/s                             LV E/e' lateral: 15.2  LV Volumes (MOD) LV vol d, MOD A2C: 71.0 ml LV vol d, MOD A4C: 122.0 ml LV vol s, MOD A2C: 31.2 ml LV vol s, MOD A4C: 53.9 ml LV SV MOD A2C:     39.8 ml LV SV MOD A4C:     122.0 ml LV SV MOD BP:      55.4 ml RIGHT VENTRICLE RV Basal diam:  3.00 cm RV Mid diam:    3.10 cm RIGHT ATRIUM           Index RA Area:     10.10 cm RA Volume:   19.40 ml  9.25 ml/m  MITRAL VALVE MV Area (PHT): 3.63 cm MV Peak grad:  6.7 mmHg MV Mean grad:  3.0 mmHg MV Vmax:       1.29 m/s MV Vmean:      74.4 cm/s MV Decel Time: 209 msec MV E velocity: 75.70 cm/s MV A  velocity: 106.00 cm/s MV E/A ratio:  0.71 Vishnu Priya Mallipeddi Electronically signed by Winfield Rast Mallipeddi Signature Date/Time: 05/15/2023/4:47:31 PM    Final     Micro Results  Today   Subjective    Grant Bowers today has no complaints        No fever  Or chills  - Tolerating oral intake well -No nausea no vomiting   Patient has been seen and examined prior to discharge   Objective   Blood pressure 132/65, pulse (!) 109, temperature 97.8 F (36.6 C), temperature source Oral, resp. rate 20, height 6' (1.829 m), weight 80.4 kg, SpO2 96%.   Intake/Output Summary (Last 24 hours) at 06/05/2023 1320 Last data filed at 06/05/2023 0800 Gross per 24 hour  Intake 981.41 ml  Output 400 ml  Net 581.41 ml   Exam Gen:- Awake Alert, in no apparent distress  HEENT:- [place].AT, No sclera icterus Neck-Supple Neck,No JVD, +ve Rt sided Carotid Bruit Lungs-  CTAB ,  fair symmetrical air movement CV- S1, S2 normal, regular  Abd-  +ve B.Sounds, Abd Soft, No significant tenderness,    Extremity/Skin:- No  edema, pedal pulses present , mild tenderness over the right hip on palpation, no obvious deformity Psych-affect is appropriate, oriented x3 Neuro-Generalized weakness, no new focal deficits, no tremors   Data Review   CBC w Diff:  Lab Results  Component Value Date   WBC 10.8 (H) 06/04/2023   HGB 10.2 (L) 06/04/2023   HGB 14.6 06/28/2020   HCT 31.1 (L) 06/04/2023   HCT 42.3 06/28/2020   PLT 293 06/04/2023   PLT 227 06/28/2020   LYMPHOPCT 6 06/03/2023   MONOPCT 6 06/03/2023   EOSPCT 0 06/03/2023   BASOPCT 0 06/03/2023    CMP:  Lab Results  Component Value Date   NA 129 (L) 06/04/2023   NA 138 05/03/2023   K 3.8 06/04/2023   CL 98 06/04/2023   CO2 22 06/04/2023   BUN 28 (H) 06/04/2023   BUN 20 05/03/2023   CREATININE 1.09 06/04/2023   CREATININE 1.23 (H) 11/06/2019   PROT 5.8 (L) 06/04/2023   PROT 6.0 05/03/2023   ALBUMIN 2.3 (L) 06/04/2023   ALBUMIN 3.5 (L)  05/03/2023   BILITOT 1.0 06/04/2023   BILITOT 0.5 05/03/2023   ALKPHOS 82 06/04/2023   AST 23 06/04/2023   ALT 34 06/04/2023  .  Total Discharge time is about 33 minutes  Shon Hale M.D on 06/05/2023 at 1:20 PM  Go to [email or web address] -  for contact info  [facility] - Office  [phone]

## 2023-06-05 NOTE — Discharge Instructions (Addendum)
 1)  physical therapy as advised--to improve your gait and conditioning and reduce your risk of falls 2) avoid constipation 3) PCP--primary care physician to consider outpatient referral to vascular surgery for carotid artery stenosis

## 2023-06-06 ENCOUNTER — Telehealth: Payer: Self-pay | Admitting: *Deleted

## 2023-06-06 NOTE — Telephone Encounter (Signed)
 Ok for [employer]

## 2023-06-06 NOTE — Transitions of Care (Post Inpatient/ED Visit) (Signed)
 06/06/2023  Name: Tanush Drees MRN: [id number] DOB: [birth year]  Today's TOC FU Call Status: Today's TOC FU Call Status:: Successful TOC FU Call Completed (successful call back, as per spouse prior request) TOC FU Call Complete Date: 06/06/23 Patient's Name and Date of Birth confirmed.  Transition Care Management Follow-up Telephone Call Date of Discharge: 06/05/23 Discharge [facility] Type of Discharge: Inpatient Admission Primary Inpatient Discharge Diagnosis:: Abdominal pain/ gallstones without need for surgery How have you been since you were released from the hospital?: Same (per spouse: "It has been touch and go since he got home last night; he is still in a lot of pain.  I need you to call me back later, I need to go tend to him right now") Any questions or concerns?: No  Second outreach placed per spouse request:  Spouse reports that patient has been "sick since November;" she states that since he got home from hospital last night, "he can't get up from out of bed, I am having to do everything for him; I think they just needed a bed and they sent him back home to get rid of him, so they could have their bed. I don't know if I will be able to get him to the doctor or not, but you can go ahead and schedule the appointment with Dr. Jonny Ruiz for next week- I will just cancel the appointment, I won't know if he can do it until the time gets here"  Spouse provides confusing/ conflicting report of patient's current clinical status vs. Discharge status per hospital discharge notes- she is confrontational vs. argumentative vs. overwhelmed- extensive and significant time spent in attempting to assess current clinical condition: provided explicit education around when/ where to seek additional care (ED/ [facility]) if she believes patients condition is declining post-hospital discharge on 06/05/23: to which spouse reports, "he is not worse, and he does not need to go back to the  hospital"  Encouraged spouse to contact home health agency as soon as possible to initiate prompt home health services- she confirms that she has contact information for home health agency and denies need for assistance in contacting home health agency  Provided my direct phone number to spouse should questions/ concerns/ needs arise post-TOC call today; discussed and stressed need for patient to attend post- hospital PCP appointment as arranged today during [facility] call, especially if patient is not doing well post-hospital discharge  Items Reviewed: Did you receive and understand the discharge instructions provided?: Yes Medications obtained,verified, and reconciled?: Yes (Medications Reviewed) (Full medication reconciliation/ review completed; no concerns or discrepancies identified; confirmed patient obtained/ is taking all newly Rx'd medications as instructed; self-manages medications and denies questions/ concerns around medications today) Any new allergies since your discharge?: No Dietary orders reviewed?: Yes Type of Diet Ordered:: Low salt; "heart healthy as much possible" Do you have support at home?: Yes People in Home: spouse Name of Support/Comfort Primary Source: Reports independent in self-care activities at baseline; resides with supportive spouse who assists as/ if needed/ indicated post-hospital discharge  Medications Reviewed Today: Medications Reviewed Today     Reviewed by Michaela Corner, RN (Registered Nurse) on 06/06/23 at 1514  Med List Status: <None>   Medication Order Taking? Sig Documenting Provider Last Dose Status Informant  acetaminophen (TYLENOL) 325 MG tablet [id number] Yes Take 2 tablets (650 mg total) by mouth every 6 (six) hours as needed for mild pain (pain score 1-3) (or Fever >/= 101). Shon Hale, MD Taking Active  Alcohol Swabs (B-D SINGLE USE SWABS REGULAR) PADS [id number] Yes Use as directed once per day E11.9 [name], MD Taking Active Self,  Spouse/Significant Other, Pharmacy Records  alfuzosin (UROXATRAL) 10 MG 24 hr tablet [id number] Yes Take 10 mg by mouth at bedtime. [provider] Taking Active Self, Spouse/Significant Other, Pharmacy Records  amLODipine (NORVASC) 10 MG tablet [id number] Yes Take 1 tablet (10 mg total) by mouth daily. Shon Hale, MD Taking Active   aspirin EC 81 MG tablet [id number] Yes Take 1 tablet (81 mg total) by mouth daily with breakfast. Shon Hale, MD Taking Active   Blood Glucose Calibration (TRUE METRIX LEVEL 1) Low SOLN [id number] Yes Use as directed once per day E11.9 [name], MD Taking Active Self, Spouse/Significant Other, Pharmacy Records  Blood Glucose Monitoring Suppl (TRUE Izola Price) DEVI [id number] Yes USE AS DIRECTED TO test blood sugar EVERY DAY [provider] Taking Active Self, Spouse/Significant Other, Pharmacy Records  Cholecalciferol (VITAMIN D3 SUPER STRENGTH) 50 MCG (2000 UT) TABS [id number] Yes Take 2,000 Units by mouth daily. [provider] Taking Active Self, Spouse/Significant Other, Pharmacy Records  glucose blood (TRUE METRIX BLOOD GLUCOSE TEST) test strip [id number] Yes TEST BLOOD SUGAR TWO TIMES DAILY AS INSTRUCTED [name], MD Taking Active Self, Spouse/Significant Other, Pharmacy Records  hydrochlorothiazide (MICROZIDE) 12.5 MG capsule [id number] Yes Take 1 capsule (12.5 mg total) by mouth daily. Shon Hale, MD Taking Active   irbesartan (AVAPRO) 300 MG tablet [id number] Yes Take 1 tablet (300 mg total) by mouth daily. Shon Hale, MD Taking Active   metFORMIN (GLUCOPHAGE-XR) 500 MG 24 hr tablet [id number] Yes TAKE 1 TABLET EVERY DAY WITH BREAKFAST Emokpae, Courage, MD Taking Active   methocarbamol (ROBAXIN) 500 MG tablet [id number] Yes Take 1 tablet (500 mg total) by mouth 3 (three) times daily. Shon Hale, MD Taking Active   mirtazapine (REMERON) 15 MG tablet [id number] Yes Take 1 tablet (15 mg total) by mouth at  bedtime. Shon Hale, MD Taking Active   oxyCODONE (ROXICODONE) 5 MG immediate release tablet [id number] Yes Take 1 tablet (5 mg total) by mouth every 6 (six) hours as needed for severe pain (pain score 7-10). Shon Hale, MD Taking Active   pantoprazole (PROTONIX) 40 MG tablet [id number] Yes Take 1 tablet (40 mg total) by mouth daily. Shon Hale, MD Taking Active   polyethylene glycol (MIRALAX / GLYCOLAX) 17 g packet [id number] Yes Take 17 g by mouth 2 (two) times daily. Shon Hale, MD Taking Active   predniSONE (DELTASONE) 20 MG tablet [id number] Yes Take 2 tablets (40 mg total) by mouth daily with breakfast for 5 days. Shon Hale, MD Taking Active   rosuvastatin (CRESTOR) 20 MG tablet [id number] Yes TAKE 1 TABLET EVERY DAY [name], MD Taking Active Self, Spouse/Significant Other, Pharmacy Records  senna-docusate (SENOKOT-S) 8.6-50 MG tablet [id number] Yes Take 2 tablets by mouth at bedtime. Shon Hale, MD Taking Active   tolterodine (DETROL LA) 2 MG 24 hr capsule [id number] No Take 2 mg by mouth daily.  Patient not taking: Reported on 06/06/2023   [provider] Not Taking Active Self, Spouse/Significant Other, Pharmacy Records           Med Note Otelia Limes Jun 06, 2023  3:13 PM) 06/06/23: [facility] reports during [facility] [address] call, was prescribed by urology provider: states patient does not have/ is not taking this medication  TRUEplus Lancets 33G MISC [id number] Yes Use as directed once per day E11.9 Oliver Barre  W, MD Taking Active Self, Spouse/Significant Other, Pharmacy Records  zinc gluconate 50 MG tablet [id number] Yes Take 50 mg by mouth daily. [provider] Taking Active Self, Spouse/Significant Other, Pharmacy Records           Home Care and Equipment/Supplies: Were Home Health Services Ordered?: Yes Name of Home Health Agency:: [facility]  [phone] Has Agency set up a time to come to your home?: No EMR  reviewed for Home Health Orders: Orders present/patient has not received call (refer to CM for follow-up) (Spouse understands that home health may take up to 48 hours to contact her: confirmed and provided/ reinforced contact information for home health agency: provided my phone number should issues around initiation of services) Any new equipment or medical supplies ordered?: No  Functional Questionnaire: Do you need assistance with bathing/showering or dressing?: Yes (wife supervising post-hospital discharge; independent at baseline) Do you need assistance with meal preparation?: Yes (wife supervising post-hospital discharge; independent at baseline) Do you need assistance with eating?: No Do you have difficulty maintaining continence: Yes (wife supervising post-hospital discharge; independent at baseline) Do you need assistance with getting out of bed/getting out of a chair/moving?: Yes (wife supervising post-hospital discharge; independent at baseline) Do you have difficulty managing or taking your medications?: Yes (wife supervising post-hospital discharge; independent at baseline)  Follow up appointments reviewed: PCP Follow-up appointment confirmed?: Yes (care coordination outreach in real-time with scheduling care guide to successfully schedule hospital follow up PCP appointment 06/15/23) Date of PCP follow-up appointment?: 06/15/23 Follow-up Provider: PCP Dr. Jonny Ruiz Specialist [facility] Follow-up appointment confirmed?: NA Do you need transportation to your follow-up appointment?: No Do you understand care options if your condition(s) worsen?: Yes-patient verbalized understanding  SDOH Interventions Today    Flowsheet Row Most Recent Value  SDOH Interventions   Transportation Interventions Intervention Not Indicated  [normally drives self,  wife assists as indicated post-hospital discharge]      Interventions Today    Flowsheet Row Most Recent Value  Chronic Disease   Chronic  disease during today's visit Other  [abdominal pain/ gallstones with out need for surgery]  General Interventions   General Interventions Discussed/Reviewed General Interventions Discussed, Durable Medical Equipment (DME), Doctor Visits  Doctor Visits Discussed/Reviewed Doctor Visits Discussed, PCP, Specialist  Durable Medical Equipment (DME) Bed side commode, Walker  PCP/Specialist Visits Compliance with follow-up visit  Exercise Interventions   Exercise Discussed/Reviewed Exercise Discussed  [Provided education around benefit of conservative post-hospital discharge activity,  need to pace activity without over-doing,  need to avoid complete immobility post-hospital discharge]  Education Interventions   Education Provided Provided Education  Provided Verbal Education On When to see the doctor, Medication  Nutrition Interventions   Nutrition Discussed/Reviewed Nutrition Discussed  Pharmacy Interventions   Pharmacy Dicussed/Reviewed Pharmacy Topics Discussed  [Full medication review with updating medication list in EHR per patient report -- wife requests extensive review of all medications including purpose/ scheduling, dosing,  she wants to make notes around each medication: significant time spent on review]  Safety Interventions   Safety Discussed/Reviewed Safety Discussed, Fall Risk      TOC Interventions Today    Flowsheet Row Most Recent Value  TOC Interventions   TOC Interventions Discussed/Reviewed TOC Interventions Discussed, Arranged PCP follow up less than 12 days/Care Guide scheduled  [Spouse declines need for ongoing/ further care management outreach- she is very argumentative throughout call,  declines enrollment in 30-day TOC program,  provided my direct contact information should questions/ concerns/  needs arise post-TOC call]      Total time spent from review to signing of note/ including any care coordination interventions:  103 minutes extensive medication review with  spouse with need for constant repetition/ explanation; challenging spousal interaction- argumentative vs. overwhelmed: unable to discern/ determine from call today; additionally- spouse put me on hold on several occasions during our call to "tend to" patient  Pls call/ message for questions,  Caryl Pina, RN, BSN, CCRN Alumnus RN Care Manager  Transitions of Care  [facility] [phone]: direct office

## 2023-06-06 NOTE — Telephone Encounter (Signed)
 Called and gave verbals.

## 2023-06-08 DIAGNOSIS — K8020 Calculus of gallbladder without cholecystitis without obstruction: Secondary | ICD-10-CM | POA: Diagnosis not present

## 2023-06-08 DIAGNOSIS — E1165 Type 2 diabetes mellitus with hyperglycemia: Secondary | ICD-10-CM | POA: Diagnosis not present

## 2023-06-08 DIAGNOSIS — N189 Chronic kidney disease, unspecified: Secondary | ICD-10-CM | POA: Diagnosis not present

## 2023-06-08 DIAGNOSIS — E1122 Type 2 diabetes mellitus with diabetic chronic kidney disease: Secondary | ICD-10-CM | POA: Diagnosis not present

## 2023-06-08 DIAGNOSIS — I129 Hypertensive chronic kidney disease with stage 1 through stage 4 chronic kidney disease, or unspecified chronic kidney disease: Secondary | ICD-10-CM | POA: Diagnosis not present

## 2023-06-08 DIAGNOSIS — E440 Moderate protein-calorie malnutrition: Secondary | ICD-10-CM | POA: Diagnosis not present

## 2023-06-12 DIAGNOSIS — E440 Moderate protein-calorie malnutrition: Secondary | ICD-10-CM | POA: Diagnosis not present

## 2023-06-12 DIAGNOSIS — I129 Hypertensive chronic kidney disease with stage 1 through stage 4 chronic kidney disease, or unspecified chronic kidney disease: Secondary | ICD-10-CM | POA: Diagnosis not present

## 2023-06-12 DIAGNOSIS — M4620 Osteomyelitis of vertebra, site unspecified: Secondary | ICD-10-CM | POA: Diagnosis not present

## 2023-06-12 DIAGNOSIS — E1122 Type 2 diabetes mellitus with diabetic chronic kidney disease: Secondary | ICD-10-CM | POA: Diagnosis not present

## 2023-06-12 DIAGNOSIS — K8020 Calculus of gallbladder without cholecystitis without obstruction: Secondary | ICD-10-CM | POA: Diagnosis not present

## 2023-06-12 DIAGNOSIS — E1165 Type 2 diabetes mellitus with hyperglycemia: Secondary | ICD-10-CM | POA: Diagnosis not present

## 2023-06-13 DIAGNOSIS — M4620 Osteomyelitis of vertebra, site unspecified: Secondary | ICD-10-CM | POA: Diagnosis not present

## 2023-06-13 DIAGNOSIS — E1122 Type 2 diabetes mellitus with diabetic chronic kidney disease: Secondary | ICD-10-CM | POA: Diagnosis not present

## 2023-06-13 DIAGNOSIS — E440 Moderate protein-calorie malnutrition: Secondary | ICD-10-CM | POA: Diagnosis not present

## 2023-06-13 DIAGNOSIS — E1165 Type 2 diabetes mellitus with hyperglycemia: Secondary | ICD-10-CM | POA: Diagnosis not present

## 2023-06-13 DIAGNOSIS — K8020 Calculus of gallbladder without cholecystitis without obstruction: Secondary | ICD-10-CM | POA: Diagnosis not present

## 2023-06-13 DIAGNOSIS — I129 Hypertensive chronic kidney disease with stage 1 through stage 4 chronic kidney disease, or unspecified chronic kidney disease: Secondary | ICD-10-CM | POA: Diagnosis not present

## 2023-06-14 ENCOUNTER — Inpatient Hospital Stay (HOSPITAL_COMMUNITY)
Admission: EM | Admit: 2023-06-14 | Discharge: 2023-06-30 | DRG: 242 | Disposition: A | Discharge status: Home Health | Source: Ambulatory Visit | Attending: Internal Medicine | Admitting: Internal Medicine

## 2023-06-14 ENCOUNTER — Other Ambulatory Visit: Payer: Self-pay

## 2023-06-14 ENCOUNTER — Emergency Department (HOSPITAL_COMMUNITY)

## 2023-06-14 ENCOUNTER — Encounter (HOSPITAL_COMMUNITY): Payer: Self-pay

## 2023-06-14 DIAGNOSIS — M545 Low back pain, unspecified: Secondary | ICD-10-CM | POA: Diagnosis not present

## 2023-06-14 DIAGNOSIS — Z833 Family history of diabetes mellitus: Secondary | ICD-10-CM

## 2023-06-14 DIAGNOSIS — E46 Unspecified protein-calorie malnutrition: Secondary | ICD-10-CM | POA: Diagnosis present

## 2023-06-14 DIAGNOSIS — I330 Acute and subacute infective endocarditis: Secondary | ICD-10-CM | POA: Diagnosis present

## 2023-06-14 DIAGNOSIS — M4620 Osteomyelitis of vertebra, site unspecified: Secondary | ICD-10-CM | POA: Diagnosis present

## 2023-06-14 DIAGNOSIS — K8020 Calculus of gallbladder without cholecystitis without obstruction: Secondary | ICD-10-CM | POA: Diagnosis not present

## 2023-06-14 DIAGNOSIS — Z79899 Other long term (current) drug therapy: Secondary | ICD-10-CM | POA: Diagnosis not present

## 2023-06-14 DIAGNOSIS — E782 Mixed hyperlipidemia: Secondary | ICD-10-CM | POA: Diagnosis present

## 2023-06-14 DIAGNOSIS — T827XXA Infection and inflammatory reaction due to other cardiac and vascular devices, implants and grafts, initial encounter: Secondary | ICD-10-CM | POA: Diagnosis present

## 2023-06-14 DIAGNOSIS — M4626 Osteomyelitis of vertebra, lumbar region: Principal | ICD-10-CM | POA: Diagnosis present

## 2023-06-14 DIAGNOSIS — N400 Enlarged prostate without lower urinary tract symptoms: Secondary | ICD-10-CM | POA: Diagnosis present

## 2023-06-14 DIAGNOSIS — M4640 Discitis, unspecified, site unspecified: Secondary | ICD-10-CM | POA: Diagnosis present

## 2023-06-14 DIAGNOSIS — R7881 Bacteremia: Secondary | ICD-10-CM | POA: Diagnosis present

## 2023-06-14 DIAGNOSIS — B955 Unspecified streptococcus as the cause of diseases classified elsewhere: Secondary | ICD-10-CM

## 2023-06-14 DIAGNOSIS — E872 Acidosis, unspecified: Secondary | ICD-10-CM | POA: Diagnosis present

## 2023-06-14 DIAGNOSIS — I38 Endocarditis, valve unspecified: Secondary | ICD-10-CM | POA: Diagnosis not present

## 2023-06-14 DIAGNOSIS — E440 Moderate protein-calorie malnutrition: Secondary | ICD-10-CM | POA: Diagnosis present

## 2023-06-14 DIAGNOSIS — I10 Essential (primary) hypertension: Secondary | ICD-10-CM | POA: Diagnosis present

## 2023-06-14 DIAGNOSIS — G8929 Other chronic pain: Secondary | ICD-10-CM | POA: Diagnosis present

## 2023-06-14 DIAGNOSIS — Z8616 Personal history of COVID-19: Secondary | ICD-10-CM

## 2023-06-14 DIAGNOSIS — D649 Anemia, unspecified: Secondary | ICD-10-CM | POA: Diagnosis present

## 2023-06-14 DIAGNOSIS — N529 Male erectile dysfunction, unspecified: Secondary | ICD-10-CM | POA: Diagnosis present

## 2023-06-14 DIAGNOSIS — M5412 Radiculopathy, cervical region: Secondary | ICD-10-CM | POA: Diagnosis not present

## 2023-06-14 DIAGNOSIS — R1030 Lower abdominal pain, unspecified: Secondary | ICD-10-CM | POA: Diagnosis present

## 2023-06-14 DIAGNOSIS — E876 Hypokalemia: Secondary | ICD-10-CM | POA: Diagnosis present

## 2023-06-14 DIAGNOSIS — R509 Fever, unspecified: Secondary | ICD-10-CM | POA: Diagnosis not present

## 2023-06-14 DIAGNOSIS — Z7982 Long term (current) use of aspirin: Secondary | ICD-10-CM

## 2023-06-14 DIAGNOSIS — B9689 Other specified bacterial agents as the cause of diseases classified elsewhere: Secondary | ICD-10-CM | POA: Diagnosis not present

## 2023-06-14 DIAGNOSIS — Z950 Presence of cardiac pacemaker: Secondary | ICD-10-CM | POA: Diagnosis not present

## 2023-06-14 DIAGNOSIS — E1122 Type 2 diabetes mellitus with diabetic chronic kidney disease: Secondary | ICD-10-CM | POA: Diagnosis not present

## 2023-06-14 DIAGNOSIS — B954 Other streptococcus as the cause of diseases classified elsewhere: Secondary | ICD-10-CM | POA: Diagnosis present

## 2023-06-14 DIAGNOSIS — K828 Other specified diseases of gallbladder: Secondary | ICD-10-CM | POA: Diagnosis not present

## 2023-06-14 DIAGNOSIS — E8809 Other disorders of plasma-protein metabolism, not elsewhere classified: Secondary | ICD-10-CM | POA: Diagnosis present

## 2023-06-14 DIAGNOSIS — N189 Chronic kidney disease, unspecified: Secondary | ICD-10-CM | POA: Diagnosis not present

## 2023-06-14 DIAGNOSIS — Z85828 Personal history of other malignant neoplasm of skin: Secondary | ICD-10-CM | POA: Diagnosis not present

## 2023-06-14 DIAGNOSIS — M25551 Pain in right hip: Secondary | ICD-10-CM | POA: Diagnosis not present

## 2023-06-14 DIAGNOSIS — R0602 Shortness of breath: Secondary | ICD-10-CM | POA: Diagnosis not present

## 2023-06-14 DIAGNOSIS — Z7984 Long term (current) use of oral hypoglycemic drugs: Secondary | ICD-10-CM

## 2023-06-14 DIAGNOSIS — R001 Bradycardia, unspecified: Secondary | ICD-10-CM | POA: Diagnosis not present

## 2023-06-14 DIAGNOSIS — K219 Gastro-esophageal reflux disease without esophagitis: Secondary | ICD-10-CM | POA: Diagnosis present

## 2023-06-14 DIAGNOSIS — E1165 Type 2 diabetes mellitus with hyperglycemia: Secondary | ICD-10-CM | POA: Diagnosis present

## 2023-06-14 DIAGNOSIS — K0261 Dental caries on smooth surface limited to enamel: Secondary | ICD-10-CM | POA: Diagnosis not present

## 2023-06-14 DIAGNOSIS — R9431 Abnormal electrocardiogram [ECG] [EKG]: Secondary | ICD-10-CM | POA: Diagnosis present

## 2023-06-14 DIAGNOSIS — Z800 Family history of malignant neoplasm of digestive organs: Secondary | ICD-10-CM

## 2023-06-14 DIAGNOSIS — G4733 Obstructive sleep apnea (adult) (pediatric): Secondary | ICD-10-CM | POA: Diagnosis present

## 2023-06-14 DIAGNOSIS — I340 Nonrheumatic mitral (valve) insufficiency: Secondary | ICD-10-CM

## 2023-06-14 DIAGNOSIS — K047 Periapical abscess without sinus: Secondary | ICD-10-CM | POA: Diagnosis present

## 2023-06-14 DIAGNOSIS — M5416 Radiculopathy, lumbar region: Secondary | ICD-10-CM | POA: Diagnosis not present

## 2023-06-14 DIAGNOSIS — Z1152 Encounter for screening for COVID-19: Secondary | ICD-10-CM | POA: Diagnosis not present

## 2023-06-14 DIAGNOSIS — I129 Hypertensive chronic kidney disease with stage 1 through stage 4 chronic kidney disease, or unspecified chronic kidney disease: Secondary | ICD-10-CM | POA: Diagnosis not present

## 2023-06-14 DIAGNOSIS — R000 Tachycardia, unspecified: Secondary | ICD-10-CM | POA: Diagnosis present

## 2023-06-14 DIAGNOSIS — I3139 Other pericardial effusion (noninflammatory): Secondary | ICD-10-CM | POA: Diagnosis present

## 2023-06-14 DIAGNOSIS — I453 Trifascicular block: Secondary | ICD-10-CM | POA: Diagnosis present

## 2023-06-14 DIAGNOSIS — E871 Hypo-osmolality and hyponatremia: Secondary | ICD-10-CM | POA: Insufficient documentation

## 2023-06-14 DIAGNOSIS — Z95810 Presence of automatic (implantable) cardiac defibrillator: Secondary | ICD-10-CM | POA: Diagnosis not present

## 2023-06-14 DIAGNOSIS — E1169 Type 2 diabetes mellitus with other specified complication: Secondary | ICD-10-CM | POA: Diagnosis present

## 2023-06-14 DIAGNOSIS — Z83719 Family history of colon polyps, unspecified: Secondary | ICD-10-CM

## 2023-06-14 DIAGNOSIS — R161 Splenomegaly, not elsewhere classified: Secondary | ICD-10-CM | POA: Diagnosis not present

## 2023-06-14 DIAGNOSIS — R262 Difficulty in walking, not elsewhere classified: Secondary | ICD-10-CM | POA: Diagnosis present

## 2023-06-14 LAB — CBC WITH DIFFERENTIAL/PLATELET
Abs Immature Granulocytes: 0.13 10*3/uL — ABNORMAL HIGH (ref 0.00–0.07)
Basophils Absolute: 0 10*3/uL (ref 0.0–0.1)
Basophils Relative: 0 %
Eosinophils Absolute: 0 10*3/uL (ref 0.0–0.5)
Eosinophils Relative: 0 %
HCT: 33.9 % — ABNORMAL LOW (ref 39.0–52.0)
Hemoglobin: 11.1 g/dL — ABNORMAL LOW (ref 13.0–17.0)
Immature Granulocytes: 1 %
Lymphocytes Relative: 2 %
Lymphs Abs: 0.3 10*3/uL — ABNORMAL LOW (ref 0.7–4.0)
MCH: 26.7 pg (ref 26.0–34.0)
MCHC: 32.7 g/dL (ref 30.0–36.0)
MCV: 81.5 fL (ref 80.0–100.0)
Monocytes Absolute: 0.7 10*3/uL (ref 0.1–1.0)
Monocytes Relative: 5 %
Neutro Abs: 14 10*3/uL — ABNORMAL HIGH (ref 1.7–7.7)
Neutrophils Relative %: 92 %
Platelets: 334 10*3/uL (ref 150–400)
RBC: 4.16 MIL/uL — ABNORMAL LOW (ref 4.22–5.81)
RDW: 14.7 % (ref 11.5–15.5)
WBC: 15.2 10*3/uL — ABNORMAL HIGH (ref 4.0–10.5)
nRBC: 0 % (ref 0.0–0.2)

## 2023-06-14 LAB — URINALYSIS, W/ REFLEX TO CULTURE (INFECTION SUSPECTED)
Bilirubin Urine: NEGATIVE
Glucose, UA: NEGATIVE mg/dL
Hgb urine dipstick: NEGATIVE
Ketones, ur: NEGATIVE mg/dL
Leukocytes,Ua: NEGATIVE
Nitrite: NEGATIVE
Protein, ur: 100 mg/dL — AB
Specific Gravity, Urine: 1.044 — ABNORMAL HIGH (ref 1.005–1.030)
pH: 6 (ref 5.0–8.0)

## 2023-06-14 LAB — COMPREHENSIVE METABOLIC PANEL
ALT: 21 U/L (ref 0–44)
AST: 20 U/L (ref 15–41)
Albumin: 2.5 g/dL — ABNORMAL LOW (ref 3.5–5.0)
Alkaline Phosphatase: 84 U/L (ref 38–126)
Anion gap: 13 (ref 5–15)
BUN: 22 mg/dL (ref 8–23)
CO2: 17 mmol/L — ABNORMAL LOW (ref 22–32)
Calcium: 8.4 mg/dL — ABNORMAL LOW (ref 8.9–10.3)
Chloride: 97 mmol/L — ABNORMAL LOW (ref 98–111)
Creatinine, Ser: 1.44 mg/dL — ABNORMAL HIGH (ref 0.61–1.24)
GFR, Estimated: 50 mL/min — ABNORMAL LOW (ref >60)
Glucose, Bld: 287 mg/dL — ABNORMAL HIGH (ref 70–99)
Potassium: 4 mmol/L (ref 3.5–5.1)
Sodium: 127 mmol/L — ABNORMAL LOW (ref 135–145)
Total Bilirubin: 1.2 mg/dL (ref 0.0–1.2)
Total Protein: 6.1 g/dL — ABNORMAL LOW (ref 6.5–8.1)

## 2023-06-14 LAB — RESP PANEL BY RT-PCR (RSV, FLU A&B, COVID)  RVPGX2
Influenza A by PCR: NEGATIVE
Influenza B by PCR: NEGATIVE
Resp Syncytial Virus by PCR: NEGATIVE
SARS Coronavirus 2 by RT PCR: NEGATIVE

## 2023-06-14 LAB — LACTIC ACID, PLASMA
Lactic Acid, Venous: 3.1 mmol/L (ref 0.5–1.9)
Lactic Acid, Venous: 1.6 mmol/L (ref 0.5–1.9)

## 2023-06-14 LAB — LIPASE, BLOOD: Lipase: 20 U/L (ref 11–51)

## 2023-06-14 MED ORDER — IOHEXOL 300 MG/ML  SOLN
80.0000 mL | Freq: Once | INTRAMUSCULAR | Status: AC | PRN
  Administered 2023-06-14: 80 mL via INTRAVENOUS

## 2023-06-14 MED ORDER — SODIUM CHLORIDE 0.9 % IV BOLUS
500.0000 mL | Freq: Once | INTRAVENOUS | Status: AC
  Administered 2023-06-14: 500 mL via INTRAVENOUS

## 2023-06-14 MED ORDER — VANCOMYCIN HCL 1750 MG/350ML IV SOLN
1750.0000 mg | Freq: Once | INTRAVENOUS | Status: AC
  Administered 2023-06-14: 1750 mg via INTRAVENOUS
  Filled 2023-06-14: qty 350

## 2023-06-14 MED ORDER — PIPERACILLIN-TAZOBACTAM 3.375 G IVPB 30 MIN
3.3750 g | Freq: Once | INTRAVENOUS | Status: AC
  Administered 2023-06-14: 3.375 g via INTRAVENOUS
  Filled 2023-06-14: qty 50

## 2023-06-14 MED ORDER — HYDROMORPHONE HCL 1 MG/ML IJ SOLN
0.5000 mg | Freq: Once | INTRAMUSCULAR | Status: AC
  Administered 2023-06-14: 0.5 mg via INTRAVENOUS
  Filled 2023-06-14: qty 0.5

## 2023-06-14 NOTE — ED Notes (Signed)
 Neurosurgery paged 2x through [facility]

## 2023-06-14 NOTE — ED Provider Notes (Addendum)
 [place] EMERGENCY DEPARTMENT AT [facility] Provider Note   CSN: [id number] Arrival date & time: 06/14/23  1619     History  No chief complaint on file.   Grant Bowers is a 78 y.o. male.  HPI Patient presents with fever abdominal pain back pain.  Pain has been worsening.  Reportedly fevers up to 105 at home.  Recent admission for lower abdominal pain.  Also right hip pain at that time.  No clear cause of the pain found at that time although did have gallstones.  Recent treatment for presumed pericarditis.  Difficult examination due to pain.   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   [facility];   followed by EP cardiology--- dr Graciela Husbands   Diabetes mellitus without complication (HCC)    Dyspnea due to COVID-19    with exertion   ED (erectile dysfunction)    GERD (gastroesophageal reflux disease)    History of 2019 novel coronavirus disease (COVID-19) 05/07/2020   per pt mild symptoms all resolved with exception DOE   History of cardiac murmur as a child    History of kidney stones    Hyperlipidemia    Hypertension    followed by pcp and cardiology   Hypogonadism male    Mild atherosclerosis of both carotid arteries 09/23/2015   duplex doppler in epic bilateral ICA 1-39%   OSA on CPAP    per pt uses nightly   Post-COVID chronic dyspnea    had covid 05-07-2020;  per pt never had issue prior to covid ;  PFT 10-19-2020 normal per dr c. young   Right ureteral calculus 02/11/2010   Skin cancer    Calf   Trifascicular block    alternating with BBB per dr Graciela Husbands note;  s/p PPM 07-09-2020;   echo 07-29-2020 mild LVH, G1DD, ef 65-70%, moderate LAE, mild MR without stenosis    Home Medications Prior to Admission medications   Medication Sig Start Date End Date Taking? Authorizing Provider  acetaminophen (TYLENOL) 325 MG tablet Take 2 tablets (650  mg total) by mouth every 6 (six) hours as needed for mild pain (pain score 1-3) (or Fever >/= 101). Patient taking differently: Take 1,000 mg by mouth every 6 (six) hours as needed for mild pain (pain score 1-3) (or Fever >/= 101). 06/05/23  Yes Emokpae, Courage, MD  alfuzosin (UROXATRAL) 10 MG 24 hr tablet Take 10 mg by mouth at bedtime. 12/08/21  Yes [provider]  amLODipine (NORVASC) 10 MG tablet Take 1 tablet (10 mg total) by mouth daily. 06/05/23  Yes Shon Hale, MD  aspirin EC 81 MG tablet Take 1 tablet (81 mg total) by mouth daily with breakfast. 06/05/23 06/04/24 Yes Emokpae, Courage, MD  Cholecalciferol (VITAMIN D3 SUPER STRENGTH) 50 MCG (2000 UT) TABS Take 2,000 Units by mouth daily.   Yes [provider]  hydrochlorothiazide (MICROZIDE) 12.5 MG capsule Take 1 capsule (12.5 mg total) by mouth daily. 06/05/23  Yes Emokpae, Courage, MD  irbesartan (AVAPRO) 300 MG tablet Take 1 tablet (300 mg total) by mouth daily. 06/05/23  Yes Emokpae, Courage, MD  metFORMIN (GLUCOPHAGE-XR) 500 MG 24 hr tablet TAKE 1 TABLET EVERY DAY WITH BREAKFAST 06/05/23  Yes Emokpae, Courage, MD  methocarbamol (ROBAXIN) 500 MG tablet Take 1 tablet (500 mg total) by mouth 3 (three) times daily. 06/05/23  Yes  Shon Hale, MD  mirtazapine (REMERON) 15 MG tablet Take 1 tablet (15 mg total) by mouth at bedtime. 06/05/23  Yes Emokpae, Courage, MD  oxyCODONE (ROXICODONE) 5 MG immediate release tablet Take 1 tablet (5 mg total) by mouth every 6 (six) hours as needed for severe pain (pain score 7-10). 06/05/23  Yes Emokpae, Courage, MD  pantoprazole (PROTONIX) 40 MG tablet Take 1 tablet (40 mg total) by mouth daily. 06/05/23 06/04/24 Yes Emokpae, Courage, MD  polyethylene glycol (MIRALAX / GLYCOLAX) 17 g packet Take 17 g by mouth 2 (two) times daily. 06/05/23  Yes Emokpae, Courage, MD  rosuvastatin (CRESTOR) 20 MG tablet TAKE 1 TABLET EVERY DAY 04/09/23  Yes [name], MD  senna-docusate (SENOKOT-S) 8.6-50 MG  tablet Take 2 tablets by mouth at bedtime. 06/05/23  Yes Emokpae, Courage, MD  tolterodine (DETROL LA) 2 MG 24 hr capsule Take 2 mg by mouth daily. 05/09/23  Yes [provider]  zinc gluconate 50 MG tablet Take 50 mg by mouth daily.   Yes [provider]  Alcohol Swabs (B-D SINGLE USE SWABS REGULAR) PADS Use as directed once per day E11.9 12/29/20   [name], MD  Blood Glucose Calibration (TRUE METRIX LEVEL 1) Low SOLN Use as directed once per day E11.9 12/29/20   [name], MD  Blood Glucose Monitoring Suppl (TRUE METRIX METER) DEVI USE AS DIRECTED TO test blood sugar EVERY DAY 04/11/22   [provider]  glucose blood (TRUE METRIX BLOOD GLUCOSE TEST) test strip TEST BLOOD SUGAR TWO TIMES DAILY AS INSTRUCTED 09/14/22   [name], MD  TRUEplus Lancets 33G MISC Use as directed once per day E11.9 12/29/20   [name], MD      Allergies    Patient has no known allergies.    Review of Systems   Review of Systems  Physical Exam Updated Vital Signs BP 128/85   Pulse 100   Temp 97.7 F (36.5 C) (Oral)   Resp 17   Ht 6' (1.829 m)   Wt 80.4 kg   SpO2 95%   BMI 24.04 kg/m  Physical Exam Vitals and nursing note reviewed.  HENT:     Head: Normocephalic.  Pulmonary:     Breath sounds: No wheezing or rhonchi.  Abdominal:     Tenderness: There is abdominal tenderness.     Comments: Diffuse abdominal tenderness.  Points to right upper quadrant as level of both severe.  Musculoskeletal:     Comments: Selena Batten was moving.  Back pain.  Pain with moving at hips also.  Skin:    Capillary Refill: Capillary refill takes less than 2 seconds.  Neurological:     Mental Status: He is alert.     Comments: Awake and answers questions, however in pain.     ED Results / Procedures / Treatments   Labs (all labs ordered are listed, but only abnormal results are displayed) Labs Reviewed  COMPREHENSIVE METABOLIC PANEL - Abnormal; Notable for the following components:       Result Value   Sodium 127 (*)    Chloride 97 (*)    CO2 17 (*)    Glucose, Bld 287 (*)    Creatinine, Ser 1.44 (*)    Calcium 8.4 (*)    Total Protein 6.1 (*)    Albumin 2.5 (*)    GFR, Estimated 50 (*)    All other components within normal limits  CBC WITH DIFFERENTIAL/PLATELET - Abnormal; Notable for the  following components:   WBC 15.2 (*)    RBC 4.16 (*)    Hemoglobin 11.1 (*)    HCT 33.9 (*)    Neutro Abs 14.0 (*)    Lymphs Abs 0.3 (*)    Abs Immature Granulocytes 0.13 (*)    All other components within normal limits  LACTIC ACID, PLASMA - Abnormal; Notable for the following components:   Lactic Acid, Venous 3.1 (*)    All other components within normal limits  URINALYSIS, W/ REFLEX TO CULTURE (INFECTION SUSPECTED) - Abnormal; Notable for the following components:   Specific Gravity, Urine 1.044 (*)    Protein, ur 100 (*)    Bacteria, UA RARE (*)    All other components within normal limits  CULTURE, BLOOD (ROUTINE X 2)  CULTURE, BLOOD (ROUTINE X 2)  RESP PANEL BY RT-PCR (RSV, FLU A&B, COVID)  [id number]  URINE CULTURE  LACTIC ACID, PLASMA  LIPASE, BLOOD    EKG None  Radiology CT ABDOMEN PELVIS W CONTRAST Result Date: 06/14/2023 CLINICAL DATA:  Acute generalized abdominal pain. EXAM: CT ABDOMEN AND PELVIS WITH CONTRAST TECHNIQUE: Multidetector CT imaging of the abdomen and pelvis was performed using the standard protocol following bolus administration of intravenous contrast. RADIATION DOSE REDUCTION: This exam was performed according to the departmental dose-optimization program which includes automated exposure control, adjustment of the mA and/or kV according to patient size and/or use of iterative reconstruction technique. CONTRAST:  80mL OMNIPAQUE IOHEXOL 300 MG/ML  SOLN COMPARISON:  June 03, 2023.  December 15, 2021. FINDINGS: Lower chest: Small pericardial effusion is noted. Minimal bilateral posterior basilar subsegmental atelectasis. Hepatobiliary: Small  gallstone is noted with mild gallbladder distension. No biliary dilatation. Probable hepatic cysts are noted. Pancreas: Unremarkable. No pancreatic ductal dilatation or surrounding inflammatory changes. Spleen: Mild splenomegaly. Adrenals/Urinary Tract: Adrenal glands appear normal. Bilateral renal cysts are noted for which no further follow-up is required. No hydronephrosis or renal obstruction is noted. Urinary bladder is unremarkable. Stomach/Bowel: Stomach is within normal limits. Appendix appears normal. No evidence of bowel wall thickening, distention, or inflammatory changes. Vascular/Lymphatic: Aortic atherosclerosis. No enlarged abdominal or pelvic lymph nodes. Reproductive: Mild prostatic enlargement is again noted. Other: Small bilateral fat containing inguinal hernias are noted. No ascites is noted. Musculoskeletal: There appears to be irregular defect involving the posterior portion of the L4 vertebral body. IMPRESSION: Irregular defect is seen involving posterior portion of L4 vertebral body. Osteomyelitis cannot be excluded. MRI with and without gadolinium is recommended for further evaluation. Small pericardial effusion. Small gallstone with gallbladder distention. Mild splenomegaly. Mild prostatic enlargement. Aortic Atherosclerosis (ICD10-I70.0). Electronically Signed   By: Lupita Raider M.D.   On: 06/14/2023 20:57   DG Chest Portable 1 View Result Date: 06/14/2023 CLINICAL DATA:  Fever. EXAM: PORTABLE CHEST 1 VIEW COMPARISON:  April 26, 2023 FINDINGS: There is stable dual lead AICD positioning. The heart size and mediastinal contours are within normal limits. Both lungs are clear. Multilevel degenerative changes seen throughout the thoracic spine. IMPRESSION: No active disease. Electronically Signed   By: Aram Candela M.D.   On: 06/14/2023 20:26    Procedures Procedures    Medications Ordered in ED Medications  HYDROmorphone (DILAUDID) injection 0.5 mg (0.5 mg Intravenous Given  06/14/23 1747)  sodium chloride 0.9 % bolus 500 mL (0 mLs Intravenous Stopped 06/14/23 1937)  piperacillin-tazobactam (ZOSYN) IVPB 3.375 g (0 g Intravenous Stopped 06/14/23 2144)  iohexol (OMNIPAQUE) 300 MG/ML solution 80 mL (80 mLs Intravenous Contrast Given 06/14/23 1811)  ED Course/ Medical Decision Making/ A&P                                 Medical Decision Making Amount and/or Complexity of Data Reviewed Labs: ordered. Radiology: ordered.  Risk Prescription drug management. Decision regarding hospitalization.   Patient with abdominal pain.  Potential hip pain.  Reported fevers up to 105 at home.  Afebrile here.  Differential diagnosis includes several causes including intra-abdominal pathology.  Also a hip pathology.  Will get CT scan the abdomen pelvis and basic blood work.  Will give pain medicine.  Blood cultures and lactic acid ordered.  Reviewed discharge note.  Initial lactic acid elevated.  Fluid been given.  Antibiotics been given.  CT scan done to abdominal pain.  Discussed with Dr. Henreitta Leber prior to results back.  White count is elevated.  Blood cultures have been sent.  However CT scan shows potential spinal osteomyelitis. However patient has AICD.  Discussed with radiologist and they were unsure whether be able to get MRI.  Repeat lactic acid is normalized.  Have been attempting to get a hold of neurosurgery.  Still pending return page.  It has been almost 2 hours.  Care turned over to Dr.Kommor    CRITICAL CARE Performed by: Benjiman Core Total critical care time: 35 minutes Critical care time was exclusive of separately billable procedures and treating other patients. Critical care was necessary to treat or prevent imminent or life-threatening deterioration. Critical care was time spent personally by me on the following activities: development of treatment plan with patient and/or surrogate as well as nursing, discussions with consultants, evaluation of  patient's response to treatment, examination of patient, obtaining history from patient or surrogate, ordering and performing treatments and interventions, ordering and review of laboratory studies, ordering and review of radiographic studies, pulse oximetry and re-evaluation of patient's condition.  Megan from neurosurgery called back.  She had reviewed images.  Do not think there is abscess or anything that needs acute neurosurgical intervention.  Will need to find out MRI compatibility of pacemaker potentially through the [employer] rep but I think this can be done tomorrow.  From her standpoint can stay at [facility].  Will discuss with hospitalist for admission.         Final Clinical Impression(s) / ED Diagnoses Final diagnoses:  Midline low back pain without sciatica, unspecified chronicity    Rx / DC Orders ED Discharge Orders     None         Benjiman Core, MD 06/14/23 2330    Benjiman Core, MD 06/14/23 [phone]

## 2023-06-14 NOTE — ED Triage Notes (Signed)
 Pt arrived via POV from a pain clinic in [place] and was advised to come to the ER for further evaluation and treatment. Per Pts spouse, Dr Fuller Song told Pt and herself there is concern for Sepsis. Pt reports pain is so severe he is unable to ambulate.

## 2023-06-14 NOTE — ED Notes (Signed)
 Paged Neuro surgery through after hours service

## 2023-06-14 NOTE — Progress Notes (Signed)
 ED Pharmacy Antibiotic Sign Off An antibiotic consult was received from an ED provider for vancomycin per pharmacy dosing for osteomyelitis. A chart review was completed to assess appropriateness.   The following one time order(s) were placed:  Vancomycin 1750mg  x 1   Further antibiotic and/or antibiotic pharmacy consults should be ordered by the admitting provider if indicated.   Thank you for allowing pharmacy to be a part of this patient's care.   Marja Kays, [facility]  Clinical Pharmacist 06/14/23 11:35 PM

## 2023-06-15 ENCOUNTER — Inpatient Hospital Stay: Payer: Medicare HMO | Admitting: Internal Medicine

## 2023-06-15 DIAGNOSIS — M4620 Osteomyelitis of vertebra, site unspecified: Secondary | ICD-10-CM | POA: Diagnosis present

## 2023-06-15 DIAGNOSIS — I10 Essential (primary) hypertension: Secondary | ICD-10-CM | POA: Diagnosis not present

## 2023-06-15 DIAGNOSIS — E440 Moderate protein-calorie malnutrition: Secondary | ICD-10-CM | POA: Diagnosis present

## 2023-06-15 DIAGNOSIS — N400 Enlarged prostate without lower urinary tract symptoms: Secondary | ICD-10-CM

## 2023-06-15 DIAGNOSIS — T827XXA Infection and inflammatory reaction due to other cardiac and vascular devices, implants and grafts, initial encounter: Secondary | ICD-10-CM | POA: Diagnosis present

## 2023-06-15 DIAGNOSIS — E872 Acidosis, unspecified: Secondary | ICD-10-CM | POA: Insufficient documentation

## 2023-06-15 DIAGNOSIS — E782 Mixed hyperlipidemia: Secondary | ICD-10-CM | POA: Diagnosis present

## 2023-06-15 DIAGNOSIS — B955 Unspecified streptococcus as the cause of diseases classified elsewhere: Secondary | ICD-10-CM | POA: Diagnosis not present

## 2023-06-15 DIAGNOSIS — E871 Hypo-osmolality and hyponatremia: Secondary | ICD-10-CM | POA: Diagnosis present

## 2023-06-15 DIAGNOSIS — Z85828 Personal history of other malignant neoplasm of skin: Secondary | ICD-10-CM | POA: Diagnosis not present

## 2023-06-15 DIAGNOSIS — E1165 Type 2 diabetes mellitus with hyperglycemia: Secondary | ICD-10-CM | POA: Diagnosis present

## 2023-06-15 DIAGNOSIS — M4626 Osteomyelitis of vertebra, lumbar region: Secondary | ICD-10-CM | POA: Diagnosis present

## 2023-06-15 DIAGNOSIS — D649 Anemia, unspecified: Secondary | ICD-10-CM | POA: Diagnosis present

## 2023-06-15 DIAGNOSIS — I453 Trifascicular block: Secondary | ICD-10-CM | POA: Diagnosis present

## 2023-06-15 DIAGNOSIS — E876 Hypokalemia: Secondary | ICD-10-CM | POA: Diagnosis present

## 2023-06-15 DIAGNOSIS — I3139 Other pericardial effusion (noninflammatory): Secondary | ICD-10-CM | POA: Diagnosis present

## 2023-06-15 DIAGNOSIS — K0261 Dental caries on smooth surface limited to enamel: Secondary | ICD-10-CM | POA: Diagnosis not present

## 2023-06-15 DIAGNOSIS — E8809 Other disorders of plasma-protein metabolism, not elsewhere classified: Secondary | ICD-10-CM | POA: Diagnosis present

## 2023-06-15 DIAGNOSIS — B9689 Other specified bacterial agents as the cause of diseases classified elsewhere: Secondary | ICD-10-CM | POA: Diagnosis not present

## 2023-06-15 DIAGNOSIS — B954 Other streptococcus as the cause of diseases classified elsewhere: Secondary | ICD-10-CM | POA: Diagnosis present

## 2023-06-15 DIAGNOSIS — Z95810 Presence of automatic (implantable) cardiac defibrillator: Secondary | ICD-10-CM | POA: Diagnosis not present

## 2023-06-15 DIAGNOSIS — M25551 Pain in right hip: Secondary | ICD-10-CM | POA: Diagnosis not present

## 2023-06-15 DIAGNOSIS — I129 Hypertensive chronic kidney disease with stage 1 through stage 4 chronic kidney disease, or unspecified chronic kidney disease: Secondary | ICD-10-CM | POA: Diagnosis not present

## 2023-06-15 DIAGNOSIS — I330 Acute and subacute infective endocarditis: Secondary | ICD-10-CM | POA: Diagnosis present

## 2023-06-15 DIAGNOSIS — R001 Bradycardia, unspecified: Secondary | ICD-10-CM | POA: Diagnosis not present

## 2023-06-15 DIAGNOSIS — R9431 Abnormal electrocardiogram [ECG] [EKG]: Secondary | ICD-10-CM | POA: Diagnosis not present

## 2023-06-15 DIAGNOSIS — N189 Chronic kidney disease, unspecified: Secondary | ICD-10-CM | POA: Diagnosis not present

## 2023-06-15 DIAGNOSIS — I340 Nonrheumatic mitral (valve) insufficiency: Secondary | ICD-10-CM | POA: Diagnosis present

## 2023-06-15 DIAGNOSIS — E1122 Type 2 diabetes mellitus with diabetic chronic kidney disease: Secondary | ICD-10-CM | POA: Diagnosis not present

## 2023-06-15 DIAGNOSIS — R7881 Bacteremia: Secondary | ICD-10-CM | POA: Diagnosis present

## 2023-06-15 DIAGNOSIS — I38 Endocarditis, valve unspecified: Secondary | ICD-10-CM | POA: Diagnosis not present

## 2023-06-15 DIAGNOSIS — E1169 Type 2 diabetes mellitus with other specified complication: Secondary | ICD-10-CM | POA: Diagnosis present

## 2023-06-15 DIAGNOSIS — K219 Gastro-esophageal reflux disease without esophagitis: Secondary | ICD-10-CM | POA: Diagnosis present

## 2023-06-15 DIAGNOSIS — Z1152 Encounter for screening for COVID-19: Secondary | ICD-10-CM | POA: Diagnosis not present

## 2023-06-15 DIAGNOSIS — Z8616 Personal history of COVID-19: Secondary | ICD-10-CM | POA: Diagnosis not present

## 2023-06-15 DIAGNOSIS — G4733 Obstructive sleep apnea (adult) (pediatric): Secondary | ICD-10-CM | POA: Diagnosis not present

## 2023-06-15 DIAGNOSIS — R0602 Shortness of breath: Secondary | ICD-10-CM | POA: Diagnosis not present

## 2023-06-15 DIAGNOSIS — E46 Unspecified protein-calorie malnutrition: Secondary | ICD-10-CM

## 2023-06-15 DIAGNOSIS — Z79899 Other long term (current) drug therapy: Secondary | ICD-10-CM | POA: Diagnosis not present

## 2023-06-15 DIAGNOSIS — Z950 Presence of cardiac pacemaker: Secondary | ICD-10-CM | POA: Diagnosis not present

## 2023-06-15 LAB — BLOOD CULTURE ID PANEL (REFLEXED) - BCID2

## 2023-06-15 LAB — COMPREHENSIVE METABOLIC PANEL
ALT: 20 U/L (ref 0–44)
AST: 14 U/L — ABNORMAL LOW (ref 15–41)
Albumin: 2.3 g/dL — ABNORMAL LOW (ref 3.5–5.0)
Alkaline Phosphatase: 69 U/L (ref 38–126)
Anion gap: 7 (ref 5–15)
BUN: 25 mg/dL — ABNORMAL HIGH (ref 8–23)
CO2: 23 mmol/L (ref 22–32)
Calcium: 8.1 mg/dL — ABNORMAL LOW (ref 8.9–10.3)
Chloride: 102 mmol/L (ref 98–111)
Creatinine, Ser: 1.11 mg/dL (ref 0.61–1.24)
GFR, Estimated: >60 mL/min (ref >60)
Glucose, Bld: 179 mg/dL — ABNORMAL HIGH (ref 70–99)
Potassium: 3.8 mmol/L (ref 3.5–5.1)
Sodium: 132 mmol/L — ABNORMAL LOW (ref 135–145)
Total Bilirubin: 0.8 mg/dL (ref 0.0–1.2)
Total Protein: 5.3 g/dL — ABNORMAL LOW (ref 6.5–8.1)

## 2023-06-15 LAB — LACTIC ACID, PLASMA: Lactic Acid, Venous: 0.8 mmol/L (ref 0.5–1.9)

## 2023-06-15 LAB — CBC
HCT: 30.9 % — ABNORMAL LOW (ref 39.0–52.0)
Hemoglobin: 9.7 g/dL — ABNORMAL LOW (ref 13.0–17.0)
MCH: 26.2 pg (ref 26.0–34.0)
MCHC: 31.4 g/dL (ref 30.0–36.0)
MCV: 83.5 fL (ref 80.0–100.0)
Platelets: 272 10*3/uL (ref 150–400)
RBC: 3.7 MIL/uL — ABNORMAL LOW (ref 4.22–5.81)
RDW: 14.7 % (ref 11.5–15.5)
WBC: 12.5 10*3/uL — ABNORMAL HIGH (ref 4.0–10.5)
nRBC: 0 % (ref 0.0–0.2)

## 2023-06-15 LAB — OSMOLALITY: Osmolality: 289 mosm/kg (ref 275–295)

## 2023-06-15 LAB — SODIUM, URINE, RANDOM: Sodium, Ur: 40 mmol/L

## 2023-06-15 LAB — CBG MONITORING, ED
Glucose-Capillary: 147 mg/dL — ABNORMAL HIGH (ref 70–99)
Glucose-Capillary: 151 mg/dL — ABNORMAL HIGH (ref 70–99)

## 2023-06-15 LAB — GLUCOSE, CAPILLARY
Glucose-Capillary: 137 mg/dL — ABNORMAL HIGH (ref 70–99)
Glucose-Capillary: 211 mg/dL — ABNORMAL HIGH (ref 70–99)

## 2023-06-15 LAB — MAGNESIUM: Magnesium: 2 mg/dL (ref 1.7–2.4)

## 2023-06-15 LAB — OSMOLALITY, URINE: Osmolality, Ur: 487 mosm/kg (ref 300–900)

## 2023-06-15 LAB — PHOSPHORUS: Phosphorus: 3.8 mg/dL (ref 2.5–4.6)

## 2023-06-15 MED ORDER — ENOXAPARIN SODIUM 40 MG/0.4ML IJ SOSY
40.0000 mg | PREFILLED_SYRINGE | INTRAMUSCULAR | Status: DC
  Administered 2023-06-15: 40 mg via SUBCUTANEOUS
  Filled 2023-06-15: qty 0.4

## 2023-06-15 MED ORDER — NAFCILLIN SODIUM 2 G IJ SOLR: 2.0000 g | INTRAMUSCULAR | Status: DC

## 2023-06-15 MED ORDER — ACETAMINOPHEN 325 MG PO TABS
650.0000 mg | ORAL_TABLET | Freq: Four times a day (QID) | ORAL | Status: DC | PRN
  Administered 2023-06-15 – 2023-06-29 (×6): 650 mg via ORAL
  Filled 2023-06-15 (×6): qty 2

## 2023-06-15 MED ORDER — ROSUVASTATIN CALCIUM 20 MG PO TABS
20.0000 mg | ORAL_TABLET | Freq: Every day | ORAL | Status: DC
Start: 2023-06-15 — End: 2023-06-30
  Administered 2023-06-15 – 2023-06-30 (×16): 20 mg via ORAL
  Filled 2023-06-15 (×17): qty 1

## 2023-06-15 MED ORDER — NAFCILLIN SODIUM 2 G IJ SOLR
2.0000 g | INTRAMUSCULAR | Status: DC
  Filled 2023-06-15 (×19): qty 8

## 2023-06-15 MED ORDER — HYDROMORPHONE HCL 1 MG/ML IJ SOLN
0.5000 mg | INTRAMUSCULAR | Status: DC | PRN
  Administered 2023-06-15 – 2023-06-16 (×5): 0.5 mg via INTRAVENOUS
  Filled 2023-06-15 (×5): qty 0.5

## 2023-06-15 MED ORDER — AMLODIPINE BESYLATE 5 MG PO TABS
10.0000 mg | ORAL_TABLET | Freq: Every day | ORAL | Status: DC
  Administered 2023-06-15: 10 mg via ORAL
  Filled 2023-06-15: qty 2

## 2023-06-15 MED ORDER — AMLODIPINE BESYLATE 5 MG PO TABS
5.0000 mg | ORAL_TABLET | Freq: Every day | ORAL | Status: DC
  Administered 2023-06-16 – 2023-06-30 (×15): 5 mg via ORAL
  Filled 2023-06-15 (×11): qty 1
  Filled 2023-06-15: qty 2
  Filled 2023-06-15 (×3): qty 1

## 2023-06-15 MED ORDER — PROCHLORPERAZINE EDISYLATE 10 MG/2ML IJ SOLN: 10.0000 mg | Freq: Four times a day (QID) | INTRAMUSCULAR | Status: DC | PRN

## 2023-06-15 MED ORDER — IRBESARTAN 150 MG PO TABS
300.0000 mg | ORAL_TABLET | Freq: Every day | ORAL | Status: DC
Start: 2023-06-15 — End: 2023-06-15
  Administered 2023-06-15: 300 mg via ORAL
  Filled 2023-06-15: qty 2

## 2023-06-15 MED ORDER — SODIUM CHLORIDE 0.9 % IV SOLN
2.0000 g | INTRAVENOUS | Status: DC
  Administered 2023-06-15 – 2023-06-30 (×16): 2 g via INTRAVENOUS
  Filled 2023-06-15 (×17): qty 20

## 2023-06-15 MED ORDER — LORAZEPAM 2 MG/ML IJ SOLN: 1.0000 mg | Freq: Once | INTRAMUSCULAR | Status: DC

## 2023-06-15 MED ORDER — GLUCERNA SHAKE PO LIQD
237.0000 mL | Freq: Three times a day (TID) | ORAL | Status: DC
  Administered 2023-06-15 – 2023-06-30 (×32): 237 mL via ORAL
  Filled 2023-06-15 (×8): qty 237

## 2023-06-15 MED ORDER — ALFUZOSIN HCL ER 10 MG PO TB24
10.0000 mg | ORAL_TABLET | Freq: Every day | ORAL | Status: DC
  Administered 2023-06-15 – 2023-06-29 (×15): 10 mg via ORAL
  Filled 2023-06-15 (×15): qty 1

## 2023-06-15 MED ORDER — FENTANYL CITRATE PF 50 MCG/ML IJ SOSY
50.0000 ug | PREFILLED_SYRINGE | Freq: Once | INTRAMUSCULAR | Status: AC
  Administered 2023-06-15: 50 ug via INTRAVENOUS
  Filled 2023-06-15: qty 1

## 2023-06-15 MED ORDER — ACETAMINOPHEN 650 MG RE SUPP: 650.0000 mg | Freq: Four times a day (QID) | RECTAL | Status: DC | PRN

## 2023-06-15 MED ORDER — INSULIN ASPART 100 UNIT/ML IJ SOLN
0.0000 [IU] | Freq: Three times a day (TID) | INTRAMUSCULAR | Status: DC
  Administered 2023-06-15: 1 [IU] via SUBCUTANEOUS
  Administered 2023-06-15 – 2023-06-17 (×6): 2 [IU] via SUBCUTANEOUS
  Administered 2023-06-17: 1 [IU] via SUBCUTANEOUS
  Administered 2023-06-18: 2 [IU] via SUBCUTANEOUS
  Administered 2023-06-18: 5 [IU] via SUBCUTANEOUS
  Administered 2023-06-19 – 2023-06-22 (×9): 2 [IU] via SUBCUTANEOUS
  Administered 2023-06-23: 3 [IU] via SUBCUTANEOUS
  Administered 2023-06-24 (×2): 2 [IU] via SUBCUTANEOUS
  Administered 2023-06-24 – 2023-06-25 (×3): 1 [IU] via SUBCUTANEOUS
  Administered 2023-06-25: 2 [IU] via SUBCUTANEOUS
  Administered 2023-06-26: 1 [IU] via SUBCUTANEOUS
  Administered 2023-06-26: 2 [IU] via SUBCUTANEOUS
  Administered 2023-06-26: 1 [IU] via SUBCUTANEOUS
  Administered 2023-06-27: 3 [IU] via SUBCUTANEOUS
  Administered 2023-06-27: 1 [IU] via SUBCUTANEOUS
  Administered 2023-06-28: 2 [IU] via SUBCUTANEOUS
  Administered 2023-06-28 – 2023-06-30 (×5): 1 [IU] via SUBCUTANEOUS
  Filled 2023-06-15 (×2): qty 1

## 2023-06-15 MED ORDER — IRBESARTAN 75 MG PO TABS
75.0000 mg | ORAL_TABLET | Freq: Every day | ORAL | Status: DC
  Administered 2023-06-16 – 2023-06-30 (×15): 75 mg via ORAL
  Filled 2023-06-15 (×16): qty 1

## 2023-06-15 NOTE — Hospital Course (Addendum)
 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  He was recently admitted from 2/23 to 2/25 due to cholelithiasis without acute cholecystitis which was treated symptomatically.  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  He underwent pacemaker extraction on 3/13 and PICC line post-extraction  and planning for 6 weeks of antibiotics-(EOT 4/19)  followed by PO Cefadroxil x 2 weeks.Needs outpatient dental follow up.  Patient stable for discharge awaiting for placement to a skilled nursing facility. Subsequently patient did very well with ambulation, wife would like to take him home on 3/21  Consultation: Infectious disease EP cardiology

## 2023-06-15 NOTE — Plan of Care (Signed)

## 2023-06-15 NOTE — Plan of Care (Signed)
 Patient admitted to [facility], waiting to transfer to [facility]   Cardiac device here with bacteremia (strep species) and vertebral imaging om Got a dose of vanc/piptazo    -would continue abx with ceftriaxone -pending transfer will get full hx -suspect back involvement is strep as well and probably can defer ir procedure -tte -repeat bcx tomorrow

## 2023-06-15 NOTE — Progress Notes (Signed)
 Pt with an order for MRI. Pt has a Scientist, research (life sciences), 1 [employer] PM Lead and 1 [employer] PM lead. Due to mismatch of manufacturers, MRI conditions for safety are unable to be met. Ordering MD and current RN made aware pt is ineligible for any MRIs.

## 2023-06-15 NOTE — H&P (Signed)
 History and Physical    Patient: Grant Bowers ZOX:[id number] DOB: [birth year] DOA: 06/14/2023 DOS: the patient was seen and examined on 06/15/2023 PCP: [name], MD  Patient coming from: Home  Chief Complaint: No chief complaint on file.  HPI: Grant Bowers is a 78 y.o. male with medical history significant of  type 2 diabetes mellitus, essential hypertension, mixed hyperlipidemia, low back pain (on oxycodone at home.), trifascicular block s/p PPM,  OSA on CPAP, GERD who presents to the ED emergency department due to worsening low back pain.  Patient went to a pain clinic in [place] and was asked to go to an emergency department for further evaluation of back pain.  Patient endorsed difficulty in ambulation due to the low back pain.  Patient endorsed fever up to 105 F at home. He was recently admitted from 2/23 to 2/25 due to cholelithiasis without acute cholecystitis which was treated symptomatically.  ED Course:  In the emergency department, patient was tachycardic, other vital signs were within normal range.  Workup in the ED showed WBC of 15.2, normocytic anemia.  BMP shows sodium 127, potassium 4.0, chloride 97, bicarb 17, blood glucose 287, BUN 22, creatinine 1.44 (baseline creatinine at 1.0-1.2), albumin 2.5.  Lactic acid 3.1 > 1.6, urinalysis was unimpressive for UTI.  Influenza A, B, SARS coronavirus 2, RSV was negative.  Blood culture pending CT abdomen and pelvis with contrast was suggestive of vertebral osteomyelitis. Pain medication was given, patient was started on IV Vanco and Zosyn.  IV hydration was provided.  Hospitalist was asked to admit patient for further evaluation and management.  Review of Systems: Review of systems as noted in the HPI. All other systems reviewed and are negative.   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   [facility];    followed by EP cardiology--- dr Graciela Husbands   Diabetes mellitus without complication (HCC)    Dyspnea due to COVID-19    with exertion   ED (erectile dysfunction)    GERD (gastroesophageal reflux disease)    History of 2019 novel coronavirus disease (COVID-19) 05/07/2020   per pt mild symptoms all resolved with exception DOE   History of cardiac murmur as a child    History of kidney stones    Hyperlipidemia    Hypertension    followed by pcp and cardiology   Hypogonadism male    Mild atherosclerosis of both carotid arteries 09/23/2015   duplex doppler in epic bilateral ICA 1-39%   OSA on CPAP    per pt uses nightly   Post-COVID chronic dyspnea    had covid 05-07-2020;  per pt never had issue prior to covid ;  PFT 10-19-2020 normal per dr c. young   Right ureteral calculus 02/11/2010   Skin cancer    Calf   Trifascicular block    alternating with BBB per dr Graciela Husbands note;  s/p PPM 07-09-2020;   echo 07-29-2020 mild LVH, G1DD, ef 65-70%, moderate LAE, mild MR without stenosis   Past Surgical History:  Procedure Laterality Date   CARDIAC CATHETERIZATION  2009   per pt in [place] , told was normal   CARDIAC CATHETERIZATION  02/17/2010   @[facility]   by dr Clifton James--- no angiographic evidence cad, normal lvsf,  false positive stress test   COLONOSCOPY  12/07/2006   [facility] in [place]    CYSTOSCOPY  WITH RETROGRADE PYELOGRAM, URETEROSCOPY AND STENT PLACEMENT Right 10/29/2020   Procedure: CYSTOSCOPY WITH RETROGRADE PYELOGRAM, URETEROSCOPY AND STENT PLACEMENT;  Surgeon: Marcine Matar, MD;  Location: [facility];  Service: Urology;  Laterality: Right;   CYSTOSCOPY WITH RETROGRADE PYELOGRAM, URETEROSCOPY AND STENT PLACEMENT Right 11/09/2020   Procedure: CYSTOSCOPY WITH RIGHT  RETROGRADE PYELOGRAM, URETEROSCOPY AND STENT EXHANGE;  Surgeon: Belva Agee, MD;  Location: [facility];  Service: Urology;  Laterality: Right;  30 MINS   CYSTOSCOPY WITH URETEROSCOPY, STONE BASKETRY AND STENT  PLACEMENT  2012   CYSTOSCOPY/URETEROSCOPY/HOLMIUM LASER/STENT PLACEMENT Right 12/20/2021   Procedure: CYSTOSCOPY/URETEROSCOPY/HOLMIUM LASER/STENT PLACEMENT;  Surgeon: Belva Agee, MD;  Location: [facility];  Service: Urology;  Laterality: Right;  45 MINS   EXTRACORPOREAL SHOCK WAVE LITHOTRIPSY Right 04/29/2020   Procedure: EXTRACORPOREAL SHOCK WAVE LITHOTRIPSY (ESWL);  Surgeon: Noel Christmas, MD;  Location: [facility];  Service: Urology;  Laterality: Right;   HOLMIUM LASER APPLICATION Right 11/09/2020   Procedure: HOLMIUM LASER APPLICATION;  Surgeon: Belva Agee, MD;  Location: [facility];  Service: Urology;  Laterality: Right;   KNEE ARTHROSCOPY Left 2008   PACEMAKER IMPLANT N/A 07/09/2020   Procedure: PACEMAKER IMPLANT;  Surgeon: Duke Salvia, MD;  Location: [facility] INVASIVE CV LAB;  Service: Cardiovascular;  Laterality: N/A;    Social History:  reports that he has never smoked. He has never used smokeless tobacco. He reports that he does not currently use alcohol. He reports that he does not use drugs.   No Known Allergies  Family History  Problem Relation Age of Onset   Pancreatic cancer Mother    Diabetes Father    Diabetes Brother    Colon polyps Brother    Colon cancer Neg Hx    Esophageal cancer Neg Hx    Rectal cancer Neg Hx    Stomach cancer Neg Hx      Prior to Admission medications   Medication Sig Start Date End Date Taking? Authorizing Provider  acetaminophen (TYLENOL) 325 MG tablet Take 2 tablets (650 mg total) by mouth every 6 (six) hours as needed for mild pain (pain score 1-3) (or Fever >/= 101). Patient taking differently: Take 1,000 mg by mouth every 6 (six) hours as needed for mild pain (pain score 1-3) (or Fever >/= 101). 06/05/23  Yes Emokpae, Courage, MD  alfuzosin (UROXATRAL) 10 MG 24 hr tablet Take 10 mg by mouth at bedtime. 12/08/21  Yes [provider]  amLODipine (NORVASC) 10 MG tablet Take 1 tablet (10 mg  total) by mouth daily. 06/05/23  Yes Shon Hale, MD  aspirin EC 81 MG tablet Take 1 tablet (81 mg total) by mouth daily with breakfast. 06/05/23 06/04/24 Yes Emokpae, Courage, MD  Cholecalciferol (VITAMIN D3 SUPER STRENGTH) 50 MCG (2000 UT) TABS Take 2,000 Units by mouth daily.   Yes [provider]  hydrochlorothiazide (MICROZIDE) 12.5 MG capsule Take 1 capsule (12.5 mg total) by mouth daily. 06/05/23  Yes Emokpae, Courage, MD  irbesartan (AVAPRO) 300 MG tablet Take 1 tablet (300 mg total) by mouth daily. 06/05/23  Yes Emokpae, Courage, MD  metFORMIN (GLUCOPHAGE-XR) 500 MG 24 hr tablet TAKE 1 TABLET EVERY DAY WITH BREAKFAST 06/05/23  Yes Emokpae, Courage, MD  methocarbamol (ROBAXIN) 500 MG tablet Take 1 tablet (500 mg total) by mouth 3 (three) times daily. 06/05/23  Yes Emokpae, Courage, MD  mirtazapine (REMERON) 15 MG tablet Take 1 tablet (15 mg total) by mouth at bedtime. 06/05/23  Yes Emokpae,  Courage, MD  oxyCODONE (ROXICODONE) 5 MG immediate release tablet Take 1 tablet (5 mg total) by mouth every 6 (six) hours as needed for severe pain (pain score 7-10). 06/05/23  Yes Emokpae, Courage, MD  pantoprazole (PROTONIX) 40 MG tablet Take 1 tablet (40 mg total) by mouth daily. 06/05/23 06/04/24 Yes Emokpae, Courage, MD  polyethylene glycol (MIRALAX / GLYCOLAX) 17 g packet Take 17 g by mouth 2 (two) times daily. 06/05/23  Yes Emokpae, Courage, MD  rosuvastatin (CRESTOR) 20 MG tablet TAKE 1 TABLET EVERY DAY 04/09/23  Yes [name], MD  senna-docusate (SENOKOT-S) 8.6-50 MG tablet Take 2 tablets by mouth at bedtime. 06/05/23  Yes Emokpae, Courage, MD  tolterodine (DETROL LA) 2 MG 24 hr capsule Take 2 mg by mouth daily. 05/09/23  Yes [provider]  zinc gluconate 50 MG tablet Take 50 mg by mouth daily.   Yes [provider]  Alcohol Swabs (B-D SINGLE USE SWABS REGULAR) PADS Use as directed once per day E11.9 12/29/20   [name], MD  Blood Glucose Calibration (TRUE METRIX LEVEL  1) Low SOLN Use as directed once per day E11.9 12/29/20   [name], MD  Blood Glucose Monitoring Suppl (TRUE METRIX METER) DEVI USE AS DIRECTED TO test blood sugar EVERY DAY 04/11/22   [provider]  glucose blood (TRUE METRIX BLOOD GLUCOSE TEST) test strip TEST BLOOD SUGAR TWO TIMES DAILY AS INSTRUCTED 09/14/22   [name], MD  TRUEplus Lancets 33G MISC Use as directed once per day E11.9 12/29/20   [name], MD    Physical Exam: BP 125/68   Pulse 87   Temp 97.7 F (36.5 C) (Oral)   Resp 17   Ht 6' (1.829 m)   Wt 80.4 kg   SpO2 96%   BMI 24.04 kg/m   General: 78 y.o. year-old male well developed well nourished in no acute distress.  Alert and oriented x3. HEENT: NCAT, EOMI Neck: Supple, trachea medial Cardiovascular: Regular rate and rhythm with no rubs or gallops.  No thyromegaly or JVD noted.  No lower extremity edema. 2/4 pulses in all 4 extremities. Respiratory: Clear to auscultation with no wheezes or rales. Good inspiratory effort. Abdomen: Soft, nontender nondistended with normal bowel sounds x4 quadrants. Muskuloskeletal: Tender to palpation of medial L4/L5.  Pain on movement of hips. Neuro: CN II-XII intact, strength 5/5 x 4, sensation, reflexes intact Skin: No ulcerative lesions noted or rashes Psychiatry: Judgement and insight appear normal. Mood is appropriate for condition and setting          Labs on Admission:  Basic Metabolic Panel: Recent Labs  Lab 06/14/23 1725  NA 127*  K 4.0  CL 97*  CO2 17*  GLUCOSE 287*  BUN 22  CREATININE 1.44*  CALCIUM 8.4*   Liver Function Tests: Recent Labs  Lab 06/14/23 1725  AST 20  ALT 21  ALKPHOS 84  BILITOT 1.2  PROT 6.1*  ALBUMIN 2.5*   Recent Labs  Lab 06/14/23 1725  LIPASE 20   No results for input(s): "AMMONIA" in the last 168 hours. CBC: Recent Labs  Lab 06/14/23 1725  WBC 15.2*  NEUTROABS 14.0*  HGB 11.1*  HCT 33.9*  MCV 81.5  PLT 334   Cardiac Enzymes: No results for  input(s): "CKTOTAL", "CKMB", "CKMBINDEX", "TROPONINI" in the last 168 hours.  BNP (last 3 results) Recent Labs    04/26/23 1444  BNP 80.0    ProBNP (last 3 results) No  results for input(s): "PROBNP" in the last 8760 hours.  CBG: No results for input(s): "GLUCAP" in the last 168 hours.  Radiological Exams on Admission: CT ABDOMEN PELVIS W CONTRAST Result Date: 06/14/2023 CLINICAL DATA:  Acute generalized abdominal pain. EXAM: CT ABDOMEN AND PELVIS WITH CONTRAST TECHNIQUE: Multidetector CT imaging of the abdomen and pelvis was performed using the standard protocol following bolus administration of intravenous contrast. RADIATION DOSE REDUCTION: This exam was performed according to the departmental dose-optimization program which includes automated exposure control, adjustment of the mA and/or kV according to patient size and/or use of iterative reconstruction technique. CONTRAST:  80mL OMNIPAQUE IOHEXOL 300 MG/ML  SOLN COMPARISON:  June 03, 2023.  December 15, 2021. FINDINGS: Lower chest: Small pericardial effusion is noted. Minimal bilateral posterior basilar subsegmental atelectasis. Hepatobiliary: Small gallstone is noted with mild gallbladder distension. No biliary dilatation. Probable hepatic cysts are noted. Pancreas: Unremarkable. No pancreatic ductal dilatation or surrounding inflammatory changes. Spleen: Mild splenomegaly. Adrenals/Urinary Tract: Adrenal glands appear normal. Bilateral renal cysts are noted for which no further follow-up is required. No hydronephrosis or renal obstruction is noted. Urinary bladder is unremarkable. Stomach/Bowel: Stomach is within normal limits. Appendix appears normal. No evidence of bowel wall thickening, distention, or inflammatory changes. Vascular/Lymphatic: Aortic atherosclerosis. No enlarged abdominal or pelvic lymph nodes. Reproductive: Mild prostatic enlargement is again noted. Other: Small bilateral fat containing inguinal hernias are noted. No  ascites is noted. Musculoskeletal: There appears to be irregular defect involving the posterior portion of the L4 vertebral body. IMPRESSION: Irregular defect is seen involving posterior portion of L4 vertebral body. Osteomyelitis cannot be excluded. MRI with and without gadolinium is recommended for further evaluation. Small pericardial effusion. Small gallstone with gallbladder distention. Mild splenomegaly. Mild prostatic enlargement. Aortic Atherosclerosis (ICD10-I70.0). Electronically Signed   By: Lupita Raider M.D.   On: 06/14/2023 20:57   DG Chest Portable 1 View Result Date: 06/14/2023 CLINICAL DATA:  Fever. EXAM: PORTABLE CHEST 1 VIEW COMPARISON:  April 26, 2023 FINDINGS: There is stable dual lead AICD positioning. The heart size and mediastinal contours are within normal limits. Both lungs are clear. Multilevel degenerative changes seen throughout the thoracic spine. IMPRESSION: No active disease. Electronically Signed   By: Aram Candela M.D.   On: 06/14/2023 20:26    EKG: I independently viewed the EKG done and my findings are as followed: A-fib with RVR.  Prolonged QTc of 615 ms  Assessment/Plan Present on Admission:  Vertebral osteomyelitis (HCC)  Prolonged QT interval  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)  Type 2 diabetes mellitus with hyperglycemia (HCC)  Essential hypertension, benign  BPH (benign prostatic hyperplasia)  Mixed hyperlipidemia  Principal Problem:   Vertebral osteomyelitis (HCC) Active Problems:   Essential hypertension, benign   Prolonged QT interval   Mixed hyperlipidemia   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)   Type 2 diabetes mellitus with hyperglycemia (HCC)   BPH (benign prostatic hyperplasia)   Hyponatremia   Lactic acidosis  Vertebral osteomyelitis CT abdomen and pelvis with contrast was suggestive of vertebral osteomyelitis. Neurosurgery was consulted and recommended doing an MRI MRI with and  without contrast will be done in the morning (admitted to [facility] due to having pacemaker incompatible with MRI at [facility]). Consider consulting neurosurgery team for positive findings of an abscess Due to QT prolongation, IV vancomycin will be temporarily held, continue IV nafcillin with plan to transition back to vancomycin once the QT prolongation is corrected. Continue IV Dilaudid 0.5 mg every 3 hours as needed  Blood culture pending  Prolonged QT interval QTc 615 ms Avoid QT prolonging drugs Magnesium level will be checked Repeat EKG in the morning  Hyponatremia Sodium 127, this may be due to diuretic effect Continue gentle hydration Continue to monitor sodium with serial BMPs Urine osmolality, serum osmolality and urine sodium will be checked  Hypoalbuminemia possibly secondary to moderate protein calorie malnutrition Albumin 2.5, protein supplement will be provided  Lactic acidosis-resolved Lactic acid 3.1 > 1.6  T2DM with hyperglycemia  A1c 6.2 reflecting excellent diabetic control PTA- Hold Metformin Continue Novolog/Humalog Sliding scale insulin with Accu-Cheks/Fingersticks as ordered    Essential hypertension Continue Amlodipine and Avapro   BPH Continue alfuzosin   Mixed hyperlipidemia Continue Crestor           DVT prophylaxis: Lovenox  Code Status: Full code  Family Communication: None at bedside  Consults: None  Severity of Illness: The appropriate patient status for this patient is INPATIENT. Inpatient status is judged to be reasonable and necessary in order to provide the required intensity of service to ensure the patient's safety. The patient's presenting symptoms, physical exam findings, and initial radiographic and laboratory data in the context of their chronic comorbidities is felt to place them at high risk for further clinical deterioration. Furthermore, it is not anticipated that the patient will be medically stable for discharge from the hospital within  2 midnights of admission.   * I certify that at the point of admission it is my clinical judgment that the patient will require inpatient hospital care spanning beyond 2 midnights from the point of admission due to high intensity of service, high risk for further deterioration and high frequency of surveillance required.*  Author: Frankey Shown, DO 06/15/2023 4:11 AM  For on call review [email or web address].

## 2023-06-15 NOTE — TOC Initial Note (Signed)
 Transition of Care [facility]) - Initial/Assessment Note    Patient Details  Name: Grant Bowers MRN: [id number] Date of Birth: 09-15-1945  [facility]) CM/SW Contact:    Isabella Bowens, LCSWA Phone Number: 06/15/2023, 1:45 PM  Clinical Narrative:                 Patient is risk for readmission . Patient was admitted for vertebral osteomyelitis. Patient was here a week ago. Known to TOC. Patient was DC back home with spouse and had HHPT arranged with [facility].     Expected Discharge Plan: Home/Self Care Barriers to Discharge: Continued Medical Work up   Patient Goals and CMS Choice Patient states their goals for this hospitalization and ongoing recovery are:: return back home   Choice offered to / list presented to : Patient      Expected Discharge Plan and Services In-house Referral: Clinical Social Work   Post Acute Care Choice: Home Health Living arrangements for the past 2 months: Single Family Home                        Prior Living Arrangements/Services Living arrangements for the past 2 months: Single Family Home Lives with:: Spouse Patient language and need for interpreter reviewed:: Yes Do you feel safe going back to the place where you live?: Yes      Need for Family Participation in Patient Care: No (Comment) Care giver support system in place?: No (comment) Current home services: DME, Home PT Criminal Activity/Legal Involvement Pertinent to Current Situation/Hospitalization: No - Comment as needed  Activities of Daily Living      Permission Sought/Granted      Share Information with NAME: Grant Bowers     Permission granted to share info w Relationship: Patient     Emotional Assessment Appearance:: Appears stated age   Affect (typically observed): Appropriate Orientation: : Oriented to Self, Oriented to Place, Oriented to  Time, Oriented to Situation Alcohol / Substance Use: Not Applicable Psych Involvement: No (comment)  Admission  diagnosis:  Vertebral osteomyelitis (HCC) [M46.20] Patient Active Problem List   Diagnosis Date Noted   Vertebral osteomyelitis (HCC) 06/15/2023   Hyponatremia 06/15/2023   Lactic acidosis 06/15/2023   Recurrent falls 06/04/2023   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) 06/04/2023   Type 2 diabetes mellitus with hyperglycemia (HCC) 06/04/2023   BPH (benign prostatic hyperplasia) 06/04/2023   OSA on CPAP 06/04/2023   Gallstones 06/04/2023   Pain of right hip 06/04/2023   Abdominal pain 06/03/2023   Acute midline low back pain without sciatica 05/31/2023   Postviral syndrome 05/21/2023   Sinus tachycardia 05/21/2023   Vision loss, 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   Long term (current) use of oral hypoglycemic drugs 01/02/2023   CKD (chronic kidney disease) 04/25/2021   Diabetes (HCC) 03/19/2021   Trigger thumb of left hand 02/22/2021   Vitamin D deficiency 11/16/2020   Bradycardia 10/12/2020   Cardiac pacemaker - STJ 10/12/2020   Allergic rhinitis 10/31/2018   Paresthesias 08/08/2016   Mixed hyperlipidemia 09/26/2015   Carotid artery stenosis 09/23/2015   Abnormal blood chemistry 09/18/2014   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   Vertigo 09/13/2011   Encounter for well adult exam with abnormal findings 03/11/2011   Nonspecific abnormal results of cardiovascular function study 02/15/2010   RENAL CALCULUS 02/11/2010   Prolonged QT interval 02/11/2010   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   Essential hypertension, benign 04/08/2008   GERD 04/08/2008   PSA, INCREASED 04/08/2008  NEPHROLITHIASIS, HX OF 04/08/2008   PCP:  [name], MD Pharmacy:   [facility] - [place], [place] - [address] [place] [address] Phone: [phone] Fax: [phone]  [facility] - [address] [place] - [address] [place] [address] Phone: [phone] Fax: [phone]  [facility] (Now [facility]  [facility]) - [address] [place], [place] - [address] [place] [address] Phone: [phone] Fax: [phone]     Social Drivers of Health (SDOH) Social History: SDOH Screenings   Food Insecurity: No Food Insecurity (06/04/2023)  Housing: Low Risk  (06/04/2023)  Transportation Needs: No Transportation Needs (06/06/2023)  Utilities: Not At Risk (06/04/2023)  Depression (PHQ2-9): Low Risk  (04/17/2023)  Social Connections: Moderately Isolated (06/04/2023)  Tobacco Use: Low Risk  (06/14/2023)   SDOH Interventions:     Readmission Risk Interventions    06/15/2023    1:42 PM  Readmission Risk Prevention Plan  Transportation Screening Complete  HRI or Home Care Consult Complete  Social Work Consult for Recovery Care Planning/Counseling Complete  Palliative Care Screening Not Applicable  Medication Review Oceanographer) Complete

## 2023-06-15 NOTE — Progress Notes (Addendum)
 PROGRESS NOTE    Patient: Grant Bowers                            PCP: [name], MD                    DOB: 1945/07/28            DOA: 06/14/2023 [id number]:[id number]             DOS: 06/15/2023, 10:36 AM   LOS: 0 days   Date of Service: The patient was seen and examined on 06/15/2023  Subjective:   The patient was seen and examined this morning. Hemodynamically stable. Still complaining of back pain, hip pain, generalized weakness, difficulty ambulation No issues overnight .  Brief Narrative:   Grant Bowers is a 78 y.o. male with medical history significant of  type 2 diabetes mellitus, essential hypertension, mixed hyperlipidemia, low back pain (on oxycodone at home.), trifascicular block s/p PPM,  OSA on CPAP, GERD who presents to the ED emergency department due to worsening low back pain.  Patient went to a pain clinic in [place] and was asked to go to an emergency department for further evaluation of back pain.  Patient endorsed difficulty in ambulation due to the low back pain.  Patient endorsed fever up to 105 F at home.  He was recently admitted from 2/23 to 2/25 due to cholelithiasis without acute cholecystitis which was treated symptomatically.   ED Course:  Patient was tachycardic, other vital signs were within normal range.  Workup in the ED showed WBC of 15.2, normocytic anemia.  BMP shows sodium 127, potassium 4.0, chloride 97, bicarb 17, blood glucose 287, BUN 22, creatinine 1.44 (baseline creatinine at 1.0-1.2), albumin 2.5.   Lactic acid 3.1 > 1.6,  urinalysis was unimpressive for UTI.   Influenza A, B, SARS coronavirus 2, RSV was negative.   Blood culture pending CT abdomen and pelvis with contrast was suggestive of vertebral osteomyelitis. Pain medication was given, patient was started on IV Vanco and Zosyn.   IV hydration was provided.  Hospitalist was asked to admit patient for further evaluation and management        Assessment & Plan:   Principal  Problem:   Vertebral osteomyelitis (HCC) Active Problems:   Essential hypertension, benign   Prolonged QT interval   Mixed hyperlipidemia   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)   Type 2 diabetes mellitus with hyperglycemia (HCC)   BPH (benign prostatic hyperplasia)   Hyponatremia   Lactic acidosis   Vertebral osteomyelitis -Afebrile normotensive, improving leukocytosis -CT abdomen and pelvis with contrast was suggestive of vertebral osteomyelitis. Neurosurgery was consulted and recommended doing an MRI MRI with and without contrast will be done in the morning (admitted to [facility] due to having pacemaker incompatible with MRI at [facility]).  Consider consulting neurosurgery team for positive findings of an abscess  Due to QT prolongation, IV vancomycin will be temporarily held, continue IV nafcillin with plan to transition back to vancomycin once the QT prolongation is corrected.  -ID Dr. Renold Don consulted>>> antibiotics changed to Rocephin per ID  Continue IV Dilaudid 0.5 mg every 3 hours as needed Blood culture: 1/2 growing strep, repeating blood cultures 06/16/2023 Appreciate ID following closely   Prolonged QT interval QTc 615 ms Avoid QT prolonging drugs Checking electrolytes including potassium, magnesium Repeat EKG in the morning   Hyponatremia Sodium 127 >>> 132 -Improved with IV  fluids -Likely due to diuretic effect -on hold Urine osmolality, serum osmolality and urine sodium will be checked   Hypoalbuminemia  -Likely secondary to moderate protein calorie malnutrition Albumin 2.5, protein supplement will be provided   Lactic acidosis-resolved Lactic acid 3.1 > 1.6   T2DM with hyperglycemia  A1c 6.2 reflecting excellent diabetic control PTA- Hold Metformin Continue Novolog/Humalog Sliding scale insulin with Accu-Cheks/Fingersticks as ordered    Essential hypertension -Holding today's dose of BP meds as blood pressure mildly soft Continue Amlodipine and  Avapro   BPH Continue alfuzosin   Mixed hyperlipidemia Continue Crestor           ------------------------------------------------------------------------------------------------------------------------------------------Consults: ID  Nutritional status:  The patient's BMI is: Body mass index is 24.04 kg/m. I agree with the assessment and plan as outlined ---------------------------------------------------------------------------------------------------------------------------------------- Cultures; Blood Cultures x 2 >> 1/2 strep Repeat blood cultures 06/16/2023 -----------------------------------------------------------------------------------------------------------------------------------------  DVT prophylaxis:  SCDs Start: 06/15/23 0359   Code Status:   Code Status: Full Code  Family Communication: No family member present at bedside-  -Advance care planning has been discussed.   Admission status:   Status is: Inpatient Remains inpatient appropriate because: Needing IV antibiotics, MRI of the spine, ID consultation, for osteomyelitis, bacteremia   Disposition: From  - home             Planning for discharge in 2 days: to Home   Procedures:   No admission procedures for hospital encounter.   Antimicrobials:  Anti-infectives (From admission, onward)    Start     Dose/Rate Route Frequency Ordered Stop   06/16/23 0000  nafcillin injection 2 g  Status:  Discontinued       Note to Pharmacy: Hold for now per ID Dr. Renold Don Please call before initiating antibiotics again   2 g Intravenous Every 4 hours 06/15/23 0756 06/15/23 0947   06/15/23 1200  nafcillin injection 2 g  Status:  Discontinued        2 g Intravenous Every 4 hours 06/15/23 0346 06/15/23 0756   06/15/23 1000  cefTRIAXone (ROCEPHIN) 2 g in sodium chloride 0.9 % 100 mL IVPB        2 g 200 mL/hr over 30 Minutes Intravenous Every 24 hours 06/15/23 0947     06/14/23 2345  vancomycin (VANCOREADY) IVPB 1750 mg/350  mL        1,750 mg 175 mL/hr over 120 Minutes Intravenous  Once 06/14/23 2336 06/15/23 0228   06/14/23 1800  piperacillin-tazobactam (ZOSYN) IVPB 3.375 g        3.375 g 100 mL/hr over 30 Minutes Intravenous  Once 06/14/23 1749 06/14/23 2144        Medication:   alfuzosin  10 mg Oral QHS   amLODipine  10 mg Oral Daily   feeding supplement (GLUCERNA SHAKE)  237 mL Oral TID BM   insulin aspart  0-9 Units Subcutaneous TID WC   irbesartan  300 mg Oral Daily   rosuvastatin  20 mg Oral Daily    acetaminophen **OR** acetaminophen, HYDROmorphone (DILAUDID) injection, prochlorperazine   Objective:   Vitals:   06/15/23 0300 06/15/23 0400 06/15/23 0715 06/15/23 0755  BP: 125/68 136/71 119/79   Pulse: 87 97 95   Resp: 17 18 17    Temp:    97.9 F (36.6 C)  TempSrc:    Oral  SpO2: 96% 96% 96%   Weight:      Height:       No intake or output data in the 24 hours ending 06/15/23  1036 Filed Weights   06/14/23 1645  Weight: 80.4 kg     Physical examination:   Constitution:  Alert, cooperative, no distress,  Appears calm and comfortable  Psychiatric:   Normal and stable mood and affect, cognition intact,   HEENT:        Normocephalic, PERRL, otherwise with in Normal limits  Chest:         Chest symmetric Cardio vascular:  S1/S2, RRR, No murmure, No Rubs or Gallops  pulmonary: Clear to auscultation bilaterally, respirations unlabored, negative wheezes / crackles Abdomen: Soft, non-tender, non-distended, bowel sounds,no masses, no organomegaly Muscular skeletal: Subjective back pain, generalized global weakness Limited exam - in bed, able to move all 4 extremities,   Neuro: CNII-XII intact. , normal motor and sensation, reflexes intact  Extremities: No pitting edema lower extremities, +2 pulses  Skin: Dry, warm to touch, negative for any Rashes, No open wounds Wounds: per nursing  documentation   ------------------------------------------------------------------------------------------------------------------------------------------    LABs:     Latest Ref Rng & Units 06/15/2023    4:28 AM 06/14/2023    5:25 PM 06/04/2023    5:12 AM  CBC  WBC 4.0 - 10.5 K/uL 12.5  15.2  10.8   Hemoglobin 13.0 - 17.0 g/dL 9.7  16.1  09.6   Hematocrit 39.0 - 52.0 % 30.9  33.9  31.1   Platelets 150 - 400 K/uL 272  334  293       Latest Ref Rng & Units 06/15/2023    4:28 AM 06/14/2023    5:25 PM 06/04/2023    5:12 AM  CMP  Glucose 70 - 99 mg/dL 045  409  811   BUN 8 - 23 mg/dL 25  22  28    Creatinine 0.61 - 1.24 mg/dL 9.14  7.82  9.56   Sodium 135 - 145 mmol/L 132  127  129   Potassium 3.5 - 5.1 mmol/L 3.8  4.0  3.8   Chloride 98 - 111 mmol/L 102  97  98   CO2 22 - 32 mmol/L 23  17  22    Calcium 8.9 - 10.3 mg/dL 8.1  8.4  8.2   Total Protein 6.5 - 8.1 g/dL 5.3  6.1  5.8   Total Bilirubin 0.0 - 1.2 mg/dL 0.8  1.2  1.0   Alkaline Phos 38 - 126 U/L 69  84  82   AST 15 - 41 U/L 14  20  23    ALT 0 - 44 U/L 20  21  34        Micro Results Recent Results (from the past 240 hours)  Culture, blood (routine x 2)     Status: None (Preliminary result)   Collection Time: 06/14/23  5:25 PM   Specimen: Left Antecubital; Blood  Result Value Ref Range Status   Specimen Description   Final    LEFT ANTECUBITAL BOTTLES DRAWN AEROBIC AND ANAEROBIC Performed at [facility], [address], [place], [place] [address]    Special Requests   Final    Blood Culture results may not be optimal due to an inadequate volume of blood received in culture bottles Performed at [facility], [address], [place], [place] [address]    Culture  Setup Time   Final    GRAM POSITIVE COCCI IN BOTH AEROBIC AND ANAEROBIC BOTTLES Gram Stain Report Called to,Read Back By and Verified With: NICHOLS @ 0649 ON [id number] BY HENDERSON L CRITICAL RESULT CALLED TO, READ BACK BY AND VERIFIED  WITH: PHARMD E.SINCLAIR AT  0944 ON 06/15/2023 BY T.SAAD. Performed at [facility], [address] [place], [place] [address]    Culture GRAM POSITIVE COCCI  Final   Report Status PENDING  Incomplete  Culture, blood (routine x 2)     Status: None (Preliminary result)   Collection Time: 06/14/23  5:25 PM   Specimen: BLOOD LEFT FOREARM  Result Value Ref Range Status   Specimen Description   Final    BLOOD LEFT FOREARM BOTTLES DRAWN AEROBIC AND ANAEROBIC Performed at [facility], [address] [place], [place] [address]    Special Requests   Final    Blood Culture adequate volume Performed at [facility], [address] [place], [place] [address]    Culture  Setup Time   Final    GRAM POSITIVE COCCI IN BOTH AEROBIC AND ANAEROBIC BOTTLES Gram Stain Report Called to,Read Back By and Verified With: NICHOLS @ 0649 ON [id number] BY HENDERSON L CRITICAL VALUE NOTED.  VALUE IS CONSISTENT WITH PREVIOUSLY REPORTED AND CALLED VALUE. Performed at [facility], [address] [place], [place] [address]    Culture GRAM POSITIVE COCCI  Final   Report Status PENDING  Incomplete  Blood Culture ID Panel (Reflexed)     Status: Abnormal   Collection Time: 06/14/23  5:25 PM  Result Value Ref Range Status   Enterococcus faecalis NOT DETECTED NOT DETECTED Final   Enterococcus Faecium NOT DETECTED NOT DETECTED Final   Listeria monocytogenes NOT DETECTED NOT DETECTED Final   Staphylococcus species NOT DETECTED NOT DETECTED Final   Staphylococcus aureus (BCID) NOT DETECTED NOT DETECTED Final   Staphylococcus epidermidis NOT DETECTED NOT DETECTED Final   Staphylococcus lugdunensis NOT DETECTED NOT DETECTED Final   Streptococcus species DETECTED (A) NOT DETECTED Final    Comment: Not Enterococcus species, Streptococcus agalactiae, Streptococcus pyogenes, or Streptococcus pneumoniae. CRITICAL RESULT CALLED TO, READ BACK BY AND VERIFIED WITH: PHARMD E.SINCLAIR AT 0944 ON 06/15/2023 BY T.SAAD.    Streptococcus agalactiae  NOT DETECTED NOT DETECTED Final   Streptococcus pneumoniae NOT DETECTED NOT DETECTED Final   Streptococcus pyogenes NOT DETECTED NOT DETECTED Final   A.calcoaceticus-baumannii NOT DETECTED NOT DETECTED Final   Bacteroides fragilis NOT DETECTED NOT DETECTED Final   Enterobacterales NOT DETECTED NOT DETECTED Final   Enterobacter cloacae complex NOT DETECTED NOT DETECTED Final   Escherichia coli NOT DETECTED NOT DETECTED Final   Klebsiella aerogenes NOT DETECTED NOT DETECTED Final   Klebsiella oxytoca NOT DETECTED NOT DETECTED Final   Klebsiella pneumoniae NOT DETECTED NOT DETECTED Final   Proteus species NOT DETECTED NOT DETECTED Final   Salmonella species NOT DETECTED NOT DETECTED Final   Serratia marcescens NOT DETECTED NOT DETECTED Final   Haemophilus influenzae NOT DETECTED NOT DETECTED Final   Neisseria meningitidis NOT DETECTED NOT DETECTED Final   Pseudomonas aeruginosa NOT DETECTED NOT DETECTED Final   Stenotrophomonas maltophilia NOT DETECTED NOT DETECTED Final   Candida albicans NOT DETECTED NOT DETECTED Final   Candida auris NOT DETECTED NOT DETECTED Final   Candida glabrata NOT DETECTED NOT DETECTED Final   Candida krusei NOT DETECTED NOT DETECTED Final   Candida parapsilosis NOT DETECTED NOT DETECTED Final   Candida tropicalis NOT DETECTED NOT DETECTED Final   Cryptococcus neoformans/gattii NOT DETECTED NOT DETECTED Final    Comment: Performed at [facility], [address] [place], [place] [address]  Resp panel by RT-PCR (RSV, Flu A&B, Covid) Anterior Nasal Swab  Status: None   Collection Time: 06/14/23  5:53 PM   Specimen: Anterior Nasal Swab  Result Value Ref Range Status   SARS Coronavirus 2 by RT PCR NEGATIVE NEGATIVE Final    Comment: (NOTE) SARS-CoV-2 target nucleic acids are NOT DETECTED.  The SARS-CoV-2 RNA is generally detectable in upper respiratory specimens during the acute phase of infection. The lowest concentration of SARS-CoV-2 viral copies  this assay can detect is 138 copies/mL. A negative result does not preclude SARS-Cov-2 infection and should not be used as the sole basis for treatment or other patient management decisions. A negative result may occur with  improper specimen collection/handling, submission of specimen other than nasopharyngeal swab, presence of viral mutation(s) within the areas targeted by this assay, and inadequate number of viral copies(<138 copies/mL). A negative result must be combined with clinical observations, patient history, and epidemiological information. The expected result is Negative.  Fact Sheet for Patients:  BloggerCourse.com  Fact Sheet for Healthcare Providers:  SeriousBroker.it  This test is no t yet approved or cleared by the [place] FDA and  has been authorized for detection and/or diagnosis of SARS-CoV-2 by FDA under an Emergency Use Authorization (EUA). This EUA will remain  in effect (meaning this test can be used) for the duration of the COVID-19 declaration under Section 564(b)(1) of the Act, 21 U.S.C.section 360bbb-3(b)(1), unless the authorization is terminated  or revoked sooner.       Influenza A by PCR NEGATIVE NEGATIVE Final   Influenza B by PCR NEGATIVE NEGATIVE Final    Comment: (NOTE) The Xpert Xpress SARS-CoV-2/FLU/RSV plus assay is intended as an aid in the diagnosis of influenza from Nasopharyngeal swab specimens and should not be used as a sole basis for treatment. Nasal washings and aspirates are unacceptable for Xpert Xpress SARS-CoV-2/FLU/RSV testing.  Fact Sheet for Patients: BloggerCourse.com  Fact Sheet for Healthcare Providers: SeriousBroker.it  This test is not yet approved or cleared by the [place] FDA and has been authorized for detection and/or diagnosis of SARS-CoV-2 by FDA under an Emergency Use Authorization (EUA). This EUA  will remain in effect (meaning this test can be used) for the duration of the COVID-19 declaration under Section 564(b)(1) of the Act, 21 U.S.C. section 360bbb-3(b)(1), unless the authorization is terminated or revoked.     Resp Syncytial Virus by PCR NEGATIVE NEGATIVE Final    Comment: (NOTE) Fact Sheet for Patients: BloggerCourse.com  Fact Sheet for Healthcare Providers: SeriousBroker.it  This test is not yet approved or cleared by the [place] FDA and has been authorized for detection and/or diagnosis of SARS-CoV-2 by FDA under an Emergency Use Authorization (EUA). This EUA will remain in effect (meaning this test can be used) for the duration of the COVID-19 declaration under Section 564(b)(1) of the Act, 21 U.S.C. section 360bbb-3(b)(1), unless the authorization is terminated or revoked.  Performed at [facility], [address], [place], [place] [address]     Radiology Reports CT ABDOMEN PELVIS W CONTRAST Result Date: 06/14/2023 CLINICAL DATA:  Acute generalized abdominal pain. EXAM: CT ABDOMEN AND PELVIS WITH CONTRAST TECHNIQUE: Multidetector CT imaging of the abdomen and pelvis was performed using the standard protocol following bolus administration of intravenous contrast. RADIATION DOSE REDUCTION: This exam was performed according to the departmental dose-optimization program which includes automated exposure control, adjustment of the mA and/or kV according to patient size and/or use of iterative reconstruction technique. CONTRAST:  80mL OMNIPAQUE IOHEXOL 300 MG/ML  SOLN COMPARISON:  June 03, 2023.  December 15, 2021. FINDINGS: Lower chest: Small pericardial effusion is noted. Minimal bilateral posterior basilar subsegmental atelectasis. Hepatobiliary: Small gallstone is noted with mild gallbladder distension. No biliary dilatation. Probable hepatic cysts are noted. Pancreas: Unremarkable. No pancreatic ductal dilatation  or surrounding inflammatory changes. Spleen: Mild splenomegaly. Adrenals/Urinary Tract: Adrenal glands appear normal. Bilateral renal cysts are noted for which no further follow-up is required. No hydronephrosis or renal obstruction is noted. Urinary bladder is unremarkable. Stomach/Bowel: Stomach is within normal limits. Appendix appears normal. No evidence of bowel wall thickening, distention, or inflammatory changes. Vascular/Lymphatic: Aortic atherosclerosis. No enlarged abdominal or pelvic lymph nodes. Reproductive: Mild prostatic enlargement is again noted. Other: Small bilateral fat containing inguinal hernias are noted. No ascites is noted. Musculoskeletal: There appears to be irregular defect involving the posterior portion of the L4 vertebral body. IMPRESSION: Irregular defect is seen involving posterior portion of L4 vertebral body. Osteomyelitis cannot be excluded. MRI with and without gadolinium is recommended for further evaluation. Small pericardial effusion. Small gallstone with gallbladder distention. Mild splenomegaly. Mild prostatic enlargement. Aortic Atherosclerosis (ICD10-I70.0). Electronically Signed   By: Lupita Raider M.D.   On: 06/14/2023 20:57   DG Chest Portable 1 View Result Date: 06/14/2023 CLINICAL DATA:  Fever. EXAM: PORTABLE CHEST 1 VIEW COMPARISON:  April 26, 2023 FINDINGS: There is stable dual lead AICD positioning. The heart size and mediastinal contours are within normal limits. Both lungs are clear. Multilevel degenerative changes seen throughout the thoracic spine. IMPRESSION: No active disease. Electronically Signed   By: Aram Candela M.D.   On: 06/14/2023 20:26    SIGNED: Kendell Bane, MD, FHM. FAAFP. [facility] - [facility] Time spent - 55 min.  In seeing, evaluating and examining the patient. Reviewing medical records, labs, drawn plan of care. [facility],  Pager (please use [email or web address] to page/ text) Please use Epic Secure Chat for  non-urgent communication (7AM-7PM)  If 7PM-7AM, please contact night-coverage [email or web address], 06/15/2023, 10:36 AM

## 2023-06-15 NOTE — ED Notes (Signed)
 [facility] called to transport patient

## 2023-06-15 NOTE — Progress Notes (Signed)
 PHARMACY - PHYSICIAN COMMUNICATION CRITICAL VALUE ALERT - BLOOD CULTURE IDENTIFICATION (BCID)  Grant Bowers is an 78 y.o. male who presented to [facility] on 06/14/2023 with a chief complaint of worsening lower back pain.   Assessment:  78 year old male admitted with lower back pain. Recent admission for cholelithiasis from 2/23 to 2/25.  Imaging with possible L4 vertebral osteomyelitis. Now with strep species in 2/2 blood cultures.   Name of physician (or Provider) Contacted: Vu (ID physician)   Current antibiotics: None   Changes to prescribed antibiotics recommended:  Start ceftriaxone 2 gm IV q 24 hours   Results for orders placed or performed during the hospital encounter of 06/14/23  Blood Culture ID Panel (Reflexed) (Collected: 06/14/2023  5:25 PM)  Result Value Ref Range   Enterococcus faecalis NOT DETECTED NOT DETECTED   Enterococcus Faecium NOT DETECTED NOT DETECTED   Listeria monocytogenes NOT DETECTED NOT DETECTED   Staphylococcus species NOT DETECTED NOT DETECTED   Staphylococcus aureus (BCID) NOT DETECTED NOT DETECTED   Staphylococcus epidermidis NOT DETECTED NOT DETECTED   Staphylococcus lugdunensis NOT DETECTED NOT DETECTED   Streptococcus species DETECTED (A) NOT DETECTED   Streptococcus agalactiae NOT DETECTED NOT DETECTED   Streptococcus pneumoniae NOT DETECTED NOT DETECTED   Streptococcus pyogenes NOT DETECTED NOT DETECTED   A.calcoaceticus-baumannii NOT DETECTED NOT DETECTED   Bacteroides fragilis NOT DETECTED NOT DETECTED   Enterobacterales NOT DETECTED NOT DETECTED   Enterobacter cloacae complex NOT DETECTED NOT DETECTED   Escherichia coli NOT DETECTED NOT DETECTED   Klebsiella aerogenes NOT DETECTED NOT DETECTED   Klebsiella oxytoca NOT DETECTED NOT DETECTED   Klebsiella pneumoniae NOT DETECTED NOT DETECTED   Proteus species NOT DETECTED NOT DETECTED   Salmonella species NOT DETECTED NOT DETECTED   Serratia marcescens NOT DETECTED NOT DETECTED    Haemophilus influenzae NOT DETECTED NOT DETECTED   Neisseria meningitidis NOT DETECTED NOT DETECTED   Pseudomonas aeruginosa NOT DETECTED NOT DETECTED   Stenotrophomonas maltophilia NOT DETECTED NOT DETECTED   Candida albicans NOT DETECTED NOT DETECTED   Candida auris NOT DETECTED NOT DETECTED   Candida glabrata NOT DETECTED NOT DETECTED   Candida krusei NOT DETECTED NOT DETECTED   Candida parapsilosis NOT DETECTED NOT DETECTED   Candida tropicalis NOT DETECTED NOT DETECTED   Cryptococcus neoformans/gattii NOT DETECTED NOT DETECTED    Sharin Mons, PharmD, BCPS, BCIDP Infectious Diseases Clinical Pharmacist Phone: [phone] 06/15/2023  9:56 AM

## 2023-06-15 NOTE — ED Notes (Signed)
 ED TO INPATIENT HANDOFF REPORT  ED Nurse Name and Phone #: [phone]  S Name/Age/Gender Grant Bowers 78 y.o. male Room/Bed: APA02/APA02  Code Status   Code Status: Full Code  Home/SNF/Other Home Patient oriented to: self, place, time, and situation Is this baseline? 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 Per Pts spouse, Dr Fuller Song told Pt and herself there is concern for Sepsis. Pt reports pain is so severe he is unable to ambulate.    Allergies No Known Allergies  Level of Care/Admitting Diagnosis ED Disposition     ED Disposition  Admit   Condition  --   Comment  Hospital Area: [facility] [[id number]]  Level of Care: Med-Surg [16]  May admit patient to [facility] or [facility] if equivalent level of care is available:: Yes  Covid Evaluation: Asymptomatic - no recent exposure (last 10 days) testing not required  Diagnosis: Vertebral osteomyelitis [facility]) [[id number]]  Admitting Physician: Frankey Shown [[id number]]  Attending Physician: Frankey Shown [[id number]]  Certification:: I certify this patient will need inpatient services for at least 2 midnights  Expected Medical Readiness: 06/18/2023          B Medical/Surgery History 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   [facility];   followed by EP cardiology--- dr Graciela Husbands   Diabetes mellitus without complication (HCC)    Dyspnea due to COVID-19    with exertion   ED (erectile dysfunction)    GERD (gastroesophageal reflux disease)    History of 2019 novel coronavirus disease (COVID-19) 05/07/2020   per pt mild symptoms all resolved with exception DOE   History of cardiac murmur as  a child    History of kidney stones    Hyperlipidemia    Hypertension    followed by pcp and cardiology   Hypogonadism male    Mild atherosclerosis of both carotid arteries 09/23/2015   duplex doppler in epic bilateral ICA 1-39%   OSA on CPAP    per pt uses nightly   Post-COVID chronic dyspnea    had covid 05-07-2020;  per pt never had issue prior to covid ;  PFT 10-19-2020 normal per dr c. young   Right ureteral calculus 02/11/2010   Skin cancer    Calf   Trifascicular block    alternating with BBB per dr Graciela Husbands note;  s/p PPM 07-09-2020;   echo 07-29-2020 mild LVH, G1DD, ef 65-70%, moderate LAE, mild MR without stenosis   Past Surgical History:  Procedure Laterality Date   CARDIAC CATHETERIZATION  2009   per pt in [place] , told was normal   CARDIAC CATHETERIZATION  02/17/2010   @[facility]   by dr Clifton James--- no angiographic evidence cad, normal lvsf,  false positive stress test   COLONOSCOPY  12/07/2006   [facility] in [place]    CYSTOSCOPY WITH RETROGRADE PYELOGRAM, URETEROSCOPY AND STENT PLACEMENT Right 10/29/2020   Procedure: CYSTOSCOPY WITH RETROGRADE PYELOGRAM, URETEROSCOPY AND STENT PLACEMENT;  Surgeon: Marcine Matar, MD;  Location: [facility];  Service: Urology;  Laterality: Right;   CYSTOSCOPY WITH RETROGRADE PYELOGRAM, URETEROSCOPY AND STENT PLACEMENT Right 11/09/2020   Procedure: CYSTOSCOPY WITH RIGHT  RETROGRADE PYELOGRAM, URETEROSCOPY AND STENT  EXHANGE;  Surgeon: Belva Agee, MD;  Location: [facility];  Service: Urology;  Laterality: Right;  30 MINS   CYSTOSCOPY WITH URETEROSCOPY, STONE BASKETRY AND STENT PLACEMENT  2012   CYSTOSCOPY/URETEROSCOPY/HOLMIUM LASER/STENT PLACEMENT Right 12/20/2021   Procedure: CYSTOSCOPY/URETEROSCOPY/HOLMIUM LASER/STENT PLACEMENT;  Surgeon: Belva Agee, MD;  Location: [facility];  Service: Urology;  Laterality: Right;  45 MINS   EXTRACORPOREAL SHOCK WAVE LITHOTRIPSY Right 04/29/2020   Procedure: EXTRACORPOREAL SHOCK WAVE  LITHOTRIPSY (ESWL);  Surgeon: Noel Christmas, MD;  Location: [facility];  Service: Urology;  Laterality: Right;   HOLMIUM LASER APPLICATION Right 11/09/2020   Procedure: HOLMIUM LASER APPLICATION;  Surgeon: Belva Agee, MD;  Location: [facility];  Service: Urology;  Laterality: Right;   KNEE ARTHROSCOPY Left 2008   PACEMAKER IMPLANT N/A 07/09/2020   Procedure: PACEMAKER IMPLANT;  Surgeon: Duke Salvia, MD;  Location: [facility];  Service: Cardiovascular;  Laterality: N/A;     A IV Location/Drains/Wounds Patient Lines/Drains/Airways Status     Active Line/Drains/Airways     Name Placement date Placement time Site Days   Peripheral IV 06/14/23 20 G 1" Left Antecubital 06/14/23  1739  Antecubital  1   Ureteral Drain/Stent Right ureter 6 Fr. 12/20/21  1610  Right ureter  542            Intake/Output Last 24 hours No intake or output data in the 24 hours ending 06/15/23 1508  Labs/Imaging Results for orders placed or performed during the hospital encounter of 06/14/23 (from the past 48 hours)  Culture, blood (routine x 2)     Status: None (Preliminary result)   Collection Time: 06/14/23  5:25 PM   Specimen: Left Antecubital; Blood  Result Value Ref Range   Specimen Description      LEFT ANTECUBITAL BOTTLES DRAWN AEROBIC AND ANAEROBIC Performed at [facility], [address] [place], [place] [address]    Special Requests      Blood Culture results may not be optimal due to an inadequate volume of blood received in culture bottles Performed at [facility], [address] [place], [place] [address]    Culture  Setup Time      GRAM POSITIVE COCCI IN BOTH AEROBIC AND ANAEROBIC BOTTLES Gram Stain Report Called to,Read Back By and Verified With: NICHOLS @ 0649 ON [id number] BY HENDERSON L CRITICAL RESULT CALLED TO, READ BACK BY AND VERIFIED WITH: PHARMD E.SINCLAIR AT 0944 ON 06/15/2023 BY T.SAAD. Performed at [facility],  [address], [place], [place] [address]    Culture GRAM POSITIVE COCCI    Report Status PENDING   Culture, blood (routine x 2)     Status: None (Preliminary result)   Collection Time: 06/14/23  5:25 PM   Specimen: BLOOD LEFT FOREARM  Result Value Ref Range   Specimen Description      BLOOD LEFT FOREARM BOTTLES DRAWN AEROBIC AND ANAEROBIC Performed at [facility], [address], [place], [place] [address]    Special Requests      Blood Culture adequate volume Performed at [facility], [address] [place], [place] [address]    Culture  Setup Time      GRAM POSITIVE COCCI IN BOTH AEROBIC AND ANAEROBIC BOTTLES Gram Stain Report Called to,Read Back By and Verified With: NICHOLS @ 0649 ON [id number] BY HENDERSON L CRITICAL VALUE NOTED.  VALUE IS CONSISTENT WITH PREVIOUSLY REPORTED AND CALLED VALUE. Performed at [facility], [address], [place], [place] [address]    Culture GRAM POSITIVE COCCI    Report Status PENDING   Comprehensive metabolic panel     Status: Abnormal   Collection Time: 06/14/23  5:25 PM  Result Value Ref Range   Sodium 127 (L) 135 - 145 mmol/L   Potassium 4.0 3.5 - 5.1 mmol/L   Chloride 97 (L) 98 - 111 mmol/L   CO2 17 (L) 22 - 32 mmol/L   Glucose, Bld 287 (H) 70 - 99 mg/dL    Comment: Glucose reference range applies only to samples taken after fasting for at least 8 hours.   BUN 22 8 - 23 mg/dL   Creatinine, Ser 4.40 (H) 0.61 - 1.24 mg/dL   Calcium 8.4 (L) 8.9 - 10.3 mg/dL   Total Protein 6.1 (L) 6.5 - 8.1 g/dL   Albumin 2.5 (L) 3.5 - 5.0 g/dL   AST 20 15 - 41 U/L   ALT 21 0 - 44 U/L   Alkaline Phosphatase 84 38 - 126 U/L   Total Bilirubin 1.2 0.0 - 1.2 mg/dL   GFR, Estimated 50 (L) >60 mL/min    Comment: (NOTE) Calculated using the CKD-EPI Creatinine Equation (2021)    Anion gap 13 5 - 15    Comment: Performed at [facility], [address], [place], [place] [address]  CBC with Differential     Status: Abnormal   Collection Time: 06/14/23   5:25 PM  Result Value Ref Range   WBC 15.2 (H) 4.0 - 10.5 K/uL   RBC 4.16 (L) 4.22 - 5.81 MIL/uL   Hemoglobin 11.1 (L) 13.0 - 17.0 g/dL   HCT 59.5 (L) 63.8 - 75.6 %   MCV 81.5 80.0 - 100.0 fL   MCH 26.7 26.0 - 34.0 pg   MCHC 32.7 30.0 - 36.0 g/dL   RDW 43.3 29.5 - 18.8 %   Platelets 334 150 - 400 K/uL   nRBC 0.0 0.0 - 0.2 %   Neutrophils Relative % 92 %   Neutro Abs 14.0 (H) 1.7 - 7.7 K/uL   Lymphocytes Relative 2 %   Lymphs Abs 0.3 (L) 0.7 - 4.0 K/uL   Monocytes Relative 5 %   Monocytes Absolute 0.7 0.1 - 1.0 K/uL   Eosinophils Relative 0 %   Eosinophils Absolute 0.0 0.0 - 0.5 K/uL   Basophils Relative 0 %   Basophils Absolute 0.0 0.0 - 0.1 K/uL   Immature Granulocytes 1 %   Abs Immature Granulocytes 0.13 (H) 0.00 - 0.07 K/uL    Comment: Performed at [facility], [address] [place], [place] [address]  Lactic acid, plasma     Status: Abnormal   Collection Time: 06/14/23  5:25 PM  Result Value Ref Range   Lactic Acid, Venous 3.1 (HH) 0.5 - 1.9 mmol/L    Comment: CRITICAL RESULT CALLED TO, READ BACK BY AND VERIFIED WITH EANES,M ON 06/14/23 AT 1837 BY LOY,C Performed at [facility], [address], [place], [place] [address]   Lipase, blood     Status: None   Collection Time: 06/14/23  5:25 PM  Result Value Ref Range   Lipase 20 11 - 51 U/L    Comment: Performed at [facility], [address] [place], [place] [address]  Blood Culture ID Panel (Reflexed)     Status: Abnormal   Collection Time: 06/14/23  5:25 PM  Result Value Ref Range   Enterococcus faecalis NOT DETECTED NOT DETECTED   Enterococcus Faecium  NOT DETECTED NOT DETECTED   Listeria monocytogenes NOT DETECTED NOT DETECTED   Staphylococcus species NOT DETECTED NOT DETECTED   Staphylococcus aureus (BCID) NOT DETECTED NOT DETECTED   Staphylococcus epidermidis NOT DETECTED NOT DETECTED   Staphylococcus lugdunensis NOT DETECTED NOT DETECTED   Streptococcus species DETECTED (A) NOT DETECTED    Comment: Not  Enterococcus species, Streptococcus agalactiae, Streptococcus pyogenes, or Streptococcus pneumoniae. CRITICAL RESULT CALLED TO, READ BACK BY AND VERIFIED WITH: PHARMD E.SINCLAIR AT 0944 ON 06/15/2023 BY T.SAAD.    Streptococcus agalactiae NOT DETECTED NOT DETECTED   Streptococcus pneumoniae NOT DETECTED NOT DETECTED   Streptococcus pyogenes NOT DETECTED NOT DETECTED   A.calcoaceticus-baumannii NOT DETECTED NOT DETECTED   Bacteroides fragilis NOT DETECTED NOT DETECTED   Enterobacterales NOT DETECTED NOT DETECTED   Enterobacter cloacae complex NOT DETECTED NOT DETECTED   Escherichia coli NOT DETECTED NOT DETECTED   Klebsiella aerogenes NOT DETECTED NOT DETECTED   Klebsiella oxytoca NOT DETECTED NOT DETECTED   Klebsiella pneumoniae NOT DETECTED NOT DETECTED   Proteus species NOT DETECTED NOT DETECTED   Salmonella species NOT DETECTED NOT DETECTED   Serratia marcescens NOT DETECTED NOT DETECTED   Haemophilus influenzae NOT DETECTED NOT DETECTED   Neisseria meningitidis NOT DETECTED NOT DETECTED   Pseudomonas aeruginosa NOT DETECTED NOT DETECTED   Stenotrophomonas maltophilia NOT DETECTED NOT DETECTED   Candida albicans NOT DETECTED NOT DETECTED   Candida auris NOT DETECTED NOT DETECTED   Candida glabrata NOT DETECTED NOT DETECTED   Candida krusei NOT DETECTED NOT DETECTED   Candida parapsilosis NOT DETECTED NOT DETECTED   Candida tropicalis NOT DETECTED NOT DETECTED   Cryptococcus neoformans/gattii NOT DETECTED NOT DETECTED    Comment: Performed at [facility], [address], [place], [place] [address]  Resp panel by RT-PCR (RSV, Flu A&B, Covid) Anterior Nasal Swab     Status: None   Collection Time: 06/14/23  5:53 PM   Specimen: Anterior Nasal Swab  Result Value Ref Range   SARS Coronavirus 2 by RT PCR NEGATIVE NEGATIVE    Comment: (NOTE) SARS-CoV-2 target nucleic acids are NOT DETECTED.  The SARS-CoV-2 RNA is generally detectable in upper respiratory specimens during the  acute phase of infection. The lowest concentration of SARS-CoV-2 viral copies this assay can detect is 138 copies/mL. A negative result does not preclude SARS-Cov-2 infection and should not be used as the sole basis for treatment or other patient management decisions. A negative result may occur with  improper specimen collection/handling, submission of specimen other than nasopharyngeal swab, presence of viral mutation(s) within the areas targeted by this assay, and inadequate number of viral copies(<138 copies/mL). A negative result must be combined with clinical observations, patient history, and epidemiological information. The expected result is Negative.  Fact Sheet for Patients:  BloggerCourse.com  Fact Sheet for Healthcare Providers:  SeriousBroker.it  This test is no t yet approved or cleared by the [place] FDA and  has been authorized for detection and/or diagnosis of SARS-CoV-2 by FDA under an Emergency Use Authorization (EUA). This EUA will remain  in effect (meaning this test can be used) for the duration of the COVID-19 declaration under Section 564(b)(1) of the Act, 21 U.S.C.section 360bbb-3(b)(1), unless the authorization is terminated  or revoked sooner.       Influenza A by PCR NEGATIVE NEGATIVE   Influenza B by PCR NEGATIVE NEGATIVE    Comment: (NOTE) The Xpert Xpress SARS-CoV-2/FLU/RSV plus assay is intended as an aid in the diagnosis of influenza  from Nasopharyngeal swab specimens and should not be used as a sole basis for treatment. Nasal washings and aspirates are unacceptable for Xpert Xpress SARS-CoV-2/FLU/RSV testing.  Fact Sheet for Patients: BloggerCourse.com  Fact Sheet for Healthcare Providers: SeriousBroker.it  This test is not yet approved or cleared by the [place] FDA and has been authorized for detection and/or diagnosis of  SARS-CoV-2 by FDA under an Emergency Use Authorization (EUA). This EUA will remain in effect (meaning this test can be used) for the duration of the COVID-19 declaration under Section 564(b)(1) of the Act, 21 U.S.C. section 360bbb-3(b)(1), unless the authorization is terminated or revoked.     Resp Syncytial Virus by PCR NEGATIVE NEGATIVE    Comment: (NOTE) Fact Sheet for Patients: BloggerCourse.com  Fact Sheet for Healthcare Providers: SeriousBroker.it  This test is not yet approved or cleared by the [place] FDA and has been authorized for detection and/or diagnosis of SARS-CoV-2 by FDA under an Emergency Use Authorization (EUA). This EUA will remain in effect (meaning this test can be used) for the duration of the COVID-19 declaration under Section 564(b)(1) of the Act, 21 U.S.C. section 360bbb-3(b)(1), unless the authorization is terminated or revoked.  Performed at [facility], [address], [place], [place] [address]   Lactic acid, plasma     Status: None   Collection Time: 06/14/23  7:07 PM  Result Value Ref Range   Lactic Acid, Venous 1.6 0.5 - 1.9 mmol/L    Comment: Performed at [facility], [address], [place], [place] [address]  Urinalysis, w/ Reflex to Culture (Infection Suspected) -Urine, Unspecified Source     Status: Abnormal   Collection Time: 06/14/23  7:43 PM  Result Value Ref Range   Specimen Source URINE, RANDOM     Comment: CORRECTED ON 03/06 AT 1958: PREVIOUSLY REPORTED AS URINE, UNSPE   Color, Urine YELLOW YELLOW   APPearance CLEAR CLEAR   Specific Gravity, Urine 1.044 (H) 1.005 - 1.030   pH 6.0 5.0 - 8.0   Glucose, UA NEGATIVE NEGATIVE mg/dL   Hgb urine dipstick NEGATIVE NEGATIVE   Bilirubin Urine NEGATIVE NEGATIVE   Ketones, ur NEGATIVE NEGATIVE mg/dL   Protein, ur 098 (A) NEGATIVE mg/dL   Nitrite NEGATIVE NEGATIVE   Leukocytes,Ua NEGATIVE NEGATIVE   RBC / HPF 11-20 0 - 5 RBC/hpf    WBC, UA 6-10 0 - 5 WBC/hpf    Comment:        Reflex urine culture not performed if WBC <=10, OR if Squamous epithelial cells >5. If Squamous epithelial cells >5 suggest recollection.    Bacteria, UA RARE (A) NONE SEEN   Squamous Epithelial / HPF 0-5 0 - 5 /HPF   Mucus PRESENT    Hyaline Casts, UA PRESENT     Comment: Performed at [facility], [address], [place], [place] [address]  Osmolality, urine     Status: None   Collection Time: 06/14/23  7:43 PM  Result Value Ref Range   Osmolality, Ur 487 300 - 900 mOsm/kg    Comment: REPEATED TO VERIFY Performed at [facility], [address], [place], [place] [address]   Sodium, urine, random     Status: None   Collection Time: 06/14/23  7:43 PM  Result Value Ref Range   Sodium, Ur 40 mmol/L    Comment: Performed at [facility], [address] [place], [place] [address]  Osmolality     Status: None   Collection Time: 06/15/23  4:28 AM  Result Value Ref Range   Osmolality 289 275 - 295 mOsm/kg    Comment: Performed at [facility], [address], [place], [place] [address]  Comprehensive metabolic panel     Status: Abnormal   Collection Time: 06/15/23  4:28 AM  Result Value Ref Range   Sodium 132 (L) 135 - 145 mmol/L   Potassium 3.8 3.5 - 5.1 mmol/L   Chloride 102 98 - 111 mmol/L   CO2 23 22 - 32 mmol/L   Glucose, Bld 179 (H) 70 - 99 mg/dL    Comment: Glucose reference range applies only to samples taken after fasting for at least 8 hours.   BUN 25 (H) 8 - 23 mg/dL   Creatinine, Ser 6.04 0.61 - 1.24 mg/dL   Calcium 8.1 (L) 8.9 - 10.3 mg/dL   Total Protein 5.3 (L) 6.5 - 8.1 g/dL   Albumin 2.3 (L) 3.5 - 5.0 g/dL   AST 14 (L) 15 - 41 U/L   ALT 20 0 - 44 U/L   Alkaline Phosphatase 69 38 - 126 U/L   Total Bilirubin 0.8 0.0 - 1.2 mg/dL   GFR, Estimated >54 >09 mL/min    Comment: (NOTE) Calculated using the CKD-EPI Creatinine Equation (2021)    Anion gap 7 5 - 15    Comment: Performed at [facility],  [address] [place], [place] [address]  CBC     Status: Abnormal   Collection Time: 06/15/23  4:28 AM  Result Value Ref Range   WBC 12.5 (H) 4.0 - 10.5 K/uL   RBC 3.70 (L) 4.22 - 5.81 MIL/uL   Hemoglobin 9.7 (L) 13.0 - 17.0 g/dL   HCT 47.8 (L) 29.5 - 62.1 %   MCV 83.5 80.0 - 100.0 fL   MCH 26.2 26.0 - 34.0 pg   MCHC 31.4 30.0 - 36.0 g/dL   RDW 30.8 65.7 - 84.6 %   Platelets 272 150 - 400 K/uL   nRBC 0.0 0.0 - 0.2 %    Comment: Performed at [facility], [address] [place], [place] [address]  Magnesium     Status: None   Collection Time: 06/15/23  4:28 AM  Result Value Ref Range   Magnesium 2.0 1.7 - 2.4 mg/dL    Comment: Performed at [facility], [address] [place], [place] [address]  Phosphorus     Status: None   Collection Time: 06/15/23  4:28 AM  Result Value Ref Range   Phosphorus 3.8 2.5 - 4.6 mg/dL    Comment: Performed at [facility], [address], [place], [place] [address]  CBG monitoring, ED     Status: Abnormal   Collection Time: 06/15/23  7:53 AM  Result Value Ref Range   Glucose-Capillary 147 (H) 70 - 99 mg/dL    Comment: Glucose reference range applies only to samples taken after fasting for at least 8 hours.  Lactic acid, plasma     Status: None   Collection Time: 06/15/23  7:56 AM  Result Value Ref Range   Lactic Acid, Venous 0.8 0.5 - 1.9 mmol/L    Comment: Performed at [facility], [address], [place], [place] [address]  CBG monitoring, ED     Status: Abnormal   Collection Time: 06/15/23 11:55 AM  Result Value Ref Range   Glucose-Capillary 151 (H) 70 - 99 mg/dL    Comment: Glucose reference range applies only to samples taken after fasting for at least 8 hours.   CT ABDOMEN  PELVIS W CONTRAST Result Date: 06/14/2023 CLINICAL DATA:  Acute generalized abdominal pain. EXAM: CT ABDOMEN AND PELVIS WITH CONTRAST TECHNIQUE: Multidetector CT imaging of the abdomen and pelvis was performed using the standard protocol following bolus administration of  intravenous contrast. RADIATION DOSE REDUCTION: This exam was performed according to the departmental dose-optimization program which includes automated exposure control, adjustment of the mA and/or kV according to patient size and/or use of iterative reconstruction technique. CONTRAST:  80mL OMNIPAQUE IOHEXOL 300 MG/ML  SOLN COMPARISON:  June 03, 2023.  December 15, 2021. FINDINGS: Lower chest: Small pericardial effusion is noted. Minimal bilateral posterior basilar subsegmental atelectasis. Hepatobiliary: Small gallstone is noted with mild gallbladder distension. No biliary dilatation. Probable hepatic cysts are noted. Pancreas: Unremarkable. No pancreatic ductal dilatation or surrounding inflammatory changes. Spleen: Mild splenomegaly. Adrenals/Urinary Tract: Adrenal glands appear normal. Bilateral renal cysts are noted for which no further follow-up is required. No hydronephrosis or renal obstruction is noted. Urinary bladder is unremarkable. Stomach/Bowel: Stomach is within normal limits. Appendix appears normal. No evidence of bowel wall thickening, distention, or inflammatory changes. Vascular/Lymphatic: Aortic atherosclerosis. No enlarged abdominal or pelvic lymph nodes. Reproductive: Mild prostatic enlargement is again noted. Other: Small bilateral fat containing inguinal hernias are noted. No ascites is noted. Musculoskeletal: There appears to be irregular defect involving the posterior portion of the L4 vertebral body. IMPRESSION: Irregular defect is seen involving posterior portion of L4 vertebral body. Osteomyelitis cannot be excluded. MRI with and without gadolinium is recommended for further evaluation. Small pericardial effusion. Small gallstone with gallbladder distention. Mild splenomegaly. Mild prostatic enlargement. Aortic Atherosclerosis (ICD10-I70.0). Electronically Signed   By: Lupita Raider M.D.   On: 06/14/2023 20:57   DG Chest Portable 1 View Result Date: 06/14/2023 CLINICAL DATA:   Fever. EXAM: PORTABLE CHEST 1 VIEW COMPARISON:  April 26, 2023 FINDINGS: There is stable dual lead AICD positioning. The heart size and mediastinal contours are within normal limits. Both lungs are clear. Multilevel degenerative changes seen throughout the thoracic spine. IMPRESSION: No active disease. Electronically Signed   By: Aram Candela M.D.   On: 06/14/2023 20:26    Pending Labs Unresulted Labs (From admission, onward)     Start     Ordered   06/16/23 0500  CBC  Daily,   R      06/15/23 0729   06/16/23 0500  Comprehensive metabolic panel  Daily,   R      06/15/23 0729   06/16/23 0000  Culture, blood (Routine X 2) w Reflex to ID Panel  BLOOD CULTURE X 2,   R (with TIMED occurrences)      06/15/23 1032   06/14/23 2031  Urine Culture  Once,   URGENT       Question:  Indication  Answer:  Sepsis   06/14/23 2030            Vitals/Pain Today's Vitals   06/15/23 0717 06/15/23 0755 06/15/23 1100 06/15/23 1128  BP:   126/62   Pulse:   (!) 103   Resp:   19   Temp:  97.9 F (36.6 C)    TempSrc:  Oral    SpO2:   94%   Weight:      Height:      PainSc: Asleep   7     Isolation Precautions No active isolations  Medications Medications  HYDROmorphone (DILAUDID) injection 0.5 mg (0.5 mg Intravenous Given 06/15/23 1128)  feeding supplement (GLUCERNA SHAKE) (GLUCERNA SHAKE) liquid 237 mL (237 mLs  Oral Patient Refused/Not Given 06/15/23 1420)  insulin aspart (novoLOG) injection 0-9 Units (2 Units Subcutaneous Given 06/15/23 1219)  acetaminophen (TYLENOL) tablet 650 mg (has no administration in time range)    Or  acetaminophen (TYLENOL) suppository 650 mg (has no administration in time range)  prochlorperazine (COMPAZINE) injection 10 mg (has no administration in time range)  rosuvastatin (CRESTOR) tablet 20 mg (20 mg Oral Given 06/15/23 1003)  alfuzosin (UROXATRAL) 24 hr tablet 10 mg (has no administration in time range)  cefTRIAXone (ROCEPHIN) 2 g in sodium chloride 0.9 % 100  mL IVPB (0 g Intravenous Stopped 06/15/23 1112)  amLODipine (NORVASC) tablet 5 mg (has no administration in time range)  irbesartan (AVAPRO) tablet 75 mg (has no administration in time range)  LORazepam (ATIVAN) injection 1 mg (0 mg Intravenous Hold 06/15/23 1256)  HYDROmorphone (DILAUDID) injection 0.5 mg (0.5 mg Intravenous Given 06/14/23 1747)  sodium chloride 0.9 % bolus 500 mL (0 mLs Intravenous Stopped 06/14/23 1937)  piperacillin-tazobactam (ZOSYN) IVPB 3.375 g (0 g Intravenous Stopped 06/14/23 2144)  iohexol (OMNIPAQUE) 300 MG/ML solution 80 mL (80 mLs Intravenous Contrast Given 06/14/23 1811)  vancomycin (VANCOREADY) IVPB 1750 mg/350 mL (0 mg Intravenous Stopped 06/15/23 0228)  fentaNYL (SUBLIMAZE) injection 50 mcg (50 mcg Intravenous Given 06/15/23 0046)    Mobility non-ambulatory     Focused Assessments Neuro Assessment Handoff:  Swallow screen pass? Yes  Prolonged QT       Neuro Assessment: Within Defined Limits Neuro Checks:      Has TPA been given? No If patient is a Neuro Trauma and patient is going to OR before floor call report to 4N Charge nurse: [phone] or [phone]   R Recommendations: See Admitting Provider Note  Report given to:   Additional Notes:

## 2023-06-16 ENCOUNTER — Inpatient Hospital Stay (HOSPITAL_COMMUNITY)

## 2023-06-16 DIAGNOSIS — M4626 Osteomyelitis of vertebra, lumbar region: Secondary | ICD-10-CM

## 2023-06-16 DIAGNOSIS — I10 Essential (primary) hypertension: Secondary | ICD-10-CM | POA: Diagnosis not present

## 2023-06-16 DIAGNOSIS — R7881 Bacteremia: Secondary | ICD-10-CM

## 2023-06-16 DIAGNOSIS — B9689 Other specified bacterial agents as the cause of diseases classified elsewhere: Secondary | ICD-10-CM

## 2023-06-16 DIAGNOSIS — M25551 Pain in right hip: Secondary | ICD-10-CM | POA: Diagnosis not present

## 2023-06-16 DIAGNOSIS — Z950 Presence of cardiac pacemaker: Secondary | ICD-10-CM

## 2023-06-16 DIAGNOSIS — K0261 Dental caries on smooth surface limited to enamel: Secondary | ICD-10-CM | POA: Diagnosis not present

## 2023-06-16 DIAGNOSIS — E876 Hypokalemia: Secondary | ICD-10-CM | POA: Diagnosis not present

## 2023-06-16 DIAGNOSIS — E871 Hypo-osmolality and hyponatremia: Secondary | ICD-10-CM | POA: Diagnosis not present

## 2023-06-16 DIAGNOSIS — M4620 Osteomyelitis of vertebra, site unspecified: Secondary | ICD-10-CM | POA: Diagnosis not present

## 2023-06-16 DIAGNOSIS — E1165 Type 2 diabetes mellitus with hyperglycemia: Secondary | ICD-10-CM | POA: Diagnosis not present

## 2023-06-16 LAB — COMPREHENSIVE METABOLIC PANEL
ALT: 25 U/L (ref 0–44)
AST: 25 U/L (ref 15–41)
Albumin: 2.2 g/dL — ABNORMAL LOW (ref 3.5–5.0)
Alkaline Phosphatase: 74 U/L (ref 38–126)
Anion gap: 10 (ref 5–15)
BUN: 22 mg/dL (ref 8–23)
CO2: 21 mmol/L — ABNORMAL LOW (ref 22–32)
Calcium: 8.2 mg/dL — ABNORMAL LOW (ref 8.9–10.3)
Chloride: 102 mmol/L (ref 98–111)
Creatinine, Ser: 1.01 mg/dL (ref 0.61–1.24)
GFR, Estimated: >60 mL/min (ref >60)
Glucose, Bld: 158 mg/dL — ABNORMAL HIGH (ref 70–99)
Potassium: 3.2 mmol/L — ABNORMAL LOW (ref 3.5–5.1)
Sodium: 133 mmol/L — ABNORMAL LOW (ref 135–145)
Total Bilirubin: 0.7 mg/dL (ref 0.0–1.2)
Total Protein: 5.3 g/dL — ABNORMAL LOW (ref 6.5–8.1)

## 2023-06-16 LAB — CBC
HCT: 30.1 % — ABNORMAL LOW (ref 39.0–52.0)
Hemoglobin: 9.8 g/dL — ABNORMAL LOW (ref 13.0–17.0)
MCH: 26.8 pg (ref 26.0–34.0)
MCHC: 32.6 g/dL (ref 30.0–36.0)
MCV: 82.5 fL (ref 80.0–100.0)
Platelets: 274 10*3/uL (ref 150–400)
RBC: 3.65 MIL/uL — ABNORMAL LOW (ref 4.22–5.81)
RDW: 14.7 % (ref 11.5–15.5)
WBC: 10.6 10*3/uL — ABNORMAL HIGH (ref 4.0–10.5)
nRBC: 0 % (ref 0.0–0.2)

## 2023-06-16 LAB — URINE CULTURE: Culture: <10000 — AB

## 2023-06-16 LAB — GLUCOSE, CAPILLARY
Glucose-Capillary: 153 mg/dL — ABNORMAL HIGH (ref 70–99)
Glucose-Capillary: 179 mg/dL — ABNORMAL HIGH (ref 70–99)
Glucose-Capillary: 162 mg/dL — ABNORMAL HIGH (ref 70–99)
Glucose-Capillary: 198 mg/dL — ABNORMAL HIGH (ref 70–99)

## 2023-06-16 MED ORDER — SENNOSIDES-DOCUSATE SODIUM 8.6-50 MG PO TABS
2.0000 | ORAL_TABLET | Freq: Two times a day (BID) | ORAL | Status: DC
  Administered 2023-06-16 – 2023-06-30 (×17): 2 via ORAL
  Filled 2023-06-16 (×27): qty 2

## 2023-06-16 MED ORDER — POLYETHYLENE GLYCOL 3350 17 G PO PACK
17.0000 g | PACK | Freq: Every day | ORAL | Status: DC
  Administered 2023-06-16 – 2023-06-26 (×7): 17 g via ORAL
  Filled 2023-06-16 (×15): qty 1

## 2023-06-16 MED ORDER — LIDOCAINE 5 % EX PTCH
2.0000 | MEDICATED_PATCH | CUTANEOUS | Status: DC
  Administered 2023-06-16 – 2023-06-29 (×8): 2 via TRANSDERMAL
  Filled 2023-06-16 (×14): qty 2

## 2023-06-16 MED ORDER — HYDROMORPHONE HCL 1 MG/ML IJ SOLN
0.5000 mg | INTRAMUSCULAR | Status: DC | PRN
  Administered 2023-06-16 – 2023-06-27 (×20): 0.5 mg via INTRAVENOUS
  Filled 2023-06-16 (×4): qty 0.5
  Filled 2023-06-16: qty 1
  Filled 2023-06-16: qty 0.5
  Filled 2023-06-16: qty 1
  Filled 2023-06-16 (×13): qty 0.5
  Filled 2023-06-16: qty 1

## 2023-06-16 MED ORDER — POTASSIUM CHLORIDE CRYS ER 20 MEQ PO TBCR
40.0000 meq | EXTENDED_RELEASE_TABLET | Freq: Once | ORAL | Status: AC
  Administered 2023-06-16: 40 meq via ORAL
  Filled 2023-06-16: qty 2

## 2023-06-16 MED ORDER — LIDOCAINE 5 % EX PTCH: 1.0000 | MEDICATED_PATCH | CUTANEOUS | Status: DC

## 2023-06-16 MED ORDER — OXYCODONE HCL 5 MG PO TABS
5.0000 mg | ORAL_TABLET | ORAL | Status: DC | PRN
  Administered 2023-06-16 – 2023-06-30 (×31): 5 mg via ORAL
  Filled 2023-06-16 (×33): qty 1

## 2023-06-16 NOTE — Progress Notes (Addendum)
 [facility] PROGRESS NOTE   Willett Lefeber ZOX:[id number] DOB: July 17, 1945 DOA: 06/14/2023  PCP: [name], MD  Brief History: 78 y.o. male with medical history significant of  type 2 diabetes mellitus, essential hypertension, mixed hyperlipidemia, low back pain (on oxycodone at home.), trifascicular block s/p PPM,  OSA on CPAP, GERD who presented to the ED emergency department due to worsening low back pain.  Imaging studies raised concern for discitis.  He was hospitalized for further management.    Consultants: Phone discussion with neurosurgery.  Infectious disease.  Procedures: None yet    Subjective/Interval History: Patient mentions that his back pain is reasonably well-controlled on pain medications.  Able to move his legs.  No difficulty urinating.  His wife is at the bedside.  Multiple questions were asked which were all answered.    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 Case was discussed with neurosurgery who recommended MRI.  Patient has a pacemaker so he was transferred to [facility]. Informed by MRI tech that due to the type of pacemaker that he has along with the position of the leads he is unfortunately unable to get MRI safely. This was discussed again with neurosurgery this morning.  They have reviewed the films.  They do not recommend any additional imaging studies.  They recommend IR aspiration only if this is felt to be necessary by infectious disease. In the meantime his blood cultures positive for strep species. Patient is noted to be on ceftriaxone. Await infectious disease input this morning. WBC noted to be 10.6 today.  He is afebrile.  No focal neurological deficits noted. Lactic acid levels improved.  Hyponatremia Stable to improved.  Hypokalemia Will be supplemented.   Magnesium was 2.0 yesterday.  Normocytic anemia No evidence of overt bleeding.  QT prolongation Correct electrolytes.  Repeat EKG showed improvement in QT prolongation.  Diabetes mellitus type 2 with hyperglycemia Metformin on hold.  Continue SSI.  HbA1c is 6.2.  Essential hypertension Noted to be on amlodipine and irbesartan.  Blood pressure is reasonably well-controlled.  History of BPH Continue alfuzosin.  History of hyperlipidemia Statin.  Recently diagnosed cholelithiasis Outpatient follow-up with general surgery.  DVT Prophylaxis: SCDs for now Code Status: Full code Family Communication: Discussed with patient and his wife Disposition Plan: To be determined  Status is: Inpatient Remains inpatient appropriate because: Need for IV antibiotics      Medications: Scheduled:  alfuzosin  10 mg Oral QHS   amLODipine  5 mg Oral Daily   feeding supplement (GLUCERNA SHAKE)  237 mL Oral TID BM   insulin aspart  0-9 Units Subcutaneous TID WC   irbesartan  75 mg Oral Daily   LORazepam  1 mg Intravenous Once   polyethylene glycol  17 g Oral Daily   rosuvastatin  20 mg Oral Daily   senna-docusate  2 tablet Oral BID   Continuous:  cefTRIAXone (ROCEPHIN)  IV 2 g (06/16/23 0909)   WJX:BJYNWGNFAOZHY **OR** acetaminophen, HYDROmorphone (DILAUDID) injection, oxyCODONE, prochlorperazine  Antibiotics: Anti-infectives (From admission, onward)    Start     Dose/Rate Route Frequency Ordered Stop   06/16/23 0000  nafcillin injection 2 g  Status:  Discontinued       Note to Pharmacy: Hold for now per ID Dr. Renold Don Please call before initiating antibiotics again   2 g Intravenous Every 4 hours 06/15/23 0756 06/15/23  4098   06/15/23 1200  nafcillin injection 2 g  Status:  Discontinued        2 g Intravenous Every 4 hours 06/15/23 0346 06/15/23 0756   06/15/23 1000  cefTRIAXone (ROCEPHIN) 2 g in sodium chloride 0.9 % 100 mL IVPB        2 g 200 mL/hr over 30 Minutes Intravenous Every  24 hours 06/15/23 0947     06/14/23 2345  vancomycin (VANCOREADY) IVPB 1750 mg/350 mL        1,750 mg 175 mL/hr over 120 Minutes Intravenous  Once 06/14/23 2336 06/15/23 0228   06/14/23 1800  piperacillin-tazobactam (ZOSYN) IVPB 3.375 g        3.375 g 100 mL/hr over 30 Minutes Intravenous  Once 06/14/23 1749 06/14/23 2144       Objective:  Vital Signs  Vitals:   06/15/23 1658 06/15/23 2024 06/16/23 0006 06/16/23 0507  BP: 103/74 (!) 151/64 (!) 118/50 139/74  Pulse: 60 81 73 94  Resp: 18 19 16 18   Temp: 98.3 F (36.8 C) 97.6 F (36.4 C) 98.6 F (37 C) 98.7 F (37.1 C)  TempSrc: Oral Oral Oral Oral  SpO2: 94%  92% 94%  Weight:      Height:        Intake/Output Summary (Last 24 hours) at 06/16/2023 1024 Last data filed at 06/16/2023 0510 Gross per 24 hour  Intake --  Output 500 ml  Net -500 ml   Filed Weights   06/14/23 1645  Weight: 80.4 kg    General appearance: Awake alert.  In no distress Resp: Clear to auscultation bilaterally.  Normal effort Cardio: S1-S2 is normal regular.  No S3-S4.  No rubs murmurs or bruit GI: Abdomen is soft.  Nontender nondistended.  Bowel sounds are present normal.  No masses organomegaly Extremities: No edema.  Physical deconditioning noted.  He is able to lift both his legs off the bed with some difficulty. Neurologic: Alert and oriented x3.  No focal neurological deficits.    Lab Results:  Data Reviewed: I have personally reviewed following labs and reports of the imaging studies  CBC: Recent Labs  Lab 06/14/23 1725 06/15/23 0428 06/16/23 0554  WBC 15.2* 12.5* 10.6*  NEUTROABS 14.0*  --   --   HGB 11.1* 9.7* 9.8*  HCT 33.9* 30.9* 30.1*  MCV 81.5 83.5 82.5  PLT 334 272 274    Basic Metabolic Panel: Recent Labs  Lab 06/14/23 1725 06/15/23 0428 06/16/23 0554  NA 127* 132* 133*  K 4.0 3.8 3.2*  CL 97* 102 102  CO2 17* 23 21*  GLUCOSE 287* 179* 158*  BUN 22 25* 22  CREATININE 1.44* 1.11 1.01  CALCIUM 8.4* 8.1* 8.2*   MG  --  2.0  --   PHOS  --  3.8  --     GFR: Estimated Creatinine Clearance: 66.2 mL/min (by C-G formula based on SCr of 1.01 mg/dL).  Liver Function Tests: Recent Labs  Lab 06/14/23 1725 06/15/23 0428 06/16/23 0554  AST 20 14* 25  ALT 21 20 25   ALKPHOS 84 69 74  BILITOT 1.2 0.8 0.7  PROT 6.1* 5.3* 5.3*  ALBUMIN 2.5* 2.3* 2.2*    Recent Labs  Lab 06/14/23 1725  LIPASE 20    CBG: Recent Labs  Lab 06/15/23 0753 06/15/23 1155 06/15/23 1734 06/15/23 2143 06/16/23 0834  GLUCAP 147* 151* 137* 211* 153*     Recent Results (from the past 240 hours)  Culture, blood (routine x  2)     Status: None (Preliminary result)   Collection Time: 06/14/23  5:25 PM   Specimen: Left Antecubital; Blood  Result Value Ref Range Status   Specimen Description   Final    LEFT ANTECUBITAL BOTTLES DRAWN AEROBIC AND ANAEROBIC Performed at [facility], [address], [place], [place] [address]    Special Requests   Final    Blood Culture results may not be optimal due to an inadequate volume of blood received in culture bottles Performed at [facility], [address] [place], [place] [address]    Culture  Setup Time   Final    GRAM POSITIVE COCCI IN BOTH AEROBIC AND ANAEROBIC BOTTLES Gram Stain Report Called to,Read Back By and Verified With: NICHOLS @ 0649 ON [id number] BY HENDERSON L CRITICAL RESULT CALLED TO, READ BACK BY AND VERIFIED WITH: PHARMD E.SINCLAIR AT 0944 ON 06/15/2023 BY T.SAAD. Performed at [facility], [address], [place], [place] [address]    Culture GRAM POSITIVE COCCI  Final   Report Status PENDING  Incomplete  Culture, blood (routine x 2)     Status: None (Preliminary result)   Collection Time: 06/14/23  5:25 PM   Specimen: BLOOD LEFT FOREARM  Result Value Ref Range Status   Specimen Description   Final    BLOOD LEFT FOREARM BOTTLES DRAWN AEROBIC AND ANAEROBIC Performed at [facility], [address] [place], [place] [address]    Special Requests    Final    Blood Culture adequate volume Performed at [facility], [address] [place], [place] [address]    Culture  Setup Time   Final    GRAM POSITIVE COCCI IN BOTH AEROBIC AND ANAEROBIC BOTTLES Gram Stain Report Called to,Read Back By and Verified With: NICHOLS @ 0649 ON [id number] BY HENDERSON L CRITICAL VALUE NOTED.  VALUE IS CONSISTENT WITH PREVIOUSLY REPORTED AND CALLED VALUE. Performed at [facility], [address], [place], [place] [address]    Culture GRAM POSITIVE COCCI  Final   Report Status PENDING  Incomplete  Blood Culture ID Panel (Reflexed)     Status: Abnormal   Collection Time: 06/14/23  5:25 PM  Result Value Ref Range Status   Enterococcus faecalis NOT DETECTED NOT DETECTED Final   Enterococcus Faecium NOT DETECTED NOT DETECTED Final   Listeria monocytogenes NOT DETECTED NOT DETECTED Final   Staphylococcus species NOT DETECTED NOT DETECTED Final   Staphylococcus aureus (BCID) NOT DETECTED NOT DETECTED Final   Staphylococcus epidermidis NOT DETECTED NOT DETECTED Final   Staphylococcus lugdunensis NOT DETECTED NOT DETECTED Final   Streptococcus species DETECTED (A) NOT DETECTED Final    Comment: Not Enterococcus species, Streptococcus agalactiae, Streptococcus pyogenes, or Streptococcus pneumoniae. CRITICAL RESULT CALLED TO, READ BACK BY AND VERIFIED WITH: PHARMD E.SINCLAIR AT 0944 ON 06/15/2023 BY T.SAAD.    Streptococcus agalactiae NOT DETECTED NOT DETECTED Final   Streptococcus pneumoniae NOT DETECTED NOT DETECTED Final   Streptococcus pyogenes NOT DETECTED NOT DETECTED Final   A.calcoaceticus-baumannii NOT DETECTED NOT DETECTED Final   Bacteroides fragilis NOT DETECTED NOT DETECTED Final   Enterobacterales NOT DETECTED NOT DETECTED Final   Enterobacter cloacae complex NOT DETECTED NOT DETECTED Final   Escherichia coli NOT DETECTED NOT DETECTED Final   Klebsiella aerogenes NOT DETECTED NOT DETECTED Final   Klebsiella oxytoca NOT DETECTED NOT DETECTED  Final   Klebsiella pneumoniae NOT DETECTED NOT DETECTED Final   Proteus species NOT DETECTED NOT DETECTED Final   Salmonella species  NOT DETECTED NOT DETECTED Final   Serratia marcescens NOT DETECTED NOT DETECTED Final   Haemophilus influenzae NOT DETECTED NOT DETECTED Final   Neisseria meningitidis NOT DETECTED NOT DETECTED Final   Pseudomonas aeruginosa NOT DETECTED NOT DETECTED Final   Stenotrophomonas maltophilia NOT DETECTED NOT DETECTED Final   Candida albicans NOT DETECTED NOT DETECTED Final   Candida auris NOT DETECTED NOT DETECTED Final   Candida glabrata NOT DETECTED NOT DETECTED Final   Candida krusei NOT DETECTED NOT DETECTED Final   Candida parapsilosis NOT DETECTED NOT DETECTED Final   Candida tropicalis NOT DETECTED NOT DETECTED Final   Cryptococcus neoformans/gattii NOT DETECTED NOT DETECTED Final    Comment: Performed at [facility], [address], [place], [place] [address]  Resp panel by RT-PCR (RSV, Flu A&B, Covid) Anterior Nasal Swab     Status: None   Collection Time: 06/14/23  5:53 PM   Specimen: Anterior Nasal Swab  Result Value Ref Range Status   SARS Coronavirus 2 by RT PCR NEGATIVE NEGATIVE Final    Comment: (NOTE) SARS-CoV-2 target nucleic acids are NOT DETECTED.  The SARS-CoV-2 RNA is generally detectable in upper respiratory specimens during the acute phase of infection. The lowest concentration of SARS-CoV-2 viral copies this assay can detect is 138 copies/mL. A negative result does not preclude SARS-Cov-2 infection and should not be used as the sole basis for treatment or other patient management decisions. A negative result may occur with  improper specimen collection/handling, submission of specimen other than nasopharyngeal swab, presence of viral mutation(s) within the areas targeted by this assay, and inadequate number of viral copies(<138 copies/mL). A negative result must be combined with clinical observations, patient history, and  epidemiological information. The expected result is Negative.  Fact Sheet for Patients:  BloggerCourse.com  Fact Sheet for Healthcare Providers:  SeriousBroker.it  This test is no t yet approved or cleared by the [place] FDA and  has been authorized for detection and/or diagnosis of SARS-CoV-2 by FDA under an Emergency Use Authorization (EUA). This EUA will remain  in effect (meaning this test can be used) for the duration of the COVID-19 declaration under Section 564(b)(1) of the Act, 21 U.S.C.section 360bbb-3(b)(1), unless the authorization is terminated  or revoked sooner.       Influenza A by PCR NEGATIVE NEGATIVE Final   Influenza B by PCR NEGATIVE NEGATIVE Final    Comment: (NOTE) The Xpert Xpress SARS-CoV-2/FLU/RSV plus assay is intended as an aid in the diagnosis of influenza from Nasopharyngeal swab specimens and should not be used as a sole basis for treatment. Nasal washings and aspirates are unacceptable for Xpert Xpress SARS-CoV-2/FLU/RSV testing.  Fact Sheet for Patients: BloggerCourse.com  Fact Sheet for Healthcare Providers: SeriousBroker.it  This test is not yet approved or cleared by the [place] FDA and has been authorized for detection and/or diagnosis of SARS-CoV-2 by FDA under an Emergency Use Authorization (EUA). This EUA will remain in effect (meaning this test can be used) for the duration of the COVID-19 declaration under Section 564(b)(1) of the Act, 21 U.S.C. section 360bbb-3(b)(1), unless the authorization is terminated or revoked.     Resp Syncytial Virus by PCR NEGATIVE NEGATIVE Final    Comment: (NOTE) Fact Sheet for Patients: BloggerCourse.com  Fact Sheet for Healthcare Providers: SeriousBroker.it  This test is not yet approved or cleared by the [place] FDA and has been  authorized for detection and/or diagnosis of SARS-CoV-2 by FDA under an Emergency Use Authorization (EUA).  This EUA will remain in effect (meaning this test can be used) for the duration of the COVID-19 declaration under Section 564(b)(1) of the Act, 21 U.S.C. section 360bbb-3(b)(1), unless the authorization is terminated or revoked.  Performed at [facility], [address] [place], [place] [address]       Radiology Studies: CT ABDOMEN PELVIS W CONTRAST Result Date: 06/14/2023 CLINICAL DATA:  Acute generalized abdominal pain. EXAM: CT ABDOMEN AND PELVIS WITH CONTRAST TECHNIQUE: Multidetector CT imaging of the abdomen and pelvis was performed using the standard protocol following bolus administration of intravenous contrast. RADIATION DOSE REDUCTION: This exam was performed according to the departmental dose-optimization program which includes automated exposure control, adjustment of the mA and/or kV according to patient size and/or use of iterative reconstruction technique. CONTRAST:  80mL OMNIPAQUE IOHEXOL 300 MG/ML  SOLN COMPARISON:  June 03, 2023.  December 15, 2021. FINDINGS: Lower chest: Small pericardial effusion is noted. Minimal bilateral posterior basilar subsegmental atelectasis. Hepatobiliary: Small gallstone is noted with mild gallbladder distension. No biliary dilatation. Probable hepatic cysts are noted. Pancreas: Unremarkable. No pancreatic ductal dilatation or surrounding inflammatory changes. Spleen: Mild splenomegaly. Adrenals/Urinary Tract: Adrenal glands appear normal. Bilateral renal cysts are noted for which no further follow-up is required. No hydronephrosis or renal obstruction is noted. Urinary bladder is unremarkable. Stomach/Bowel: Stomach is within normal limits. Appendix appears normal. No evidence of bowel wall thickening, distention, or inflammatory changes. Vascular/Lymphatic: Aortic atherosclerosis. No enlarged abdominal or pelvic lymph nodes. Reproductive: Mild  prostatic enlargement is again noted. Other: Small bilateral fat containing inguinal hernias are noted. No ascites is noted. Musculoskeletal: There appears to be irregular defect involving the posterior portion of the L4 vertebral body. IMPRESSION: Irregular defect is seen involving posterior portion of L4 vertebral body. Osteomyelitis cannot be excluded. MRI with and without gadolinium is recommended for further evaluation. Small pericardial effusion. Small gallstone with gallbladder distention. Mild splenomegaly. Mild prostatic enlargement. Aortic Atherosclerosis (ICD10-I70.0). Electronically Signed   By: Lupita Raider M.D.   On: 06/14/2023 20:57   DG Chest Portable 1 View Result Date: 06/14/2023 CLINICAL DATA:  Fever. EXAM: PORTABLE CHEST 1 VIEW COMPARISON:  April 26, 2023 FINDINGS: There is stable dual lead AICD positioning. The heart size and mediastinal contours are within normal limits. Both lungs are clear. Multilevel degenerative changes seen throughout the thoracic spine. IMPRESSION: No active disease. Electronically Signed   By: Aram Candela M.D.   On: 06/14/2023 20:26       LOS: 1 day   Osvaldo Shipper  [facility] Pager on [email or web address]  06/16/2023, 10:24 AM

## 2023-06-16 NOTE — Consult Note (Addendum)
 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  He had WBC 15K.  Blood cultures grew 2/2 sets strep mitis/oralis.  Transition from Vanco pip-tazo to ceftriaxone. - CT abdomen pelvis shows concern for osteomyelitis at L4.  Given he has pacemaker not compatible MRI.  Forego MRI at this time.  IR aspiration likely not necessary given is probably the same organism as in the blood. -Pt states back pain has been ongoing for months.  Recommendations:  -Repeat blood cultures - TTE - Continue ceftriaxone - Will need TEE. PPM site without signs of infection. Followed by EP outpatient, please engage EP.  -Orthopantogram. He notes that he had his teeth cleaned/dental procedure? About 3 months ago. He has been a bit more lethargic x 2 months -Xray right hip-> reports hip pain  Evaluation of this patient requires complex antimicrobial therapy evaluation and counseling + isolation needs for disease transmission risk assessment and mitigation   Microbiology:   Antibiotics: Pip-tazo 3/6 vanc 3/6 Ceftriaxone 3/7-  Cultures: Blood 3/6 s2/2 strep mitis/oralis Urine 3/6 insignificant growth Other   HPI: Grant Bowers is a 78 y.o. male  past medical history of type 2 diabetes, essential hypertension, mixed hyperlipidemia, back pain on home oxycodone, trans fascicular block status post PPM, OSA on CPAP, GERD presented to the ED with worsening back pain closed including [place] where he was asked to emergency department for further evaluation.  On arrival to the ED his vitals are stable.  WBC 15K.  Blood cultures have returned positive for strep mitis/oralis.  CT abdomen pelvis with/suggestive of possibly vertebral osteomyelitis ID was engaged patient transitioned to ceftriaxone.  Transfer to [facility] to [facility] for higher level of care   Review of Systems: Review of Systems  All other systems reviewed and are negative.   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   [facility];   followed by EP cardiology--- dr Graciela Husbands   Diabetes mellitus without complication (HCC)    Dyspnea due to COVID-19    with exertion   ED (erectile dysfunction)    GERD (gastroesophageal reflux disease)    History of 2019 novel coronavirus disease (COVID-19) 05/07/2020   per pt mild symptoms all resolved with exception DOE   History of cardiac murmur as a child    History of kidney stones    Hyperlipidemia    Hypertension    followed by pcp and cardiology   Hypogonadism male    Mild atherosclerosis of both carotid arteries 09/23/2015   duplex doppler in epic bilateral ICA 1-39%  OSA on CPAP    per pt uses nightly   Post-COVID chronic dyspnea    had covid 05-07-2020;  per pt never had issue prior to covid ;  PFT 10-19-2020 normal per dr c. young   Right ureteral calculus 02/11/2010   Skin cancer    Calf   Trifascicular block    alternating with BBB per dr Graciela Husbands note;  s/p PPM 07-09-2020;   echo 07-29-2020 mild LVH, G1DD, ef 65-70%, moderate LAE, mild MR without stenosis    Social History   Tobacco Use    Smoking status: Never   Smokeless tobacco: Never  Vaping Use   Vaping status: Never Used  Substance Use Topics   Alcohol use: Not Currently    Comment: very rare    Drug use: Never    Family History  Problem Relation Age of Onset   Pancreatic cancer Mother    Diabetes Father    Diabetes Brother    Colon polyps Brother    Colon cancer Neg Hx    Esophageal cancer Neg Hx    Rectal cancer Neg Hx    Stomach cancer Neg Hx    Scheduled Meds:  alfuzosin  10 mg Oral QHS   amLODipine  5 mg Oral Daily   feeding supplement (GLUCERNA SHAKE)  237 mL Oral TID BM   insulin aspart  0-9 Units Subcutaneous TID WC   irbesartan  75 mg Oral Daily   LORazepam  1 mg Intravenous Once   polyethylene glycol  17 g Oral Daily   rosuvastatin  20 mg Oral Daily   senna-docusate  2 tablet Oral BID   Continuous Infusions:  cefTRIAXone (ROCEPHIN)  IV 2 g (06/16/23 0909)   PRN Meds:.acetaminophen **OR** acetaminophen, HYDROmorphone (DILAUDID) injection, oxyCODONE, prochlorperazine No Known Allergies  OBJECTIVE: Blood pressure 139/74, pulse 94, temperature 98.7 F (37.1 C), temperature source Oral, resp. rate 18, height 6' (1.829 m), weight 80.4 kg, SpO2 94%.  Physical Exam Constitutional:      General: He is not in acute distress.    Appearance: He is normal weight. He is not toxic-appearing.  HENT:     Head: Normocephalic and atraumatic.     Right Ear: External ear normal.     Left Ear: External ear normal.     Nose: No congestion or rhinorrhea.     Mouth/Throat:     Mouth: Mucous membranes are moist.     Pharynx: Oropharynx is clear.  Eyes:     Extraocular Movements: Extraocular movements intact.     Conjunctiva/sclera: Conjunctivae normal.     Pupils: Pupils are equal, round, and reactive to light.  Cardiovascular:     Rate and Rhythm: Normal rate and regular rhythm.     Heart sounds: No murmur heard.    No friction rub. No gallop.  Pulmonary:     Effort: Pulmonary effort is normal.      Breath sounds: Normal breath sounds.  Abdominal:     General: Abdomen is flat. Bowel sounds are normal.     Palpations: Abdomen is soft.  Musculoskeletal:        General: No swelling. Normal range of motion.     Cervical back: Normal range of motion and neck supple.  Skin:    General: Skin is warm and dry.  Neurological:     General: No focal deficit present.     Mental Status: He is oriented to person, place, and time.  Psychiatric:  Mood and Affect: Mood normal.   Left ches tport  Lab Results Lab Results  Component Value Date   WBC 10.6 (H) 06/16/2023   HGB 9.8 (L) 06/16/2023   HCT 30.1 (L) 06/16/2023   MCV 82.5 06/16/2023   PLT 274 06/16/2023    Lab Results  Component Value Date   CREATININE 1.01 06/16/2023   BUN 22 06/16/2023   NA 133 (L) 06/16/2023   K 3.2 (L) 06/16/2023   CL 102 06/16/2023   CO2 21 (L) 06/16/2023    Lab Results  Component Value Date   ALT 25 06/16/2023   AST 25 06/16/2023   ALKPHOS 74 06/16/2023   BILITOT 0.7 06/16/2023       Danelle Earthly, MD [facility] 06/16/2023, 1:30 PM

## 2023-06-16 NOTE — Plan of Care (Signed)
  Problem: Clinical Measurements: Goal: Ability to maintain clinical measurements within normal limits will improve Outcome: Progressing   Problem: Activity: Goal: Risk for activity intolerance will decrease Outcome: Progressing   Problem: Coping: Goal: Level of anxiety will decrease Outcome: Progressing   Problem: Pain Managment: Goal: General experience of comfort will improve and/or be controlled Outcome: Progressing   Problem: Safety: Goal: Ability to remain free from injury will improve Outcome: Progressing

## 2023-06-16 NOTE — Plan of Care (Signed)
   Problem: Education: Goal: Ability to describe self-care measures that may prevent or decrease complications (Diabetes Survival Skills Education) will improve Outcome: Progressing Goal: Individualized Educational Video(s) Outcome: Progressing

## 2023-06-17 ENCOUNTER — Inpatient Hospital Stay (HOSPITAL_COMMUNITY)

## 2023-06-17 ENCOUNTER — Other Ambulatory Visit (HOSPITAL_COMMUNITY)

## 2023-06-17 DIAGNOSIS — I38 Endocarditis, valve unspecified: Secondary | ICD-10-CM

## 2023-06-17 DIAGNOSIS — M4620 Osteomyelitis of vertebra, site unspecified: Secondary | ICD-10-CM | POA: Diagnosis not present

## 2023-06-17 LAB — CULTURE, BLOOD (ROUTINE X 2): Special Requests: ADEQUATE

## 2023-06-17 LAB — GLUCOSE, CAPILLARY
Glucose-Capillary: 165 mg/dL — ABNORMAL HIGH (ref 70–99)
Glucose-Capillary: 188 mg/dL — ABNORMAL HIGH (ref 70–99)
Glucose-Capillary: 142 mg/dL — ABNORMAL HIGH (ref 70–99)
Glucose-Capillary: 217 mg/dL — ABNORMAL HIGH (ref 70–99)

## 2023-06-17 LAB — CBC WITH DIFFERENTIAL/PLATELET
Abs Immature Granulocytes: 0.04 10*3/uL (ref 0.00–0.07)
Basophils Absolute: 0 10*3/uL (ref 0.0–0.1)
Basophils Relative: 0 %
Eosinophils Absolute: 0.1 10*3/uL (ref 0.0–0.5)
Eosinophils Relative: 1 %
HCT: 28.3 % — ABNORMAL LOW (ref 39.0–52.0)
Hemoglobin: 9 g/dL — ABNORMAL LOW (ref 13.0–17.0)
Immature Granulocytes: 0 %
Lymphocytes Relative: 10 %
Lymphs Abs: 0.9 10*3/uL (ref 0.7–4.0)
MCH: 26.4 pg (ref 26.0–34.0)
MCHC: 31.8 g/dL (ref 30.0–36.0)
MCV: 83 fL (ref 80.0–100.0)
Monocytes Absolute: 0.7 10*3/uL (ref 0.1–1.0)
Monocytes Relative: 8 %
Neutro Abs: 7.6 10*3/uL (ref 1.7–7.7)
Neutrophils Relative %: 81 %
Platelets: 275 10*3/uL (ref 150–400)
RBC: 3.41 MIL/uL — ABNORMAL LOW (ref 4.22–5.81)
RDW: 14.7 % (ref 11.5–15.5)
WBC: 9.3 10*3/uL (ref 4.0–10.5)
nRBC: 0 % (ref 0.0–0.2)

## 2023-06-17 LAB — BRAIN NATRIURETIC PEPTIDE: B Natriuretic Peptide: 200.6 pg/mL — ABNORMAL HIGH (ref 0.0–100.0)

## 2023-06-17 LAB — COMPREHENSIVE METABOLIC PANEL
ALT: 24 U/L (ref 0–44)
AST: 19 U/L (ref 15–41)
Albumin: 2.1 g/dL — ABNORMAL LOW (ref 3.5–5.0)
Alkaline Phosphatase: 70 U/L (ref 38–126)
Anion gap: 10 (ref 5–15)
BUN: 17 mg/dL (ref 8–23)
CO2: 22 mmol/L (ref 22–32)
Calcium: 8.2 mg/dL — ABNORMAL LOW (ref 8.9–10.3)
Chloride: 103 mmol/L (ref 98–111)
Creatinine, Ser: 0.91 mg/dL (ref 0.61–1.24)
GFR, Estimated: >60 mL/min (ref >60)
Glucose, Bld: 177 mg/dL — ABNORMAL HIGH (ref 70–99)
Potassium: 3.9 mmol/L (ref 3.5–5.1)
Sodium: 135 mmol/L (ref 135–145)
Total Bilirubin: 0.8 mg/dL (ref 0.0–1.2)
Total Protein: 5 g/dL — ABNORMAL LOW (ref 6.5–8.1)

## 2023-06-17 LAB — ECHOCARDIOGRAM LIMITED
Height: 72 in
MV M vel: 5.47 m/s
MV Peak grad: 119.7 mmHg
Weight: 2836 [oz_av]

## 2023-06-17 LAB — PROCALCITONIN: Procalcitonin: 0.71 ng/mL

## 2023-06-17 LAB — C-REACTIVE PROTEIN: CRP: 8.4 mg/dL — ABNORMAL HIGH (ref <1.0)

## 2023-06-17 LAB — MAGNESIUM: Magnesium: 2.1 mg/dL (ref 1.7–2.4)

## 2023-06-17 MED ORDER — SODIUM CHLORIDE 0.9% FLUSH: 3.0000 mL | INTRAVENOUS | Status: DC | PRN

## 2023-06-17 MED ORDER — SODIUM CHLORIDE 0.9% FLUSH
3.0000 mL | Freq: Two times a day (BID) | INTRAVENOUS | Status: DC
  Administered 2023-06-17: 3 mL via INTRAVENOUS

## 2023-06-17 NOTE — Progress Notes (Signed)
 [facility] PROGRESS NOTE   Grant Bowers ZOX:[id number] DOB: Dec 29, 1945 DOA: 06/14/2023  PCP: [name], MD  Brief History: 78 y.o. male with medical history significant of  type 2 diabetes mellitus, essential hypertension, mixed hyperlipidemia, low back pain (on oxycodone at home.), trifascicular block s/p PPM,  OSA on CPAP, GERD who presented to the ED emergency department due to worsening low back pain.  Imaging studies raised concern for discitis.  He was hospitalized for further management.    Consultants: Phone discussion with neurosurgery.  Infectious disease, cardiology/EP  Procedures: EEG requested for 06/18/2023    Subjective/Interval History: Seen in bed having some lower back pain, has any fever chills, no chest pain or shortness of breath, no new weakness.    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 Case was discussed with neurosurgery who recommended MRI.  Patient has a pacemaker so he was transferred to [facility]. Informed by MRI tech that due to the type of pacemaker that he has along with the position of the leads he is unfortunately unable to get MRI safely.,  Per neurosurgery no further imaging required. In by ID on IV antibiotics, source likely to oral dentition for which she will require outpatient dental follow-up, since he has a pacemaker TEE has been requested by cardiology on 06/18/2023.  New supportive care with pain control and antibiotics.  Hyponatremia Stable to improved.  Hypokalemia Will be supplemented.  Magnesium was 2.0 yesterday.  Normocytic anemia No evidence of overt bleeding.  QT prolongation Correct electrolytes.  Repeat EKG showed improvement in QT prolongation.  Essential hypertension Noted to be on amlodipine and irbesartan.  Blood pressure is  reasonably well-controlled.  History of BPH Continue alfuzosin.  History of hyperlipidemia Statin.  Recently diagnosed cholelithiasis Outpatient follow-up with general surgery.  Diabetes mellitus type 2 with hyperglycemia Metformin on hold.  Continue SSI.  HbA1c is 6.2.  Lab Results  Component Value Date   HGBA1C 6.2 04/17/2023   CBG (last 3)  Recent Labs    06/16/23 1613 06/16/23 2106 06/17/23 0731  GLUCAP 162* 198* 165*    DVT Prophylaxis: SCDs for now Code Status: Full code Family Communication: Discussed with patient and his wife Disposition Plan: To be determined  Status is: Inpatient Remains inpatient appropriate because: Need for IV antibiotics      Medications: Scheduled:  alfuzosin  10 mg Oral QHS   amLODipine  5 mg Oral Daily   feeding supplement (GLUCERNA SHAKE)  237 mL Oral TID BM   insulin aspart  0-9 Units Subcutaneous TID WC   irbesartan  75 mg Oral Daily   lidocaine  2 patch Transdermal Q24H   LORazepam  1 mg Intravenous Once   polyethylene glycol  17 g Oral Daily   rosuvastatin  20 mg Oral Daily   senna-docusate  2 tablet Oral BID   Continuous:  cefTRIAXone (ROCEPHIN)  IV 2 g (06/17/23 1118)   WJX:BJYNWGNFAOZHY **OR** acetaminophen, HYDROmorphone (DILAUDID) injection, oxyCODONE, prochlorperazine  Antibiotics: Anti-infectives (From admission, onward)    Start     Dose/Rate Route Frequency Ordered Stop   06/16/23 0000  nafcillin injection 2 g  Status:  Discontinued       Note to Pharmacy: Hold for now per ID Dr. Renold Don Please call before initiating antibiotics again   2 g Intravenous Every 4 hours 06/15/23 0756 06/15/23 0947  06/15/23 1200  nafcillin injection 2 g  Status:  Discontinued        2 g Intravenous Every 4 hours 06/15/23 0346 06/15/23 0756   06/15/23 1000  cefTRIAXone (ROCEPHIN) 2 g in sodium chloride 0.9 % 100 mL IVPB        2 g 200 mL/hr over 30 Minutes Intravenous Every 24 hours 06/15/23 0947     06/14/23 2345  vancomycin  (VANCOREADY) IVPB 1750 mg/350 mL        1,750 mg 175 mL/hr over 120 Minutes Intravenous  Once 06/14/23 2336 06/15/23 0228   06/14/23 1800  piperacillin-tazobactam (ZOSYN) IVPB 3.375 g        3.375 g 100 mL/hr over 30 Minutes Intravenous  Once 06/14/23 1749 06/14/23 2144       Objective:  Vital Signs  Vitals:   06/16/23 1918 06/16/23 2343 06/17/23 0442 06/17/23 0856  BP: 122/64 132/66 139/61 132/64  Pulse: 83 92 (!) 103 94  Resp: 16 18 18 18   Temp: 97.8 F (36.6 C) 98 F (36.7 C) 98.3 F (36.8 C) 98.4 F (36.9 C)  TempSrc: Oral Oral Oral Oral  SpO2: 95% 95% 95% 95%  Weight:      Height:        Intake/Output Summary (Last 24 hours) at 06/17/2023 1156 Last data filed at 06/17/2023 0930 Gross per 24 hour  Intake 360 ml  Output 650 ml  Net -290 ml   Filed Weights   06/14/23 1645  Weight: 80.4 kg    Exam  Awake Alert, No new F.N deficits, bilateral plantars downgoing [place].AT,PERRAL Supple Neck, No JVD,   Symmetrical Chest wall movement, Good air movement bilaterally, CTAB RRR,No Gallops, Rubs or new Murmurs,  +ve B.Sounds, Abd Soft, No tenderness,   No Cyanosis, Clubbing or edema     Lab Results:  Data Reviewed: I have personally reviewed following labs and reports of the imaging studies  CBC: Recent Labs  Lab 06/14/23 1725 06/15/23 0428 06/16/23 0554 06/17/23 0559  WBC 15.2* 12.5* 10.6* 9.3  NEUTROABS 14.0*  --   --  7.6  HGB 11.1* 9.7* 9.8* 9.0*  HCT 33.9* 30.9* 30.1* 28.3*  MCV 81.5 83.5 82.5 83.0  PLT 334 272 274 275    Basic Metabolic Panel: Recent Labs  Lab 06/14/23 1725 06/15/23 0428 06/16/23 0554 06/17/23 0559  NA 127* 132* 133* 135  K 4.0 3.8 3.2* 3.9  CL 97* 102 102 103  CO2 17* 23 21* 22  GLUCOSE 287* 179* 158* 177*  BUN 22 25* 22 17  CREATININE 1.44* 1.11 1.01 0.91  CALCIUM 8.4* 8.1* 8.2* 8.2*  MG  --  2.0  --  2.1  PHOS  --  3.8  --   --     GFR: Estimated Creatinine Clearance: 73.4 mL/min (by C-G formula based on SCr of  0.91 mg/dL).  Liver Function Tests: Recent Labs  Lab 06/14/23 1725 06/15/23 0428 06/16/23 0554 06/17/23 0559  AST 20 14* 25 19  ALT 21 20 25 24   ALKPHOS 84 69 74 70  BILITOT 1.2 0.8 0.7 0.8  PROT 6.1* 5.3* 5.3* 5.0*  ALBUMIN 2.5* 2.3* 2.2* 2.1*    Recent Labs  Lab 06/14/23 1725  LIPASE 20    CBG: Recent Labs  Lab 06/16/23 0834 06/16/23 1302 06/16/23 1613 06/16/23 2106 06/17/23 0731  GLUCAP 153* 179* 162* 198* 165*     Recent Results (from the past 240 hours)  Culture, blood (routine x 2)  Status: Abnormal (Preliminary result)   Collection Time: 06/14/23  5:25 PM   Specimen: Left Antecubital; Blood  Result Value Ref Range Status   Specimen Description   Final    LEFT ANTECUBITAL BOTTLES DRAWN AEROBIC AND ANAEROBIC Performed at [facility], [address], [place], [place] [address]    Special Requests   Final    Blood Culture results may not be optimal due to an inadequate volume of blood received in culture bottles Performed at [facility], [address] [place], [place] [address]    Culture  Setup Time   Final    GRAM POSITIVE COCCI IN BOTH AEROBIC AND ANAEROBIC BOTTLES Gram Stain Report Called to,Read Back By and Verified With: NICHOLS @ 0649 ON [id number] BY HENDERSON L CRITICAL RESULT CALLED TO, READ BACK BY AND VERIFIED WITH: PHARMD E.SINCLAIR AT 0944 ON 06/15/2023 BY T.SAAD. Performed at [facility], [address], [place], [place] [address]    Culture STREPTOCOCCUS MITIS/ORALIS (A)  Final   Report Status PENDING  Incomplete   Organism ID, Bacteria STREPTOCOCCUS MITIS/ORALIS  Final      Susceptibility   Streptococcus mitis/oralis - MIC*    PENICILLIN <=0.06 SENSITIVE Sensitive     CEFTRIAXONE <=0.12 SENSITIVE Sensitive     LEVOFLOXACIN 0.5 SENSITIVE Sensitive     VANCOMYCIN 0.5 SENSITIVE Sensitive     * STREPTOCOCCUS MITIS/ORALIS  Culture, blood (routine x 2)     Status: Abnormal (Preliminary result)   Collection Time: 06/14/23  5:25 PM    Specimen: BLOOD LEFT FOREARM  Result Value Ref Range Status   Specimen Description   Final    BLOOD LEFT FOREARM BOTTLES DRAWN AEROBIC AND ANAEROBIC Performed at [facility], [address], [place], [place] [address]    Special Requests   Final    Blood Culture adequate volume Performed at [facility], [address], [place], [place] [address]    Culture  Setup Time   Final    GRAM POSITIVE COCCI IN BOTH AEROBIC AND ANAEROBIC BOTTLES Gram Stain Report Called to,Read Back By and Verified With: NICHOLS @ 0649 ON [id number] BY HENDERSON L CRITICAL VALUE NOTED.  VALUE IS CONSISTENT WITH PREVIOUSLY REPORTED AND CALLED VALUE. Performed at [facility], [address] [place], [place] [address]    Culture STREPTOCOCCUS MITIS/ORALIS (A)  Final   Report Status PENDING  Incomplete  Blood Culture ID Panel (Reflexed)     Status: Abnormal   Collection Time: 06/14/23  5:25 PM  Result Value Ref Range Status   Enterococcus faecalis NOT DETECTED NOT DETECTED Final   Enterococcus Faecium NOT DETECTED NOT DETECTED Final   Listeria monocytogenes NOT DETECTED NOT DETECTED Final   Staphylococcus species NOT DETECTED NOT DETECTED Final   Staphylococcus aureus (BCID) NOT DETECTED NOT DETECTED Final   Staphylococcus epidermidis NOT DETECTED NOT DETECTED Final   Staphylococcus lugdunensis NOT DETECTED NOT DETECTED Final   Streptococcus species DETECTED (A) NOT DETECTED Final    Comment: Not Enterococcus species, Streptococcus agalactiae, Streptococcus pyogenes, or Streptococcus pneumoniae. CRITICAL RESULT CALLED TO, READ BACK BY AND VERIFIED WITH: PHARMD E.SINCLAIR AT 0944 ON 06/15/2023 BY T.SAAD.    Streptococcus agalactiae NOT DETECTED NOT DETECTED Final   Streptococcus pneumoniae NOT DETECTED NOT DETECTED Final   Streptococcus pyogenes NOT DETECTED NOT DETECTED Final   A.calcoaceticus-baumannii NOT DETECTED NOT DETECTED Final   Bacteroides fragilis NOT DETECTED NOT DETECTED Final    Enterobacterales NOT DETECTED NOT DETECTED Final   Enterobacter cloacae complex  NOT DETECTED NOT DETECTED Final   Escherichia coli NOT DETECTED NOT DETECTED Final   Klebsiella aerogenes NOT DETECTED NOT DETECTED Final   Klebsiella oxytoca NOT DETECTED NOT DETECTED Final   Klebsiella pneumoniae NOT DETECTED NOT DETECTED Final   Proteus species NOT DETECTED NOT DETECTED Final   Salmonella species NOT DETECTED NOT DETECTED Final   Serratia marcescens NOT DETECTED NOT DETECTED Final   Haemophilus influenzae NOT DETECTED NOT DETECTED Final   Neisseria meningitidis NOT DETECTED NOT DETECTED Final   Pseudomonas aeruginosa NOT DETECTED NOT DETECTED Final   Stenotrophomonas maltophilia NOT DETECTED NOT DETECTED Final   Candida albicans NOT DETECTED NOT DETECTED Final   Candida auris NOT DETECTED NOT DETECTED Final   Candida glabrata NOT DETECTED NOT DETECTED Final   Candida krusei NOT DETECTED NOT DETECTED Final   Candida parapsilosis NOT DETECTED NOT DETECTED Final   Candida tropicalis NOT DETECTED NOT DETECTED Final   Cryptococcus neoformans/gattii NOT DETECTED NOT DETECTED Final    Comment: Performed at [facility], [address], [place], [place] [address]  Resp panel by RT-PCR (RSV, Flu A&B, Covid) Anterior Nasal Swab     Status: None   Collection Time: 06/14/23  5:53 PM   Specimen: Anterior Nasal Swab  Result Value Ref Range Status   SARS Coronavirus 2 by RT PCR NEGATIVE NEGATIVE Final    Comment: (NOTE) SARS-CoV-2 target nucleic acids are NOT DETECTED.  The SARS-CoV-2 RNA is generally detectable in upper respiratory specimens during the acute phase of infection. The lowest concentration of SARS-CoV-2 viral copies this assay can detect is 138 copies/mL. A negative result does not preclude SARS-Cov-2 infection and should not be used as the sole basis for treatment or other patient management decisions. A negative result may occur with  improper specimen collection/handling,  submission of specimen other than nasopharyngeal swab, presence of viral mutation(s) within the areas targeted by this assay, and inadequate number of viral copies(<138 copies/mL). A negative result must be combined with clinical observations, patient history, and epidemiological information. The expected result is Negative.  Fact Sheet for Patients:  BloggerCourse.com  Fact Sheet for Healthcare Providers:  SeriousBroker.it  This test is no t yet approved or cleared by the [place] FDA and  has been authorized for detection and/or diagnosis of SARS-CoV-2 by FDA under an Emergency Use Authorization (EUA). This EUA will remain  in effect (meaning this test can be used) for the duration of the COVID-19 declaration under Section 564(b)(1) of the Act, 21 U.S.C.section 360bbb-3(b)(1), unless the authorization is terminated  or revoked sooner.       Influenza A by PCR NEGATIVE NEGATIVE Final   Influenza B by PCR NEGATIVE NEGATIVE Final    Comment: (NOTE) The Xpert Xpress SARS-CoV-2/FLU/RSV plus assay is intended as an aid in the diagnosis of influenza from Nasopharyngeal swab specimens and should not be used as a sole basis for treatment. Nasal washings and aspirates are unacceptable for Xpert Xpress SARS-CoV-2/FLU/RSV testing.  Fact Sheet for Patients: BloggerCourse.com  Fact Sheet for Healthcare Providers: SeriousBroker.it  This test is not yet approved or cleared by the [place] FDA and has been authorized for detection and/or diagnosis of SARS-CoV-2 by FDA under an Emergency Use Authorization (EUA). This EUA will remain in effect (meaning this test can be used) for the duration of the COVID-19 declaration under Section 564(b)(1) of the Act, 21 U.S.C. section 360bbb-3(b)(1), unless the authorization is terminated or revoked.     Resp Syncytial Virus by  PCR NEGATIVE  NEGATIVE Final    Comment: (NOTE) Fact Sheet for Patients: BloggerCourse.com  Fact Sheet for Healthcare Providers: SeriousBroker.it  This test is not yet approved or cleared by the [place] FDA and has been authorized for detection and/or diagnosis of SARS-CoV-2 by FDA under an Emergency Use Authorization (EUA). This EUA will remain in effect (meaning this test can be used) for the duration of the COVID-19 declaration under Section 564(b)(1) of the Act, 21 U.S.C. section 360bbb-3(b)(1), unless the authorization is terminated or revoked.  Performed at [facility], [address] [place], [place] [address]   Urine Culture     Status: Abnormal   Collection Time: 06/14/23  7:43 PM   Specimen: Urine, Clean Catch  Result Value Ref Range Status   Specimen Description   Final    URINE, CLEAN CATCH Performed at [facility] [address], [place], [place] [address]    Special Requests   Final    NONE Performed at [facility], [address], [place], [place] [address]    Culture (A)  Final    <10,000 COLONIES/mL INSIGNIFICANT GROWTH Performed at [facility], [address], [place], [place] [address]    Report Status 06/16/2023 FINAL  Final  Culture, blood (Routine X 2) w Reflex to ID Panel     Status: None (Preliminary result)   Collection Time: 06/16/23  1:47 PM   Specimen: BLOOD  Result Value Ref Range Status   Specimen Description BLOOD BLOOD RIGHT ARM  Final   Special Requests   Final    BOTTLES DRAWN AEROBIC AND ANAEROBIC Blood Culture adequate volume   Culture   Final    NO GROWTH < 24 HOURS Performed at [facility], [address], [place], [place] [address]    Report Status PENDING  Incomplete  Culture, blood (Routine X 2) w Reflex to ID Panel     Status: None (Preliminary result)   Collection Time: 06/16/23  1:48 PM   Specimen: BLOOD  Result Value Ref Range Status   Specimen Description BLOOD  BLOOD LEFT ARM  Final   Special Requests   Final    BOTTLES DRAWN AEROBIC AND ANAEROBIC Blood Culture adequate volume   Culture   Final    NO GROWTH < 24 HOURS Performed at [facility], [address], [place], [place] [address]    Report Status PENDING  Incomplete      Radiology Studies: DG Orthopantogram Result Date: 06/17/2023 CLINICAL DATA:  [id number]. Abscess. EXAM: ORTHOPANTOGRAM/PANORAMIC COMPARISON:  None. FINDINGS: A single view is provided. There are multiple upper and lower metallic crowns and fillings. There is a metallic crown in the expected location of the right upper front lateral incisor but the root structure is not well seen. This could be on a technical basis, motion artifact or carious involvement of the root structure. There appears to have been some degree of motion at the level of the upper and lower incisors. The 12 year and 6 year molars of the right hemimandible demonstrate severe decay with only the roots remaining of the 6 year molar. The 12 year molar still has a portion of the crown intact but most of it is decayed. Bone recession has occurred along the alveolar process adjacent the right mandibular 6 and 12 year molars and is also somewhat noted along the mandibular incisors and bicuspids. There is slight periapical lucency noted underlying the roots of the right mandibular decayed molars and  also underlying the right mandibular first bicuspid and the left mandibular second bicuspid. The left mandibular 6 year molar has been previously removed with medial tilting of the left mandibular 12 year molar. The upper teeth are not quite as well seen due to motion artifacts but except as above they appear grossly intact without obvious periapical lucency. Both TMJs are normally located. IMPRESSION: 1. Multiple upper and lower metallic crowns and fillings. 2. The root structure of the right upper front lateral incisor is not well seen. This could be on a technical basis, motion  artifact or carious involvement of the root structure. 3. Severe decay of the right mandibular 6 year and 12 year molars with bone erosion along the alveolar process adjacent. 4. Slight periapical lucency underlying the roots of the right mandibular 6 year molar and also underlying the right mandibular first bicuspid and the left mandibular second bicuspid. 5. The left mandibular 6 year molar has been previously removed with medial tilting of the left mandibular 12 year molar. 6. The upper teeth are not as well seen due to motion, but except as above appear grossly intact. Electronically Signed   By: Almira Bar M.D.   On: 06/17/2023 03:20   DG HIP UNILAT WITH PELVIS 2-3 VIEWS RIGHT Result Date: 06/16/2023 CLINICAL DATA:  [id number].  Infection. Right hip pain. EXAM: DG HIP (WITH OR WITHOUT PELVIS) 2-3V RIGHT COMPARISON:  AP pelvis and right hip views 06/04/2023 FINDINGS: There is no evidence of hip fracture or dislocation. There is no evidence of arthropathy or other focal bone abnormality. There are patchy calcific plaques in the right superficial femoral artery with otherwise unremarkable soft tissues. Compare: Unchanged. IMPRESSION: 1. No evidence of fracture or dislocation. 2. Atherosclerosis. Electronically Signed   By: Almira Bar M.D.   On: 06/16/2023 22:06       LOS: 2 days   Signature  -    Susa Raring M.D on 06/17/2023 at 11:56 AM   -  To page go to [email or web address]

## 2023-06-17 NOTE — Evaluation (Signed)
 Physical Therapy Evaluation Patient Details Name: Grant Bowers MRN: [id number] DOB: [birth year] Today's Date: 06/17/2023  History of Present Illness  Pt is 78 yo presenting to [facility] ED on 3/6 due to increased pain in LB with concerns for discitis. 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 PMH: DM II, HTN, mixed hyperlipidemia, LBP, trifascicular block s/p PPM, OSA on CPAP, GERD.  Clinical Impression  Pt is presenting below baseline level of functioning. Currently pt is 2 person Mod A for bed mobility and transfers. Pt is 2 person Min A for sit to stand from EOB with bil UE support. Due to pt current functional status, home set up and available assistance at home recommending skilled physical therapy services 3x/week in order to address strength, balance and functional mobility to decrease risk for falls, injury and re-hospitalization.           If plan is discharge home, recommend the following: A lot of help with walking and/or transfers;A lot of help with bathing/dressing/bathroom;Help with stairs or ramp for entrance;Assistance with Academic librarian (measurements PT);Wheelchair cushion (measurements PT)     Functional Status Assessment Patient has had a recent decline in their functional status and demonstrates the ability to make significant improvements in function in a reasonable and predictable amount of time.     Precautions / Restrictions Precautions Precautions: Fall Restrictions Weight Bearing Restrictions Per Provider Order: No      Mobility  Bed Mobility Overal bed mobility: Needs Assistance Bed Mobility: Supine to Sit, Sit to Supine     Supine to sit: Mod assist Sit to supine: Mod assist   General bed mobility comments: increased time, mod A in/out of bed due to pain in low back and B hips, assist for BLEs and sitting up    Transfers Overall transfer level: Needs assistance Equipment used: 2 person hand held  assist Transfers: Sit to/from Stand, Bed to chair/wheelchair/BSC Sit to Stand: Min assist, +2 safety/equipment   Step pivot transfers: Mod assist, +2 safety/equipment       General transfer comment: mod A for transfer for safety, 2 person HHA, Pt anxious about falling and fear of pain throughout transfer, difficulty with reaching back to control sitting.    Ambulation/Gait     General Gait Details: unable at this time due to pain     Balance Overall balance assessment: Needs assistance Sitting-balance support: No upper extremity supported, Feet supported Sitting balance-Leahy Scale: Fair Sitting balance - Comments: no LOB while sitting, significant pain sitting upright   Standing balance support: Bilateral upper extremity supported, During functional activity, Reliant on assistive device for balance Standing balance-Leahy Scale: Poor Standing balance comment: reliant on 2 HHA for safety/balance           Pertinent Vitals/Pain Pain Assessment Pain Assessment: Faces Faces Pain Scale: Hurts whole lot Pain Location: B hips, low back Pain Descriptors / Indicators: Grimacing, Sharp, Sore, Shooting Pain Intervention(s): Monitored during session, Limited activity within patient's tolerance    Home Living Family/patient expects to be discharged to:: Private residence Living Arrangements: Spouse/significant other Available Help at Discharge: Family;Available 24 hours/day Type of Home: House Home Access: Stairs to enter Entrance Stairs-Rails: Right;Left;Can reach both Entrance Stairs-Number of Steps: 2   Home Layout: One level Home Equipment: Agricultural consultant (2 wheels);Cane - single point;Shower seat;BSC/3in1;Grab bars - tub/shower Additional Comments: Pt lives with wife who is available 24/7    Prior Function Prior Level of Function : Independent/Modified Independent;Driving  Extremity/Trunk Assessment   Upper Extremity Assessment Upper Extremity Assessment:  Defer to OT evaluation    Lower Extremity Assessment Lower Extremity Assessment: Overall WFL for tasks assessed    Cervical / Trunk Assessment Cervical / Trunk Assessment: Normal  Communication   Communication Communication: No apparent difficulties    Cognition Arousal: Alert Behavior During Therapy: Anxious   PT - Cognitive impairments: No apparent impairments       Following commands: Intact       Cueing Cueing Techniques: Verbal cues, Tactile cues     General Comments General comments (skin integrity, edema, etc.): Redness noted at the gluteal cleft.        Assessment/Plan    PT Assessment Patient needs continued PT services  PT Problem List Decreased strength;Decreased activity tolerance;Decreased balance;Decreased mobility;Pain       PT Treatment Interventions DME instruction;Gait training;Stair training;Functional mobility training;Therapeutic activities;Therapeutic exercise;Balance training;Patient/family education    PT Goals (Current goals can be found in the Care Plan section)  Acute Rehab PT Goals Patient Stated Goal: Decrease pain PT Goal Formulation: With patient Time For Goal Achievement: 07/01/23 Potential to Achieve Goals: Good    Frequency Min 3X/week     Co-evaluation PT/OT/SLP Co-Evaluation/Treatment: Yes Reason for Co-Treatment: Complexity of the patient's impairments (multi-system involvement);For patient/therapist safety;To address functional/ADL transfers PT goals addressed during session: Mobility/safety with mobility;Balance OT goals addressed during session: ADL's and self-care       AM-PAC PT "6 Clicks" Mobility  Outcome Measure Help needed turning from your back to your side while in a flat bed without using bedrails?: Total Help needed moving from lying on your back to sitting on the side of a flat bed without using bedrails?: Total Help needed moving to and from a bed to a chair (including a wheelchair)?: Total Help  needed standing up from a chair using your arms (e.g., wheelchair or bedside chair)?: Total Help needed to walk in hospital room?: Total Help needed climbing 3-5 steps with a railing? : Total 6 Click Score: 6    End of Session Equipment Utilized During Treatment: Gait belt Activity Tolerance: Patient tolerated treatment well;Patient limited by pain Patient left: in bed;with call bell/phone within reach;with bed alarm set Nurse Communication: Mobility status PT Visit Diagnosis: Unsteadiness on feet (R26.81);Other abnormalities of gait and mobility (R26.89);Muscle weakness (generalized) (M62.81)    Time: 8657-8469 PT Time Calculation (min) (ACUTE ONLY): 28 min   Charges:   PT Evaluation $PT Eval Low Complexity: 1 Low   PT General Charges $$ ACUTE PT VISIT: 1 Visit        Harrel Carina, DPT, CLT  Acute Rehabilitation Services Office: [phone] (Secure chat preferred)   Claudia Desanctis 06/17/2023, 4:17 PM

## 2023-06-17 NOTE — Plan of Care (Signed)
  Problem: Coping: Goal: Ability to adjust to condition or change in health will improve Outcome: Progressing   Problem: Clinical Measurements: Goal: Will remain free from infection Outcome: Progressing   Problem: Activity: Goal: Risk for activity intolerance will decrease Outcome: Progressing   Problem: Coping: Goal: Level of anxiety will decrease Outcome: Progressing   Problem: Pain Managment: Goal: General experience of comfort will improve and/or be controlled Outcome: Progressing

## 2023-06-17 NOTE — Progress Notes (Signed)
   [facility] has been requested to perform a transesophageal echocardiogram on [name] for discitis and bacteremia.  After careful review of history and examination, the risks and benefits of transesophageal echocardiogram have been explained including risks of esophageal damage, perforation (1:10,000 risk), bleeding, pharyngeal hematoma as well as other potential complications associated with anesthesia including aspiration, arrhythmia, respiratory failure and death. Alternatives to treatment were discussed, questions were answered. Patient is willing to proceed.   78 year old who has had a pacemaker came in with back pain and found to have discitis and bacteremia.  Pending TEE to rule out endocarditis and pacemaker lead infection.  EP service to consult after TEE tomorrow.  Vital signs stable.  Hemoglobin 9.0.  Platelet normal.  No obvious contraindication to TEE.  [place], [place] 06/17/2023 5:58 PM

## 2023-06-17 NOTE — Evaluation (Signed)
 Occupational Therapy Evaluation Patient Details Name: Grant Bowers MRN: [id number] DOB: Apr 01, 1946 Today's Date: 06/17/2023   History of Present Illness   Pt is 78 yo presenting to [facility] ED on 3/6 due to increased pain in LB with concerns for discitis. 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 PMH: DM II, HTN, mixed hyperlipidemia, LBP, trifascicular block s/p PPM, OSA on CPAP, GERD.     Clinical Impressions Pt c/o significant pain to low back and B hips, increases with activity. Pt lives with wife who is available 24/7, 2 STE, one story house, PLOF independent. Pt currently requires mod A for bed mobility and transfers to [facility], significant stiffness and pain to low back during activity. Pt anxious throughout session due to fear of pain and OOB activity, but also motivated to improve and manage pain. Pt would benefit from [facility] follow up at this time, also recommending wheelchair and [facility] for return to home. Hopeful that Pt will improve as pain becomes more manageable and may not need wheelchair, will follow acutely and progress as able.      If plan is discharge home, recommend the following:   A lot of help with walking and/or transfers;A lot of help with bathing/dressing/bathroom;Assistance with cooking/housework;Assist for transportation;Help with stairs or ramp for entrance     Functional Status Assessment   Patient has had a recent decline in their functional status and demonstrates the ability to make significant improvements in function in a reasonable and predictable amount of time.     Equipment Recommendations   BSC/3in1;Wheelchair (measurements OT);Wheelchair cushion (measurements OT)     Recommendations for Other Services         Precautions/Restrictions   Precautions Precautions: Fall Restrictions Weight Bearing Restrictions Per Provider Order: No     Mobility Bed Mobility Overal bed mobility: Needs Assistance Bed Mobility: Supine to Sit, Sit to Supine      Supine to sit: Mod assist Sit to supine: Mod assist   General bed mobility comments: increased time, mod A in/out of bed due to pain in low back and B hips, assist for BLEs and sitting up    Transfers Overall transfer level: Needs assistance Equipment used: 2 person hand held assist Transfers: Sit to/from Stand, Bed to chair/wheelchair/BSC Sit to Stand: Min assist, +2 safety/equipment     Step pivot transfers: Mod assist, +2 safety/equipment     General transfer comment: mod A for transfer for safety, 2 person HHA, Pt anxious about falling and fear of pain throughout transfer, difficulty with reaching back to control sitting.      Balance Overall balance assessment: Needs assistance Sitting-balance support: No upper extremity supported, Feet supported Sitting balance-Leahy Scale: Fair Sitting balance - Comments: no LOB while sitting, significant pain sitting upright   Standing balance support: Bilateral upper extremity supported, During functional activity, Reliant on assistive device for balance Standing balance-Leahy Scale: Poor Standing balance comment: reliant on 2 HHA for safety/balance                           ADL either performed or assessed with clinical judgement   ADL Overall ADL's : Needs assistance/impaired Eating/Feeding: Independent   Grooming: Set up;Sitting   Upper Body Bathing: Minimal assistance;Sitting   Lower Body Bathing: Maximal assistance;Sitting/lateral leans   Upper Body Dressing : Minimal assistance;Sitting   Lower Body Dressing: Maximal assistance;Sitting/lateral leans;Sit to/from stand   Toilet Transfer: Moderate assistance;BSC/3in1;+2 for safety/equipment   Toileting- Clothing Manipulation and Hygiene:  Minimal assistance;Sitting/lateral lean         General ADL Comments: Pt min A for UB dressingbathing, max A for LB ADLs. Significant pain with OOB activity to B hips and low back. Pt assisted to [facility] with mod A x2 for safety      Vision Baseline Vision/History: 0 No visual deficits Ability to See in Adequate Light: 0 Adequate       Perception         Praxis         Pertinent Vitals/Pain Pain Assessment Pain Assessment: Faces Faces Pain Scale: Hurts whole lot Pain Location: B hips, low back Pain Descriptors / Indicators: Grimacing, Sharp, Sore, Shooting Pain Intervention(s): Monitored during session, Limited activity within patient's tolerance     Extremity/Trunk Assessment Upper Extremity Assessment Upper Extremity Assessment: Overall WFL for tasks assessed   Lower Extremity Assessment Lower Extremity Assessment: Defer to PT evaluation       Communication Communication Communication: No apparent difficulties   Cognition Arousal: Alert Behavior During Therapy: Anxious Cognition: No apparent impairments             OT - Cognition Comments: Pt anxious about movement due to significant pain and fear of pain worsening, but also eager to improve.                 Following commands: Intact       Cueing  General Comments   Cueing Techniques: Verbal cues;Tactile cues      Exercises     Shoulder Instructions      Home Living Family/patient expects to be discharged to:: Private residence Living Arrangements: Spouse/significant other Available Help at Discharge: Family;Available 24 hours/day Type of Home: House Home Access: Stairs to enter [employer] of Steps: 2 Entrance Stairs-Rails: Right;Left;Can reach both Home Layout: One level     Bathroom Shower/Tub: Producer, television/film/video: Standard Bathroom Accessibility: Yes   Home Equipment: Agricultural consultant (2 wheels);Cane - single point;Shower seat;BSC/3in1;Grab bars - tub/shower   Additional Comments: Pt lives with wife who is available 24/7      Prior Functioning/Environment Prior Level of Function : Independent/Modified Independent;Driving                    OT Problem List:  Decreased range of motion;Decreased strength;Decreased activity tolerance;Impaired balance (sitting and/or standing);Decreased safety awareness;Pain   OT Treatment/Interventions: Energy conservation;Self-care/ADL training;Therapeutic exercise;DME and/or AE instruction;Therapeutic activities;Patient/family education;Balance training      OT Goals(Current goals can be found in the care plan section)   Acute Rehab OT Goals Patient Stated Goal: to manage pain OT Goal Formulation: With patient Time For Goal Achievement: 07/01/23 Potential to Achieve Goals: Good   OT Frequency:  Min 2X/week    Co-evaluation PT/OT/SLP Co-Evaluation/Treatment: Yes Reason for Co-Treatment: Complexity of the patient's impairments (multi-system involvement);For patient/therapist safety;To address functional/ADL transfers   OT goals addressed during session: ADL's and self-care      AM-PAC OT "6 Clicks" Daily Activity     Outcome Measure Help from another person eating meals?: None Help from another person taking care of personal grooming?: A Little Help from another person toileting, which includes using toliet, bedpan, or urinal?: A Lot Help from another person bathing (including washing, rinsing, drying)?: A Lot Help from another person to put on and taking off regular upper body clothing?: A Little Help from another person to put on and taking off regular lower body clothing?: A Lot 6 Click Score: 16   End  of Session Equipment Utilized During Treatment: Gait belt Nurse Communication: Mobility status  Activity Tolerance: Patient limited by pain Patient left: in bed;with call bell/phone within reach  OT Visit Diagnosis: Unsteadiness on feet (R26.81);Other abnormalities of gait and mobility (R26.89);Muscle weakness (generalized) (M62.81);Pain Pain - part of body: Hip                Time: 1610-9604 OT Time Calculation (min): 33 min Charges:  OT Evaluation $OT Eval Moderate Complexity: 1 [address] OTR/L   Alexis Goodell 06/17/2023, 3:08 PM

## 2023-06-17 NOTE — H&P (View-Only) (Signed)
 [facility] PROGRESS NOTE   Grant Bowers ZOX:[id number] DOB: Dec 29, 1945 DOA: 06/14/2023  PCP: [name], MD  Brief History: 78 y.o. male with medical history significant of  type 2 diabetes mellitus, essential hypertension, mixed hyperlipidemia, low back pain (on oxycodone at home.), trifascicular block s/p PPM,  OSA on CPAP, GERD who presented to the ED emergency department due to worsening low back pain.  Imaging studies raised concern for discitis.  He was hospitalized for further management.    Consultants: Phone discussion with neurosurgery.  Infectious disease, cardiology/EP  Procedures: EEG requested for 06/18/2023    Subjective/Interval History: Seen in bed having some lower back pain, has any fever chills, no chest pain or shortness of breath, no new weakness.    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 Case was discussed with neurosurgery who recommended MRI.  Patient has a pacemaker so he was transferred to [facility]. Informed by MRI tech that due to the type of pacemaker that he has along with the position of the leads he is unfortunately unable to get MRI safely.,  Per neurosurgery no further imaging required. In by ID on IV antibiotics, source likely to oral dentition for which she will require outpatient dental follow-up, since he has a pacemaker TEE has been requested by cardiology on 06/18/2023.  New supportive care with pain control and antibiotics.  Hyponatremia Stable to improved.  Hypokalemia Will be supplemented.  Magnesium was 2.0 yesterday.  Normocytic anemia No evidence of overt bleeding.  QT prolongation Correct electrolytes.  Repeat EKG showed improvement in QT prolongation.  Essential hypertension Noted to be on amlodipine and irbesartan.  Blood pressure is  reasonably well-controlled.  History of BPH Continue alfuzosin.  History of hyperlipidemia Statin.  Recently diagnosed cholelithiasis Outpatient follow-up with general surgery.  Diabetes mellitus type 2 with hyperglycemia Metformin on hold.  Continue SSI.  HbA1c is 6.2.  Lab Results  Component Value Date   HGBA1C 6.2 04/17/2023   CBG (last 3)  Recent Labs    06/16/23 1613 06/16/23 2106 06/17/23 0731  GLUCAP 162* 198* 165*    DVT Prophylaxis: SCDs for now Code Status: Full code Family Communication: Discussed with patient and his wife Disposition Plan: To be determined  Status is: Inpatient Remains inpatient appropriate because: Need for IV antibiotics      Medications: Scheduled:  alfuzosin  10 mg Oral QHS   amLODipine  5 mg Oral Daily   feeding supplement (GLUCERNA SHAKE)  237 mL Oral TID BM   insulin aspart  0-9 Units Subcutaneous TID WC   irbesartan  75 mg Oral Daily   lidocaine  2 patch Transdermal Q24H   LORazepam  1 mg Intravenous Once   polyethylene glycol  17 g Oral Daily   rosuvastatin  20 mg Oral Daily   senna-docusate  2 tablet Oral BID   Continuous:  cefTRIAXone (ROCEPHIN)  IV 2 g (06/17/23 1118)   WJX:BJYNWGNFAOZHY **OR** acetaminophen, HYDROmorphone (DILAUDID) injection, oxyCODONE, prochlorperazine  Antibiotics: Anti-infectives (From admission, onward)    Start     Dose/Rate Route Frequency Ordered Stop   06/16/23 0000  nafcillin injection 2 g  Status:  Discontinued       Note to Pharmacy: Hold for now per ID Dr. Renold Don Please call before initiating antibiotics again   2 g Intravenous Every 4 hours 06/15/23 0756 06/15/23 0947  06/15/23 1200  nafcillin injection 2 g  Status:  Discontinued        2 g Intravenous Every 4 hours 06/15/23 0346 06/15/23 0756   06/15/23 1000  cefTRIAXone (ROCEPHIN) 2 g in sodium chloride 0.9 % 100 mL IVPB        2 g 200 mL/hr over 30 Minutes Intravenous Every 24 hours 06/15/23 0947     06/14/23 2345  vancomycin  (VANCOREADY) IVPB 1750 mg/350 mL        1,750 mg 175 mL/hr over 120 Minutes Intravenous  Once 06/14/23 2336 06/15/23 0228   06/14/23 1800  piperacillin-tazobactam (ZOSYN) IVPB 3.375 g        3.375 g 100 mL/hr over 30 Minutes Intravenous  Once 06/14/23 1749 06/14/23 2144       Objective:  Vital Signs  Vitals:   06/16/23 1918 06/16/23 2343 06/17/23 0442 06/17/23 0856  BP: 122/64 132/66 139/61 132/64  Pulse: 83 92 (!) 103 94  Resp: 16 18 18 18   Temp: 97.8 F (36.6 C) 98 F (36.7 C) 98.3 F (36.8 C) 98.4 F (36.9 C)  TempSrc: Oral Oral Oral Oral  SpO2: 95% 95% 95% 95%  Weight:      Height:        Intake/Output Summary (Last 24 hours) at 06/17/2023 1156 Last data filed at 06/17/2023 0930 Gross per 24 hour  Intake 360 ml  Output 650 ml  Net -290 ml   Filed Weights   06/14/23 1645  Weight: 80.4 kg    Exam  Awake Alert, No new F.N deficits, bilateral plantars downgoing [place].AT,PERRAL Supple Neck, No JVD,   Symmetrical Chest wall movement, Good air movement bilaterally, CTAB RRR,No Gallops, Rubs or new Murmurs,  +ve B.Sounds, Abd Soft, No tenderness,   No Cyanosis, Clubbing or edema     Lab Results:  Data Reviewed: I have personally reviewed following labs and reports of the imaging studies  CBC: Recent Labs  Lab 06/14/23 1725 06/15/23 0428 06/16/23 0554 06/17/23 0559  WBC 15.2* 12.5* 10.6* 9.3  NEUTROABS 14.0*  --   --  7.6  HGB 11.1* 9.7* 9.8* 9.0*  HCT 33.9* 30.9* 30.1* 28.3*  MCV 81.5 83.5 82.5 83.0  PLT 334 272 274 275    Basic Metabolic Panel: Recent Labs  Lab 06/14/23 1725 06/15/23 0428 06/16/23 0554 06/17/23 0559  NA 127* 132* 133* 135  K 4.0 3.8 3.2* 3.9  CL 97* 102 102 103  CO2 17* 23 21* 22  GLUCOSE 287* 179* 158* 177*  BUN 22 25* 22 17  CREATININE 1.44* 1.11 1.01 0.91  CALCIUM 8.4* 8.1* 8.2* 8.2*  MG  --  2.0  --  2.1  PHOS  --  3.8  --   --     GFR: Estimated Creatinine Clearance: 73.4 mL/min (by C-G formula based on SCr of  0.91 mg/dL).  Liver Function Tests: Recent Labs  Lab 06/14/23 1725 06/15/23 0428 06/16/23 0554 06/17/23 0559  AST 20 14* 25 19  ALT 21 20 25 24   ALKPHOS 84 69 74 70  BILITOT 1.2 0.8 0.7 0.8  PROT 6.1* 5.3* 5.3* 5.0*  ALBUMIN 2.5* 2.3* 2.2* 2.1*    Recent Labs  Lab 06/14/23 1725  LIPASE 20    CBG: Recent Labs  Lab 06/16/23 0834 06/16/23 1302 06/16/23 1613 06/16/23 2106 06/17/23 0731  GLUCAP 153* 179* 162* 198* 165*     Recent Results (from the past 240 hours)  Culture, blood (routine x 2)  Status: Abnormal (Preliminary result)   Collection Time: 06/14/23  5:25 PM   Specimen: Left Antecubital; Blood  Result Value Ref Range Status   Specimen Description   Final    LEFT ANTECUBITAL BOTTLES DRAWN AEROBIC AND ANAEROBIC Performed at [facility], [address], [place], [place] [address]    Special Requests   Final    Blood Culture results may not be optimal due to an inadequate volume of blood received in culture bottles Performed at [facility], [address] [place], [place] [address]    Culture  Setup Time   Final    GRAM POSITIVE COCCI IN BOTH AEROBIC AND ANAEROBIC BOTTLES Gram Stain Report Called to,Read Back By and Verified With: NICHOLS @ 0649 ON [id number] BY HENDERSON L CRITICAL RESULT CALLED TO, READ BACK BY AND VERIFIED WITH: PHARMD E.SINCLAIR AT 0944 ON 06/15/2023 BY T.SAAD. Performed at [facility], [address], [place], [place] [address]    Culture STREPTOCOCCUS MITIS/ORALIS (A)  Final   Report Status PENDING  Incomplete   Organism ID, Bacteria STREPTOCOCCUS MITIS/ORALIS  Final      Susceptibility   Streptococcus mitis/oralis - MIC*    PENICILLIN <=0.06 SENSITIVE Sensitive     CEFTRIAXONE <=0.12 SENSITIVE Sensitive     LEVOFLOXACIN 0.5 SENSITIVE Sensitive     VANCOMYCIN 0.5 SENSITIVE Sensitive     * STREPTOCOCCUS MITIS/ORALIS  Culture, blood (routine x 2)     Status: Abnormal (Preliminary result)   Collection Time: 06/14/23  5:25 PM    Specimen: BLOOD LEFT FOREARM  Result Value Ref Range Status   Specimen Description   Final    BLOOD LEFT FOREARM BOTTLES DRAWN AEROBIC AND ANAEROBIC Performed at [facility], [address], [place], [place] [address]    Special Requests   Final    Blood Culture adequate volume Performed at [facility], [address], [place], [place] [address]    Culture  Setup Time   Final    GRAM POSITIVE COCCI IN BOTH AEROBIC AND ANAEROBIC BOTTLES Gram Stain Report Called to,Read Back By and Verified With: NICHOLS @ 0649 ON [id number] BY HENDERSON L CRITICAL VALUE NOTED.  VALUE IS CONSISTENT WITH PREVIOUSLY REPORTED AND CALLED VALUE. Performed at [facility], [address] [place], [place] [address]    Culture STREPTOCOCCUS MITIS/ORALIS (A)  Final   Report Status PENDING  Incomplete  Blood Culture ID Panel (Reflexed)     Status: Abnormal   Collection Time: 06/14/23  5:25 PM  Result Value Ref Range Status   Enterococcus faecalis NOT DETECTED NOT DETECTED Final   Enterococcus Faecium NOT DETECTED NOT DETECTED Final   Listeria monocytogenes NOT DETECTED NOT DETECTED Final   Staphylococcus species NOT DETECTED NOT DETECTED Final   Staphylococcus aureus (BCID) NOT DETECTED NOT DETECTED Final   Staphylococcus epidermidis NOT DETECTED NOT DETECTED Final   Staphylococcus lugdunensis NOT DETECTED NOT DETECTED Final   Streptococcus species DETECTED (A) NOT DETECTED Final    Comment: Not Enterococcus species, Streptococcus agalactiae, Streptococcus pyogenes, or Streptococcus pneumoniae. CRITICAL RESULT CALLED TO, READ BACK BY AND VERIFIED WITH: PHARMD E.SINCLAIR AT 0944 ON 06/15/2023 BY T.SAAD.    Streptococcus agalactiae NOT DETECTED NOT DETECTED Final   Streptococcus pneumoniae NOT DETECTED NOT DETECTED Final   Streptococcus pyogenes NOT DETECTED NOT DETECTED Final   A.calcoaceticus-baumannii NOT DETECTED NOT DETECTED Final   Bacteroides fragilis NOT DETECTED NOT DETECTED Final    Enterobacterales NOT DETECTED NOT DETECTED Final   Enterobacter cloacae complex  NOT DETECTED NOT DETECTED Final   Escherichia coli NOT DETECTED NOT DETECTED Final   Klebsiella aerogenes NOT DETECTED NOT DETECTED Final   Klebsiella oxytoca NOT DETECTED NOT DETECTED Final   Klebsiella pneumoniae NOT DETECTED NOT DETECTED Final   Proteus species NOT DETECTED NOT DETECTED Final   Salmonella species NOT DETECTED NOT DETECTED Final   Serratia marcescens NOT DETECTED NOT DETECTED Final   Haemophilus influenzae NOT DETECTED NOT DETECTED Final   Neisseria meningitidis NOT DETECTED NOT DETECTED Final   Pseudomonas aeruginosa NOT DETECTED NOT DETECTED Final   Stenotrophomonas maltophilia NOT DETECTED NOT DETECTED Final   Candida albicans NOT DETECTED NOT DETECTED Final   Candida auris NOT DETECTED NOT DETECTED Final   Candida glabrata NOT DETECTED NOT DETECTED Final   Candida krusei NOT DETECTED NOT DETECTED Final   Candida parapsilosis NOT DETECTED NOT DETECTED Final   Candida tropicalis NOT DETECTED NOT DETECTED Final   Cryptococcus neoformans/gattii NOT DETECTED NOT DETECTED Final    Comment: Performed at [facility], [address], [place], [place] [address]  Resp panel by RT-PCR (RSV, Flu A&B, Covid) Anterior Nasal Swab     Status: None   Collection Time: 06/14/23  5:53 PM   Specimen: Anterior Nasal Swab  Result Value Ref Range Status   SARS Coronavirus 2 by RT PCR NEGATIVE NEGATIVE Final    Comment: (NOTE) SARS-CoV-2 target nucleic acids are NOT DETECTED.  The SARS-CoV-2 RNA is generally detectable in upper respiratory specimens during the acute phase of infection. The lowest concentration of SARS-CoV-2 viral copies this assay can detect is 138 copies/mL. A negative result does not preclude SARS-Cov-2 infection and should not be used as the sole basis for treatment or other patient management decisions. A negative result may occur with  improper specimen collection/handling,  submission of specimen other than nasopharyngeal swab, presence of viral mutation(s) within the areas targeted by this assay, and inadequate number of viral copies(<138 copies/mL). A negative result must be combined with clinical observations, patient history, and epidemiological information. The expected result is Negative.  Fact Sheet for Patients:  BloggerCourse.com  Fact Sheet for Healthcare Providers:  SeriousBroker.it  This test is no t yet approved or cleared by the [place] FDA and  has been authorized for detection and/or diagnosis of SARS-CoV-2 by FDA under an Emergency Use Authorization (EUA). This EUA will remain  in effect (meaning this test can be used) for the duration of the COVID-19 declaration under Section 564(b)(1) of the Act, 21 U.S.C.section 360bbb-3(b)(1), unless the authorization is terminated  or revoked sooner.       Influenza A by PCR NEGATIVE NEGATIVE Final   Influenza B by PCR NEGATIVE NEGATIVE Final    Comment: (NOTE) The Xpert Xpress SARS-CoV-2/FLU/RSV plus assay is intended as an aid in the diagnosis of influenza from Nasopharyngeal swab specimens and should not be used as a sole basis for treatment. Nasal washings and aspirates are unacceptable for Xpert Xpress SARS-CoV-2/FLU/RSV testing.  Fact Sheet for Patients: BloggerCourse.com  Fact Sheet for Healthcare Providers: SeriousBroker.it  This test is not yet approved or cleared by the [place] FDA and has been authorized for detection and/or diagnosis of SARS-CoV-2 by FDA under an Emergency Use Authorization (EUA). This EUA will remain in effect (meaning this test can be used) for the duration of the COVID-19 declaration under Section 564(b)(1) of the Act, 21 U.S.C. section 360bbb-3(b)(1), unless the authorization is terminated or revoked.     Resp Syncytial Virus by  PCR NEGATIVE  NEGATIVE Final    Comment: (NOTE) Fact Sheet for Patients: BloggerCourse.com  Fact Sheet for Healthcare Providers: SeriousBroker.it  This test is not yet approved or cleared by the [place] FDA and has been authorized for detection and/or diagnosis of SARS-CoV-2 by FDA under an Emergency Use Authorization (EUA). This EUA will remain in effect (meaning this test can be used) for the duration of the COVID-19 declaration under Section 564(b)(1) of the Act, 21 U.S.C. section 360bbb-3(b)(1), unless the authorization is terminated or revoked.  Performed at [facility], [address] [place], [place] [address]   Urine Culture     Status: Abnormal   Collection Time: 06/14/23  7:43 PM   Specimen: Urine, Clean Catch  Result Value Ref Range Status   Specimen Description   Final    URINE, CLEAN CATCH Performed at [facility] [address], [place], [place] [address]    Special Requests   Final    NONE Performed at [facility], [address], [place], [place] [address]    Culture (A)  Final    <10,000 COLONIES/mL INSIGNIFICANT GROWTH Performed at [facility], [address], [place], [place] [address]    Report Status 06/16/2023 FINAL  Final  Culture, blood (Routine X 2) w Reflex to ID Panel     Status: None (Preliminary result)   Collection Time: 06/16/23  1:47 PM   Specimen: BLOOD  Result Value Ref Range Status   Specimen Description BLOOD BLOOD RIGHT ARM  Final   Special Requests   Final    BOTTLES DRAWN AEROBIC AND ANAEROBIC Blood Culture adequate volume   Culture   Final    NO GROWTH < 24 HOURS Performed at [facility], [address], [place], [place] [address]    Report Status PENDING  Incomplete  Culture, blood (Routine X 2) w Reflex to ID Panel     Status: None (Preliminary result)   Collection Time: 06/16/23  1:48 PM   Specimen: BLOOD  Result Value Ref Range Status   Specimen Description BLOOD  BLOOD LEFT ARM  Final   Special Requests   Final    BOTTLES DRAWN AEROBIC AND ANAEROBIC Blood Culture adequate volume   Culture   Final    NO GROWTH < 24 HOURS Performed at [facility], [address], [place], [place] [address]    Report Status PENDING  Incomplete      Radiology Studies: DG Orthopantogram Result Date: 06/17/2023 CLINICAL DATA:  [id number]. Abscess. EXAM: ORTHOPANTOGRAM/PANORAMIC COMPARISON:  None. FINDINGS: A single view is provided. There are multiple upper and lower metallic crowns and fillings. There is a metallic crown in the expected location of the right upper front lateral incisor but the root structure is not well seen. This could be on a technical basis, motion artifact or carious involvement of the root structure. There appears to have been some degree of motion at the level of the upper and lower incisors. The 12 year and 6 year molars of the right hemimandible demonstrate severe decay with only the roots remaining of the 6 year molar. The 12 year molar still has a portion of the crown intact but most of it is decayed. Bone recession has occurred along the alveolar process adjacent the right mandibular 6 and 12 year molars and is also somewhat noted along the mandibular incisors and bicuspids. There is slight periapical lucency noted underlying the roots of the right mandibular decayed molars and  also underlying the right mandibular first bicuspid and the left mandibular second bicuspid. The left mandibular 6 year molar has been previously removed with medial tilting of the left mandibular 12 year molar. The upper teeth are not quite as well seen due to motion artifacts but except as above they appear grossly intact without obvious periapical lucency. Both TMJs are normally located. IMPRESSION: 1. Multiple upper and lower metallic crowns and fillings. 2. The root structure of the right upper front lateral incisor is not well seen. This could be on a technical basis, motion  artifact or carious involvement of the root structure. 3. Severe decay of the right mandibular 6 year and 12 year molars with bone erosion along the alveolar process adjacent. 4. Slight periapical lucency underlying the roots of the right mandibular 6 year molar and also underlying the right mandibular first bicuspid and the left mandibular second bicuspid. 5. The left mandibular 6 year molar has been previously removed with medial tilting of the left mandibular 12 year molar. 6. The upper teeth are not as well seen due to motion, but except as above appear grossly intact. Electronically Signed   By: Almira Bar M.D.   On: 06/17/2023 03:20   DG HIP UNILAT WITH PELVIS 2-3 VIEWS RIGHT Result Date: 06/16/2023 CLINICAL DATA:  [id number].  Infection. Right hip pain. EXAM: DG HIP (WITH OR WITHOUT PELVIS) 2-3V RIGHT COMPARISON:  AP pelvis and right hip views 06/04/2023 FINDINGS: There is no evidence of hip fracture or dislocation. There is no evidence of arthropathy or other focal bone abnormality. There are patchy calcific plaques in the right superficial femoral artery with otherwise unremarkable soft tissues. Compare: Unchanged. IMPRESSION: 1. No evidence of fracture or dislocation. 2. Atherosclerosis. Electronically Signed   By: Almira Bar M.D.   On: 06/16/2023 22:06       LOS: 2 days   Signature  -    Susa Raring M.D on 06/17/2023 at 11:56 AM   -  To page go to [email or web address]

## 2023-06-17 NOTE — Progress Notes (Signed)
  Echocardiogram 2D Echocardiogram has been performed.  Delcie Roch 06/17/2023, 5:58 PM

## 2023-06-18 ENCOUNTER — Inpatient Hospital Stay (HOSPITAL_COMMUNITY): Admitting: Certified Registered Nurse Anesthetist

## 2023-06-18 ENCOUNTER — Inpatient Hospital Stay (HOSPITAL_COMMUNITY)

## 2023-06-18 ENCOUNTER — Encounter (HOSPITAL_COMMUNITY)
Admission: EM | Disposition: A | Discharge status: Home Health | Payer: Self-pay | Source: Ambulatory Visit | Attending: Internal Medicine

## 2023-06-18 DIAGNOSIS — I340 Nonrheumatic mitral (valve) insufficiency: Secondary | ICD-10-CM

## 2023-06-18 DIAGNOSIS — Z950 Presence of cardiac pacemaker: Secondary | ICD-10-CM | POA: Diagnosis not present

## 2023-06-18 DIAGNOSIS — R7881 Bacteremia: Secondary | ICD-10-CM

## 2023-06-18 DIAGNOSIS — B955 Unspecified streptococcus as the cause of diseases classified elsewhere: Secondary | ICD-10-CM

## 2023-06-18 DIAGNOSIS — I453 Trifascicular block: Secondary | ICD-10-CM | POA: Diagnosis not present

## 2023-06-18 DIAGNOSIS — I10 Essential (primary) hypertension: Secondary | ICD-10-CM

## 2023-06-18 DIAGNOSIS — M4626 Osteomyelitis of vertebra, lumbar region: Secondary | ICD-10-CM | POA: Diagnosis not present

## 2023-06-18 DIAGNOSIS — G4733 Obstructive sleep apnea (adult) (pediatric): Secondary | ICD-10-CM

## 2023-06-18 DIAGNOSIS — B9689 Other specified bacterial agents as the cause of diseases classified elsewhere: Secondary | ICD-10-CM | POA: Diagnosis not present

## 2023-06-18 DIAGNOSIS — M4620 Osteomyelitis of vertebra, site unspecified: Secondary | ICD-10-CM | POA: Diagnosis not present

## 2023-06-18 DIAGNOSIS — R001 Bradycardia, unspecified: Secondary | ICD-10-CM

## 2023-06-18 HISTORY — PX: TRANSESOPHAGEAL ECHOCARDIOGRAM (CATH LAB): EP1270

## 2023-06-18 LAB — COMPREHENSIVE METABOLIC PANEL
ALT: 29 U/L (ref 0–44)
AST: 23 U/L (ref 15–41)
Albumin: 2.1 g/dL — ABNORMAL LOW (ref 3.5–5.0)
Alkaline Phosphatase: 64 U/L (ref 38–126)
Anion gap: 6 (ref 5–15)
BUN: 15 mg/dL (ref 8–23)
CO2: 23 mmol/L (ref 22–32)
Calcium: 8.3 mg/dL — ABNORMAL LOW (ref 8.9–10.3)
Chloride: 107 mmol/L (ref 98–111)
Creatinine, Ser: 1.02 mg/dL (ref 0.61–1.24)
GFR, Estimated: >60 mL/min (ref >60)
Glucose, Bld: 156 mg/dL — ABNORMAL HIGH (ref 70–99)
Potassium: 4.3 mmol/L (ref 3.5–5.1)
Sodium: 136 mmol/L (ref 135–145)
Total Bilirubin: 0.7 mg/dL (ref 0.0–1.2)
Total Protein: 5 g/dL — ABNORMAL LOW (ref 6.5–8.1)

## 2023-06-18 LAB — CBC WITH DIFFERENTIAL/PLATELET
Abs Immature Granulocytes: 0.04 10*3/uL (ref 0.00–0.07)
Basophils Absolute: 0 10*3/uL (ref 0.0–0.1)
Basophils Relative: 0 %
Eosinophils Absolute: 0.1 10*3/uL (ref 0.0–0.5)
Eosinophils Relative: 1 %
HCT: 27.3 % — ABNORMAL LOW (ref 39.0–52.0)
Hemoglobin: 8.8 g/dL — ABNORMAL LOW (ref 13.0–17.0)
Immature Granulocytes: 0 %
Lymphocytes Relative: 10 %
Lymphs Abs: 1 10*3/uL (ref 0.7–4.0)
MCH: 26.7 pg (ref 26.0–34.0)
MCHC: 32.2 g/dL (ref 30.0–36.0)
MCV: 82.7 fL (ref 80.0–100.0)
Monocytes Absolute: 0.7 10*3/uL (ref 0.1–1.0)
Monocytes Relative: 7 %
Neutro Abs: 8.1 10*3/uL — ABNORMAL HIGH (ref 1.7–7.7)
Neutrophils Relative %: 82 %
Platelets: 285 10*3/uL (ref 150–400)
RBC: 3.3 MIL/uL — ABNORMAL LOW (ref 4.22–5.81)
RDW: 14.8 % (ref 11.5–15.5)
WBC: 9.9 10*3/uL (ref 4.0–10.5)
nRBC: 0 % (ref 0.0–0.2)

## 2023-06-18 LAB — PHOSPHORUS: Phosphorus: 3.2 mg/dL (ref 2.5–4.6)

## 2023-06-18 LAB — C-REACTIVE PROTEIN: CRP: 9.3 mg/dL — ABNORMAL HIGH (ref <1.0)

## 2023-06-18 LAB — GLUCOSE, CAPILLARY
Glucose-Capillary: 167 mg/dL — ABNORMAL HIGH (ref 70–99)
Glucose-Capillary: 257 mg/dL — ABNORMAL HIGH (ref 70–99)
Glucose-Capillary: 112 mg/dL — ABNORMAL HIGH (ref 70–99)
Glucose-Capillary: 169 mg/dL — ABNORMAL HIGH (ref 70–99)

## 2023-06-18 LAB — PROCALCITONIN: Procalcitonin: 0.35 ng/mL

## 2023-06-18 LAB — MAGNESIUM: Magnesium: 2 mg/dL (ref 1.7–2.4)

## 2023-06-18 LAB — BRAIN NATRIURETIC PEPTIDE: B Natriuretic Peptide: 191.4 pg/mL — ABNORMAL HIGH (ref 0.0–100.0)

## 2023-06-18 LAB — ECHO TEE

## 2023-06-18 LAB — MRSA NEXT GEN BY PCR, NASAL: MRSA by PCR Next Gen: NOT DETECTED

## 2023-06-18 SURGERY — TRANSESOPHAGEAL ECHOCARDIOGRAM (TEE) (CATHLAB)
Anesthesia: Monitor Anesthesia Care

## 2023-06-18 MED ORDER — ENSURE ENLIVE PO LIQD
237.0000 mL | Freq: Two times a day (BID) | ORAL | Status: DC
  Administered 2023-06-20: 237 mL via ORAL

## 2023-06-18 MED ORDER — PROPOFOL 500 MG/50ML IV EMUL
INTRAVENOUS | Status: DC | PRN
  Administered 2023-06-18: 125 ug/kg/min via INTRAVENOUS

## 2023-06-18 MED ORDER — PROPOFOL 10 MG/ML IV BOLUS
INTRAVENOUS | Status: DC | PRN
  Administered 2023-06-18 (×2): 20 mg via INTRAVENOUS

## 2023-06-18 MED ORDER — HEPARIN SODIUM (PORCINE) 5000 UNIT/ML IJ SOLN
5000.0000 [IU] | Freq: Three times a day (TID) | INTRAMUSCULAR | Status: DC
  Administered 2023-06-18 – 2023-06-21 (×9): 5000 [IU] via SUBCUTANEOUS
  Filled 2023-06-18 (×9): qty 1

## 2023-06-18 MED ORDER — PHENYLEPHRINE HCL (PRESSORS) 10 MG/ML IV SOLN
INTRAVENOUS | Status: DC | PRN
  Administered 2023-06-18: 160 ug via INTRAVENOUS

## 2023-06-18 MED ORDER — SODIUM CHLORIDE 0.9 % IV SOLN
INTRAVENOUS | Status: DC | PRN
Start: 2023-06-18 — End: 2023-06-18

## 2023-06-18 NOTE — Progress Notes (Signed)
 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  He had WBC 15K.  Blood cultures grew 2/2 sets strep mitis/oralis.  Transition from Vanco pip-tazo to ceftriaxone. - CT abdomen pelvis shows concern for osteomyelitis at L4.  Given he has pacemaker not compatible MRI.  Forego MRI at this time.  IR aspiration likely not necessary given is probably the same organism as in the blood. -Pt states back pain has been ongoing for months.  -TEE noted sessile mitral valve veg in anterior leaflet, sever ME. Recommendations:  - Continue ceftriaxone -Plan on PPM extraction with EP given mitral valve findings and bacteremia. -Place PICC when repeat blood Cx form 3/8 NG x 72h. Plan on ctx x 6  weeks followed by 2 weeks of cefadroxil to complete 8 weeks of abx for vertebral infection which will cover endocarditis -Needs to see dentistry outpatient given poor dentition and peripheral lucency with molar decay  Evaluation of this patient requires complex antimicrobial therapy evaluation and counseling + isolation needs for disease transmission risk assessment and mitigation    Microbiology:    Antibiotics: Pip-tazo 3/6 vanc 3/6 Ceftriaxone 3/7-   Cultures: Blood 3/6 s2/2 strep mitis/oralis Urine 3/6 insignificant growth   SUBJECTIVE: Sitting in chair Interval: Afebirle overingiht  Review of Systems: Review of Systems  All other systems reviewed and are negative.    Scheduled Meds:  alfuzosin  10 mg Oral QHS   amLODipine  5 mg Oral Daily   feeding supplement  237 mL Oral BID BM   feeding supplement (GLUCERNA SHAKE)  237 mL Oral TID BM   heparin injection (subcutaneous)  5,000 Units Subcutaneous Q8H   insulin aspart  0-9 Units Subcutaneous TID WC   irbesartan  75 mg Oral Daily   lidocaine  2 patch Transdermal Q24H   LORazepam  1 mg Intravenous Once   polyethylene glycol  17 g Oral Daily   rosuvastatin  20 mg Oral Daily   senna-docusate  2 tablet Oral BID   Continuous Infusions:  cefTRIAXone (ROCEPHIN)  IV 2 g (06/18/23 1112)   PRN Meds:.acetaminophen **OR** acetaminophen, HYDROmorphone (DILAUDID) injection, oxyCODONE, prochlorperazine No Known Allergies  OBJECTIVE: Vitals:   06/18/23 0830 06/18/23 0933 06/18/23 1526 06/18/23 2034  BP: (!) 115/44 124/60 111/60 (!) 134/57  Pulse: 97 85 95   Resp: (!) 23 (!) 22 20   Temp:   97.9 F (36.6 C) 97.8 F (36.6 C)  TempSrc:   Oral Oral  SpO2: 96% 96% 94%   Weight:      Height:       Body mass index is 24.04 kg/m.  Physical Exam Constitutional:  General: He is not in acute distress.    Appearance: He is normal weight. He is not toxic-appearing.  HENT:     Head: Normocephalic and atraumatic.     Right Ear: External ear normal.     Left Ear: External ear normal.     Nose: No congestion or rhinorrhea.     Mouth/Throat:     Mouth: Mucous membranes are moist.     Pharynx: Oropharynx is clear.  Eyes:     Extraocular Movements: Extraocular movements intact.     Conjunctiva/sclera: Conjunctivae normal.     Pupils: Pupils are equal, round, and reactive to light.  Cardiovascular:     Rate and  Rhythm: Normal rate and regular rhythm.     Heart sounds: No murmur heard.    No friction rub. No gallop.  Pulmonary:     Effort: Pulmonary effort is normal.     Breath sounds: Normal breath sounds.  Abdominal:     General: Abdomen is flat. Bowel sounds are normal.     Palpations: Abdomen is soft.  Musculoskeletal:        General: No swelling. Normal range of motion.     Cervical back: Normal range of motion and neck supple.  Skin:    General: Skin is warm and dry.  Neurological:     General: No focal deficit present.     Mental Status: He is oriented to person, place, and time.  Psychiatric:        Mood and Affect: Mood normal.       Lab Results Lab Results  Component Value Date   WBC 9.9 06/18/2023   HGB 8.8 (L) 06/18/2023   HCT 27.3 (L) 06/18/2023   MCV 82.7 06/18/2023   PLT 285 06/18/2023    Lab Results  Component Value Date   CREATININE 1.02 06/18/2023   BUN 15 06/18/2023   NA 136 06/18/2023   K 4.3 06/18/2023   CL 107 06/18/2023   CO2 23 06/18/2023    Lab Results  Component Value Date   ALT 29 06/18/2023   AST 23 06/18/2023   ALKPHOS 64 06/18/2023   BILITOT 0.7 06/18/2023        Danelle Earthly, MD [facility] 06/18/2023, 10:43 PM

## 2023-06-18 NOTE — Progress Notes (Signed)
 [facility] PROGRESS NOTE   Grant Bowers VHQ:[id number] DOB: 1945/05/25 DOA: 06/14/2023  PCP: [name], MD  Brief History: 78 y.o. male with medical history significant of  type 2 diabetes mellitus, essential hypertension, mixed hyperlipidemia, low back pain (on oxycodone at home.), trifascicular block s/p PPM,  OSA on CPAP, GERD who presented to the ED emergency department due to worsening low back pain.  Imaging studies raised concern for discitis.  He was hospitalized for further management.    Consultants: Phone discussion with neurosurgery.  Infectious disease, cardiology/EP  Procedures:   TEE - No vegetations.    TTE - 1. Left ventricular ejection fraction, by estimation, is 55 to 60%. The left ventricle has normal function.  2. Right ventricular systolic function is normal. The right ventricular size is normal.  3. A small pericardial effusion is present. The pericardial effusion is circumferential.  4. Mild mitral valve regurgitation. No evidence of mitral stenosis.  5. The aortic valve is tricuspid. There is mild calcification of the aortic valve. There is mild thickening of the aortic valve.  6. The inferior vena cava is normal in size with greater than 50% respiratory variability, suggesting right atrial pressure of 3 mmHg.  7. Limited echo     Subjective/Interval History:  Patient in bed, appears comfortable, denies any headache, no fever, no chest pain or pressure, no shortness of breath , no abdominal pain. No new focal weakness.  Improved low back pain.     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 Case was discussed with neurosurgery who recommended MRI.  Patient has a pacemaker so he was transferred to [facility]. Informed by MRI tech that due to the type of pacemaker that he has along  with the position of the leads he is unfortunately unable to get MRI safely.,  Per neurosurgery no further imaging required. In by ID on IV antibiotics, source likely to oral dentition for which she will require outpatient dental follow-up, TTE and TEE to rule out vegetations.  Continue him on supportive care with antibiotics, pain control, PT OT, case discussed with ID, repeat blood cultures on 06/18/2023.      Hyponatremia Stable to improved.  Hypokalemia Will be supplemented.  Magnesium was 2.0 yesterday.  Normocytic anemia No evidence of overt bleeding.  QT prolongation Correct electrolytes.  Repeat EKG showed improvement in QT prolongation.  Essential hypertension Noted to be on amlodipine and irbesartan.  Blood pressure is reasonably well-controlled.  History of BPH Continue alfuzosin.  History of hyperlipidemia Statin.  Recently diagnosed cholelithiasis Outpatient follow-up with general surgery.  Diabetes mellitus type 2 with hyperglycemia Metformin on hold.  Continue SSI.  HbA1c is 6.2.  Lab Results  Component Value Date   HGBA1C 6.2 04/17/2023   CBG (last 3)  Recent Labs    06/17/23 1232 06/17/23 1528 06/17/23 2121  GLUCAP 188* 142* 217*    DVT Prophylaxis: SCDs for now Code Status: Full code Family Communication: Discussed with patient and his wife bedside on 06/18/2023 Disposition Plan: To be determined  Status is: Inpatient Remains inpatient appropriate because: Need for IV antibiotics      Medications: Scheduled:  [MAR Hold] alfuzosin  10 mg Oral QHS   [MAR Hold] amLODipine  5 mg Oral Daily   [MAR Hold] feeding supplement (GLUCERNA SHAKE)  237 mL Oral TID BM   [MAR Hold] insulin  aspart  0-9 Units Subcutaneous TID WC   [MAR Hold] irbesartan  75 mg Oral Daily   [MAR Hold] lidocaine  2 patch Transdermal Q24H   [MAR Hold] LORazepam  1 mg Intravenous Once   [MAR Hold] polyethylene glycol  17 g Oral Daily   [MAR Hold] rosuvastatin  20 mg Oral  Daily   [MAR Hold] senna-docusate  2 tablet Oral BID   [MAR Hold] sodium chloride flush  3-10 mL Intravenous Q12H   Continuous:  [MAR Hold] cefTRIAXone (ROCEPHIN)  IV Stopped (06/17/23 2000)   PRN:[MAR Hold] acetaminophen **OR** [MAR Hold] acetaminophen, [MAR Hold]  HYDROmorphone (DILAUDID) injection, [MAR Hold] oxyCODONE, [MAR Hold] prochlorperazine, [MAR Hold] sodium chloride flush  Objective:  Vital Signs  Vitals:   06/18/23 0332 06/18/23 0739 06/18/23 0821 06/18/23 0830  BP: (!) 125/59 (!) 148/81 (!) 83/56 (!) 115/44  Pulse: (!) 103 89 87 97  Resp: 18 15 20  (!) 23  Temp: 98 F (36.7 C) 98.2 F (36.8 C) 98.1 F (36.7 C)   TempSrc: Oral Temporal Temporal   SpO2: 94% 95% 96% 96%  Weight:      Height:        Intake/Output Summary (Last 24 hours) at 06/18/2023 0856 Last data filed at 06/18/2023 1610 Gross per 24 hour  Intake 563 ml  Output 400 ml  Net 163 ml   Filed Weights   06/14/23 1645  Weight: 80.4 kg    Exam  Awake Alert, No new F.N deficits, bilateral plantars downgoing [place].AT,PERRAL Supple Neck, No JVD,   Symmetrical Chest wall movement, Good air movement bilaterally, CTAB RRR,No Gallops, Rubs or new Murmurs,  +ve B.Sounds, Abd Soft, No tenderness,   No Cyanosis, Clubbing or edema     Lab Results:   Data Review:   Inpatient Medications  Scheduled Meds:  [MAR Hold] alfuzosin  10 mg Oral QHS   [MAR Hold] amLODipine  5 mg Oral Daily   [MAR Hold] feeding supplement (GLUCERNA SHAKE)  237 mL Oral TID BM   [MAR Hold] insulin aspart  0-9 Units Subcutaneous TID WC   [MAR Hold] irbesartan  75 mg Oral Daily   [MAR Hold] lidocaine  2 patch Transdermal Q24H   [MAR Hold] LORazepam  1 mg Intravenous Once   [MAR Hold] polyethylene glycol  17 g Oral Daily   [MAR Hold] rosuvastatin  20 mg Oral Daily   [MAR Hold] senna-docusate  2 tablet Oral BID   [MAR Hold] sodium chloride flush  3-10 mL Intravenous Q12H   Continuous Infusions:  [MAR Hold] cefTRIAXone  (ROCEPHIN)  IV Stopped (06/17/23 2000)   PRN Meds:.[MAR Hold] acetaminophen **OR** [MAR Hold] acetaminophen, [MAR Hold]  HYDROmorphone (DILAUDID) injection, [MAR Hold] oxyCODONE, [MAR Hold] prochlorperazine, [MAR Hold] sodium chloride flush  DVT Prophylaxis  SCDs Start: 06/15/23 0359   Recent Labs  Lab 06/14/23 1725 06/15/23 0428 06/16/23 0554 06/17/23 0559 06/18/23 0531  WBC 15.2* 12.5* 10.6* 9.3 9.9  HGB 11.1* 9.7* 9.8* 9.0* 8.8*  HCT 33.9* 30.9* 30.1* 28.3* 27.3*  PLT 334 272 274 275 285  MCV 81.5 83.5 82.5 83.0 82.7  MCH 26.7 26.2 26.8 26.4 26.7  MCHC 32.7 31.4 32.6 31.8 32.2  RDW 14.7 14.7 14.7 14.7 14.8  LYMPHSABS 0.3*  --   --  0.9 1.0  MONOABS 0.7  --   --  0.7 0.7  EOSABS 0.0  --   --  0.1 0.1  BASOSABS 0.0  --   --  0.0 0.0    Recent  Labs  Lab 06/14/23 1725 06/14/23 1907 06/15/23 0428 06/15/23 0756 06/16/23 0554 06/17/23 0559 06/18/23 0531  NA 127*  --  132*  --  133* 135 136  K 4.0  --  3.8  --  3.2* 3.9 4.3  CL 97*  --  102  --  102 103 107  CO2 17*  --  23  --  21* 22 23  ANIONGAP 13  --  7  --  10 10 6   GLUCOSE 287*  --  179*  --  158* 177* 156*  BUN 22  --  25*  --  22 17 15   CREATININE 1.44*  --  1.11  --  1.01 0.91 1.02  AST 20  --  14*  --  25 19 23   ALT 21  --  20  --  25 24 29   ALKPHOS 84  --  69  --  74 70 64  BILITOT 1.2  --  0.8  --  0.7 0.8 0.7  ALBUMIN 2.5*  --  2.3*  --  2.2* 2.1* 2.1*  CRP  --   --   --   --   --  8.4* 9.3*  PROCALCITON  --   --   --   --   --  0.71 0.35  LATICACIDVEN 3.1* 1.6  --  0.8  --   --   --   BNP  --   --   --   --   --  200.6* 191.4*  MG  --   --  2.0  --   --  2.1 2.0  PHOS  --   --  3.8  --   --   --  3.2  CALCIUM 8.4*  --  8.1*  --  8.2* 8.2* 8.3*      Recent Labs  Lab 06/14/23 1725 06/14/23 1907 06/15/23 0428 06/15/23 0756 06/16/23 0554 06/17/23 0559 06/18/23 0531  CRP  --   --   --   --   --  8.4* 9.3*  PROCALCITON  --   --   --   --   --  0.71 0.35  LATICACIDVEN 3.1* 1.6  --  0.8  --    --   --   BNP  --   --   --   --   --  200.6* 191.4*  MG  --   --  2.0  --   --  2.1 2.0  CALCIUM 8.4*  --  8.1*  --  8.2* 8.2* 8.3*    --------------------------------------------------------------------------------------------------------------- Lab Results  Component Value Date   CHOL 96 (L) 05/03/2023   HDL 30 (L) 05/03/2023   LDLCALC 49 05/03/2023   LDLDIRECT 150.3 02/11/2010   TRIG 82 05/03/2023   CHOLHDL 3.2 05/03/2023    Lab Results  Component Value Date   HGBA1C 6.2 04/17/2023   Radiology Reports  EP STUDY Result Date: 06/18/2023 See surgical note for result.  ECHOCARDIOGRAM LIMITED Result Date: 06/17/2023    ECHOCARDIOGRAM LIMITED REPORT   Patient Name:   Grant Bowers Date of Exam: 06/17/2023 Medical Rec #:  [id number]     Height:       72.0 in Accession #:    [id number]    Weight:       177.2 lb Date of Birth:  May 07, 1945    BSA:          2.024 m Patient Age:    65 years  BP:           132/64 mmHg Patient Gender: M             HR:           110 bpm. Exam Location:  Inpatient Procedure: Limited Echo, Cardiac Doppler and Color Doppler (Both Spectral and            Color Flow Doppler were utilized during procedure). Indications:    endocarditis  History:        Patient has prior history of Echocardiogram examinations, most                 recent 05/14/2023. Pacemaker, chronic kidney disease; Risk                 Factors:Hypertension, Dyslipidemia, Diabetes and Sleep Apnea.  Sonographer:    Delcie Roch RDCS Referring Phys: [id number] [facility] IMPRESSIONS  1. Left ventricular ejection fraction, by estimation, is 55 to 60%. The left ventricle has normal function.  2. Right ventricular systolic function is normal. The right ventricular size is normal.  3. A small pericardial effusion is present. The pericardial effusion is circumferential.  4. Mild mitral valve regurgitation. No evidence of mitral stenosis.  5. The aortic valve is tricuspid. There is mild calcification of the  aortic valve. There is mild thickening of the aortic valve.  6. The inferior vena cava is normal in size with greater than 50% respiratory variability, suggesting right atrial pressure of 3 mmHg.  7. Limited echo FINDINGS  Left Ventricle: Left ventricular ejection fraction, by estimation, is 55 to 60%. The left ventricle has normal function. Right Ventricle: The right ventricular size is normal. Right vetricular wall thickness was not well visualized. Right ventricular systolic function is normal. Pericardium: A small pericardial effusion is present. The pericardial effusion is circumferential. Mitral Valve: Mild mitral valve regurgitation. No evidence of mitral valve stenosis. Tricuspid Valve: The tricuspid valve is normal in structure. Tricuspid valve regurgitation is not demonstrated. No evidence of tricuspid stenosis. Aortic Valve: The aortic valve is tricuspid. There is mild calcification of the aortic valve. There is mild thickening of the aortic valve. There is mild aortic valve annular calcification. Venous: The inferior vena cava is normal in size with greater than 50% respiratory variability, suggesting right atrial pressure of 3 mmHg. Additional Comments: A device lead is visualized in the right atrium and right ventricle. Spectral Doppler performed. Color Doppler performed.  IVC IVC diam: 1.70 cm MR Peak grad: 119.7 mmHg MR Mean grad: 71.0 mmHg MR Vmax:      547.00 cm/s MR Vmean:     397.0 cm/s Dina Rich MD Electronically signed by Dina Rich MD Signature Date/Time: 06/17/2023/7:39:57 PM    Final    DG Orthopantogram Result Date: 06/17/2023 CLINICAL DATA:  [id number]. Abscess. EXAM: ORTHOPANTOGRAM/PANORAMIC COMPARISON:  None. FINDINGS: A single view is provided. There are multiple upper and lower metallic crowns and fillings. There is a metallic crown in the expected location of the right upper front lateral incisor but the root structure is not well seen. This could be on a technical basis, motion  artifact or carious involvement of the root structure. There appears to have been some degree of motion at the level of the upper and lower incisors. The 12 year and 6 year molars of the right hemimandible demonstrate severe decay with only the roots remaining of the 6 year molar. The 12 year molar still has a portion of the crown intact but  most of it is decayed. Bone recession has occurred along the alveolar process adjacent the right mandibular 6 and 12 year molars and is also somewhat noted along the mandibular incisors and bicuspids. There is slight periapical lucency noted underlying the roots of the right mandibular decayed molars and also underlying the right mandibular first bicuspid and the left mandibular second bicuspid. The left mandibular 6 year molar has been previously removed with medial tilting of the left mandibular 12 year molar. The upper teeth are not quite as well seen due to motion artifacts but except as above they appear grossly intact without obvious periapical lucency. Both TMJs are normally located. IMPRESSION: 1. Multiple upper and lower metallic crowns and fillings. 2. The root structure of the right upper front lateral incisor is not well seen. This could be on a technical basis, motion artifact or carious involvement of the root structure. 3. Severe decay of the right mandibular 6 year and 12 year molars with bone erosion along the alveolar process adjacent. 4. Slight periapical lucency underlying the roots of the right mandibular 6 year molar and also underlying the right mandibular first bicuspid and the left mandibular second bicuspid. 5. The left mandibular 6 year molar has been previously removed with medial tilting of the left mandibular 12 year molar. 6. The upper teeth are not as well seen due to motion, but except as above appear grossly intact. Electronically Signed   By: Almira Bar M.D.   On: 06/17/2023 03:20   DG HIP UNILAT WITH PELVIS 2-3 VIEWS RIGHT Result Date:  06/16/2023 CLINICAL DATA:  [id number].  Infection. Right hip pain. EXAM: DG HIP (WITH OR WITHOUT PELVIS) 2-3V RIGHT COMPARISON:  AP pelvis and right hip views 06/04/2023 FINDINGS: There is no evidence of hip fracture or dislocation. There is no evidence of arthropathy or other focal bone abnormality. There are patchy calcific plaques in the right superficial femoral artery with otherwise unremarkable soft tissues. Compare: Unchanged. IMPRESSION: 1. No evidence of fracture or dislocation. 2. Atherosclerosis. Electronically Signed   By: Almira Bar M.D.   On: 06/16/2023 22:06      Signature  -   Susa Raring M.D on 06/18/2023 at 8:59 AM   -  To page go to [email or web address]

## 2023-06-18 NOTE — Consult Note (Signed)
 ELECTROPHYSIOLOGY CONSULT NOTE    Patient ID: Grant Bowers MRN: [id number], DOB/AGE: 05-11-1945 78 y.o.  Admit date: 06/14/2023 Date of Consult: 06/18/2023  Primary Physician: [name], MD Primary Cardiologist: Marjo Bicker, MD  Electrophysiologist: Dr. Nelly Laurence     Patient Profile: Grant Bowers is a 78 y.o. male with a history of T2DM, HTN, low back pain, trifascicular block s/p PPM, OSA on CPAP, GERD who is being seen today for the evaluation of bacteremia with PPM at the request of Dr. Thedore Mins.  HPI:  Grant Bowers is a 78 y.o. male with PMH as above who presented to [facility] with increased low back pain found to have discitis. Blood cultures 2/2 with strep mitis/oralis.  ID has been consulted. EP consulted for bactermia in setting of transvenous device.   He was treated for acute pericarditis in January with colchicine. He was seen in clinic a few weeks afterwards with stable TTE and slightly improving chest discomfort though continued to have profound fatigue. He presented to [facility] 3/6 on recommendation from pain clinic. He reported fevers up to 105F at home. CT abd/pel is concerning for vertebral osteomyelitis.  Blood cultures positive for strep mitis/oralis. He had TEE this AM without vegetation on PPM leads.   He continues to have significant R hip pain, states he is barely able to move a few inches in bed.   He denies chest pain, palpitations, dyspnea, PND, orthopnea, nausea, vomiting, dizziness, syncope, edema, weight gain, or early satiety.   Labs Potassium4.3 (03/10 0531) Magnesium  2.0 (03/10 0531) Creatinine, ser  1.02 (03/10 0531) PLT  285 (03/10 0531) HGB  8.8* (03/10 0531) WBC 9.9 (03/10 0531)  .    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   [facility];   followed by EP cardiology--- dr Graciela Husbands   Diabetes mellitus without  complication (HCC)    Dyspnea due to COVID-19    with exertion   ED (erectile dysfunction)    GERD (gastroesophageal reflux disease)    History of 2019 novel coronavirus disease (COVID-19) 05/07/2020   per pt mild symptoms all resolved with exception DOE   History of cardiac murmur as a child    History of kidney stones    Hyperlipidemia    Hypertension    followed by pcp and cardiology   Hypogonadism male    Mild atherosclerosis of both carotid arteries 09/23/2015   duplex doppler in epic bilateral ICA 1-39%   OSA on CPAP    per pt uses nightly   Post-COVID chronic dyspnea    had covid 05-07-2020;  per pt never had issue prior to covid ;  PFT 10-19-2020 normal per dr c. young   Right ureteral calculus 02/11/2010   Skin cancer    Calf   Trifascicular block    alternating with BBB per dr Graciela Husbands note;  s/p PPM 07-09-2020;   echo 07-29-2020 mild LVH, G1DD, ef 65-70%, moderate LAE, mild MR without stenosis     Surgical History:  Past Surgical History:  Procedure Laterality Date   CARDIAC CATHETERIZATION  2009   per pt in [place] , told was normal   CARDIAC CATHETERIZATION  02/17/2010   @[facility]   by dr Clifton James--- no angiographic evidence cad, normal lvsf,  false positive stress test   COLONOSCOPY  12/07/2006   [facility] in [place]  CYSTOSCOPY WITH RETROGRADE PYELOGRAM, URETEROSCOPY AND STENT PLACEMENT Right 10/29/2020   Procedure: CYSTOSCOPY WITH RETROGRADE PYELOGRAM, URETEROSCOPY AND STENT PLACEMENT;  Surgeon: Marcine Matar, MD;  Location: [facility];  Service: Urology;  Laterality: Right;   CYSTOSCOPY WITH RETROGRADE PYELOGRAM, URETEROSCOPY AND STENT PLACEMENT Right 11/09/2020   Procedure: CYSTOSCOPY WITH RIGHT  RETROGRADE PYELOGRAM, URETEROSCOPY AND STENT EXHANGE;  Surgeon: Belva Agee, MD;  Location: [facility];  Service: Urology;  Laterality: Right;  30 MINS   CYSTOSCOPY WITH URETEROSCOPY, STONE BASKETRY AND STENT PLACEMENT  2012    CYSTOSCOPY/URETEROSCOPY/HOLMIUM LASER/STENT PLACEMENT Right 12/20/2021   Procedure: CYSTOSCOPY/URETEROSCOPY/HOLMIUM LASER/STENT PLACEMENT;  Surgeon: Belva Agee, MD;  Location: [facility];  Service: Urology;  Laterality: Right;  45 MINS   EXTRACORPOREAL SHOCK WAVE LITHOTRIPSY Right 04/29/2020   Procedure: EXTRACORPOREAL SHOCK WAVE LITHOTRIPSY (ESWL);  Surgeon: Noel Christmas, MD;  Location: [facility];  Service: Urology;  Laterality: Right;   HOLMIUM LASER APPLICATION Right 11/09/2020   Procedure: HOLMIUM LASER APPLICATION;  Surgeon: Belva Agee, MD;  Location: [facility];  Service: Urology;  Laterality: Right;   KNEE ARTHROSCOPY Left 2008   PACEMAKER IMPLANT N/A 07/09/2020   Procedure: PACEMAKER IMPLANT;  Surgeon: Duke Salvia, MD;  Location: [name] INVASIVE CV [facility];  Service: Cardiovascular;  Laterality: N/A;     Medications Prior to Admission  Medication Sig Dispense Refill Last Dose/Taking   acetaminophen (TYLENOL) 325 MG tablet Take 2 tablets (650 mg total) by mouth every 6 (six) hours as needed for mild pain (pain score 1-3) (or Fever >/= 101). (Patient taking differently: Take 1,000 mg by mouth every 6 (six) hours as needed for mild pain (pain score 1-3) (or Fever >/= 101).)   06/14/2023 Noon   alfuzosin (UROXATRAL) 10 MG 24 hr tablet Take 10 mg by mouth at bedtime.   06/13/2023 Bedtime   amLODipine (NORVASC) 10 MG tablet Take 1 tablet (10 mg total) by mouth daily. 90 tablet 3 06/14/2023   aspirin EC 81 MG tablet Take 1 tablet (81 mg total) by mouth daily with breakfast. 30 tablet 2 06/14/2023 at 10:00 AM   Cholecalciferol (VITAMIN D3 SUPER STRENGTH) 50 MCG (2000 UT) TABS Take 2,000 Units by mouth daily.   06/14/2023 Morning   hydrochlorothiazide (MICROZIDE) 12.5 MG capsule Take 1 capsule (12.5 mg total) by mouth daily. 90 capsule 3 06/14/2023 Morning   irbesartan (AVAPRO) 300 MG tablet Take 1 tablet (300 mg total) by mouth daily. 90 tablet 3 06/14/2023 Morning    metFORMIN (GLUCOPHAGE-XR) 500 MG 24 hr tablet TAKE 1 TABLET EVERY DAY WITH BREAKFAST 90 tablet 3 06/14/2023 Morning   methocarbamol (ROBAXIN) 500 MG tablet Take 1 tablet (500 mg total) by mouth 3 (three) times daily. 90 tablet 3 06/14/2023 Morning   mirtazapine (REMERON) 15 MG tablet Take 1 tablet (15 mg total) by mouth at bedtime. 30 tablet 3 06/13/2023 Bedtime   oxyCODONE (ROXICODONE) 5 MG immediate release tablet Take 1 tablet (5 mg total) by mouth every 6 (six) hours as needed for severe pain (pain score 7-10). 30 tablet 0 06/14/2023 Noon   pantoprazole (PROTONIX) 40 MG tablet Take 1 tablet (40 mg total) by mouth daily. 30 tablet 1 06/14/2023 Morning   polyethylene glycol (MIRALAX / GLYCOLAX) 17 g packet Take 17 g by mouth 2 (two) times daily. 60 each 3 06/13/2023 Bedtime   rosuvastatin (CRESTOR) 20 MG tablet TAKE 1 TABLET EVERY DAY 90 tablet 3 06/14/2023 Morning   senna-docusate (SENOKOT-S) 8.6-50 MG  tablet Take 2 tablets by mouth at bedtime. 60 tablet 3 06/13/2023 Bedtime   tolterodine (DETROL LA) 2 MG 24 hr capsule Take 2 mg by mouth daily.   06/13/2023 Bedtime   zinc gluconate 50 MG tablet Take 50 mg by mouth daily.   Past Week   Alcohol Swabs (B-D SINGLE USE SWABS REGULAR) PADS Use as directed once per day E11.9 100 each 12    Blood Glucose Calibration (TRUE METRIX LEVEL 1) Low SOLN Use as directed once per day E11.9 1 each 1    Blood Glucose Monitoring Suppl (TRUE METRIX METER) DEVI USE AS DIRECTED TO test blood sugar EVERY DAY      glucose blood (TRUE METRIX BLOOD GLUCOSE TEST) test strip TEST BLOOD SUGAR TWO TIMES DAILY AS INSTRUCTED 200 strip 3    TRUEplus Lancets 33G MISC Use as directed once per day E11.9 100 each 12     Inpatient Medications:   [MAR Hold] alfuzosin  10 mg Oral QHS   [MAR Hold] amLODipine  5 mg Oral Daily   [MAR Hold] feeding supplement (GLUCERNA SHAKE)  237 mL Oral TID BM   heparin injection (subcutaneous)  5,000 Units Subcutaneous Q8H   [MAR Hold] insulin aspart  0-9 Units  Subcutaneous TID WC   [MAR Hold] irbesartan  75 mg Oral Daily   [MAR Hold] lidocaine  2 patch Transdermal Q24H   [MAR Hold] LORazepam  1 mg Intravenous Once   [MAR Hold] polyethylene glycol  17 g Oral Daily   [MAR Hold] rosuvastatin  20 mg Oral Daily   [MAR Hold] senna-docusate  2 tablet Oral BID   [MAR Hold] sodium chloride flush  3-10 mL Intravenous Q12H    Allergies: No Known Allergies  Family History  Problem Relation Age of Onset   Pancreatic cancer Mother    Diabetes Father    Diabetes Brother    Colon polyps Brother    Colon cancer Neg Hx    Esophageal cancer Neg Hx    Rectal cancer Neg Hx    Stomach cancer Neg Hx      Physical Exam: Vitals:   06/18/23 0332 06/18/23 0739 06/18/23 0821 06/18/23 0830  BP: (!) 125/59 (!) 148/81 (!) 83/56 (!) 115/44  Pulse: (!) 103 89 87 97  Resp: 18 15 20  (!) 23  Temp: 98 F (36.7 C) 98.2 F (36.8 C) 98.1 F (36.7 C)   TempSrc: Oral Temporal Temporal   SpO2: 94% 95% 96% 96%  Weight:      Height:        GEN- NAD, A&O x 3, normal affect HEENT: Normocephalic, atraumatic Lungs- CTAB, Normal effort.  Heart- Regular rate and rhythm, No M/G/R.  GI- Soft, NT, ND.  Extremities- No clubbing, cyanosis, or edema   Radiology/Studies: EP STUDY Result Date: 06/18/2023 See surgical note for result.  ECHOCARDIOGRAM LIMITED Result Date: 06/17/2023    ECHOCARDIOGRAM LIMITED REPORT   Patient Name:   WANE MOLLETT Date of Exam: 06/17/2023 Medical Rec #:  [id number]     Height:       72.0 in Accession #:    [id number]    Weight:       177.2 lb Date of Birth:  February 03, 1946    BSA:          2.024 m Patient Age:    77 years      BP:           132/64 mmHg Patient Gender: M  HR:           110 bpm. Exam Location:  Inpatient Procedure: Limited Echo, Cardiac Doppler and Color Doppler (Both Spectral and            Color Flow Doppler were utilized during procedure). Indications:    endocarditis  History:        Patient has prior history of Echocardiogram  examinations, most                 recent 05/14/2023. Pacemaker, chronic kidney disease; Risk                 Factors:Hypertension, Dyslipidemia, Diabetes and Sleep Apnea.  Sonographer:    Delcie Roch RDCS Referring Phys: [id number] [facility] IMPRESSIONS  1. Left ventricular ejection fraction, by estimation, is 55 to 60%. The left ventricle has normal function.  2. Right ventricular systolic function is normal. The right ventricular size is normal.  3. A small pericardial effusion is present. The pericardial effusion is circumferential.  4. Mild mitral valve regurgitation. No evidence of mitral stenosis.  5. The aortic valve is tricuspid. There is mild calcification of the aortic valve. There is mild thickening of the aortic valve.  6. The inferior vena cava is normal in size with greater than 50% respiratory variability, suggesting right atrial pressure of 3 mmHg.  7. Limited echo FINDINGS  Left Ventricle: Left ventricular ejection fraction, by estimation, is 55 to 60%. The left ventricle has normal function. Right Ventricle: The right ventricular size is normal. Right vetricular wall thickness was not well visualized. Right ventricular systolic function is normal. Pericardium: A small pericardial effusion is present. The pericardial effusion is circumferential. Mitral Valve: Mild mitral valve regurgitation. No evidence of mitral valve stenosis. Tricuspid Valve: The tricuspid valve is normal in structure. Tricuspid valve regurgitation is not demonstrated. No evidence of tricuspid stenosis. Aortic Valve: The aortic valve is tricuspid. There is mild calcification of the aortic valve. There is mild thickening of the aortic valve. There is mild aortic valve annular calcification. Venous: The inferior vena cava is normal in size with greater than 50% respiratory variability, suggesting right atrial pressure of 3 mmHg. Additional Comments: A device lead is visualized in the right atrium and right ventricle. Spectral  Doppler performed. Color Doppler performed.  IVC IVC diam: 1.70 cm MR Peak grad: 119.7 mmHg MR Mean grad: 71.0 mmHg MR Vmax:      547.00 cm/s MR Vmean:     397.0 cm/s Dina Rich MD Electronically signed by Dina Rich MD Signature Date/Time: 06/17/2023/7:39:57 PM    Final    DG Orthopantogram Result Date: 06/17/2023 CLINICAL DATA:  11914. Abscess. EXAM: ORTHOPANTOGRAM/PANORAMIC COMPARISON:  None. FINDINGS: A single view is provided. There are multiple upper and lower metallic crowns and fillings. There is a metallic crown in the expected location of the right upper front lateral incisor but the root structure is not well seen. This could be on a technical basis, motion artifact or carious involvement of the root structure. There appears to have been some degree of motion at the level of the upper and lower incisors. The 12 year and 6 year molars of the right hemimandible demonstrate severe decay with only the roots remaining of the 6 year molar. The 12 year molar still has a portion of the crown intact but most of it is decayed. Bone recession has occurred along the alveolar process adjacent the right mandibular 6 and 12 year molars and is also somewhat noted along  the mandibular incisors and bicuspids. There is slight periapical lucency noted underlying the roots of the right mandibular decayed molars and also underlying the right mandibular first bicuspid and the left mandibular second bicuspid. The left mandibular 6 year molar has been previously removed with medial tilting of the left mandibular 12 year molar. The upper teeth are not quite as well seen due to motion artifacts but except as above they appear grossly intact without obvious periapical lucency. Both TMJs are normally located. IMPRESSION: 1. Multiple upper and lower metallic crowns and fillings. 2. The root structure of the right upper front lateral incisor is not well seen. This could be on a technical basis, motion artifact or carious  involvement of the root structure. 3. Severe decay of the right mandibular 6 year and 12 year molars with bone erosion along the alveolar process adjacent. 4. Slight periapical lucency underlying the roots of the right mandibular 6 year molar and also underlying the right mandibular first bicuspid and the left mandibular second bicuspid. 5. The left mandibular 6 year molar has been previously removed with medial tilting of the left mandibular 12 year molar. 6. The upper teeth are not as well seen due to motion, but except as above appear grossly intact. Electronically Signed   By: Almira Bar M.D.   On: 06/17/2023 03:20   DG HIP UNILAT WITH PELVIS 2-3 VIEWS RIGHT Result Date: 06/16/2023 CLINICAL DATA:  [id number].  Infection. Right hip pain. EXAM: DG HIP (WITH OR WITHOUT PELVIS) 2-3V RIGHT COMPARISON:  AP pelvis and right hip views 06/04/2023 FINDINGS: There is no evidence of hip fracture or dislocation. There is no evidence of arthropathy or other focal bone abnormality. There are patchy calcific plaques in the right superficial femoral artery with otherwise unremarkable soft tissues. Compare: Unchanged. IMPRESSION: 1. No evidence of fracture or dislocation. 2. Atherosclerosis. Electronically Signed   By: Almira Bar M.D.   On: 06/16/2023 22:06   CT ABDOMEN PELVIS W CONTRAST Result Date: 06/14/2023 CLINICAL DATA:  Acute generalized abdominal pain. EXAM: CT ABDOMEN AND PELVIS WITH CONTRAST TECHNIQUE: Multidetector CT imaging of the abdomen and pelvis was performed using the standard protocol following bolus administration of intravenous contrast. RADIATION DOSE REDUCTION: This exam was performed according to the departmental dose-optimization program which includes automated exposure control, adjustment of the mA and/or kV according to patient size and/or use of iterative reconstruction technique. CONTRAST:  80mL OMNIPAQUE IOHEXOL 300 MG/ML  SOLN COMPARISON:  June 03, 2023.  December 15, 2021. FINDINGS:  Lower chest: Small pericardial effusion is noted. Minimal bilateral posterior basilar subsegmental atelectasis. Hepatobiliary: Small gallstone is noted with mild gallbladder distension. No biliary dilatation. Probable hepatic cysts are noted. Pancreas: Unremarkable. No pancreatic ductal dilatation or surrounding inflammatory changes. Spleen: Mild splenomegaly. Adrenals/Urinary Tract: Adrenal glands appear normal. Bilateral renal cysts are noted for which no further follow-up is required. No hydronephrosis or renal obstruction is noted. Urinary bladder is unremarkable. Stomach/Bowel: Stomach is within normal limits. Appendix appears normal. No evidence of bowel wall thickening, distention, or inflammatory changes. Vascular/Lymphatic: Aortic atherosclerosis. No enlarged abdominal or pelvic lymph nodes. Reproductive: Mild prostatic enlargement is again noted. Other: Small bilateral fat containing inguinal hernias are noted. No ascites is noted. Musculoskeletal: There appears to be irregular defect involving the posterior portion of the L4 vertebral body. IMPRESSION: Irregular defect is seen involving posterior portion of L4 vertebral body. Osteomyelitis cannot be excluded. MRI with and without gadolinium is recommended for further evaluation. Small pericardial effusion. Small gallstone with  gallbladder distention. Mild splenomegaly. Mild prostatic enlargement. Aortic Atherosclerosis (ICD10-I70.0). Electronically Signed   By: Lupita Raider M.D.   On: 06/14/2023 20:57   DG Chest Portable 1 View Result Date: 06/14/2023 CLINICAL DATA:  Fever. EXAM: PORTABLE CHEST 1 VIEW COMPARISON:  April 26, 2023 FINDINGS: There is stable dual lead AICD positioning. The heart size and mediastinal contours are within normal limits. Both lungs are clear. Multilevel degenerative changes seen throughout the thoracic spine. IMPRESSION: No active disease. Electronically Signed   By: Aram Candela M.D.   On: 06/14/2023 20:26   US  Carotid Bilateral Result Date: 06/05/2023 CLINICAL DATA:  Possible syncope.  Right-sided bruit. EXAM: BILATERAL CAROTID DUPLEX ULTRASOUND TECHNIQUE: Wallace Cullens scale imaging, color Doppler and duplex ultrasound were performed of bilateral carotid and vertebral arteries in the neck. COMPARISON:  None Available. FINDINGS: Criteria: Quantification of carotid stenosis is based on velocity parameters that correlate the residual internal carotid diameter with NASCET-based stenosis levels, using the diameter of the distal internal carotid lumen as the denominator for stenosis measurement. The following velocity measurements were obtained: RIGHT ICA: 164/16 cm/sec CCA: 74/11 cm/sec SYSTOLIC ICA/CCA RATIO:  2.9 ECA: 249 cm/sec LEFT ICA: 103/29 cm/sec CCA: 85/14 cm/sec SYSTOLIC ICA/CCA RATIO:  1.2 ECA: 161 cm/sec RIGHT CAROTID ARTERY: Echogenic plaque at the right carotid bulb. Elevated peak systolic velocity in the right external carotid artery. Echogenic plaque in the proximal internal carotid artery. Peak systolic velocity in the proximal internal carotid artery is 164 cm/sec. RIGHT VERTEBRAL ARTERY: Antegrade flow and normal waveform in the right vertebral artery. LEFT CAROTID ARTERY: Intimal thickening in the left common carotid artery. Small amount of echogenic plaque at the left carotid bulb. External carotid artery is patent with normal waveform. Normal velocities in the internal carotid artery. LEFT VERTEBRAL ARTERY: Antegrade flow and normal waveform in the left vertebral artery. IMPRESSION: 1. Atherosclerotic plaque at the right carotid bulb and right internal carotid artery with elevated peak systolic velocity in the proximal right internal carotid artery. 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 2. Mild atherosclerotic plaque in the left carotid arteries. Findings are compatible with less than 50% stenosis in the left internal carotid artery. 3. Antegrade flow in the vertebral  arteries. 4. Probable stenosis in the right external carotid artery. Electronically Signed   By: Richarda Overlie M.D.   On: 06/05/2023 10:59   DG HIP UNILAT W OR W/O PELVIS 2-3 VIEWS RIGHT Result Date: 06/04/2023 CLINICAL DATA:  Right hip pain, fall 2 weeks ago EXAM: DG HIP (WITH OR WITHOUT PELVIS) 2-3V RIGHT COMPARISON:  None Available. FINDINGS: There is no evidence of hip fracture or dislocation. There is no evidence of arthropathy or other focal bone abnormality. IMPRESSION: Negative. Electronically Signed   By: Charlett Nose M.D.   On: 06/04/2023 20:01   US Abdomen Limited Result Date: 06/04/2023 CLINICAL DATA:  Abdominal pain EXAM: ULTRASOUND ABDOMEN LIMITED RIGHT UPPER QUADRANT COMPARISON:  CT 06/03/2023. FINDINGS: Gallbladder: Dilated gallbladder. Dependent stones. Is also some non dependent echogenic areas which could resent small polyps measuring up to 4 mm. No adjacent fluid or wall thickening. No reported sonographic Murphy's sign. Common bile duct: Diameter: 4 mm Liver: No focal lesion identified. Within normal limits in parenchymal echogenicity. Portal vein is patent on color Doppler imaging with normal direction of blood flow towards the liver. Other: Trace right pleural fluid. Small right-sided renal cystic focus identified as seen on prior CT. IMPRESSION: Distended gallbladder with stones and small  polyps. No wall thickening, adjacent fluid or ductal dilatation. No additional sonographic evidence of acute cholecystitis. Trace right pleural fluid. Please correlate with prior CT Electronically Signed   By: Karen Kays M.D.   On: 06/04/2023 12:34   CT Angio Chest/Abd/Pel for Dissection W and/or Wo Contrast Result Date: 06/03/2023 CLINICAL DATA:  Head and neck trauma, low back pain and multiple frequent falls. Evaluating for acute aortic syndrome. EXAM: CT ANGIOGRAPHY CHEST, ABDOMEN AND PELVIS TECHNIQUE: A noncontrast chest CT study was initially performed to look for aortic hematoma. Multidetector  CT imaging through the chest, abdomen and pelvis was performed using the standard protocol during bolus administration of intravenous contrast. Multiplanar reconstructed images and MIPs were obtained and reviewed to evaluate the vascular anatomy. RADIATION DOSE REDUCTION: This exam was performed according to the departmental dose-optimization program which includes automated exposure control, adjustment of the mA and/or kV according to patient size and/or use of iterative reconstruction technique. CONTRAST:  80mL OMNIPAQUE IOHEXOL 350 MG/ML SOLN COMPARISON:  CTA chest and PA and lateral chest both 04/26/2023, CT abdomen pelvis without and with contrast 12/16/2018. FINDINGS: CTA CHEST FINDINGS Cardiovascular: There is metallic artifact from a left chest dual lead pacing system and wires in the right heart. The pulmonary arteries are normal in caliber, centrally clear on this nondedicated exam. There is preferential opacification of the aorta and great vessels. There is no aneurysm, dissection or stenosis. There are moderate to heavy aortic calcific plaques, occasional calcifications in the great vessels. There is a normal variant brachiobicarotid trunk and normal variant origin of the left vertebral artery from the aortic arch. The cardiac size is normal. There is interval increased pericardial effusion now 1 cm in thickness, previously 7 mm. Scattered calcific plaque left main, lad and circumflex coronary arteries. No venous dilatation. Mediastinum/Nodes: Few slightly frontal bilateral hilar nodes up to 1.1 cm in short axis. No mediastinal or axillary adenopathy. Thyroid gland is unremarkable. The thoracic trachea, thoracic esophagus, both main bronchi unremarkable. Lungs/Pleura: There are bilateral minimal layering pleural effusions, both slightly increased since 04/26/2023. Again noted linear scarring or atelectasis in both lung bases. Mild fissural atelectasis in the left upper lobe. There is no consolidation or  pneumothorax. No pulmonary nodules are seen. Musculoskeletal: Extensive bridging enthesopathy of the thoracic spine beginning at T4. Degenerative change and mild thoracic dextroscoliosis. No acute or other significant osseous findings.  No chest wall mass. Review of the MIP images confirms the above findings. CTA ABDOMEN AND PELVIS FINDINGS VASCULAR Aorta: Moderate to heavy aortoiliac calcific plaques. No stenosis. 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 Celiac: Patent without evidence of aneurysm, dissection, vasculitis or significant stenosis. Scattered branch calcific plaques splenic artery. No branch occlusions. Nonstenosing ostial calcific plaques. SMA: 50-60% calcific origin stenosis. The vessel otherwise opacifies well without visible branch occlusion. Minimal calcific plaque past the inflection point of the vessel. Renals: 2 roughly codominant arteries supplying each kidney. Both upper pole arteries demonstrate approximately 50% calcific origin stenosis. The right lower pole artery appears to have at least a 75 % calcific origin stenosis. The left lower pole artery is patent. IMA: Patent without evidence of aneurysm, dissection, vasculitis or significant stenosis. Inflow: Patent without evidence of aneurysm, dissection, vasculitis or significant stenosis. There are mild-to-moderate calcific plaques. Veins: No obvious venous abnormality within the limitations of this arterial phase study. Review of the MIP images confirms the above findings. NON-VASCULAR Hepatobiliary: Small scattered hepatic cysts and a few too small to characterize  hepatic hypodensities, unchanged. No follow-up imaging recommended. Largest cyst is 1.7 cm and 11 Hounsfield units in the dome of segment 8. Gallbladder dilated to nearly 11 cm. There is a subcentimeter stone in the proximal lumen. On some of the axial images there is suspicion of pericholecystic edema and slight gallbladder wall  thickening which may be seen with cholecystitis. Consider ultrasound follow-up for further study. No biliary dilatation. Pancreas: No abnormality. Spleen: Increased splenomegaly, the current AP splenic axis 17 cm previously 15.5 cm. No mass. Adrenals/Urinary Tract: There is no adrenal mass. Bilateral Bosniak 1 renal cysts are again seen, largest is a right parapelvic cyst measuring 4.6 cm and 15 Hounsfield units. No follow-up imaging is recommended. There is cortical thinning in both kidneys, scattered cortical scarring of the inferior poles. There previously was a 6 mm right ureteral stone which has passed. There is no hydronephrosis or appreciable urinary stones at present. No mass enhancement. The bladder is unremarkable. Stomach/Bowel: No dilatation or wall thickening including the appendix. Moderate fecal stasis. Sigmoid diverticulosis with no evidence of diverticulitis. Lymphatic: No appreciable adenopathy. Reproductive: 5.5 cm enlarged prostate. Scattered dystrophic prostatic calcifications. There is a mild posterior bladder impression. Right testicle appears to be retracted into the inguinal canal. Other: Small umbilical and inguinal fat hernias. No free hemorrhage, free fluid or free air, or localizing collections. Musculoskeletal: Degenerative change and slight levoscoliosis lumbar spine. Ankylosed SI joints. Mild hip DJD. No concerning regional bone lesion. Lumbar spine CT is dictated separately. Review of the MIP images confirms the above findings. IMPRESSION: 1. No acute trauma related findings in the chest, abdomen or pelvis. 2. Aortoiliac and coronary artery atherosclerosis. No dissection or aortic stenosis. 3. 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 Recommend surveillance ultrasound in 5 years. Reference: Journal of Vascular Surgery 67.1 (2018): 2-77. J Am Coll Radiol 2013;10:789-794. 4. 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 5. 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 6. Dilated gallbladder with stone and possible pericholecystic edema and slight wall thickening. Consider ultrasound follow-up to assess for cholecystitis. 7. Increased splenomegaly. 8. Constipation and diverticulosis. 9. Prostatomegaly with mild posterior bladder impression. Right testicle appears retracted into the inguinal canal. 10. Umbilical and inguinal fat hernias. 11. Increased pericardial effusion now 1 cm in thickness, previously 7 mm. 12. Slightly increased bilateral pleural effusions. 13. Critical Value/emergent results were called by telephone at the time of interpretation on 06/03/2023 at 11:29 pm to provider JOSEPH ZAMMIT , who verbally acknowledged these results. Aortic Atherosclerosis (ICD10-I70.0). Electronically Signed   By: Almira Bar M.D.   On: 06/03/2023 23:47   CT L-SPINE NO CHARGE Result Date: 06/03/2023 CLINICAL DATA:  Frequent falls, worsening back pain. EXAM: CT LUMBAR SPINE WITHOUT CONTRAST TECHNIQUE: Multidetector CT imaging of the lumbar spine was performed without intravenous contrast administration. Multiplanar CT image reconstructions were also generated. RADIATION DOSE REDUCTION: This exam was performed according to the departmental dose-optimization program which includes automated exposure control, adjustment of the mA and/or kV according to patient size and/or use of iterative reconstruction technique. COMPARISON:  CT abdomen and pelvis without and with contrast 12/15/2021 FINDINGS: Segmentation: 5 lumbar type vertebrae. Alignment: Chronic minimal discogenic degenerative grade 1 retrolisthesis L3-4, L4-5 and L5-S1. Slight levoscoliosis. No new, worsening or further alignment abnormality is seen. Vertebrae: Due to technique, the images are grainy with suboptimal spatial resolution. Accounting for this limitation no acute fracture is suspected or focal pathologic process. The vertebra are normal in heights.  There are prominent  marginal osteophytes, with attempted bridging anteriorly at L2-3 and to the right anteriorly at L4-5 and L5-S1. Paraspinal and other soft tissues: Heavy aortoiliac calcific plaques. 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 Multiple partially visible Bosniak 1 right renal cysts. No follow-up imaging recommended. No paraspinal hematoma, mass or fluid collection. Disc levels: T11-12: The disc is degenerated. Small anterior osteophytes. No herniation or stenosis. T12-L1: The disc is normal in height. There is no bulge, herniation or stenosis. Trace facet spurring. L1-2: The disc is normal in height. There is a minimal nonstenosing dorsal disc bulge without herniation. The foramina are clear. Trace facet spurring. L2-3: The disc is normal in height. There is a mild diffuse annular bulge without herniation, spinal canal or foraminal stenosis. Mild facet joint spurring. L3-4: Mild-to-moderate disc space loss. Anterior and posterior osteophytes are present. Broad-based posterior disc osteophyte complex mildly encroaches on the lateral recesses and descending L4 nerve roots. There is only slight spinal canal stenosis. Mild facet spurring with unilateral mild right foraminal stenosis. L4-5: Mild disc space loss. There is circumferential disc osteophyte complex. Right paracentral shallow disc protrusion is seen with likely mass effect on the descending right L5 nerve root. There is mild-to-moderate spinal canal stenosis with hypertrophic dorsal ligamentous calcification contributing. Facet spurring with bilateral mild foraminal stenosis. L5-S1: Mild disc space loss. Partial ossification across the posterior disc space. Posterior endplate osteophytes with posterior disc osteophyte complex again noted. Shallow central disc protrusion is seen, with disc osteophyte complex and protrusion causing mild mass effect on the S1 nerve roots. No significant foraminal narrowing and only mild facet spurring. There is ankylosis of both  anterior SI joints. The visualized sacrum is intact. Unremarkable visualized posterior pelvis. IMPRESSION: 1. Suboptimal image quality, with no convincing acute lumbar fracture or focal pathologic process. 2. Scoliosis and degenerative change with multilevel disc osteophyte complexes, and protrusions at the lowest 2 levels. 3. Right paracentral shallow disc protrusion at L4-5 with likely mass effect on the descending right L5 nerve root. 4. Shallow central disc protrusion at L5-S1 with disc osteophyte complex and protrusion causing mild mass effect on the S1 nerve roots. 5. Multilevel facet spurring with mild foraminal stenosis. 6. Aortoiliac atherosclerosis. 7. 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 Recommend follow-up ultrasound every 5 years. Reference: Journal of Vascular Surgery 67.1 (2018): 2-77. J Am Coll Radiol [phone]. Aortic Atherosclerosis (ICD10-I70.0). Electronically Signed   By: Almira Bar M.D.   On: 06/03/2023 23:03   CT Head Wo Contrast Result Date: 06/03/2023 CLINICAL DATA:  Falls EXAM: CT HEAD WITHOUT CONTRAST CT CERVICAL SPINE WITHOUT CONTRAST TECHNIQUE: Multidetector CT imaging of the head and cervical spine was performed following the standard protocol without intravenous contrast. Multiplanar CT image reconstructions of the cervical spine were also generated. RADIATION DOSE REDUCTION: This exam was performed according to the departmental dose-optimization program which includes automated exposure control, adjustment of the mA and/or kV according to patient size and/or use of iterative reconstruction technique. COMPARISON:  None Available. FINDINGS: CT HEAD FINDINGS Brain: No mass,hemorrhage or extra-axial collection. Normal appearance of the parenchyma and CSF spaces. Vascular: No hyperdense vessel or unexpected vascular calcification. Skull: The visualized skull base, calvarium and extracranial soft tissues are normal. Sinuses/Orbits: No fluid levels or advanced mucosal  thickening of the visualized paranasal sinuses. No mastoid or middle ear effusion. Normal orbits. Other: None. CT CERVICAL SPINE FINDINGS Alignment: No static subluxation. Facets are aligned. Occipital condyles are normally positioned. Skull base and vertebrae: No acute fracture. Soft  tissues and spinal canal: No prevertebral fluid or swelling. No visible canal hematoma. Disc levels: No advanced spinal canal or neural foraminal stenosis. Upper chest: No pneumothorax, pulmonary nodule or pleural effusion. Other: Normal visualized paraspinal cervical soft tissues. IMPRESSION: 1. No acute intracranial abnormality. 2. No acute fracture or static subluxation of the cervical spine. Electronically Signed   By: Deatra Robinson M.D.   On: 06/03/2023 22:45   CT Cervical Spine Wo Contrast Result Date: 06/03/2023 CLINICAL DATA:  Falls EXAM: CT HEAD WITHOUT CONTRAST CT CERVICAL SPINE WITHOUT CONTRAST TECHNIQUE: Multidetector CT imaging of the head and cervical spine was performed following the standard protocol without intravenous contrast. Multiplanar CT image reconstructions of the cervical spine were also generated. RADIATION DOSE REDUCTION: This exam was performed according to the departmental dose-optimization program which includes automated exposure control, adjustment of the mA and/or kV according to patient size and/or use of iterative reconstruction technique. COMPARISON:  None Available. FINDINGS: CT HEAD FINDINGS Brain: No mass,hemorrhage or extra-axial collection. Normal appearance of the parenchyma and CSF spaces. Vascular: No hyperdense vessel or unexpected vascular calcification. Skull: The visualized skull base, calvarium and extracranial soft tissues are normal. Sinuses/Orbits: No fluid levels or advanced mucosal thickening of the visualized paranasal sinuses. No mastoid or middle ear effusion. Normal orbits. Other: None. CT CERVICAL SPINE FINDINGS Alignment: No static subluxation. Facets are aligned.  Occipital condyles are normally positioned. Skull base and vertebrae: No acute fracture. Soft tissues and spinal canal: No prevertebral fluid or swelling. No visible canal hematoma. Disc levels: No advanced spinal canal or neural foraminal stenosis. Upper chest: No pneumothorax, pulmonary nodule or pleural effusion. Other: Normal visualized paraspinal cervical soft tissues. IMPRESSION: 1. No acute intracranial abnormality. 2. No acute fracture or static subluxation of the cervical spine. Electronically Signed   By: Deatra Robinson M.D.   On: 06/03/2023 22:45    EKG:SR with intermittent VP w 1st deg HB, rate 95 (personally reviewed)  TELEMETRY: SR- ST with PVCs, rate 90-110s (personally reviewed)  DEVICE HISTORY: St. 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 Adjusted settings to VVI 40 to better assess pacing need in future. Continue abx as per ID  #) chronic low back pain #) OSA on CPAP #) T2DM #) HTN Mgmt per primary service       For questions or updates, please contact [facility] Please consult [email or web address] for contact info under Cardiology/STEMI.  Signed, Sherie Don, NP  06/18/2023 9:06 AM

## 2023-06-18 NOTE — Progress Notes (Signed)
 Occupational Therapy Treatment Patient Details Name: Rodrigus Kilker MRN: [id number] DOB: 21-Apr-1945 Today's Date: 06/18/2023   History of present illness Pt is 78 yo presenting to [facility] ED on 3/6 due to increased pain in LB with concerns for discitis. 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 TEE 3/10. PMH: DM II, HTN, mixed hyperlipidemia, LBP, trifascicular block s/p PPM, OSA on CPAP, GERD.   OT comments  Pt premedicated for OT session via IV medication though continues to be limited by B hip pain (R > L). However, with cues for safety and adaptive strategies, pt able to mobilize to/from bathroom using RW with Min A though unsteadiness noted. Pt continues to require extensive assist for ADL mgmt due to pain limitations. Family present initially but did not stay for session. Recommend consideration of continued inpatient follow up therapy, <3 hours/day at DC due to high fall risk unless family confirms ability to provide physical assistance at home.       If plan is discharge home, recommend the following:  A lot of help with bathing/dressing/bathroom;Assistance with cooking/housework;Assist for transportation;Help with stairs or ramp for entrance;A little help with walking and/or transfers   Equipment Recommendations  BSC/3in1;Wheelchair (measurements OT);Wheelchair cushion (measurements OT);Other (comment) (RW)    Recommendations for Other Services      Precautions / Restrictions Precautions Precautions: Fall Restrictions Weight Bearing Restrictions Per Provider Order: No       Mobility Bed Mobility Overal bed mobility: Needs Assistance Bed Mobility: Rolling, Sidelying to Sit Rolling: Supervision Sidelying to sit: Mod assist, Used rails, HOB elevated       General bed mobility comments: able to roll to R side without assist, Cues for kicking LE off of bed and hand placement to lift trunk    Transfers Overall transfer level: Needs assistance Equipment used: Rolling walker (2  wheels) Transfers: Sit to/from Stand Sit to Stand: Min assist           General transfer comment: cues for shifting weight anteriorly to stand at bedside, able to pull on grab bar in bathroom to stand from [facility] over toilet     Balance Overall balance assessment: Needs assistance Sitting-balance support: No upper extremity supported, Feet supported Sitting balance-Leahy Scale: Fair     Standing balance support: Bilateral upper extremity supported, During functional activity, Reliant on assistive device for balance Standing balance-Leahy Scale: Poor                             ADL either performed or assessed with clinical judgement   ADL Overall ADL's : Needs assistance/impaired Eating/Feeding: Independent;Sitting                       Toilet Transfer: Minimal assistance;Ambulation;Rolling walker (2 wheels) Toilet Transfer Details (indicate cue type and reason): BSC over toilet. Min A to stand with cues for safety and strategies. Pt able to use RW to mobilize to/from bathroom though unsteady. With BSC over toilet, RW did not fit into bathroom so pt then able to place hands on therapist shoulders to step into bathroom and safely transfer onto toilet         Functional mobility during ADLs: Minimal assistance;Rolling walker (2 wheels) General ADL Comments: Limited by pain though pt reports pain has been an issue for 8 weeks w/ pt reporting MD told him to stay in bed for weeks d/t recently found bacteria that was being treated though pain did not  resolve and pt then also lost strength due to limited activity. Unclear how pt had been managing ADLs at home    Extremity/Trunk Assessment Upper Extremity Assessment Upper Extremity Assessment: Generalized weakness;Right hand dominant   Lower Extremity Assessment Lower Extremity Assessment: Defer to PT evaluation        Vision   Vision Assessment?: No apparent visual deficits   Perception     Praxis      Communication Communication Communication: No apparent difficulties   Cognition Arousal: Alert Behavior During Therapy: WFL for tasks assessed/performed, Anxious Cognition: No apparent impairments             OT - Cognition Comments: WFL, does benefit from cues for safety and problem solving strategies but likely distracted by pain levels                 Following commands: Intact        Cueing      Exercises      Shoulder Instructions       General Comments Family present at start of session but decided to leave prior to mobility initiation. OT encouraged family to stay    Pertinent Vitals/ Pain       Pain Assessment Pain Assessment: Faces Faces Pain Scale: Hurts whole lot Pain Location: B hips, low back Pain Descriptors / Indicators: Grimacing, Sharp, Sore, Shooting Pain Intervention(s): Monitored during session, Premedicated before session  Home Living                                          Prior Functioning/Environment              Frequency  Min 1X/week        Progress Toward Goals  OT Goals(current goals can now be found in the care plan section)  Progress towards OT goals: Progressing toward goals  Acute Rehab OT Goals Patient Stated Goal: pain control OT Goal Formulation: With patient Time For Goal Achievement: 07/01/23 Potential to Achieve Goals: Good ADL Goals Pt Will Perform Upper Body Dressing: with modified independence Pt Will Perform Lower Body Dressing: with modified independence Pt Will Transfer to Toilet: with modified independence Pt Will Perform Toileting - Clothing Manipulation and hygiene: with modified independence  Plan      Co-evaluation                 AM-PAC OT "6 Clicks" Daily Activity     Outcome Measure   Help from another person eating meals?: None Help from another person taking care of personal grooming?: A Little Help from another person toileting, which includes using  toliet, bedpan, or urinal?: A Lot Help from another person bathing (including washing, rinsing, drying)?: A Lot Help from another person to put on and taking off regular upper body clothing?: A Little Help from another person to put on and taking off regular lower body clothing?: A Lot 6 Click Score: 16    End of Session Equipment Utilized During Treatment: Gait belt;Rolling walker (2 wheels)  OT Visit Diagnosis: Unsteadiness on feet (R26.81);Other abnormalities of gait and mobility (R26.89);Muscle weakness (generalized) (M62.81);Pain Pain - part of body: Hip   Activity Tolerance Patient limited by pain   Patient Left in chair;with call bell/phone within reach;Other (comment) (NP at bedside with other team members to assess PPM)   Nurse Communication Mobility status  Time: 1610-9604 OT Time Calculation (min): 36 min  Charges: OT General Charges $OT Visit: 1 Visit OT Treatments $Self Care/Home Management : 8-22 mins $Therapeutic Activity: 8-22 mins  Bradd Canary, OTR/L Acute Rehab Services Office: [phone]   Lorre Munroe 06/18/2023, 10:55 AM

## 2023-06-18 NOTE — Plan of Care (Signed)

## 2023-06-18 NOTE — Anesthesia Preprocedure Evaluation (Signed)
 Anesthesia Evaluation  Patient identified by MRN, date of birth, ID band Patient awake    Reviewed: Allergy & Precautions, NPO status , Patient's Chart, lab work & pertinent test results  Airway Mallampati: II  TM Distance: >3 FB Neck ROM: Full    Dental no notable dental hx.    Pulmonary sleep apnea and Continuous Positive Airway Pressure Ventilation    Pulmonary exam normal        Cardiovascular hypertension, Pt. on medications Normal cardiovascular exam+ pacemaker      Neuro/Psych negative neurological ROS     GI/Hepatic Neg liver ROS,GERD  Medicated and Controlled,,  Endo/Other  diabetes, Oral Hypoglycemic Agents    Renal/GU negative Renal ROS     Musculoskeletal negative musculoskeletal ROS (+)    Abdominal   Peds  Hematology  (+) Blood dyscrasia, anemia   Anesthesia Other Findings bacteremia  Reproductive/Obstetrics                             Anesthesia Physical Anesthesia Plan  ASA: 3  Anesthesia Plan: MAC   Post-op Pain Management:    Induction:   PONV Risk Score and Plan: 1 and Propofol infusion and Treatment may vary due to age or medical condition  Airway Management Planned: Nasal Cannula  Additional Equipment:   Intra-op Plan:   Post-operative Plan:   Informed Consent: I have reviewed the patients History and Physical, chart, labs and discussed the procedure including the risks, benefits and alternatives for the proposed anesthesia with the patient or authorized representative who has indicated his/her understanding and acceptance.     Dental advisory given  Plan Discussed with: CRNA  Anesthesia Plan Comments:        Anesthesia Quick Evaluation

## 2023-06-18 NOTE — Transfer of Care (Signed)
 Immediate Anesthesia Transfer of Care Note  Patient: Grant Bowers  Procedure(s) Performed: TRANSESOPHAGEAL ECHOCARDIOGRAM  Patient Location: PACU and Cath Lab  Anesthesia Type:MAC  Level of Consciousness: awake, alert , and oriented  Airway & Oxygen Therapy: Patient Spontanous Breathing  Post-op Assessment: Report given to RN and Post -op Vital signs reviewed and stable  Post vital signs: Reviewed and stable  Last Vitals:  Vitals Value Taken Time  BP    Temp 36.7 C 06/18/23 0821  Pulse 82 06/18/23 0821  Resp 19 06/18/23 0821  SpO2 95 % 06/18/23 0821  Vitals shown include unfiled device data.  Last Pain:  Vitals:   06/18/23 0821  TempSrc: Temporal  PainSc: 0-No pain      Patients Stated Pain Goal: 0 (06/17/23 1109)  Complications: No notable events documented.

## 2023-06-18 NOTE — Progress Notes (Signed)
 OT Cancellation Note  Patient Details Name: Wayburn Shaler MRN: [id number] DOB: 1946-03-02   Cancelled Treatment:    Reason Eval/Treat Not Completed: Patient at procedure or test/ unavailable Off unit for TEE.   Lorre Munroe 06/18/2023, 8:30 AM

## 2023-06-18 NOTE — Progress Notes (Signed)
 PHARMACY CONSULT NOTE FOR:  OUTPATIENT  PARENTERAL ANTIBIOTIC THERAPY (OPAT)  Indication: Vertebral osteomyelitis  Regimen: Ceftriaxone 2 gm IV Q 24 hours  End date: 07/28/2023   IV antibiotic discharge orders are pended. To discharging provider:  please sign these orders via discharge navigator,  Select New Orders & click on the button choice - Manage This Unsigned Work.     Thank you for allowing pharmacy to be a part of this patient's care.  Sharin Mons, PharmD, BCPS, BCIDP Infectious Diseases Clinical Pharmacist Phone: [phone] 06/18/2023, 2:30 PM

## 2023-06-18 NOTE — Interval H&P Note (Signed)
 History and Physical Interval Note:  06/18/2023 7:39 AM  Grant Bowers  has presented today for surgery, with the diagnosis of bacteremia.  The various methods of treatment have been discussed with the patient and family. After consideration of risks, benefits and other options for treatment, the patient has consented to  Procedure(s): TRANSESOPHAGEAL ECHOCARDIOGRAM (N/A) as a surgical intervention.  The patient's history has been reviewed, patient examined, no change in status, stable for surgery.  I have reviewed the patient's chart and labs.  Questions were answered to the patient's satisfaction.     Edia Pursifull

## 2023-06-18 NOTE — Op Note (Addendum)
 INDICATIONS: bacteremia  PROCEDURE:   Informed consent was obtained prior to the procedure. The risks, benefits and alternatives for the procedure were discussed and the patient comprehended these risks.  Risks include, but are not limited to, cough, sore throat, vomiting, nausea, somnolence, esophageal and stomach trauma or perforation, bleeding, low blood pressure, aspiration, pneumonia, infection, trauma to the teeth and death.    After a procedural time-out, the oropharynx was anesthetized with 20% benzocaine spray.   During this procedure the patient was administered  IV propofol by Anesthesiology, Dr. Bradley Ferris.  The transesophageal probe was inserted in the esophagus and stomach without difficulty and multiple views were obtained.  The patient was kept under observation until the patient left the procedure room.  The patient left the procedure room in stable condition.   Agitated microbubble saline contrast was not administered.  COMPLICATIONS:    There were no immediate complications.  FINDINGS:  Normal LV function. There is severe mitral insufficiency due to flail motion of the A3 (medial) portion of the anterior mitral scallop. No clear vegetations are seen. The valve leaflets do not appear myxomatous, but do have some degenerative changes. The other valves are normal. No vegetations are seen on the pacemaker leads. Trivial pericardial effusion.  RECOMMENDATIONS:    Although no vegetations are seen, in the setting of Strep mitis endocarditis, suspect flail motion of the anterior mitral leaflet is likely a consequence of endocarditis. No vegetations are seen on the pacemaker leads.  Time Spent Directly with the Patient:  45 minutes   Grant Bowers 06/18/2023, 8:17 AM

## 2023-06-18 NOTE — Plan of Care (Signed)
  Problem: Coping: Goal: Ability to adjust to condition or change in health will improve Outcome: Progressing   Problem: Clinical Measurements: Goal: Will remain free from infection Outcome: Progressing   Problem: Activity: Goal: Risk for activity intolerance will decrease Outcome: Progressing   Problem: Coping: Goal: Level of anxiety will decrease Outcome: Progressing   Problem: Pain Managment: Goal: General experience of comfort will improve and/or be controlled Outcome: Progressing

## 2023-06-19 ENCOUNTER — Encounter (HOSPITAL_COMMUNITY): Payer: Self-pay | Admitting: Cardiovascular Disease

## 2023-06-19 DIAGNOSIS — M4620 Osteomyelitis of vertebra, site unspecified: Secondary | ICD-10-CM | POA: Diagnosis not present

## 2023-06-19 LAB — COMPREHENSIVE METABOLIC PANEL
ALT: 35 U/L (ref 0–44)
AST: 27 U/L (ref 15–41)
Albumin: 2 g/dL — ABNORMAL LOW (ref 3.5–5.0)
Alkaline Phosphatase: 59 U/L (ref 38–126)
Anion gap: 11 (ref 5–15)
BUN: 20 mg/dL (ref 8–23)
CO2: 20 mmol/L — ABNORMAL LOW (ref 22–32)
Calcium: 8.4 mg/dL — ABNORMAL LOW (ref 8.9–10.3)
Chloride: 105 mmol/L (ref 98–111)
Creatinine, Ser: 1.05 mg/dL (ref 0.61–1.24)
GFR, Estimated: >60 mL/min (ref >60)
Glucose, Bld: 155 mg/dL — ABNORMAL HIGH (ref 70–99)
Potassium: 3.6 mmol/L (ref 3.5–5.1)
Sodium: 136 mmol/L (ref 135–145)
Total Bilirubin: 0.7 mg/dL (ref 0.0–1.2)
Total Protein: 4.9 g/dL — ABNORMAL LOW (ref 6.5–8.1)

## 2023-06-19 LAB — GLUCOSE, CAPILLARY
Glucose-Capillary: 170 mg/dL — ABNORMAL HIGH (ref 70–99)
Glucose-Capillary: 165 mg/dL — ABNORMAL HIGH (ref 70–99)
Glucose-Capillary: 161 mg/dL — ABNORMAL HIGH (ref 70–99)
Glucose-Capillary: 174 mg/dL — ABNORMAL HIGH (ref 70–99)

## 2023-06-19 LAB — PHOSPHORUS: Phosphorus: 3.5 mg/dL (ref 2.5–4.6)

## 2023-06-19 LAB — CBC WITH DIFFERENTIAL/PLATELET
Abs Immature Granulocytes: 0.06 10*3/uL (ref 0.00–0.07)
Basophils Absolute: 0 10*3/uL (ref 0.0–0.1)
Basophils Relative: 0 %
Eosinophils Absolute: 0.1 10*3/uL (ref 0.0–0.5)
Eosinophils Relative: 1 %
HCT: 26.1 % — ABNORMAL LOW (ref 39.0–52.0)
Hemoglobin: 8.4 g/dL — ABNORMAL LOW (ref 13.0–17.0)
Immature Granulocytes: 1 %
Lymphocytes Relative: 13 %
Lymphs Abs: 1.2 10*3/uL (ref 0.7–4.0)
MCH: 26.7 pg (ref 26.0–34.0)
MCHC: 32.2 g/dL (ref 30.0–36.0)
MCV: 82.9 fL (ref 80.0–100.0)
Monocytes Absolute: 0.6 10*3/uL (ref 0.1–1.0)
Monocytes Relative: 6 %
Neutro Abs: 7.6 10*3/uL (ref 1.7–7.7)
Neutrophils Relative %: 79 %
Platelets: 286 10*3/uL (ref 150–400)
RBC: 3.15 MIL/uL — ABNORMAL LOW (ref 4.22–5.81)
RDW: 14.9 % (ref 11.5–15.5)
WBC: 9.6 10*3/uL (ref 4.0–10.5)
nRBC: 0 % (ref 0.0–0.2)

## 2023-06-19 LAB — BRAIN NATRIURETIC PEPTIDE: B Natriuretic Peptide: 173.7 pg/mL — ABNORMAL HIGH (ref 0.0–100.0)

## 2023-06-19 LAB — C-REACTIVE PROTEIN: CRP: 8.8 mg/dL — ABNORMAL HIGH (ref <1.0)

## 2023-06-19 LAB — PROCALCITONIN: Procalcitonin: 0.26 ng/mL

## 2023-06-19 LAB — MAGNESIUM: Magnesium: 2 mg/dL (ref 1.7–2.4)

## 2023-06-19 NOTE — Progress Notes (Addendum)
 [facility] PROGRESS NOTE   Grant Bowers WUJ:[id number] DOB: 08-07-1945 DOA: 06/14/2023  PCP: [name], MD  Brief History: 78 y.o. male with medical history significant of  type 2 diabetes mellitus, essential hypertension, mixed hyperlipidemia, low back pain (on oxycodone at home.), trifascicular block s/p PPM,  OSA on CPAP, GERD who presented to the ED emergency department due to worsening low back pain.  Imaging studies raised concern for discitis.  He was hospitalized for further management.    Consultants: Phone discussion with neurosurgery.  Infectious disease, cardiology/EP  Procedures:   TEE - ? Sessile MV vegetation.    TTE - 1. Left ventricular ejection fraction, by estimation, is 55 to 60%. The left ventricle has normal function.  2. Right ventricular systolic function is normal. The right ventricular size is normal.  3. A small pericardial effusion is present. The pericardial effusion is circumferential.  4. Mild mitral valve regurgitation. No evidence of mitral stenosis.  5. The aortic valve is tricuspid. There is mild calcification of the aortic valve. There is mild thickening of the aortic valve.  6. The inferior vena cava is normal in size with greater than 50% respiratory variability, suggesting right atrial pressure of 3 mmHg.  7. Limited echo     Subjective :   Patient in bed, appears comfortable, denies any headache, no fever, no chest pain or pressure, no shortness of breath , no abdominal pain. No focal weakness.  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 Case was discussed with neurosurgery who recommended MRI.  Patient has a pacemaker so he was transferred to [facility]  Informed by MRI tech that due to the type of pacemaker that he has along with the position of the leads he is  unfortunately unable to get MRI safely.,  Per neurosurgery no further imaging required.  In by ID on IV antibiotics, source likely to oral dentition for which she will require outpatient dental follow-up, TTE stable,TEE ? Sessile MV vegetation.  Continue him on supportive care with antibiotics, pain control, PT OT, case discussed with ID, repeat blood cultures on 06/18/2023, monitor results.    Also being followed by EP who are considering device extraction due to positive blood cultures & ? MV findings, defer management to them.    Hyponatremia Stable to improved.  Hypokalemia Will be supplemented.  Magnesium was 2.0 yesterday.  Normocytic anemia No evidence of overt bleeding.  QT prolongation Correct electrolytes.  Repeat EKG showed improvement in QT prolongation.  Essential hypertension Noted to be on amlodipine and irbesartan.  Blood pressure is reasonably well-controlled.  History of BPH Continue alfuzosin.  History of hyperlipidemia Statin.  Recently diagnosed cholelithiasis Outpatient follow-up with general surgery.  Diabetes mellitus type 2 with hyperglycemia Metformin on hold.  Continue SSI.  HbA1c is 6.2.  Lab Results  Component Value Date   HGBA1C 6.2 04/17/2023   CBG (last 3)  Recent Labs    06/18/23 1720 06/18/23 2033 06/19/23 0816  GLUCAP 112* 169* 170*    DVT Prophylaxis: SCDs for now Code Status: Full code Family Communication: Discussed with patient and his wife bedside on 06/18/2023 Disposition Plan: To be determined  Status is: Inpatient Remains inpatient appropriate because: Need for IV antibiotics      Medications: Scheduled:  alfuzosin  10 mg Oral QHS   amLODipine  5 mg Oral Daily  feeding supplement  237 mL Oral BID BM   feeding supplement (GLUCERNA SHAKE)  237 mL Oral TID BM   heparin injection (subcutaneous)  5,000 Units Subcutaneous Q8H   insulin aspart  0-9 Units Subcutaneous TID WC   irbesartan  75 mg Oral Daily   lidocaine   2 patch Transdermal Q24H   LORazepam  1 mg Intravenous Once   polyethylene glycol  17 g Oral Daily   rosuvastatin  20 mg Oral Daily   senna-docusate  2 tablet Oral BID   Continuous:  cefTRIAXone (ROCEPHIN)  IV 2 g (06/18/23 1112)   ZOX:[id number] **OR** acetaminophen, HYDROmorphone (DILAUDID) injection, oxyCODONE, prochlorperazine  Objective:  Vital Signs  Vitals:   06/18/23 2034 06/18/23 2344 06/19/23 0442 06/19/23 0819  BP: (!) 134/57 (!) 88/75 (!) 143/55 (!) 121/48  Pulse:   90 72  Resp:      Temp: 97.8 F (36.6 C) 97.9 F (36.6 C) 98.6 F (37 C) 98.6 F (37 C)  TempSrc: Oral Oral Oral Oral  SpO2:   95% 93%  Weight:      Height:        Intake/Output Summary (Last 24 hours) at 06/19/2023 0956 Last data filed at 06/19/2023 0819 Gross per 24 hour  Intake 320 ml  Output 850 ml  Net -530 ml   Filed Weights   06/14/23 1645  Weight: 80.4 kg    Exam  Awake Alert, No new F.N deficits, bilateral plantars downgoing [place].AT,PERRAL Supple Neck, No JVD,   Symmetrical Chest wall movement, Good air movement bilaterally, CTAB RRR,No Gallops, Rubs or new Murmurs,  +ve B.Sounds, Abd Soft, No tenderness,   No Cyanosis, Clubbing or edema     Lab Results:   Data Review:   Inpatient Medications  Scheduled Meds:  alfuzosin  10 mg Oral QHS   amLODipine  5 mg Oral Daily   feeding supplement  237 mL Oral BID BM   feeding supplement (GLUCERNA SHAKE)  237 mL Oral TID BM   heparin injection (subcutaneous)  5,000 Units Subcutaneous Q8H   insulin aspart  0-9 Units Subcutaneous TID WC   irbesartan  75 mg Oral Daily   lidocaine  2 patch Transdermal Q24H   LORazepam  1 mg Intravenous Once   polyethylene glycol  17 g Oral Daily   rosuvastatin  20 mg Oral Daily   senna-docusate  2 tablet Oral BID   Continuous Infusions:  cefTRIAXone (ROCEPHIN)  IV 2 g (06/18/23 1112)   PRN Meds:.acetaminophen **OR** acetaminophen, HYDROmorphone (DILAUDID) injection, oxyCODONE,  prochlorperazine  DVT Prophylaxis  heparin injection 5,000 Units Start: 06/18/23 1400 SCDs Start: 06/15/23 0359   Recent Labs  Lab 06/14/23 1725 06/15/23 0428 06/16/23 0554 06/17/23 0559 06/18/23 0531 06/19/23 0436  WBC 15.2* 12.5* 10.6* 9.3 9.9 9.6  HGB 11.1* 9.7* 9.8* 9.0* 8.8* 8.4*  HCT 33.9* 30.9* 30.1* 28.3* 27.3* 26.1*  PLT 334 272 274 275 285 286  MCV 81.5 83.5 82.5 83.0 82.7 82.9  MCH 26.7 26.2 26.8 26.4 26.7 26.7  MCHC 32.7 31.4 32.6 31.8 32.2 32.2  RDW 14.7 14.7 14.7 14.7 14.8 14.9  LYMPHSABS 0.3*  --   --  0.9 1.0 1.2  MONOABS 0.7  --   --  0.7 0.7 0.6  EOSABS 0.0  --   --  0.1 0.1 0.1  BASOSABS 0.0  --   --  0.0 0.0 0.0    Recent Labs  Lab 06/14/23 1725 06/14/23 1907 06/15/23 0428 06/15/23 0756 06/16/23 0554 06/17/23 0559  06/18/23 0531 06/19/23 0436  NA 127*  --  132*  --  133* 135 136 136  K 4.0  --  3.8  --  3.2* 3.9 4.3 3.6  CL 97*  --  102  --  102 103 107 105  CO2 17*  --  23  --  21* 22 23 20*  ANIONGAP 13  --  7  --  10 10 6 11   GLUCOSE 287*  --  179*  --  158* 177* 156* 155*  BUN 22  --  25*  --  22 17 15 20   CREATININE 1.44*  --  1.11  --  1.01 0.91 1.02 1.05  AST 20  --  14*  --  25 19 23 27   ALT 21  --  20  --  25 24 29  35  ALKPHOS 84  --  69  --  74 70 64 59  BILITOT 1.2  --  0.8  --  0.7 0.8 0.7 0.7  ALBUMIN 2.5*  --  2.3*  --  2.2* 2.1* 2.1* 2.0*  CRP  --   --   --   --   --  8.4* 9.3* 8.8*  PROCALCITON  --   --   --   --   --  0.71 0.35 0.26  LATICACIDVEN 3.1* 1.6  --  0.8  --   --   --   --   BNP  --   --   --   --   --  200.6* 191.4* 173.7*  MG  --   --  2.0  --   --  2.1 2.0 2.0  PHOS  --   --  3.8  --   --   --  3.2 3.5  CALCIUM 8.4*  --  8.1*  --  8.2* 8.2* 8.3* 8.4*      Recent Labs  Lab 06/14/23 1725 06/14/23 1907 06/15/23 0428 06/15/23 0756 06/16/23 0554 06/17/23 0559 06/18/23 0531 06/19/23 0436  CRP  --   --   --   --   --  8.4* 9.3* 8.8*  PROCALCITON  --   --   --   --   --  0.71 0.35 0.26  LATICACIDVEN  3.1* 1.6  --  0.8  --   --   --   --   BNP  --   --   --   --   --  200.6* 191.4* 173.7*  MG  --   --  2.0  --   --  2.1 2.0 2.0  CALCIUM 8.4*  --  8.1*  --  8.2* 8.2* 8.3* 8.4*    --------------------------------------------------------------------------------------------------------------- Lab Results  Component Value Date   CHOL 96 (L) 05/03/2023   HDL 30 (L) 05/03/2023   LDLCALC 49 05/03/2023   LDLDIRECT 150.3 02/11/2010   TRIG 82 05/03/2023   CHOLHDL 3.2 05/03/2023    Lab Results  Component Value Date   HGBA1C 6.2 04/17/2023   Radiology Reports  ECHO TEE Result Date: 06/18/2023    TRANSESOPHOGEAL ECHO REPORT   Patient Name:   Grant Bowers Date of Exam: 06/18/2023 Medical Rec #:  [id number]     Height:       72.0 in Accession #:    [id number]    Weight:       177.2 lb Date of Birth:  [birth year]    BSA:          2.024 m Patient Age:  77 years      BP:           148/81 mmHg Patient Gender: M             HR:           97 bpm. Exam Location:  Inpatient Procedure: Transesophageal Echo, 3D Echo, Cardiac Doppler and Color Doppler            (Both Spectral and Color Flow Doppler were utilized during            procedure). Indications:     Bacteremia  History:         Patient has prior history of Echocardiogram examinations, most                  recent 06/17/2023. Risk Factors:Hypertension, Diabetes and Sleep                  Apnea.  Sonographer:     Darlys Gales Referring Phys:  [id number] HAO MENG Diagnosing Phys: Thurmon Fair MD PROCEDURE: After discussion of the risks and benefits of a TEE, an informed consent was obtained from a family member. The transesophogeal probe was passed without difficulty through the esophogus of the patient. Sedation performed by different physician. The patient was monitored while under deep sedation. Anesthestetic sedation was provided intravenously by Anesthesiology: 155mg  of Propofol. The patient developed no complications during the procedure.  IMPRESSIONS  1.  Left ventricular ejection fraction, by estimation, is 55 to 60%. The left ventricle has low normal function.  2. Right ventricular systolic function is normal. The right ventricular size is normal. Tricuspid regurgitation signal is inadequate for assessing PA pressure.  3. Left atrial size was moderately dilated. No left atrial/left atrial appendage thrombus was detected. The LAA emptying velocity was 60 cm/s.  4. No clear large vegetation is seen. At the level of the medial portion of the anterior leaflet (A3) there is a segment of flail leaflet that is slightly thickened (maximum 4 mm thickness). This may be a sessile vegetation. There is severe MR: vena contracta 5 mm, effective regurgitant orifice area 0.63 cm sq, regurgitant volume 86 ml, regurgitant fraction 60%. The mitral valve is abnormal. Severe mitral valve regurgitation. No evidence of mitral stenosis.  5. The aortic valve is tricuspid. Aortic valve regurgitation is not visualized. No aortic stenosis is present.  6. There is Moderate (Grade III) plaque involving the descending aorta.  7. 3D performed of the mitral valve and demonstrates Small vegetation versus flail segment of A3 scallop of the mitral valve. Comparison(s): Mitral insufficiency has been present on transthoracic echo at least since 2022 and has probably been underestimated due to its eccentric jet. There is no previous TEE for comparison. Conclusion(s)/Recommendation(s): Findings are concerning for vegetation/infective endocarditis as detailed above. FINDINGS  Left Ventricle: Left ventricular ejection fraction, by estimation, is 55 to 60%. The left ventricle has low normal function. The left ventricular internal cavity size was normal in size. There is no left ventricular hypertrophy. Right Ventricle: The right ventricular size is normal. No increase in right ventricular wall thickness. Right ventricular systolic function is normal. Tricuspid regurgitation signal is inadequate for  assessing PA pressure. Left Atrium: Left atrial size was moderately dilated. No left atrial/left atrial appendage thrombus was detected. The LAA emptying velocity was 60 cm/s. Right Atrium: Right atrial size was normal in size. Pericardium: Trivial pericardial effusion is present. The pericardial effusion is circumferential. Mitral Valve: No clear large vegetation is seen. At  the level of the medial portion of the anterior leaflet (A3) there is a segment of flail leaflet that is slightly thickened (maximum 4 mm thickness). This may be a sessile vegetation. There is severe MR: vena contracta 5 mm, effective regurgitant orifice area 0.63 cm sq, regurgitant volume 86 ml, regurgitant fraction 60%. The mitral valve is abnormal. Severe mitral valve regurgitation, with eccentric posteriorly directed jet. No evidence of mitral valve stenosis. The mean mitral valve gradient is 2.1 mmHg. Tricuspid Valve: The tricuspid valve is normal in structure. Tricuspid valve regurgitation is trivial. Aortic Valve: The aortic valve is tricuspid. Aortic valve regurgitation is not visualized. No aortic stenosis is present. Pulmonic Valve: The pulmonic valve was normal in structure. Pulmonic valve regurgitation is not visualized. No evidence of pulmonic stenosis. Aorta: The aortic root, ascending aorta, aortic arch and descending aorta are all structurally normal, with no evidence of dilitation or obstruction. There is moderate (Grade III) plaque involving the descending aorta. Venous: A systolic blunting flow pattern is recorded from the left upper pulmonary vein and the right upper pulmonary vein. IAS/Shunts: No atrial level shunt detected by color flow Doppler. Additional Comments: 3D was performed not requiring image post processing on an independent workstation and was abnormal. A device lead is visualized in the right ventricle, right atrium and superior vena cava. LEFT VENTRICLE PLAX 2D LVOT diam:     2.24 cm LV SV:         57 LV SV  Index:   28 LVOT Area:     3.96 cm  AORTIC VALVE LVOT Vmax:   74.20 cm/s LVOT Vmean:  59.200 cm/s LVOT VTI:    0.145 m MITRAL VALVE MV Mean grad: 2.1 mmHg SHUNTS                        Systemic VTI:  0.14 m                        Systemic Diam: 2.24 cm Thurmon Fair MD Electronically signed by Thurmon Fair MD Signature Date/Time: 06/18/2023/12:06:06 PM    Final    EP STUDY Result Date: 06/18/2023 See surgical note for result.  ECHOCARDIOGRAM LIMITED Result Date: 06/17/2023    ECHOCARDIOGRAM LIMITED REPORT   Patient Name:   Grant Bowers Date of Exam: 06/17/2023 Medical Rec #:  [id number]     Height:       72.0 in Accession #:    [id number]    Weight:       177.2 lb Date of Birth:  Apr 10, 1946    BSA:          2.024 m Patient Age:    80 years      BP:           132/64 mmHg Patient Gender: M             HR:           110 bpm. Exam Location:  Inpatient Procedure: Limited Echo, Cardiac Doppler and Color Doppler (Both Spectral and            Color Flow Doppler were utilized during procedure). Indications:    endocarditis  History:        Patient has prior history of Echocardiogram examinations, most                 recent 05/14/2023. Pacemaker, chronic kidney disease; Risk  Factors:Hypertension, Dyslipidemia, Diabetes and Sleep Apnea.  Sonographer:    Delcie Roch RDCS Referring Phys: [id number] [facility] IMPRESSIONS  1. Left ventricular ejection fraction, by estimation, is 55 to 60%. The left ventricle has normal function.  2. Right ventricular systolic function is normal. The right ventricular size is normal.  3. A small pericardial effusion is present. The pericardial effusion is circumferential.  4. Mild mitral valve regurgitation. No evidence of mitral stenosis.  5. The aortic valve is tricuspid. There is mild calcification of the aortic valve. There is mild thickening of the aortic valve.  6. The inferior vena cava is normal in size with greater than 50% respiratory variability, suggesting  right atrial pressure of 3 mmHg.  7. Limited echo FINDINGS  Left Ventricle: Left ventricular ejection fraction, by estimation, is 55 to 60%. The left ventricle has normal function. Right Ventricle: The right ventricular size is normal. Right vetricular wall thickness was not well visualized. Right ventricular systolic function is normal. Pericardium: A small pericardial effusion is present. The pericardial effusion is circumferential. Mitral Valve: Mild mitral valve regurgitation. No evidence of mitral valve stenosis. Tricuspid Valve: The tricuspid valve is normal in structure. Tricuspid valve regurgitation is not demonstrated. No evidence of tricuspid stenosis. Aortic Valve: The aortic valve is tricuspid. There is mild calcification of the aortic valve. There is mild thickening of the aortic valve. There is mild aortic valve annular calcification. Venous: The inferior vena cava is normal in size with greater than 50% respiratory variability, suggesting right atrial pressure of 3 mmHg. Additional Comments: A device lead is visualized in the right atrium and right ventricle. Spectral Doppler performed. Color Doppler performed.  IVC IVC diam: 1.70 cm MR Peak grad: 119.7 mmHg MR Mean grad: 71.0 mmHg MR Vmax:      547.00 cm/s MR Vmean:     397.0 cm/s Dina Rich MD Electronically signed by Dina Rich MD Signature Date/Time: 06/17/2023/7:39:57 PM    Final       Signature  -   Susa Raring M.D on 06/19/2023 at 9:56 AM   -  To page go to [email or web address]

## 2023-06-19 NOTE — Progress Notes (Signed)
 Physical Therapy Treatment Patient Details Name: Grant Bowers MRN: [id number] DOB: 09/17/1945 Today's Date: 06/19/2023   History of Present Illness Pt is 78 yo presenting to [facility] ED on 3/6 due to increased pain in LB with concerns for discitis. 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 TEE 3/10. PMH: DM II, HTN, mixed hyperlipidemia, LBP, trifascicular block s/p PPM, OSA on CPAP, GERD.    PT Comments  Pt resting in bed on arrival, eager to attempt mobility and requesting to be up to [facility], however pt continues to be very limited by pain and anticipation of pain with pt verbalizing with all movements and internally distracted throughout session. Pt needing mod A to roll R/L with pt able to flex knees/hips past 90* in supine and initiate start of movement. Pt requiring max-total A to attempt sidelying>sit this session with pt able to come about halfway to sitting before stating inability to continue and lying back on side, able to attempt x3 during session. Pt educated on LE exercises with pt able to demonstrate with good tolerance and no increase in pain. Pt motivated to progress and continues to benefit from skilled PT services to progress toward functional mobility goals.      If plan is discharge home, recommend the following: A lot of help with walking and/or transfers;A lot of help with bathing/dressing/bathroom;Help with stairs or ramp for entrance;Assistance with cooking/housework   Can travel by Doctor, hospital (measurements PT);Wheelchair cushion (measurements PT)    Recommendations for Other Services       Precautions / Restrictions Precautions Precautions: Fall Restrictions Weight Bearing Restrictions Per Provider Order: No     Mobility  Bed Mobility Overal bed mobility: Needs Assistance Bed Mobility: Rolling, Sidelying to Sit Rolling: Mod assist Sidelying to sit: Total assist, HOB elevated       General bed mobility comments:  mod A to roll R/L and up to total A to attempt rise from sidelying to sitting with pt cvarbalizing in pain and stating inability to complete, despite self-motivation and asking to use BSC, attempted x3    Transfers Overall transfer level: Needs assistance                 General transfer comment: unable to progress to EOB this session    Ambulation/Gait                   Stairs             Wheelchair Mobility     Tilt Bed    Modified Rankin (Stroke Patients Only)       Balance Overall balance assessment: Needs assistance                                          Communication Communication Communication: No apparent difficulties  Cognition Arousal: Alert Behavior During Therapy: WFL for tasks assessed/performed, Anxious   PT - Cognitive impairments: No apparent impairments                         Following commands: Intact      Cueing Cueing Techniques: Verbal cues, Tactile cues  Exercises General Exercises - Lower Extremity Heel Slides: AROM, Both, 10 reps, Supine Hip Flexion/Marching: AROM, Both, 10 reps, Supine    General Comments  Pertinent Vitals/Pain Pain Assessment Pain Assessment: Faces Faces Pain Scale: Hurts worst Pain Location: B hips, low back Pain Descriptors / Indicators: Grimacing, Sharp, Sore, Shooting, Moaning Pain Intervention(s): Monitored during session, Limited activity within patient's tolerance, Patient requesting pain meds-RN notified    Home Living                          Prior Function            PT Goals (current goals can now be found in the care plan section) Acute Rehab PT Goals Patient Stated Goal: Decrease pain PT Goal Formulation: With patient Time For Goal Achievement: 07/01/23 Progress towards PT goals: Not progressing toward goals - comment (pain limiting)    Frequency    Min 3X/week      PT Plan      Co-evaluation               AM-PAC PT "6 Clicks" Mobility   Outcome Measure  Help needed turning from your back to your side while in a flat bed without using bedrails?: Total Help needed moving from lying on your back to sitting on the side of a flat bed without using bedrails?: Total Help needed moving to and from a bed to a chair (including a wheelchair)?: Total Help needed standing up from a chair using your arms (e.g., wheelchair or bedside chair)?: Total Help needed to walk in hospital room?: Total Help needed climbing 3-5 steps with a railing? : Total 6 Click Score: 6    End of Session   Activity Tolerance: Patient tolerated treatment well;Patient limited by pain Patient left: in bed;with call bell/phone within reach;with bed alarm set Nurse Communication: Mobility status PT Visit Diagnosis: Unsteadiness on feet (R26.81);Other abnormalities of gait and mobility (R26.89);Muscle weakness (generalized) (M62.81)     Time: 6213-0865 PT Time Calculation (min) (ACUTE ONLY): 28 min  Charges:    $Therapeutic Activity: 23-37 mins PT General Charges $$ ACUTE PT VISIT: 1 Visit                     Ayden Apodaca R. PTA Acute Rehabilitation Services Office: [phone]   Catalina Antigua 06/19/2023, 12:03 PM

## 2023-06-19 NOTE — Progress Notes (Addendum)
  Patient Name: Grant Bowers Date of Encounter: 06/19/2023  Primary Cardiologist: Marjo Bicker, MD Electrophysiologist: Maurice Small, MD  Interval Summary   Viona Gilmore. Pain under better control currently while laying flat in bed.     At this time, the patient denies chest pain, shortness of breath, or any new concerns.  Vital Signs    Vitals:   06/18/23 2034 06/18/23 2344 06/19/23 0442 06/19/23 0819  BP: (!) 134/57 (!) 88/75 (!) 143/55 (!) 121/48  Pulse:   90 72  Resp:      Temp: 97.8 F (36.6 C) 97.9 F (36.6 C) 98.6 F (37 C) 98.6 F (37 C)  TempSrc: Oral Oral Oral Oral  SpO2:   95% 93%  Weight:      Height:        Intake/Output Summary (Last 24 hours) at 06/19/2023 0838 Last data filed at 06/19/2023 0819 Gross per 24 hour  Intake 320 ml  Output 850 ml  Net -530 ml   Filed Weights   06/14/23 1645  Weight: 80.4 kg    Physical Exam    GEN- The patient is well appearing, alert and oriented x 3 today. Appears much more comfortable today Lungs- Clear to ausculation bilaterally, normal work of breathing Cardiac- Regular rate and rhythm, no murmurs, rubs or gallops GI- soft, NT, ND, + BS Extremities- no clubbing or cyanosis. No edema  Telemetry    Off tele (personally reviewed)  Hospital Course    Suyash Amory is a 78 y.o. male history of T2DM, HTN, low back pain, trifascicular block s/p PPM, OSA on CPAP, GERD admitted for discitis, found to be bacteremic.   EP consulted for bacteremia with PPM in situ.   3/10 - device programmed to VVI 40 3/11 - 0% VP, programmed to VVI 30  Assessment & Plan    #) trifascicular block s/p St. Jude PPM #) Staph mitis/oralis bacteremic CT abd/pel concerning for vertebral discitis Blood cultures 2/2 positive for staph mitis/oralis TEE with negative lead vegetation Anticipate needing device extraction, leads implanted 2022 0% ventricular pacing at VVI 40 Planning for pacemaker extraction 3/13. Reviewed  risks/benefits of procedure with patient including the need for CTS backup for emergent surgery. Patient verbalized understanding and wished to proceed. Continue abx as per ID   #) acute on chronic low back pain #) OSA on CPAP #) T2DM #) HTN Mgmt per primary service       For questions or updates, please contact [facility] Please consult [email or web address] for contact info under Cardiology/STEMI.  Signed, Sherie Don, NP  06/19/2023, 8:38 AM

## 2023-06-19 NOTE — Anesthesia Postprocedure Evaluation (Signed)
 Anesthesia Post Note  Patient: Grant Bowers  Procedure(s) Performed: TRANSESOPHAGEAL ECHOCARDIOGRAM     Patient location during evaluation: Cath Lab Anesthesia Type: MAC Level of consciousness: awake Pain management: pain level controlled Vital Signs Assessment: post-procedure vital signs reviewed and stable Respiratory status: spontaneous breathing, nonlabored ventilation and respiratory function stable Cardiovascular status: blood pressure returned to baseline and stable Postop Assessment: no apparent nausea or vomiting Anesthetic complications: no   No notable events documented.  Last Vitals:  Vitals:   06/19/23 1200 06/19/23 1602  BP: 132/79 137/76  Pulse: (!) 105 91  Resp: 19 20  Temp:  36.7 C  SpO2: 99% 99%    Last Pain:  Vitals:   06/19/23 1602  TempSrc: Oral  PainSc:    Pain Goal: Patients Stated Pain Goal: 0 (06/18/23 1114)                 Grant Bowers

## 2023-06-19 NOTE — Care Management Important Message (Signed)
 Important Message  Patient Details  Name: Grant Bowers MRN: [id number] Date of Birth: [birth year]   Important Message Given:  Yes - Medicare IM     Dorena Bodo 06/19/2023, 3:06 PM

## 2023-06-19 NOTE — Plan of Care (Signed)
  Problem: Health Behavior/Discharge Planning: Goal: Ability to manage health-related needs will improve Outcome: Progressing   Problem: Activity: Goal: Risk for activity intolerance will decrease Outcome: Progressing   Problem: Coping: Goal: Level of anxiety will decrease Outcome: Progressing   Problem: Pain Managment: Goal: General experience of comfort will improve and/or be controlled Outcome: Progressing

## 2023-06-19 NOTE — Plan of Care (Signed)
  Problem: Education: Goal: Ability to describe self-care measures that may prevent or decrease complications (Diabetes Survival Skills Education) will improve Outcome: Progressing Goal: Individualized Educational Video(s) Outcome: Progressing   Problem: Fluid Volume: Goal: Ability to maintain a balanced intake and output will improve Outcome: Progressing   Problem: Metabolic: Goal: Ability to maintain appropriate glucose levels will improve Outcome: Progressing   Problem: Nutritional: Goal: Maintenance of adequate nutrition will improve Outcome: Progressing Goal: Progress toward achieving an optimal weight will improve Outcome: Progressing   Problem: Skin Integrity: Goal: Risk for impaired skin integrity will decrease Outcome: Progressing   Problem: Tissue Perfusion: Goal: Adequacy of tissue perfusion will improve Outcome: Progressing   Problem: Education: Goal: Knowledge of General Education information will improve Description: Including pain rating scale, medication(s)/side effects and non-pharmacologic comfort measures Outcome: Progressing   Problem: Health Behavior/Discharge Planning: Goal: Ability to manage health-related needs will improve Outcome: Progressing   Problem: Clinical Measurements: Goal: Ability to maintain clinical measurements within normal limits will improve Outcome: Progressing Goal: Will remain free from infection Outcome: Progressing Goal: Diagnostic test results will improve Outcome: Progressing Goal: Respiratory complications will improve Outcome: Progressing Goal: Cardiovascular complication will be avoided Outcome: Progressing   Problem: Nutrition: Goal: Adequate nutrition will be maintained Outcome: Progressing   Problem: Elimination: Goal: Will not experience complications related to bowel motility Outcome: Progressing Goal: Will not experience complications related to urinary retention Outcome: Progressing   Problem: Pain  Managment: Goal: General experience of comfort will improve and/or be controlled Outcome: Progressing   Problem: Safety: Goal: Ability to remain free from injury will improve Outcome: Progressing   Problem: Skin Integrity: Goal: Risk for impaired skin integrity will decrease Outcome: Progressing

## 2023-06-20 DIAGNOSIS — M4620 Osteomyelitis of vertebra, site unspecified: Secondary | ICD-10-CM | POA: Diagnosis not present

## 2023-06-20 LAB — CBC WITH DIFFERENTIAL/PLATELET
Abs Immature Granulocytes: 0.06 10*3/uL (ref 0.00–0.07)
Basophils Absolute: 0 10*3/uL (ref 0.0–0.1)
Basophils Relative: 0 %
Eosinophils Absolute: 0.1 10*3/uL (ref 0.0–0.5)
Eosinophils Relative: 1 %
HCT: 26 % — ABNORMAL LOW (ref 39.0–52.0)
Hemoglobin: 8.8 g/dL — ABNORMAL LOW (ref 13.0–17.0)
Immature Granulocytes: 1 %
Lymphocytes Relative: 12 %
Lymphs Abs: 1.1 10*3/uL (ref 0.7–4.0)
MCH: 27.7 pg (ref 26.0–34.0)
MCHC: 33.8 g/dL (ref 30.0–36.0)
MCV: 81.8 fL (ref 80.0–100.0)
Monocytes Absolute: 0.5 10*3/uL (ref 0.1–1.0)
Monocytes Relative: 6 %
Neutro Abs: 7.4 10*3/uL (ref 1.7–7.7)
Neutrophils Relative %: 80 %
Platelets: 310 10*3/uL (ref 150–400)
RBC: 3.18 MIL/uL — ABNORMAL LOW (ref 4.22–5.81)
RDW: 15.1 % (ref 11.5–15.5)
WBC: 9.2 10*3/uL (ref 4.0–10.5)
nRBC: 0 % (ref 0.0–0.2)

## 2023-06-20 LAB — PREPARE RBC (CROSSMATCH)

## 2023-06-20 LAB — COMPREHENSIVE METABOLIC PANEL
ALT: 48 U/L — ABNORMAL HIGH (ref 0–44)
AST: 34 U/L (ref 15–41)
Albumin: 2 g/dL — ABNORMAL LOW (ref 3.5–5.0)
Alkaline Phosphatase: 66 U/L (ref 38–126)
Anion gap: 6 (ref 5–15)
BUN: 17 mg/dL (ref 8–23)
CO2: 20 mmol/L — ABNORMAL LOW (ref 22–32)
Calcium: 8.2 mg/dL — ABNORMAL LOW (ref 8.9–10.3)
Chloride: 108 mmol/L (ref 98–111)
Creatinine, Ser: 1.07 mg/dL (ref 0.61–1.24)
GFR, Estimated: >60 mL/min (ref >60)
Glucose, Bld: 160 mg/dL — ABNORMAL HIGH (ref 70–99)
Potassium: 3.5 mmol/L (ref 3.5–5.1)
Sodium: 134 mmol/L — ABNORMAL LOW (ref 135–145)
Total Bilirubin: 0.7 mg/dL (ref 0.0–1.2)
Total Protein: 5.1 g/dL — ABNORMAL LOW (ref 6.5–8.1)

## 2023-06-20 LAB — PROCALCITONIN: Procalcitonin: <0.1 ng/mL

## 2023-06-20 LAB — GLUCOSE, CAPILLARY
Glucose-Capillary: 163 mg/dL — ABNORMAL HIGH (ref 70–99)
Glucose-Capillary: 192 mg/dL — ABNORMAL HIGH (ref 70–99)
Glucose-Capillary: 154 mg/dL — ABNORMAL HIGH (ref 70–99)
Glucose-Capillary: 122 mg/dL — ABNORMAL HIGH (ref 70–99)

## 2023-06-20 LAB — SURGICAL PCR SCREEN
MRSA, PCR: NEGATIVE
Staphylococcus aureus: NEGATIVE

## 2023-06-20 LAB — MAGNESIUM: Magnesium: 1.9 mg/dL (ref 1.7–2.4)

## 2023-06-20 LAB — C-REACTIVE PROTEIN: CRP: 7.8 mg/dL — ABNORMAL HIGH (ref <1.0)

## 2023-06-20 LAB — PHOSPHORUS: Phosphorus: 3.1 mg/dL (ref 2.5–4.6)

## 2023-06-20 LAB — BRAIN NATRIURETIC PEPTIDE: B Natriuretic Peptide: 349.9 pg/mL — ABNORMAL HIGH (ref 0.0–100.0)

## 2023-06-20 LAB — ABO/RH: ABO/RH(D): A POS

## 2023-06-20 MED ORDER — CHLORHEXIDINE GLUCONATE 4 % EX SOLN
60.0000 mL | Freq: Once | CUTANEOUS | Status: DC
  Filled 2023-06-20: qty 60

## 2023-06-20 MED ORDER — SODIUM CHLORIDE 0.9% FLUSH
3.0000 mL | Freq: Two times a day (BID) | INTRAVENOUS | Status: DC
  Administered 2023-06-20 – 2023-06-30 (×13): 3 mL via INTRAVENOUS

## 2023-06-20 NOTE — Progress Notes (Signed)
 [facility] PROGRESS NOTE   Grant Bowers ZOX:[id number] DOB: 1946-04-04 DOA: 06/14/2023  PCP: [name], MD  Brief History: 78 y.o. male with medical history significant of  type 2 diabetes mellitus, essential hypertension, mixed hyperlipidemia, low back pain (on oxycodone at home.), trifascicular block s/p PPM,  OSA on CPAP, GERD who presented to the ED emergency department due to worsening low back pain.  Imaging studies raised concern for discitis.  He was hospitalized for further management.    Consultants: Phone discussion with neurosurgery.  Infectious disease, cardiology/EP  Procedures:   TEE - ? Sessile MV vegetation.    TTE - 1. Left ventricular ejection fraction, by estimation, is 55 to 60%. The left ventricle has normal function.  2. Right ventricular systolic function is normal. The right ventricular size is normal.  3. A small pericardial effusion is present. The pericardial effusion is circumferential.  4. Mild mitral valve regurgitation. No evidence of mitral stenosis.  5. The aortic valve is tricuspid. There is mild calcification of the aortic valve. There is mild thickening of the aortic valve.  6. The inferior vena cava is normal in size with greater than 50% respiratory variability, suggesting right atrial pressure of 3 mmHg.  7. Limited echo     Subjective :   Patient in bed, appears comfortable, denies any headache, no fever, no chest pain or pressure, no shortness of breath , no abdominal pain. No focal weakness.  Mid and low back pain improved.  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 Case was discussed with neurosurgery who recommended MRI.  Patient has a pacemaker so he was transferred to [facility].  Informed by MRI tech that due to the type of pacemaker that he has along with  the position of the leads he is unfortunately unable to get MRI safely.,  Per neurosurgery no further imaging required.  In by ID on IV antibiotics, source likely to oral dentition for which she will require outpatient dental follow-up, TTE stable,TEE ? Sessile MV vegetation.  Continue him on supportive care with antibiotics, pain control, PT OT, case discussed with ID, repeat blood cultures on 06/18/2023, monitor results.    Also being followed by EP who are considering device extraction due likely on 06/21/2023, positive blood cultures and possible mitral valve vegetation, leads appear to be clean.  Defer management to EP team, for now appears like no reinsertion of pacemaker is planned.       Hyponatremia Stable to improved.  Hypokalemia Will be supplemented.  Magnesium was 2.0 yesterday.  Normocytic anemia No evidence of overt bleeding.  QT prolongation Correct electrolytes.  Repeat EKG showed improvement in QT prolongation.  Essential hypertension Noted to be on amlodipine and irbesartan.  Blood pressure is reasonably well-controlled.  History of BPH Continue alfuzosin.  History of hyperlipidemia Statin.  Recently diagnosed cholelithiasis Outpatient follow-up with general surgery.  Diabetes mellitus type 2 with hyperglycemia Metformin on hold.  Continue SSI.  HbA1c is 6.2.  Lab Results  Component Value Date   HGBA1C 6.2 04/17/2023   CBG (last 3)  Recent Labs    06/19/23 1552 06/19/23 1941 06/20/23 0749  GLUCAP 161* 174* 163*    DVT Prophylaxis: SCDs for now Code Status: Full code Family Communication: Discussed with patient and his wife bedside on 06/18/2023, over the phone on 06/19/2023 Disposition Plan: To be determined  Status is: Inpatient Remains inpatient appropriate because: Need for IV antibiotics      Medications: Scheduled:  alfuzosin  10 mg Oral QHS   amLODipine  5 mg Oral Daily   feeding supplement  237 mL Oral BID BM   feeding supplement  (GLUCERNA SHAKE)  237 mL Oral TID BM   heparin injection (subcutaneous)  5,000 Units Subcutaneous Q8H   insulin aspart  0-9 Units Subcutaneous TID WC   irbesartan  75 mg Oral Daily   lidocaine  2 patch Transdermal Q24H   LORazepam  1 mg Intravenous Once   polyethylene glycol  17 g Oral Daily   rosuvastatin  20 mg Oral Daily   senna-docusate  2 tablet Oral BID   sodium chloride flush  3 mL Intravenous Q12H   Continuous:  cefTRIAXone (ROCEPHIN)  IV 2 g (06/20/23 0839)   [id number] **OR** acetaminophen, HYDROmorphone (DILAUDID) injection, oxyCODONE, prochlorperazine  Objective:  Vital Signs  Vitals:   06/20/23 0000 06/20/23 0425 06/20/23 0749 06/20/23 0800  BP:  133/64 135/74 (!) 144/59  Pulse:  (!) 108 (!) 106 99  Resp: 16 18 15 17   Temp:  98.1 F (36.7 C) 98 F (36.7 C)   TempSrc:  Oral Oral   SpO2:  96% 92% 92%  Weight:      Height:        Intake/Output Summary (Last 24 hours) at 06/20/2023 1036 Last data filed at 06/20/2023 0500 Gross per 24 hour  Intake 360 ml  Output 800 ml  Net -440 ml   Filed Weights   06/14/23 1645  Weight: 80.4 kg    Exam  Awake Alert, No new F.N deficits, bilateral plantars downgoing [place].AT,PERRAL Supple Neck, No JVD,   Symmetrical Chest wall movement, Good air movement bilaterally, CTAB RRR,No Gallops, Rubs or new Murmurs,  +ve B.Sounds, Abd Soft, No tenderness,   No Cyanosis, Clubbing or edema     Lab Results:   Data Review:   Inpatient Medications  Scheduled Meds:  alfuzosin  10 mg Oral QHS   amLODipine  5 mg Oral Daily   feeding supplement  237 mL Oral BID BM   feeding supplement (GLUCERNA SHAKE)  237 mL Oral TID BM   heparin injection (subcutaneous)  5,000 Units Subcutaneous Q8H   insulin aspart  0-9 Units Subcutaneous TID WC   irbesartan  75 mg Oral Daily   lidocaine  2 patch Transdermal Q24H   LORazepam  1 mg Intravenous Once   polyethylene glycol  17 g Oral Daily   rosuvastatin  20 mg Oral Daily    senna-docusate  2 tablet Oral BID   sodium chloride flush  3 mL Intravenous Q12H   Continuous Infusions:  cefTRIAXone (ROCEPHIN)  IV 2 g (06/20/23 0839)   PRN Meds:.acetaminophen **OR** acetaminophen, HYDROmorphone (DILAUDID) injection, oxyCODONE, prochlorperazine  DVT Prophylaxis  heparin injection 5,000 Units Start: 06/18/23 1400 SCDs Start: 06/15/23 0359   Recent Labs  Lab 06/14/23 1725 06/15/23 0428 06/16/23 0554 06/17/23 0559 06/18/23 0531 06/19/23 0436 06/20/23 0443  WBC 15.2*   < > 10.6* 9.3 9.9 9.6 9.2  HGB 11.1*   < > 9.8* 9.0* 8.8* 8.4* 8.8*  HCT 33.9*   < > 30.1* 28.3* 27.3* 26.1* 26.0*  PLT 334   < > 274 275 285 286 310  MCV 81.5   < > 82.5 83.0 82.7 82.9 81.8  MCH 26.7   < > 26.8 26.4 26.7 26.7 27.7  MCHC 32.7   < > 32.6 31.8  32.2 32.2 33.8  RDW 14.7   < > 14.7 14.7 14.8 14.9 15.1  LYMPHSABS 0.3*  --   --  0.9 1.0 1.2 1.1  MONOABS 0.7  --   --  0.7 0.7 0.6 0.5  EOSABS 0.0  --   --  0.1 0.1 0.1 0.1  BASOSABS 0.0  --   --  0.0 0.0 0.0 0.0   < > = values in this interval not displayed.    Recent Labs  Lab 06/14/23 1725 06/14/23 1907 06/15/23 0428 06/15/23 0756 06/16/23 0554 06/17/23 0559 06/18/23 0531 06/19/23 0436 06/20/23 0443  NA 127*  --  132*  --  133* 135 136 136 134*  K 4.0  --  3.8  --  3.2* 3.9 4.3 3.6 3.5  CL 97*  --  102  --  102 103 107 105 108  CO2 17*  --  23  --  21* 22 23 20* 20*  ANIONGAP 13  --  7  --  10 10 6 11 6   GLUCOSE 287*  --  179*  --  158* 177* 156* 155* 160*  BUN 22  --  25*  --  22 17 15 20 17   CREATININE 1.44*  --  1.11  --  1.01 0.91 1.02 1.05 1.07  AST 20  --  14*  --  25 19 23 27  34  ALT 21  --  20  --  25 24 29  35 48*  ALKPHOS 84  --  69  --  74 70 64 59 66  BILITOT 1.2  --  0.8  --  0.7 0.8 0.7 0.7 0.7  ALBUMIN 2.5*  --  2.3*  --  2.2* 2.1* 2.1* 2.0* 2.0*  CRP  --   --   --   --   --  8.4* 9.3* 8.8* 7.8*  PROCALCITON  --   --   --   --   --  0.71 0.35 0.26 <0.10  LATICACIDVEN 3.1* 1.6  --  0.8  --   --   --    --   --   BNP  --   --   --   --   --  200.6* 191.4* 173.7* 349.9*  MG  --   --  2.0  --   --  2.1 2.0 2.0 1.9  PHOS  --   --  3.8  --   --   --  3.2 3.5 3.1  CALCIUM 8.4*  --  8.1*  --  8.2* 8.2* 8.3* 8.4* 8.2*      Recent Labs  Lab 06/14/23 1725 06/14/23 1907 06/15/23 0428 06/15/23 0756 06/16/23 0554 06/17/23 0559 06/18/23 0531 06/19/23 0436 06/20/23 0443  CRP  --   --   --   --   --  8.4* 9.3* 8.8* 7.8*  PROCALCITON  --   --   --   --   --  0.71 0.35 0.26 <0.10  LATICACIDVEN 3.1* 1.6  --  0.8  --   --   --   --   --   BNP  --   --   --   --   --  200.6* 191.4* 173.7* 349.9*  MG  --   --  2.0  --   --  2.1 2.0 2.0 1.9  CALCIUM 8.4*  --  8.1*  --  8.2* 8.2* 8.3* 8.4* 8.2*    --------------------------------------------------------------------------------------------------------------- Lab Results  Component Value Date   CHOL  96 (L) 05/03/2023   HDL 30 (L) 05/03/2023   LDLCALC 49 05/03/2023   LDLDIRECT 150.3 02/11/2010   TRIG 82 05/03/2023   CHOLHDL 3.2 05/03/2023    Lab Results  Component Value Date   HGBA1C 6.2 04/17/2023   Radiology Reports  No results found.     Signature  -   Susa Raring M.D on 06/20/2023 at 10:36 AM   -  To page go to [email or web address]

## 2023-06-20 NOTE — Plan of Care (Signed)

## 2023-06-20 NOTE — TOC Progression Note (Signed)
 Transition of Care [facility]) - Progression Note    Patient Details  Name: Grant Bowers MRN: [id number] Date of Birth: Jan 15, 1946  [facility]) CM/SW Contact  Gordy Clement, RN Phone Number: 06/20/2023, 11:39 AM  Clinical Narrative:     Patient was active with [facility] health in [place] and will resume services after DC. AVS has been updated.  TOC will continue to follow patient for any additional discharge needs       Expected Discharge Plan: Home/Self Care Barriers to Discharge: Continued Medical Work up  Expected Discharge Plan and Services In-house Referral: Clinical Social Work   Post Acute Care Choice: Home Health Living arrangements for the past 2 months: Single Family Home                           HH Arranged: PT HH Agency: [facility]         Social Determinants of Health (SDOH) Interventions SDOH Screenings   Food Insecurity: No Food Insecurity (06/15/2023)  Housing: Low Risk  (06/15/2023)  Transportation Needs: No Transportation Needs (06/15/2023)  Utilities: Not At Risk (06/15/2023)  Depression (PHQ2-9): Low Risk  (04/17/2023)  Social Connections: Moderately Isolated (06/15/2023)  Tobacco Use: Low Risk  (06/14/2023)    Readmission Risk Interventions    06/15/2023    1:42 PM  Readmission Risk Prevention Plan  Transportation Screening Complete  HRI or Home Care Consult Complete  Social Work Consult for Recovery Care Planning/Counseling Complete  Palliative Care Screening Not Applicable  Medication Review Oceanographer) Complete

## 2023-06-20 NOTE — Progress Notes (Signed)
  Patient Name: Grant Bowers Date of Encounter: 06/20/2023  Primary Cardiologist: Marjo Bicker, MD Electrophysiologist: Maurice Small, MD  Interval Summary   Viona Gilmore. Resting comfortably this morning. Reviewed extraction procedure with patient and his wife yesterday late afternoon. They are both in agreement to proceed.   Vital Signs    Vitals:   06/19/23 1941 06/19/23 2323 06/20/23 0000 06/20/23 0425  BP: (!) 143/58 (!) 147/67  133/64  Pulse: (!) 104 (!) 50  (!) 108  Resp: 16 14 16 18   Temp: 98.9 F (37.2 C) 98 F (36.7 C)  98.1 F (36.7 C)  TempSrc: Oral Oral  Oral  SpO2: 96% 98%  96%  Weight:      Height:        Intake/Output Summary (Last 24 hours) at 06/20/2023 0711 Last data filed at 06/20/2023 0500 Gross per 24 hour  Intake 460 ml  Output 900 ml  Net -440 ml   Filed Weights   06/14/23 1645  Weight: 80.4 kg    Physical Exam    GEN- The patient is well appearing, alert and oriented x 3 today.  Lungs- CTA bilaterally, normal work of breathing Cardiac- Regular rate and rhythm, no murmurs, rubs or gallops GI- soft, NT, ND, + BS Extremities- no clubbing or cyanosis. No edema  Telemetry    SR/ST at 90-100s (personally reviewed)  Hospital Course    Bernal Luhman is a 78 y.o. male history of T2DM, HTN, low back pain, trifascicular block s/p PPM, OSA on CPAP, GERD admitted for discitis, found to be bacteremic.   EP consulted for bacteremia with PPM in situ.   3/10 - device programmed to VVI 40 with 0% v pacing  Assessment & Plan    #) trifascicular block s/p St. Jude PPM #) Staph mitis/oralis bacteremic CT abd/pel concerning for vertebral discitis Blood cultures 2/2 positive for staph mitis/oralis TEE with negative lead vegetation Planning for lead extraction 3/13, leads implanted 2022 0% ventricular pacing at VVI 40 No plans for reimplantation at this time Continue abx as per ID    #) acute on chronic low back pain #) OSA on CPAP #)  T2DM #) HTN Mgmt per primary service       For questions or updates, please contact [facility] Please consult [email or web address] for contact info under Cardiology/STEMI.  Signed, Sherie Don, NP  06/20/2023, 7:11 AM

## 2023-06-20 NOTE — Progress Notes (Signed)
 Occupational Therapy Treatment Patient Details Name: Grant Bowers MRN: [id number] DOB: 05-11-1945 Today's Date: 06/20/2023   History of present illness Pt is 78 yo presenting to [facility] ED on 3/6 due to increased pain in LB with concerns for discitis. 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 TEE 3/10. PMH: DM II, HTN, mixed hyperlipidemia, LBP, trifascicular block s/p PPM, OSA on CPAP, GERD.   OT comments  With increased reassurance and time pt able to progress OOB today to pivot onto [facility], remains limited by bilat hip pain. Pt needing Mod A for bed mobility and transfers, reinforced mobility similar to  post op back precautions for comfort. Not able to educated pt on AE in sitting today as he uses his BLEs to brace for pain and hold balance prn, continues to need cues to breathe. OT to continue to progress pt as able, DC plans remain appropriate for SNF unless family felt comfortable with increased support and DME.       If plan is discharge home, recommend the following:  A lot of help with bathing/dressing/bathroom;Assistance with cooking/housework;Assist for transportation;Help with stairs or ramp for entrance;A little help with walking and/or transfers   Equipment Recommendations  BSC/3in1;Wheelchair (measurements OT);Wheelchair cushion (measurements OT);Other (comment);Hospital bed (RW)    Recommendations for Other Services      Precautions / Restrictions Precautions Precautions: Fall Restrictions Weight Bearing Restrictions Per Provider Order: No       Mobility Bed Mobility Overal bed mobility: Needs Assistance Bed Mobility: Rolling, Sidelying to Sit, Sit to Sidelying Rolling: Mod assist Sidelying to sit: HOB elevated, Mod assist     Sit to sidelying: Mod assist General bed mobility comments: Mod A to assist BLEs a little and to raise trunk into upright sitting. Re-educated pt on log rolling in/out of bed    Transfers Overall transfer level: Needs assistance Equipment  used: Rolling walker (2 wheels) Transfers: Sit to/from Stand Sit to Stand: Min assist, From elevated surface Stand pivot transfers: Mod assist         General transfer comment: mod A to pivot to/from BSC, BLEs begin to buckle-could be fatigue or more of a pain compensation maneuver     Balance Overall balance assessment: Needs assistance Sitting-balance support: Feet supported, Bilateral upper extremity supported Sitting balance-Leahy Scale: Fair Sitting balance - Comments: uncomfortable sitting EOB. pain in R hip making him a bit restless. BUE support against bed to brace self   Standing balance support: Bilateral upper extremity supported, During functional activity, Reliant on assistive device for balance Standing balance-Leahy Scale: Poor Standing balance comment: Reliant on RW                           ADL either performed or assessed with clinical judgement   ADL                   Upper Body Dressing : Minimal assistance;Sitting       Toilet Transfer: Minimal assistance;Stand-pivot;Rolling walker (2 wheels)   Toileting- Clothing Manipulation and Hygiene: Sit to/from stand;Maximal assistance       Functional mobility during ADLs: Minimal assistance;Rolling walker (2 wheels) General ADL Comments: Attempted to educated pt on seated ADLs using AE, too fidgety and pain limited to sit still with enough control while manipulating AE.    Extremity/Trunk Assessment              Vision       Perception  Praxis     Communication Communication Communication: No apparent difficulties   Cognition Arousal: Alert Behavior During Therapy: WFL for tasks assessed/performed Cognition: No apparent impairments                               Following commands: Intact        Cueing   Cueing Techniques: Verbal cues, Tactile cues  Exercises      Shoulder Instructions       General Comments Pt spouse present and supportive during  session    Pertinent Vitals/ Pain       Pain Assessment Pain Assessment: 0-10 Pain Score: 9  Pain Location: bilat hips. R>L Pain Descriptors / Indicators: Grimacing, Sharp, Sore, Shooting, Moaning Pain Intervention(s): Limited activity within patient's tolerance, Monitored during session, Premedicated before session, Repositioned  Home Living                                          Prior Functioning/Environment              Frequency  Min 1X/week        Progress Toward Goals  OT Goals(current goals can now be found in the care plan section)  Progress towards OT goals: Progressing toward goals  Acute Rehab OT Goals Patient Stated Goal: pain control OT Goal Formulation: With patient Time For Goal Achievement: 07/01/23 Potential to Achieve Goals: Good  Plan      Co-evaluation                 AM-PAC OT "6 Clicks" Daily Activity     Outcome Measure   Help from another person eating meals?: None Help from another person taking care of personal grooming?: A Little Help from another person toileting, which includes using toliet, bedpan, or urinal?: A Lot Help from another person bathing (including washing, rinsing, drying)?: A Lot Help from another person to put on and taking off regular upper body clothing?: A Little Help from another person to put on and taking off regular lower body clothing?: A Lot 6 Click Score: 16    End of Session Equipment Utilized During Treatment: Gait belt;Rolling walker (2 wheels)  OT Visit Diagnosis: Unsteadiness on feet (R26.81);Other abnormalities of gait and mobility (R26.89);Muscle weakness (generalized) (M62.81);Pain Pain - Right/Left:  (bilat, R>L) Pain - part of body: Hip   Activity Tolerance Patient limited by pain   Patient Left in bed;with call bell/phone within reach;with bed alarm set;with family/visitor present   Nurse Communication Mobility status        Time: 4098-1191 OT Time  Calculation (min): 34 min  Charges: OT General Charges $OT Visit: 1 Visit OT Treatments $Therapeutic Activity: 23-37 mins  06/20/2023  AB, OTR/L  Acute Rehabilitation Services  Office: [phone]   Tristan Schroeder 06/20/2023, 5:49 PM

## 2023-06-21 ENCOUNTER — Inpatient Hospital Stay (HOSPITAL_COMMUNITY)

## 2023-06-21 ENCOUNTER — Encounter (HOSPITAL_COMMUNITY)
Admission: EM | Disposition: A | Discharge status: Home Health | Payer: Self-pay | Source: Ambulatory Visit | Attending: Internal Medicine

## 2023-06-21 ENCOUNTER — Inpatient Hospital Stay (HOSPITAL_COMMUNITY): Payer: Self-pay | Admitting: Certified Registered Nurse Anesthetist

## 2023-06-21 ENCOUNTER — Other Ambulatory Visit: Payer: Self-pay

## 2023-06-21 ENCOUNTER — Encounter (HOSPITAL_COMMUNITY): Payer: Self-pay | Admitting: Internal Medicine

## 2023-06-21 DIAGNOSIS — M4626 Osteomyelitis of vertebra, lumbar region: Secondary | ICD-10-CM | POA: Diagnosis not present

## 2023-06-21 DIAGNOSIS — N189 Chronic kidney disease, unspecified: Secondary | ICD-10-CM | POA: Diagnosis not present

## 2023-06-21 DIAGNOSIS — I129 Hypertensive chronic kidney disease with stage 1 through stage 4 chronic kidney disease, or unspecified chronic kidney disease: Secondary | ICD-10-CM

## 2023-06-21 DIAGNOSIS — Z950 Presence of cardiac pacemaker: Secondary | ICD-10-CM | POA: Diagnosis not present

## 2023-06-21 DIAGNOSIS — E1122 Type 2 diabetes mellitus with diabetic chronic kidney disease: Secondary | ICD-10-CM | POA: Diagnosis not present

## 2023-06-21 DIAGNOSIS — T827XXA Infection and inflammatory reaction due to other cardiac and vascular devices, implants and grafts, initial encounter: Secondary | ICD-10-CM | POA: Diagnosis not present

## 2023-06-21 DIAGNOSIS — R7881 Bacteremia: Secondary | ICD-10-CM

## 2023-06-21 DIAGNOSIS — B9689 Other specified bacterial agents as the cause of diseases classified elsewhere: Secondary | ICD-10-CM | POA: Diagnosis not present

## 2023-06-21 DIAGNOSIS — M4620 Osteomyelitis of vertebra, site unspecified: Secondary | ICD-10-CM | POA: Diagnosis not present

## 2023-06-21 HISTORY — PX: PPM GENERATOR REMOVAL: EP1234

## 2023-06-21 HISTORY — PX: LEAD EXTRACTION: EP1211

## 2023-06-21 LAB — GLUCOSE, CAPILLARY
Glucose-Capillary: 155 mg/dL — ABNORMAL HIGH (ref 70–99)
Glucose-Capillary: 135 mg/dL — ABNORMAL HIGH (ref 70–99)
Glucose-Capillary: 136 mg/dL — ABNORMAL HIGH (ref 70–99)
Glucose-Capillary: 160 mg/dL — ABNORMAL HIGH (ref 70–99)
Glucose-Capillary: 174 mg/dL — ABNORMAL HIGH (ref 70–99)
Glucose-Capillary: 230 mg/dL — ABNORMAL HIGH (ref 70–99)

## 2023-06-21 LAB — CBC WITH DIFFERENTIAL/PLATELET
Abs Immature Granulocytes: 0.04 10*3/uL (ref 0.00–0.07)
Basophils Absolute: 0 10*3/uL (ref 0.0–0.1)
Basophils Relative: 1 %
Eosinophils Absolute: 0.2 10*3/uL (ref 0.0–0.5)
Eosinophils Relative: 2 %
HCT: 27.9 % — ABNORMAL LOW (ref 39.0–52.0)
Hemoglobin: 9.2 g/dL — ABNORMAL LOW (ref 13.0–17.0)
Immature Granulocytes: 1 %
Lymphocytes Relative: 13 %
Lymphs Abs: 1.1 10*3/uL (ref 0.7–4.0)
MCH: 27.1 pg (ref 26.0–34.0)
MCHC: 33 g/dL (ref 30.0–36.0)
MCV: 82.3 fL (ref 80.0–100.0)
Monocytes Absolute: 0.5 10*3/uL (ref 0.1–1.0)
Monocytes Relative: 6 %
Neutro Abs: 6.3 10*3/uL (ref 1.7–7.7)
Neutrophils Relative %: 77 %
Platelets: 299 10*3/uL (ref 150–400)
RBC: 3.39 MIL/uL — ABNORMAL LOW (ref 4.22–5.81)
RDW: 15.2 % (ref 11.5–15.5)
WBC: 8.1 10*3/uL (ref 4.0–10.5)
nRBC: 0 % (ref 0.0–0.2)

## 2023-06-21 LAB — COMPREHENSIVE METABOLIC PANEL
ALT: 51 U/L — ABNORMAL HIGH (ref 0–44)
AST: 33 U/L (ref 15–41)
Albumin: 2 g/dL — ABNORMAL LOW (ref 3.5–5.0)
Alkaline Phosphatase: 69 U/L (ref 38–126)
Anion gap: 7 (ref 5–15)
BUN: 13 mg/dL (ref 8–23)
CO2: 20 mmol/L — ABNORMAL LOW (ref 22–32)
Calcium: 8.4 mg/dL — ABNORMAL LOW (ref 8.9–10.3)
Chloride: 110 mmol/L (ref 98–111)
Creatinine, Ser: 0.8 mg/dL (ref 0.61–1.24)
GFR, Estimated: >60 mL/min (ref >60)
Glucose, Bld: 152 mg/dL — ABNORMAL HIGH (ref 70–99)
Potassium: 3.8 mmol/L (ref 3.5–5.1)
Sodium: 137 mmol/L (ref 135–145)
Total Bilirubin: 0.6 mg/dL (ref 0.0–1.2)
Total Protein: 5.1 g/dL — ABNORMAL LOW (ref 6.5–8.1)

## 2023-06-21 LAB — CULTURE, BLOOD (ROUTINE X 2)
Culture: NO GROWTH
Culture: NO GROWTH
Culture: NO GROWTH
Culture: NO GROWTH
Special Requests: ADEQUATE
Special Requests: ADEQUATE

## 2023-06-21 LAB — BRAIN NATRIURETIC PEPTIDE: B Natriuretic Peptide: 323.5 pg/mL — ABNORMAL HIGH (ref 0.0–100.0)

## 2023-06-21 LAB — MAGNESIUM: Magnesium: 2 mg/dL (ref 1.7–2.4)

## 2023-06-21 LAB — C-REACTIVE PROTEIN: CRP: 6.4 mg/dL — ABNORMAL HIGH (ref <1.0)

## 2023-06-21 LAB — ECHO INTRAOPERATIVE TEE
Height: 72 in
Weight: 2836 [oz_av]

## 2023-06-21 LAB — PROCALCITONIN: Procalcitonin: <0.1 ng/mL

## 2023-06-21 LAB — PHOSPHORUS: Phosphorus: 3 mg/dL (ref 2.5–4.6)

## 2023-06-21 SURGERY — LEAD EXTRACTION
Anesthesia: General

## 2023-06-21 MED ORDER — SODIUM CHLORIDE 0.9 % IV SOLN: 250.0000 mL | INTRAVENOUS | Status: DC

## 2023-06-21 MED ORDER — FENTANYL CITRATE (PF) 100 MCG/2ML IJ SOLN
INTRAMUSCULAR | Status: AC
  Filled 2023-06-21: qty 2

## 2023-06-21 MED ORDER — LIDOCAINE 2% (20 MG/ML) 5 ML SYRINGE
INTRAMUSCULAR | Status: DC | PRN
  Administered 2023-06-21: 60 mg via INTRAVENOUS

## 2023-06-21 MED ORDER — ONDANSETRON HCL 4 MG/2ML IJ SOLN
INTRAMUSCULAR | Status: DC | PRN
  Administered 2023-06-21: 4 mg via INTRAVENOUS

## 2023-06-21 MED ORDER — DEXAMETHASONE SODIUM PHOSPHATE 10 MG/ML IJ SOLN
INTRAMUSCULAR | Status: DC | PRN
  Administered 2023-06-21: 8 mg via INTRAVENOUS

## 2023-06-21 MED ORDER — CEFAZOLIN SODIUM-DEXTROSE 2-4 GM/100ML-% IV SOLN
INTRAVENOUS | Status: AC
Start: 2023-06-21 — End: 2023-06-21
  Filled 2023-06-21: qty 100

## 2023-06-21 MED ORDER — CHLORHEXIDINE GLUCONATE 0.12 % MT SOLN
OROMUCOSAL | Status: AC
  Administered 2023-06-21: 15 mL
  Filled 2023-06-21: qty 15

## 2023-06-21 MED ORDER — SODIUM CHLORIDE 0.9 % IV SOLN: INTRAVENOUS | Status: DC

## 2023-06-21 MED ORDER — SODIUM CHLORIDE 0.9 % IV SOLN: INTRAVENOUS | Status: DC | PRN

## 2023-06-21 MED ORDER — PHENYLEPHRINE HCL-NACL 20-0.9 MG/250ML-% IV SOLN
INTRAVENOUS | Status: DC | PRN
  Administered 2023-06-21: 40 ug/min via INTRAVENOUS

## 2023-06-21 MED ORDER — ROCURONIUM BROMIDE 10 MG/ML (PF) SYRINGE
PREFILLED_SYRINGE | INTRAVENOUS | Status: DC | PRN
  Administered 2023-06-21: 50 mg via INTRAVENOUS
  Administered 2023-06-21: 10 mg via INTRAVENOUS
  Administered 2023-06-21: 20 mg via INTRAVENOUS

## 2023-06-21 MED ORDER — CEFAZOLIN SODIUM-DEXTROSE 2-4 GM/100ML-% IV SOLN
2.0000 g | INTRAVENOUS | Status: AC
  Administered 2023-06-21: 2 g via INTRAVENOUS
  Filled 2023-06-21: qty 100

## 2023-06-21 MED ORDER — CEFTRIAXONE IV (FOR PTA / DISCHARGE USE ONLY): 2.0000 g | INTRAVENOUS | 0 refills | Status: DC

## 2023-06-21 MED ORDER — SUGAMMADEX SODIUM 200 MG/2ML IV SOLN
INTRAVENOUS | Status: DC | PRN
  Administered 2023-06-21: 200 mg via INTRAVENOUS

## 2023-06-21 MED ORDER — HEPARIN (PORCINE) IN NACL 1000-0.9 UT/500ML-% IV SOLN
INTRAVENOUS | Status: DC | PRN
  Administered 2023-06-21: 500 mL

## 2023-06-21 MED ORDER — PROPOFOL 10 MG/ML IV BOLUS
INTRAVENOUS | Status: DC | PRN
  Administered 2023-06-21: 30 mg via INTRAVENOUS
  Administered 2023-06-21: 120 mg via INTRAVENOUS

## 2023-06-21 MED ORDER — FENTANYL CITRATE (PF) 100 MCG/2ML IJ SOLN
INTRAMUSCULAR | Status: DC | PRN
Start: 2023-06-21 — End: 2023-06-21
  Administered 2023-06-21: 25 ug via INTRAVENOUS
  Administered 2023-06-21: 100 ug via INTRAVENOUS
  Administered 2023-06-21: 25 ug via INTRAVENOUS

## 2023-06-21 MED ORDER — MIDAZOLAM HCL 2 MG/2ML IJ SOLN
INTRAMUSCULAR | Status: DC | PRN
  Administered 2023-06-21: 2 mg via INTRAVENOUS

## 2023-06-21 MED ORDER — SODIUM CHLORIDE 0.9 % IV SOLN
80.0000 mg | INTRAVENOUS | Status: AC
  Filled 2023-06-21: qty 2

## 2023-06-21 MED ORDER — INSULIN ASPART 100 UNIT/ML IJ SOLN
0.0000 [IU] | INTRAMUSCULAR | Status: DC | PRN
Start: 2023-06-21 — End: 2023-06-21

## 2023-06-21 MED ORDER — SODIUM CHLORIDE 0.9% IV SOLUTION: Freq: Once | INTRAVENOUS | Status: DC

## 2023-06-21 MED ORDER — MIDAZOLAM HCL 2 MG/2ML IJ SOLN
INTRAMUSCULAR | Status: AC
  Filled 2023-06-21: qty 2

## 2023-06-21 MED ORDER — SODIUM CHLORIDE 0.9 % IV SOLN
INTRAVENOUS | Status: AC
  Administered 2023-06-21: 80 mg
  Filled 2023-06-21: qty 2

## 2023-06-21 MED ORDER — LACTATED RINGERS IV SOLN: INTRAVENOUS | Status: DC

## 2023-06-21 MED ORDER — SODIUM CHLORIDE 0.9% FLUSH: 3.0000 mL | INTRAVENOUS | Status: DC | PRN

## 2023-06-21 SURGICAL SUPPLY — 7 items
BRIDGE PREP KIT (KITS) ×1 IMPLANT
CABLE SURGICAL S-101-97-12 (CABLE) ×1 IMPLANT
KIT BRIDGE PREP (KITS) IMPLANT
PAD DEFIB RADIO PHYSIO CONN (PAD) ×1 IMPLANT
SHEATH LASER 14 FR GLIDELIGHT (SHEATH) IMPLANT
SHEATH PROBE COVER 6X72 (BAG) IMPLANT
TRAY PACEMAKER INSERTION (PACKS) ×1 IMPLANT

## 2023-06-21 NOTE — Progress Notes (Signed)
     [address]       [place] [address]             [phone]       78 yo with osteomylitis of vertebral body with bacteremia and implanted PPM. EP feels needs to have it removed. They request surgical back up in case of venous/cardiac injury. After clinic concludes today, I will then be available.

## 2023-06-21 NOTE — Progress Notes (Signed)
 [facility]  Date of Admission:  06/14/2023   Total days of inpatient antibiotics 7  Principal Problem:   Vertebral osteomyelitis (HCC) Active Problems:   Essential hypertension, benign   Prolonged QT interval   Mixed hyperlipidemia   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)   Type 2 diabetes mellitus with hyperglycemia (HCC)   BPH (benign prostatic hyperplasia)   Hyponatremia   Lactic acidosis   Streptococcal endocarditis   Non-rheumatic mitral regurgitation          Assessment:  78 year old male with diabetes, HTN, trifascicular block status post PPM admitted with worsening back pain found to have:   #Strep mitis/oralis bacteremia with concern for native MV IE #PPM implanted 07/09/20 #Concern for vertebral osteomyelitis - On arrival he had worsening back pain he was sent from pain clinic over the ED.  He had WBC 15K.  Blood cultures grew 2/2 sets strep mitis/oralis.  Transition from Vanco pip-tazo to ceftriaxone. - CT abdomen pelvis shows concern for osteomyelitis at L4.  Given he has pacemaker not compatible MRI.  Forego MRI at this time.  IR aspiration likely not necessary given is probably the same organism as in the blood. -Pt states back pain has been ongoing for months.  -TEE noted sessile mitral valve veg in anterior leaflet, severe MR. Recommendations:  - Continue ceftriaxone -Plan on PPM extraction with EP given mitral valve findings and bacteremia today -Place PICC after PPM extraction. repeat blood Cx from 3/8 NG -. Plan on ctx x 6  weeks followed by 2 weeks of cefadroxil to complete 8 weeks of abx for vertebral infection which will cover endocarditis -Needs to see dentistry outpatient given poor dentition and peripheral lucency with molar decay -Given severe MR, please discuss with CVTS if mitral repair an option  OPAT ORDERS:  Diagnosis: Strep mitis/oralis IE  withPPM  No Known Allergies   Discharge  antibiotics to be given via PICC line:  Per pharmacy protocol ceftriaxon 2gm q24h   Duration: 6 weeks End Date: 07/28/23  [facility] Care Per Protocol with Biopatch Use: Home health RN for IV administration and teaching, line care and labs.    Labs weekly while on IV antibiotics: _x_ CBC with differential __ BMP **TWICE WEEKLY ON VANCOMYCIN  _x_ CMP x__ CRP x__ ESR __ Vancomycin trough TWICE WEEKLY __ CK  __ Please pull PIC at completion of IV antibiotics __ Please leave PIC in place until doctor has seen patient or been notified  Fax weekly labs to [phone]  Clinic Follow Up Appt: 4/3  @ [facility] with Dr. Thedore Mins  Evaluation of this patient requires complex antimicrobial therapy evaluation and counseling + isolation needs for disease transmission risk assessment and mitigation   Evaluation of this patient requires complex antimicrobial therapy evaluation and counseling + isolation needs for disease transmission risk assessment and mitigation    Microbiology:   Antibiotics: Pip-tazo 3/6 vanc 3/6 Ceftriaxone 3/7-   Cultures: Blood 3/6 s2/2 strep mitis/oralis Urine 3/6 insignificant growth   SUBJECTIVE: Resting in bed Interval: Afebirle overingiht  Review of Systems: Review of Systems  All other systems reviewed and are negative.    Scheduled Meds:  sodium chloride   Intravenous Once   [MAR Hold] alfuzosin  10 mg Oral QHS   [MAR Hold] amLODipine  5 mg Oral Daily   chlorhexidine  60 mL Topical Once   chlorhexidine  60 mL Topical Once   [MAR Hold] feeding supplement  237 mL Oral BID BM   [MAR Hold] feeding supplement (GLUCERNA SHAKE)  237 mL Oral TID BM   gentamicin (GARAMYCIN) 80 mg in sodium chloride 0.9 % 500 mL irrigation  80 mg Irrigation On Call   [MAR Hold] insulin aspart  0-9 Units Subcutaneous TID WC   [MAR Hold] irbesartan  75 mg Oral Daily   [MAR Hold] lidocaine  2 patch Transdermal Q24H   [MAR Hold] LORazepam  1 mg Intravenous Once   [MAR Hold]  polyethylene glycol  17 g Oral Daily   [MAR Hold] rosuvastatin  20 mg Oral Daily   [MAR Hold] senna-docusate  2 tablet Oral BID   [MAR Hold] sodium chloride flush  3 mL Intravenous Q12H   Continuous Infusions:  sodium chloride     sodium chloride      ceFAZolin (ANCEF) IV     [MAR Hold] cefTRIAXone (ROCEPHIN)  IV 2 g (06/21/23 0824)   PRN Meds:.[MAR Hold] acetaminophen **OR** [MAR Hold] acetaminophen, [MAR Hold]  HYDROmorphone (DILAUDID) injection, [MAR Hold] oxyCODONE, [MAR Hold] prochlorperazine, sodium chloride flush No Known Allergies  OBJECTIVE: Vitals:   06/20/23 2340 06/21/23 0424 06/21/23 0751 06/21/23 1155  BP: 135/68 134/71    Pulse:      Resp: 18 13    Temp: 98.7 F (37.1 C) 97.6 F (36.4 C) (!) 97.4 F (36.3 C) 98.6 F (37 C)  TempSrc: Oral Oral Oral Oral  SpO2:      Weight:      Height:       Body mass index is 24.04 kg/m.  Physical Exam Constitutional:      General: He is not in acute distress.    Appearance: He is normal weight. He is not toxic-appearing.  HENT:     Head: Normocephalic and atraumatic.     Right Ear: External ear normal.     Left Ear: External ear normal.     Nose: No congestion or rhinorrhea.     Mouth/Throat:     Mouth: Mucous membranes are moist.     Pharynx: Oropharynx is clear.  Eyes:     Extraocular Movements: Extraocular movements intact.     Conjunctiva/sclera: Conjunctivae normal.     Pupils: Pupils are equal, round, and reactive to light.  Cardiovascular:     Rate and Rhythm: Normal rate and regular rhythm.     Heart sounds: No murmur heard.    No friction rub. No gallop.  Pulmonary:     Effort: Pulmonary effort is normal.     Breath sounds: Normal breath sounds.  Abdominal:     General: Abdomen is flat. Bowel sounds are normal.     Palpations: Abdomen is soft.  Musculoskeletal:        General: No swelling. Normal range of motion.     Cervical back: Normal range of motion and neck supple.  Skin:    General: Skin  is warm and dry.  Neurological:     General: No focal deficit present.     Mental Status: He is oriented to person, place, and time.  Psychiatric:        Mood and Affect: Mood normal.       Lab Results Lab Results  Component Value Date   WBC 8.1 06/21/2023   HGB 9.2 (L) 06/21/2023   HCT 27.9 (L) 06/21/2023   MCV 82.3 06/21/2023   PLT 299 06/21/2023    Lab Results  Component Value Date   CREATININE 0.80 06/21/2023   BUN 13  06/21/2023   NA 137 06/21/2023   K 3.8 06/21/2023   CL 110 06/21/2023   CO2 20 (L) 06/21/2023    Lab Results  Component Value Date   ALT 51 (H) 06/21/2023   AST 33 06/21/2023   ALKPHOS 69 06/21/2023   BILITOT 0.6 06/21/2023        Danelle Earthly, MD [facility] 06/21/2023, 1:13 PM

## 2023-06-21 NOTE — Progress Notes (Signed)
  Echocardiogram Echocardiogram Transesophageal has been performed.  Delcie Roch 06/21/2023, 5:35 PM

## 2023-06-21 NOTE — Progress Notes (Signed)
  Patient Name: Grant Bowers Date of Encounter: 06/21/2023  Primary Cardiologist: Marjo Bicker, MD Electrophysiologist: Maurice Small, MD  Interval Summary   Viona Gilmore.  Continues to have significant R leg/hip pain with slight movements.  NPO for extraction this afternoon.   Vital Signs    Vitals:   06/20/23 2129 06/20/23 2340 06/21/23 0424 06/21/23 0751  BP: (!) 145/74 135/68 134/71   Pulse:      Resp: 14 18 13    Temp: 98.5 F (36.9 C) 98.7 F (37.1 C) 97.6 F (36.4 C) (!) (P) 97.4 F (36.3 C)  TempSrc: Oral Oral Oral (P) Oral  SpO2:      Weight:      Height:        Intake/Output Summary (Last 24 hours) at 06/21/2023 0850 Last data filed at 06/20/2023 1300 Gross per 24 hour  Intake 740 ml  Output --  Net 740 ml   Filed Weights   06/14/23 1645  Weight: 80.4 kg    Physical Exam    GEN- The patient is well appearing, alert and oriented x 3 today.  Lungs- CTA bilaterally, normal work of breathing Cardiac- Regular rate and rhythm, no murmurs, rubs or gallops GI- soft, NT, ND, + BS Extremities- no clubbing or cyanosis. No edema  Telemetry    SR/ST w PACs at 90-100s No V pacing  (personally reviewed)  Hospital Course    Buren Havey is a 78 y.o. male history of T2DM, HTN, low back pain, trifascicular block s/p PPM, OSA on CPAP, GERD admitted for discitis, found to be bacteremic.  EP consulted for bacteremia with PPM in situ.   3/10 - device programmed to VVI 40 with 0% v pacing  Assessment & Plan    #) trifascicular block s/p [employer] PPM #) Staph mitis/oralis bacteremic CT abd/pel concerning for vertebral discitis Blood cultures 2/2 positive for staph mitis/oralis TEE with negative lead vegetation, though with possible small sessile MV veg Lead extraction planned for this afternoon, NPO for procedure No plans for reimplantation at this time Continue abx as per ID    #) acute on chronic low back pain #) OSA on CPAP #) T2DM #) HTN Mgmt  per primary service       For questions or updates, please contact [facility] Please consult [email or web address] for contact info under Cardiology/STEMI.  Signed, Sherie Don, NP  06/21/2023, 8:50 AM

## 2023-06-21 NOTE — Anesthesia Procedure Notes (Signed)
 Procedure Name: Intubation Date/Time: 06/21/2023 2:27 PM  Performed by: Randon Goldsmith, CRNAPre-anesthesia Checklist: Patient identified, Emergency Drugs available, Suction available and Patient being monitored Patient Re-evaluated:Patient Re-evaluated prior to induction Oxygen Delivery Method: Circle system utilized Preoxygenation: Pre-oxygenation with 100% oxygen Induction Type: IV induction Ventilation: Mask ventilation without difficulty and Oral airway inserted - appropriate to patient size Laryngoscope Size: 4 and Glidescope Grade View: Grade I Tube type: Oral Tube size: 8.0 mm Number of attempts: 2 Airway Equipment and Method: Stylet and Oral airway Placement Confirmation: ETT inserted through vocal cords under direct vision, positive ETCO2 and breath sounds checked- equal and bilateral Secured at: 23 cm Tube secured with: Tape Dental Injury: Teeth and Oropharynx as per pre-operative assessment  Comments: DL x1 Mac 4, grade 2b view, unable to pass ETT through cords; DL x2 Glide 4, grade 1 view, ETT gently passed through cords.

## 2023-06-21 NOTE — Progress Notes (Signed)
 [facility] PROGRESS NOTE   Grant Bowers ZOX:[id number] DOB: 1946-01-13 DOA: 06/14/2023  PCP: [name], MD  Brief History: 78 y.o. male with medical history significant of  type 2 diabetes mellitus, essential hypertension, mixed hyperlipidemia, low back pain (on oxycodone at home.), trifascicular block s/p PPM,  OSA on CPAP, GERD who presented to the ED emergency department due to worsening low back pain.  Imaging studies raised concern for discitis.  He was hospitalized for further management.    Consultants: Phone discussion with neurosurgery.  Infectious disease, cardiology/EP  Procedures:   TEE - ? Sessile MV vegetation.    TTE - 1. Left ventricular ejection fraction, by estimation, is 55 to 60%. The left ventricle has normal function.  2. Right ventricular systolic function is normal. The right ventricular size is normal.  3. A small pericardial effusion is present. The pericardial effusion is circumferential.  4. Mild mitral valve regurgitation. No evidence of mitral stenosis.  5. The aortic valve is tricuspid. There is mild calcification of the aortic valve. There is mild thickening of the aortic valve.  6. The inferior vena cava is normal in size with greater than 50% respiratory variability, suggesting right atrial pressure of 3 mmHg.  7. Limited echo     Subjective :   Patient in bed, appears comfortable, denies any headache, no fever, no chest pain or pressure, no shortness of breath , no abdominal pain. No new focal weakness. Mid and low back pain improved.  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 Case was discussed with neurosurgery who recommended MRI.  Patient has a pacemaker so he was transferred to [facility]  Informed by MRI tech that due to the type of pacemaker that he has along  with the position of the leads he is unfortunately unable to get MRI safely.,  Per neurosurgery no further imaging required.  In by ID on IV antibiotics, source likely to oral dentition for which she will require outpatient dental follow-up, TTE stable,TEE ? Sessile MV vegetation.  Continue him on supportive care with antibiotics, pain control, PT OT, case discussed with ID, repeat blood cultures on 06/18/2023, monitor results.    Also being followed by EP who are considering device extraction due likely on 06/21/2023, positive blood cultures and possible mitral valve vegetation, leads appear to be clean.  Defer management to EP team, for now appears like no reinsertion of pacemaker is planned.       Hyponatremia Stable to improved.  Hypokalemia Will be supplemented.  Magnesium was 2.0 yesterday.  Normocytic anemia No evidence of overt bleeding.  QT prolongation Correct electrolytes.  Repeat EKG showed improvement in QT prolongation.  Essential hypertension Noted to be on amlodipine and irbesartan.  Blood pressure is reasonably well-controlled.  History of BPH Continue alfuzosin.  History of hyperlipidemia Statin.  Recently diagnosed cholelithiasis Outpatient follow-up with general surgery.  Diabetes mellitus type 2 with hyperglycemia Metformin on hold.  Continue SSI.  HbA1c is 6.2.  Lab Results  Component Value Date   HGBA1C 6.2 04/17/2023   CBG (last 3)  Recent Labs    06/20/23 1639 06/20/23 2127 06/21/23 0757  GLUCAP 154* 122* 155*    DVT Prophylaxis: SCDs for now Code Status: Full code Family Communication: Discussed with patient and his wife bedside on 06/18/2023, over the phone on 06/19/2023, 06/20/23, 06/21/22 Disposition Plan: To  be determined  Status is: Inpatient Remains inpatient appropriate because: Need for IV antibiotics  Medications: Scheduled:  sodium chloride   Intravenous Once   alfuzosin  10 mg Oral QHS   amLODipine  5 mg Oral Daily    chlorhexidine  60 mL Topical Once   chlorhexidine  60 mL Topical Once   feeding supplement  237 mL Oral BID BM   feeding supplement (GLUCERNA SHAKE)  237 mL Oral TID BM   gentamicin (GARAMYCIN) 80 mg in sodium chloride 0.9 % 500 mL irrigation  80 mg Irrigation On Call   heparin injection (subcutaneous)  5,000 Units Subcutaneous Q8H   insulin aspart  0-9 Units Subcutaneous TID WC   irbesartan  75 mg Oral Daily   lidocaine  2 patch Transdermal Q24H   LORazepam  1 mg Intravenous Once   polyethylene glycol  17 g Oral Daily   rosuvastatin  20 mg Oral Daily   senna-docusate  2 tablet Oral BID   sodium chloride flush  3 mL Intravenous Q12H   Continuous:  sodium chloride     sodium chloride      ceFAZolin (ANCEF) IV     cefTRIAXone (ROCEPHIN)  IV 2 g (06/21/23 0824)   ZOX:[id number] **OR** acetaminophen, HYDROmorphone (DILAUDID) injection, oxyCODONE, prochlorperazine, sodium chloride flush  Objective:  Vital Signs  Vitals:   06/20/23 2129 06/20/23 2340 06/21/23 0424 06/21/23 0751  BP: (!) 145/74 135/68 134/71   Pulse:      Resp: 14 18 13    Temp: 98.5 F (36.9 C) 98.7 F (37.1 C) 97.6 F (36.4 C) (!) (P) 97.4 F (36.3 C)  TempSrc: Oral Oral Oral (P) Oral  SpO2:      Weight:      Height:        Intake/Output Summary (Last 24 hours) at 06/21/2023 1015 Last data filed at 06/20/2023 1300 Gross per 24 hour  Intake 740 ml  Output --  Net 740 ml   Filed Weights   06/14/23 1645  Weight: 80.4 kg    Exam  Awake Alert, No new F.N deficits, bilateral plantars downgoing [place].AT,PERRAL Supple Neck, No JVD,   Symmetrical Chest wall movement, Good air movement bilaterally, CTAB RRR,No Gallops, Rubs or new Murmurs,  +ve B.Sounds, Abd Soft, No tenderness,   No Cyanosis, Clubbing or edema    Lab Results:   Data Review:   Inpatient Medications  Scheduled Meds:  sodium chloride   Intravenous Once   alfuzosin  10 mg Oral QHS   amLODipine  5 mg Oral Daily   chlorhexidine   60 mL Topical Once   chlorhexidine  60 mL Topical Once   feeding supplement  237 mL Oral BID BM   feeding supplement (GLUCERNA SHAKE)  237 mL Oral TID BM   gentamicin (GARAMYCIN) 80 mg in sodium chloride 0.9 % 500 mL irrigation  80 mg Irrigation On Call   heparin injection (subcutaneous)  5,000 Units Subcutaneous Q8H   insulin aspart  0-9 Units Subcutaneous TID WC   irbesartan  75 mg Oral Daily   lidocaine  2 patch Transdermal Q24H   LORazepam  1 mg Intravenous Once   polyethylene glycol  17 g Oral Daily   rosuvastatin  20 mg Oral Daily   senna-docusate  2 tablet Oral BID   sodium chloride flush  3 mL Intravenous Q12H   Continuous Infusions:  sodium chloride     sodium chloride      ceFAZolin (ANCEF) IV  cefTRIAXone (ROCEPHIN)  IV 2 g (06/21/23 0824)   PRN Meds:.acetaminophen **OR** acetaminophen, HYDROmorphone (DILAUDID) injection, oxyCODONE, prochlorperazine, sodium chloride flush  DVT Prophylaxis  heparin injection 5,000 Units Start: 06/18/23 1400 SCDs Start: 06/15/23 0359   Recent Labs  Lab 06/17/23 0559 06/18/23 0531 06/19/23 0436 06/20/23 0443 06/21/23 0508  WBC 9.3 9.9 9.6 9.2 8.1  HGB 9.0* 8.8* 8.4* 8.8* 9.2*  HCT 28.3* 27.3* 26.1* 26.0* 27.9*  PLT 275 285 286 310 299  MCV 83.0 82.7 82.9 81.8 82.3  MCH 26.4 26.7 26.7 27.7 27.1  MCHC 31.8 32.2 32.2 33.8 33.0  RDW 14.7 14.8 14.9 15.1 15.2  LYMPHSABS 0.9 1.0 1.2 1.1 1.1  MONOABS 0.7 0.7 0.6 0.5 0.5  EOSABS 0.1 0.1 0.1 0.1 0.2  BASOSABS 0.0 0.0 0.0 0.0 0.0    Recent Labs  Lab 06/14/23 1725 06/14/23 1725 06/14/23 1907 06/15/23 0428 06/15/23 0756 06/16/23 0554 06/17/23 0559 06/18/23 0531 06/19/23 0436 06/20/23 0443 06/21/23 0508  NA 127*  --   --  132*  --    < > 135 136 136 134* 137  K 4.0  --   --  3.8  --    < > 3.9 4.3 3.6 3.5 3.8  CL 97*  --   --  102  --    < > 103 107 105 108 110  CO2 17*  --   --  23  --    < > 22 23 20* 20* 20*  ANIONGAP 13  --   --  7  --    < > 10 6 11 6 7   GLUCOSE  287*  --   --  179*  --    < > 177* 156* 155* 160* 152*  BUN 22  --   --  25*  --    < > 17 15 20 17 13   CREATININE 1.44*  --   --  1.11  --    < > 0.91 1.02 1.05 1.07 0.80  AST 20  --   --  14*  --    < > 19 23 27  34 33  ALT 21  --   --  20  --    < > 24 29 35 48* 51*  ALKPHOS 84  --   --  69  --    < > 70 64 59 66 69  BILITOT 1.2  --   --  0.8  --    < > 0.8 0.7 0.7 0.7 0.6  ALBUMIN 2.5*  --   --  2.3*  --    < > 2.1* 2.1* 2.0* 2.0* 2.0*  CRP  --   --   --   --   --   --  8.4* 9.3* 8.8* 7.8* 6.4*  PROCALCITON  --   --   --   --   --   --  0.71 0.35 0.26 <0.10 <0.10  LATICACIDVEN 3.1*  --  1.6  --  0.8  --   --   --   --   --   --   BNP  --   --   --   --   --   --  200.6* 191.4* 173.7* 349.9* 323.5*  MG  --    < >  --  2.0  --   --  2.1 2.0 2.0 1.9 2.0  PHOS  --   --   --  3.8  --   --   --  3.2 3.5 3.1 3.0  CALCIUM 8.4*  --   --  8.1*  --    < > 8.2* 8.3* 8.4* 8.2* 8.4*   < > = values in this interval not displayed.      Recent Labs  Lab 06/14/23 1725 06/14/23 1907 06/15/23 0428 06/15/23 0756 06/16/23 0554 06/17/23 0559 06/18/23 0531 06/19/23 0436 06/20/23 0443 06/21/23 0508  CRP  --   --   --   --   --  8.4* 9.3* 8.8* 7.8* 6.4*  PROCALCITON  --   --   --   --   --  0.71 0.35 0.26 <0.10 <0.10  LATICACIDVEN 3.1* 1.6  --  0.8  --   --   --   --   --   --   BNP  --   --   --   --   --  200.6* 191.4* 173.7* 349.9* 323.5*  MG  --   --    < >  --   --  2.1 2.0 2.0 1.9 2.0  CALCIUM 8.4*  --    < >  --    < > 8.2* 8.3* 8.4* 8.2* 8.4*   < > = values in this interval not displayed.    --------------------------------------------------------------------------------------------------------------- Lab Results  Component Value Date   CHOL 96 (L) 05/03/2023   HDL 30 (L) 05/03/2023   LDLCALC 49 05/03/2023   LDLDIRECT 150.3 02/11/2010   TRIG 82 05/03/2023   CHOLHDL 3.2 05/03/2023    Lab Results  Component Value Date   HGBA1C 6.2 04/17/2023   Radiology Reports  No results  found.     Signature  -   Susa Raring M.D on 06/21/2023 at 10:15 AM   -  To page go to [email or web address]

## 2023-06-21 NOTE — Anesthesia Procedure Notes (Signed)
 Arterial Line Insertion Start/End3/13/2025 2:20 PM, 06/21/2023 2:24 PM Performed by: April Holding, CRNA, CRNA  Patient location: Pre-op. Preanesthetic checklist: patient identified, IV checked, site marked, risks and benefits discussed, surgical consent, monitors and equipment checked, pre-op evaluation, timeout performed and anesthesia consent Lidocaine 1% used for infiltration Right, radial was placed Catheter size: 20 G Hand hygiene performed , maximum sterile barriers used  and Seldinger technique used Allen's test indicative of satisfactory collateral circulation Attempts: 1 Procedure performed without using ultrasound guided technique. Following insertion, dressing applied and Biopatch. Post procedure assessment: normal and unchanged  Patient tolerated the procedure well with no immediate complications.

## 2023-06-21 NOTE — Plan of Care (Signed)
  Problem: Education: Goal: Ability to describe self-care measures that may prevent or decrease complications (Diabetes Survival Skills Education) will improve Outcome: Progressing Goal: Individualized Educational Video(s) Outcome: Progressing   Problem: Coping: Goal: Ability to adjust to condition or change in health will improve Outcome: Progressing   Problem: Fluid Volume: Goal: Ability to maintain a balanced intake and output will improve Outcome: Progressing   Problem: Health Behavior/Discharge Planning: Goal: Ability to identify and utilize available resources and services will improve Outcome: Progressing Goal: Ability to manage health-related needs will improve Outcome: Progressing   Problem: Metabolic: Goal: Ability to maintain appropriate glucose levels will improve Outcome: Progressing   Problem: Nutritional: Goal: Maintenance of adequate nutrition will improve Outcome: Progressing Goal: Progress toward achieving an optimal weight will improve Outcome: Progressing   Problem: Skin Integrity: Goal: Risk for impaired skin integrity will decrease Outcome: Progressing   Problem: Tissue Perfusion: Goal: Adequacy of tissue perfusion will improve Outcome: Progressing   Problem: Education: Goal: Knowledge of General Education information will improve Description: Including pain rating scale, medication(s)/side effects and non-pharmacologic comfort measures Outcome: Progressing   Problem: Health Behavior/Discharge Planning: Goal: Ability to manage health-related needs will improve Outcome: Progressing   Problem: Clinical Measurements: Goal: Ability to maintain clinical measurements within normal limits will improve Outcome: Progressing Goal: Will remain free from infection Outcome: Progressing Goal: Diagnostic test results will improve Outcome: Progressing Goal: Respiratory complications will improve Outcome: Progressing Goal: Cardiovascular complication will  be avoided Outcome: Progressing   Problem: Activity: Goal: Risk for activity intolerance will decrease Outcome: Progressing   Problem: Nutrition: Goal: Adequate nutrition will be maintained Outcome: Progressing   Problem: Coping: Goal: Level of anxiety will decrease Outcome: Progressing   Problem: Elimination: Goal: Will not experience complications related to bowel motility Outcome: Progressing Goal: Will not experience complications related to urinary retention Outcome: Progressing   Problem: Pain Managment: Goal: General experience of comfort will improve and/or be controlled Outcome: Progressing   Problem: Safety: Goal: Ability to remain free from injury will improve Outcome: Progressing   Problem: Skin Integrity: Goal: Risk for impaired skin integrity will decrease Outcome: Progressing   Problem: Education: Goal: Knowledge of cardiac device and self-care will improve Outcome: Progressing Goal: Ability to safely manage health related needs after discharge will improve Outcome: Progressing Goal: Individualized Educational Video(s) Outcome: Progressing   Problem: Cardiac: Goal: Ability to achieve and maintain adequate cardiopulmonary perfusion will improve Outcome: Progressing

## 2023-06-21 NOTE — Progress Notes (Signed)
      [address]       [place] [address]             [phone]      I provided on site surgical backup for pacemaker lead extraction by Dr. Lalla Brothers  Present from induction until leads out- 74 minutes Incision to lead out time 43 minutes  Viviann Spare C. Dorris Fetch, MD [facility] [phone]

## 2023-06-21 NOTE — Anesthesia Preprocedure Evaluation (Signed)
 Anesthesia Evaluation  Patient identified by MRN, date of birth, ID band Patient awake    Reviewed: Allergy & Precautions, NPO status , Patient's Chart, lab work & pertinent test results  Airway Mallampati: II  TM Distance: >3 FB Neck ROM: Full    Dental  (+) Dental Advisory Given   Pulmonary sleep apnea and Continuous Positive Airway Pressure Ventilation    breath sounds clear to auscultation       Cardiovascular hypertension, Pt. on medications + dysrhythmias + pacemaker + Valvular Problems/Murmurs (MV veg) MR  Rhythm:Regular Rate:Normal     Neuro/Psych negative neurological ROS     GI/Hepatic Neg liver ROS,GERD  ,,  Endo/Other  diabetes, Type 2    Renal/GU Renal disease     Musculoskeletal   Abdominal   Peds  Hematology  (+) Blood dyscrasia, anemia   Anesthesia Other Findings   Reproductive/Obstetrics                             Anesthesia Physical Anesthesia Plan  ASA: 4  Anesthesia Plan: General   Post-op Pain Management: Minimal or no pain anticipated   Induction: Intravenous  PONV Risk Score and Plan: 2 and Dexamethasone, Ondansetron and Treatment may vary due to age or medical condition  Airway Management Planned: Oral ETT  Additional Equipment: Arterial line and TEE  Intra-op Plan:   Post-operative Plan: Extubation in OR  Informed Consent: I have reviewed the patients History and Physical, chart, labs and discussed the procedure including the risks, benefits and alternatives for the proposed anesthesia with the patient or authorized representative who has indicated his/her understanding and acceptance.     Dental advisory given  Plan Discussed with: CRNA  Anesthesia Plan Comments:        Anesthesia Quick Evaluation

## 2023-06-21 NOTE — Progress Notes (Signed)
 PT Cancellation Note  Patient Details Name: Grant Bowers MRN: [id number] DOB: 1945/08/05   Cancelled Treatment:    Reason Eval/Treat Not Completed: Patient declined, no reason specified  Declines to work with PT this afternoon. Reports increased pain and awaiting procedure planned for this evening. Will follow and progress as tolerated and willing.  Kathlyn Sacramento, PT, DPT [facility]  Rehabilitation Services Physical Therapist Office: [phone] Website: [email or web address]   Berton Mount 06/21/2023, 12:27 PM

## 2023-06-21 NOTE — Transfer of Care (Signed)
 Immediate Anesthesia Transfer of Care Note  Patient: Grant Bowers  Procedure(s) Performed: LEAD EXTRACTION PPM GENERATOR REMOVAL  Patient Location: Cath Lab  Anesthesia Type:General  Level of Consciousness: awake and confused  Airway & Oxygen Therapy: Patient Spontanous Breathing  Post-op Assessment: Report given to RN and Post -op Vital signs reviewed and stable  Post vital signs: Reviewed and stable  Last Vitals:  Vitals Value Taken Time  BP 140/78 06/21/23 1628  Temp 36.6 C 06/21/23 1628  Pulse 90 06/21/23 1630  Resp 17 06/21/23 1630  SpO2 97 % 06/21/23 1630  Vitals shown include unfiled device data.  Last Pain:  Vitals:   06/21/23 1628  TempSrc: Oral  PainSc: 0-No pain      Patients Stated Pain Goal: 0 (06/20/23 1749)  Complications: There were no known notable events for this encounter.

## 2023-06-21 NOTE — Consult Note (Signed)
 [facility] Liaison Consult Note   06/21/2023  Grant Bowers 05/11/1945 [id number]   Insurance: [employer]  Primary Care Provider: [name], MD with [name] [name], this provider is listed for the transition of care follow up appointments  and [name] calls   [name] Liaison rounding on unit, patient currently receiving nursing care.   The patient was screened for less than  30 day readmission hospitalization with noted high risk score for unplanned readmission risk 2 hospital admissions in 6 months.  The patient was assessed for potential [name] Coordination service needs for post hospital transition for care coordination. Review of patient's electronic medical record reveals patient is previously out reached. Patient is for surgical intervention per electronic medical record. Spoke with nurse about plan for interventions today.  Plan: [name] Liaison will continue to follow progress and disposition to asess for post hospital community care coordination/management needs.  Referral request for community care coordination: Disposition and ongoing care noted.   [facility] Community Care, Population Health does not replace or interfere with any arrangements made by the Inpatient Transition of Care team.   For questions contact:   Charlesetta Shanks, RN, BSN, CCM [place]  [facility], [facility] Liaison Direct Dial: [phone] or secure chat Email: [email or web address]

## 2023-06-22 ENCOUNTER — Other Ambulatory Visit (HOSPITAL_COMMUNITY): Payer: Self-pay

## 2023-06-22 ENCOUNTER — Inpatient Hospital Stay (HOSPITAL_COMMUNITY)

## 2023-06-22 ENCOUNTER — Encounter (HOSPITAL_COMMUNITY): Payer: Self-pay | Admitting: Cardiology

## 2023-06-22 DIAGNOSIS — M4620 Osteomyelitis of vertebra, site unspecified: Secondary | ICD-10-CM | POA: Diagnosis not present

## 2023-06-22 LAB — CBC WITH DIFFERENTIAL/PLATELET
Abs Immature Granulocytes: 0.07 10*3/uL (ref 0.00–0.07)
Basophils Absolute: 0 10*3/uL (ref 0.0–0.1)
Basophils Relative: 0 %
Eosinophils Absolute: 0 10*3/uL (ref 0.0–0.5)
Eosinophils Relative: 0 %
HCT: 27.2 % — ABNORMAL LOW (ref 39.0–52.0)
Hemoglobin: 8.9 g/dL — ABNORMAL LOW (ref 13.0–17.0)
Immature Granulocytes: 1 %
Lymphocytes Relative: 7 %
Lymphs Abs: 0.6 10*3/uL — ABNORMAL LOW (ref 0.7–4.0)
MCH: 26.7 pg (ref 26.0–34.0)
MCHC: 32.7 g/dL (ref 30.0–36.0)
MCV: 81.7 fL (ref 80.0–100.0)
Monocytes Absolute: 0.3 10*3/uL (ref 0.1–1.0)
Monocytes Relative: 4 %
Neutro Abs: 7.5 10*3/uL (ref 1.7–7.7)
Neutrophils Relative %: 88 %
Platelets: 300 10*3/uL (ref 150–400)
RBC: 3.33 MIL/uL — ABNORMAL LOW (ref 4.22–5.81)
RDW: 15 % (ref 11.5–15.5)
WBC: 8.5 10*3/uL (ref 4.0–10.5)
nRBC: 0 % (ref 0.0–0.2)

## 2023-06-22 LAB — GLUCOSE, CAPILLARY
Glucose-Capillary: 156 mg/dL — ABNORMAL HIGH (ref 70–99)
Glucose-Capillary: 156 mg/dL — ABNORMAL HIGH (ref 70–99)
Glucose-Capillary: 106 mg/dL — ABNORMAL HIGH (ref 70–99)
Glucose-Capillary: 186 mg/dL — ABNORMAL HIGH (ref 70–99)

## 2023-06-22 LAB — COMPREHENSIVE METABOLIC PANEL
ALT: 45 U/L — ABNORMAL HIGH (ref 0–44)
AST: 30 U/L (ref 15–41)
Albumin: 2.1 g/dL — ABNORMAL LOW (ref 3.5–5.0)
Alkaline Phosphatase: 72 U/L (ref 38–126)
Anion gap: 5 (ref 5–15)
BUN: 15 mg/dL (ref 8–23)
CO2: 21 mmol/L — ABNORMAL LOW (ref 22–32)
Calcium: 8.4 mg/dL — ABNORMAL LOW (ref 8.9–10.3)
Chloride: 107 mmol/L (ref 98–111)
Creatinine, Ser: 0.99 mg/dL (ref 0.61–1.24)
GFR, Estimated: >60 mL/min (ref >60)
Glucose, Bld: 178 mg/dL — ABNORMAL HIGH (ref 70–99)
Potassium: 4 mmol/L (ref 3.5–5.1)
Sodium: 133 mmol/L — ABNORMAL LOW (ref 135–145)
Total Bilirubin: 0.6 mg/dL (ref 0.0–1.2)
Total Protein: 5.3 g/dL — ABNORMAL LOW (ref 6.5–8.1)

## 2023-06-22 LAB — PROCALCITONIN: Procalcitonin: <0.1 ng/mL

## 2023-06-22 MED ORDER — FESOTERODINE FUMARATE ER 4 MG PO TB24
4.0000 mg | ORAL_TABLET | Freq: Every day | ORAL | Status: DC
  Administered 2023-06-22 – 2023-06-30 (×9): 4 mg via ORAL
  Filled 2023-06-22 (×9): qty 1

## 2023-06-22 MED ORDER — LIDOCAINE 5 % EX PTCH
2.0000 | MEDICATED_PATCH | CUTANEOUS | 0 refills | Status: DC
  Filled 2023-06-22: qty 30, 15d supply, fill #0

## 2023-06-22 MED ORDER — CHLORHEXIDINE GLUCONATE CLOTH 2 % EX PADS
6.0000 | MEDICATED_PAD | Freq: Every day | CUTANEOUS | Status: DC
  Administered 2023-06-22 – 2023-06-30 (×9): 6 via TOPICAL

## 2023-06-22 MED ORDER — MIRTAZAPINE 15 MG PO TABS
15.0000 mg | ORAL_TABLET | Freq: Every day | ORAL | Status: DC
  Administered 2023-06-22 – 2023-06-29 (×8): 15 mg via ORAL
  Filled 2023-06-22 (×8): qty 1

## 2023-06-22 MED ORDER — OXYCODONE HCL 5 MG PO TABS
5.0000 mg | ORAL_TABLET | ORAL | 0 refills | Status: DC | PRN
  Filled 2023-06-22 – 2023-07-02 (×2): qty 30, 5d supply, fill #0

## 2023-06-22 MED ORDER — ASPIRIN 81 MG PO TBEC
81.0000 mg | DELAYED_RELEASE_TABLET | Freq: Every day | ORAL | Status: DC
  Administered 2023-06-23 – 2023-06-30 (×8): 81 mg via ORAL
  Filled 2023-06-22 (×8): qty 1

## 2023-06-22 MED ORDER — SODIUM CHLORIDE 0.9% FLUSH
10.0000 mL | Freq: Two times a day (BID) | INTRAVENOUS | Status: DC
Start: 2023-06-22 — End: 2023-06-30
  Administered 2023-06-22 – 2023-06-30 (×14): 10 mL

## 2023-06-22 MED ORDER — PANTOPRAZOLE SODIUM 40 MG PO TBEC
40.0000 mg | DELAYED_RELEASE_TABLET | Freq: Every day | ORAL | Status: DC
  Administered 2023-06-22 – 2023-06-30 (×9): 40 mg via ORAL
  Filled 2023-06-22 (×9): qty 1

## 2023-06-22 MED ORDER — METHOCARBAMOL 500 MG PO TABS
500.0000 mg | ORAL_TABLET | Freq: Three times a day (TID) | ORAL | Status: DC
  Administered 2023-06-22 – 2023-06-30 (×24): 500 mg via ORAL
  Filled 2023-06-22 (×24): qty 1

## 2023-06-22 MED ORDER — CEFTRIAXONE IV (FOR PTA / DISCHARGE USE ONLY): 2.0000 g | INTRAVENOUS | 0 refills | Status: DC

## 2023-06-22 MED ORDER — SODIUM CHLORIDE 0.9% FLUSH
10.0000 mL | INTRAVENOUS | Status: DC | PRN
  Administered 2023-06-29: 10 mL

## 2023-06-22 NOTE — Progress Notes (Signed)
 Peripherally Inserted Central Catheter Placement  The IV Nurse has discussed with the patient and/or persons authorized to consent for the patient, the purpose of this procedure and the potential benefits and risks involved with this procedure.  The benefits include less needle sticks, lab draws from the catheter, and the patient may be discharged home with the catheter. Risks include, but not limited to, infection, bleeding, blood clot (thrombus formation), and puncture of an artery; nerve damage and irregular heartbeat and possibility to perform a PICC exchange if needed/ordered by physician.  Alternatives to this procedure were also discussed.  Bard Power PICC patient education guide, fact sheet on infection prevention and patient information card has been provided to patient /or left at bedside.    PICC Placement Documentation  PICC Single Lumen 06/22/23 Right Brachial 37 cm 0 cm (Active)  Indication for Insertion or Continuance of Line Home intravenous therapies (PICC only) 06/22/23 1100  Exposed Catheter (cm) 0 cm 06/22/23 1100  Site Assessment Clean, Dry, Intact 06/22/23 1100  Line Status Flushed;Saline locked;Blood return noted 06/22/23 1100  Dressing Type Transparent;Securing device 06/22/23 1100  Dressing Status Antimicrobial disc/dressing in place;Clean, Dry, Intact 06/22/23 1100  Line Care Connections checked and tightened 06/22/23 1100  Line Adjustment (NICU/IV Team Only) No 06/22/23 1100  Dressing Intervention New dressing 06/22/23 1100  Dressing Change Due 06/29/23 06/22/23 1100       Franne Grip Renee 06/22/2023, 11:23 AM

## 2023-06-22 NOTE — Discharge Summary (Signed)
 Physician Discharge Summary   Patient: Grant Bowers MRN: [id number] DOB: Feb 04, 1946  Admit date:     06/14/2023  Discharge date: 06/22/23  Discharge Physician: Jonah Blue   PCP: [name], MD   Recommendations at discharge:   You are being discharged with home health, who will help with antibiotic administration through the PICC line You are also being discharged with home health physical and occupational therapy PICC line care as directed Continue Ceftriaxone daily for 6 total weeks (end date 4/19) and then transition to oral Cefadroxil for 2 more weeks, for a total of 8 weeks of antibiotics Follow up with a dentist as soon as possible for dental extractions, so that the source of infection is contained while you are still taking antibiotics Pacemaker has been removed; monitor heart rate and for symptoms associated with low heart rate (fatigue, shortness of breath) May remove R groin bandage and shower in the evening on 3/14, but do not scrub Follow up with cardiology and EP (electrophysiology) as scheduled Follow up with infectious disease on 4/3 as scheduled Follow up with Dr. Jonny Ruiz in 1-2 weeks Follow up with Dr. 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  He was recently admitted from 2/23 to 2/25 due to  cholelithiasis without acute cholecystitis which was treated symptomatically.  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  He underwent pacemaker extraction on 3/13.  Plan for PICC line post-extraction for continued IV abx for 6 weeks followed by PO Cefadroxil x 2 weeks.  Needs outpatient dental follow up.   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 Case was discussed with neurosurgery who recommended MRI Patient has a pacemaker so he was transferred to [facility]; however, unable to get MRI due to type of pacemaker and lead position Likely source is dental infection ID recommended echo -> TEE which showed a MV vegetation Repeat blood cultures negative x 5 days Plan is for Ceftriaxone x 6 weeks (EOT 4/19) and then Cefadroxil x 2 weeks ID f/u on 4/3 F/u with CT surgery after completion of antibiotics and dental extraction(s)  Pacemaker Removed on 3/14 Will need outpatient cardiology and EP follow up, cardiology will arrange Continue ASA  Poor dentition Needs dental extraction, hopefully prior to completion of antibiotics to reduce future infection risk    Hyponatremia Stable, not clinically relevant   Normocytic anemia No evidence of overt bleeding Appears to be generally stable but will need to be followed as outpatient  Essential hypertension Continue amlodipine and irbesartan Continue to hold HCTZ BP 135/66, good control  BPH Continue alfuzosin, Detrol   Hyperlipidemia Continue rosuvastatin   Recently diagnosed cholelithiasis Outpatient follow-up with general  surgery.   Diabetes mellitus type 2 with hyperglycemia HbA1c is 6.2, good control Resume metformin at time of  dc Continue SSI in the hospital           Consultants: ID Cardiology/EP CT surgery OT PT TOC team   Procedures: Echocardiogram 3/9 TEE 3/10 Lead extraction, pacemaker removal 3/13   Antibiotics: Cefazolin x 2 doses Zosyn x 1 dose Vancomycin x 1 dose Ceftriaxone 3/7-   Pain control - [facility] Controlled Substance Reporting System database was reviewed. and patient was instructed, not to drive, operate heavy machinery, perform activities at heights, swimming or participation in water activities or provide baby-sitting services while on Pain, Sleep and Anxiety Medications; until their outpatient Physician has advised to do so again. Also recommended to not to take more than prescribed Pain, Sleep and Anxiety Medications.   Disposition: Home Diet recommendation:  Cardiac diet DISCHARGE MEDICATION: Allergies as of 06/22/2023   No Known Allergies      Medication List     PAUSE taking these medications    hydrochlorothiazide 12.5 MG capsule Wait to take this until your doctor or other care provider tells you to start again. Commonly known as: MICROZIDE Take 1 capsule (12.5 mg total) by mouth daily.       TAKE these medications    acetaminophen 325 MG tablet Commonly known as: TYLENOL Take 2 tablets (650 mg total) by mouth every 6 (six) hours as needed for mild pain (pain score 1-3) (or Fever >/= 101). What changed: how much to take   alfuzosin 10 MG 24 hr tablet Commonly known as: UROXATRAL Take 10 mg by mouth at bedtime.   amLODipine 10 MG tablet Commonly known as: NORVASC Take 1 tablet (10 mg total) by mouth daily.   aspirin EC 81 MG tablet Take 1 tablet (81 mg total) by mouth daily with breakfast.   B-D SINGLE USE SWABS REGULAR Pads Use as directed once per day E11.9   cefTRIAXone IVPB Commonly known as: ROCEPHIN Inject 2 g into the vein daily. Indication:  Vertebral osteomyelitis  First Dose: Yes Last Day of Therapy:  07/28/23  Labs - Once  weekly:  CBC/D and BMP, Labs - Once weekly: ESR and CRP Method of administration: IV Push Method of administration may be changed at the discretion of home infusion pharmacist based upon assessment of the patient and/or caregiver's ability to self-administer the medication ordered.   irbesartan 300 MG tablet Commonly known as: AVAPRO Take 1 tablet (300 mg total) by mouth daily.   lidocaine 5 % Commonly known as: LIDODERM Place 2 patches onto the skin daily. Remove & Discard patch within 12 hours or as directed by MD   metFORMIN 500 MG 24 hr tablet Commonly known as: GLUCOPHAGE-XR TAKE 1 TABLET EVERY DAY WITH BREAKFAST   methocarbamol 500 MG tablet Commonly known as: ROBAXIN Take 1 tablet (500 mg total) by mouth 3 (three) times daily.   mirtazapine 15 MG tablet Commonly known as: Remeron Take 1 tablet (15 mg total) by mouth at bedtime.   oxyCODONE 5 MG immediate release tablet Commonly known as: Oxy IR/ROXICODONE Take 1 tablet (5 mg total) by mouth every 4 (four) hours as needed for moderate pain (pain score 4-6). What changed:  when to take this reasons to take this   pantoprazole 40 MG tablet Commonly known as: Protonix Take 1 tablet (40 mg total) by mouth daily.   polyethylene glycol 17 g packet Commonly known as: MIRALAX / GLYCOLAX Take 17 g  by mouth 2 (two) times daily.   rosuvastatin 20 MG tablet Commonly known as: CRESTOR TAKE 1 TABLET EVERY DAY   senna-docusate 8.6-50 MG tablet Commonly known as: Senokot-S Take 2 tablets by mouth at bedtime.   tolterodine 2 MG 24 hr capsule Commonly known as: DETROL LA Take 2 mg by mouth daily.   True Metrix Blood Glucose Test test strip Generic drug: glucose blood TEST BLOOD SUGAR TWO TIMES DAILY AS INSTRUCTED   True Metrix Level 1 Low Soln Use as directed once per day E11.9   True Metrix Meter Devi USE AS DIRECTED TO test blood sugar EVERY DAY   TRUEplus Lancets 33G Misc Use as directed once per day E11.9    Vitamin D3 Super Strength 50 MCG (2000 UT) tablet Generic drug: Cholecalciferol Take 2,000 Units by mouth daily.   zinc gluconate 50 MG tablet Take 50 mg by mouth daily.               Discharge Care Instructions  (From admission, onward)           Start     Ordered   06/21/23 0000  Change dressing on IV access line weekly and PRN  (Home infusion instructions - [facility] )        06/21/23 1428            [facility].   Why: [facility] will call you to resume their home health services after you discharge to home Contact information: [address] [place] [address] [phone]                Discharge Exam:    Subjective: Reports that he feels ready to come if it is time, "I can lay in my bed as easily as I can here."  Still having back pain.  Understands need for dental care ASAP.   Objective: Vitals:   06/22/23 1031 06/22/23 1213  BP: 135/66 (!) 127/43  Pulse:  91  Resp:  16  Temp:  97.9 F (36.6 C)  SpO2:  98%    Intake/Output Summary (Last 24 hours) at 06/22/2023 1549 Last data filed at 06/22/2023 1000 Gross per 24 hour  Intake 1100 ml  Output 760 ml  Net 340 ml   Filed Weights   06/14/23 1645  Weight: 80.4 kg    Exam:  General:  Appears calm and comfortable and is in NAD Eyes:  EOMI, normal lids, iris ENT:  grossly normal hearing, lips & tongue, mmm; suboptimal dentition Neck:  no LAD, masses or thyromegaly Cardiovascular:  RRR, no m/r/g. No LE edema.  Respiratory:   CTA bilaterally with no wheezes/rales/rhonchi.  Normal respiratory effort. Abdomen:  soft, NT, ND Skin:  no rash or induration seen on limited exam Musculoskeletal:  grossly normal tone BUE/BLE, good ROM, no bony abnormality Psychiatric:  grossly normal mood and affect, speech fluent and appropriate, AOx3 Neurologic:  CN 2-12 grossly intact, no gross abnormalities  Data Reviewed: I  have reviewed the patient's lab results since admission.  Pertinent labs for today include:  Na++ 133 CO2 21 Glucose 178 Albumin 2.1 AST 30/ALT 45/Bili 0.6 - stable WBC 8.5 Hgb 8.9     Condition at discharge: good  The results of significant diagnostics from this hospitalization (including imaging, microbiology, ancillary and laboratory) are listed below for reference.   Imaging Studies: DG Chest 2 View Result Date: 06/22/2023 CLINICAL DATA:  Status post removal  of defibrillator. Shortness of breath. EXAM: CHEST - 2 VIEW COMPARISON:  June 14, 2023. FINDINGS: The heart size and mediastinal contours are within normal limits. Left-sided defibrillator noted on prior exam has been removed. Both lungs are clear. The visualized skeletal structures are unremarkable. IMPRESSION: No active cardiopulmonary disease. Electronically Signed   By: Lupita Raider M.D.   On: 06/22/2023 10:34   EP PPM/ICD IMPLANT Result Date: 06/21/2023 CONCLUSIONS:  1.  Symptomatic bradycardia with a history of dual-chamber permanent pacemaker.  Admission for complicated bacteremia.  2.  Successful extraction of a dual-chamber permanent pacemaker  3.  Successful capsulectomy  4.  Transesophageal echocardiogram showing evidence of mitral valve vegetation and associated severe mitral valve regurgitation.  Baseline small pericardial effusion unchanged after lead extraction.  5.  No early apparent complications.   [place] EKG SITE RITE Result Date: 06/21/2023 If Site Rite image not attached, placement could not be confirmed due to current cardiac rhythm.  ECHO TEE Result Date: 06/18/2023    TRANSESOPHOGEAL ECHO REPORT   Patient Name:   Grant Bowers Date of Exam: 06/18/2023 Medical Rec #:  [id number]     Height:       72.0 in Accession #:    [id number]    Weight:       177.2 lb Date of Birth:  1945/08/14    BSA:          2.024 m Patient Age:    80 years      BP:           148/81 mmHg Patient Gender: M             HR:           97  bpm. Exam Location:  Inpatient Procedure: Transesophageal Echo, 3D Echo, Cardiac Doppler and Color Doppler            (Both Spectral and Color Flow Doppler were utilized during            procedure). Indications:     Bacteremia  History:         Patient has prior history of Echocardiogram examinations, most                  recent 06/17/2023. Risk Factors:Hypertension, Diabetes and Sleep                  Apnea.  Sonographer:     Darlys Gales Referring Phys:  [id number] HAO MENG Diagnosing Phys: Thurmon Fair MD PROCEDURE: After discussion of the risks and benefits of a TEE, an informed consent was obtained from a family member. The transesophogeal probe was passed without difficulty through the esophogus of the patient. Sedation performed by different physician. The patient was monitored while under deep sedation. Anesthestetic sedation was provided intravenously by Anesthesiology: 155mg  of Propofol. The patient developed no complications during the procedure.  IMPRESSIONS  1. Left ventricular ejection fraction, by estimation, is 55 to 60%. The left ventricle has low normal function.  2. Right ventricular systolic function is normal. The right ventricular size is normal. Tricuspid regurgitation signal is inadequate for assessing PA pressure.  3. Left atrial size was moderately dilated. No left atrial/left atrial appendage thrombus was detected. The LAA emptying velocity was 60 cm/s.  4. No clear large vegetation is seen. At the level of the medial portion of the anterior leaflet (A3) there is a segment of flail leaflet that is slightly thickened (maximum 4 mm thickness). This may be a sessile  vegetation. There is severe MR: vena contracta 5 mm, effective regurgitant orifice area 0.63 cm sq, regurgitant volume 86 ml, regurgitant fraction 60%. The mitral valve is abnormal. Severe mitral valve regurgitation. No evidence of mitral stenosis.  5. The aortic valve is tricuspid. Aortic valve regurgitation is not visualized. No  aortic stenosis is present.  6. There is Moderate (Grade III) plaque involving the descending aorta.  7. 3D performed of the mitral valve and demonstrates Small vegetation versus flail segment of A3 scallop of the mitral valve. Comparison(s): Mitral insufficiency has been present on transthoracic echo at least since 2022 and has probably been underestimated due to its eccentric jet. There is no previous TEE for comparison. Conclusion(s)/Recommendation(s): Findings are concerning for vegetation/infective endocarditis as detailed above. FINDINGS  Left Ventricle: Left ventricular ejection fraction, by estimation, is 55 to 60%. The left ventricle has low normal function. The left ventricular internal cavity size was normal in size. There is no left ventricular hypertrophy. Right Ventricle: The right ventricular size is normal. No increase in right ventricular wall thickness. Right ventricular systolic function is normal. Tricuspid regurgitation signal is inadequate for assessing PA pressure. Left Atrium: Left atrial size was moderately dilated. No left atrial/left atrial appendage thrombus was detected. The LAA emptying velocity was 60 cm/s. Right Atrium: Right atrial size was normal in size. Pericardium: Trivial pericardial effusion is present. The pericardial effusion is circumferential. Mitral Valve: No clear large vegetation is seen. At the level of the medial portion of the anterior leaflet (A3) there is a segment of flail leaflet that is slightly thickened (maximum 4 mm thickness). This may be a sessile vegetation. There is severe MR: vena contracta 5 mm, effective regurgitant orifice area 0.63 cm sq, regurgitant volume 86 ml, regurgitant fraction 60%. The mitral valve is abnormal. Severe mitral valve regurgitation, with eccentric posteriorly directed jet. No evidence of mitral valve stenosis. The mean mitral valve gradient is 2.1 mmHg. Tricuspid Valve: The tricuspid valve is normal in structure. Tricuspid valve  regurgitation is trivial. Aortic Valve: The aortic valve is tricuspid. Aortic valve regurgitation is not visualized. No aortic stenosis is present. Pulmonic Valve: The pulmonic valve was normal in structure. Pulmonic valve regurgitation is not visualized. No evidence of pulmonic stenosis. Aorta: The aortic root, ascending aorta, aortic arch and descending aorta are all structurally normal, with no evidence of dilitation or obstruction. There is moderate (Grade III) plaque involving the descending aorta. Venous: A systolic blunting flow pattern is recorded from the left upper pulmonary vein and the right upper pulmonary vein. IAS/Shunts: No atrial level shunt detected by color flow Doppler. Additional Comments: 3D was performed not requiring image post processing on an independent workstation and was abnormal. A device lead is visualized in the right ventricle, right atrium and superior vena cava. LEFT VENTRICLE PLAX 2D LVOT diam:     2.24 cm LV SV:         57 LV SV Index:   28 LVOT Area:     3.96 cm  AORTIC VALVE LVOT Vmax:   74.20 cm/s LVOT Vmean:  59.200 cm/s LVOT VTI:    0.145 m MITRAL VALVE MV Mean grad: 2.1 mmHg SHUNTS                        Systemic VTI:  0.14 m                        Systemic Diam: 2.24 cm Mihai  Croitoru MD Electronically signed by Thurmon Fair MD Signature Date/Time: 06/18/2023/12:06:06 PM    Final    EP STUDY Result Date: 06/18/2023 See surgical note for result.  ECHOCARDIOGRAM LIMITED Result Date: 06/17/2023    ECHOCARDIOGRAM LIMITED REPORT   Patient Name:   Grant Bowers Date of Exam: 06/17/2023 Medical Rec #:  [id number]     Height:       72.0 in Accession #:    [id number]    Weight:       177.2 lb Date of Birth:  1946-02-28    BSA:          2.024 m Patient Age:    89 years      BP:           132/64 mmHg Patient Gender: M             HR:           110 bpm. Exam Location:  Inpatient Procedure: Limited Echo, Cardiac Doppler and Color Doppler (Both Spectral and            Color Flow  Doppler were utilized during procedure). Indications:    endocarditis  History:        Patient has prior history of Echocardiogram examinations, most                 recent 05/14/2023. Pacemaker, chronic kidney disease; Risk                 Factors:Hypertension, Dyslipidemia, Diabetes and Sleep Apnea.  Sonographer:    Delcie Roch RDCS Referring Phys: [id number] [facility] IMPRESSIONS  1. Left ventricular ejection fraction, by estimation, is 55 to 60%. The left ventricle has normal function.  2. Right ventricular systolic function is normal. The right ventricular size is normal.  3. A small pericardial effusion is present. The pericardial effusion is circumferential.  4. Mild mitral valve regurgitation. No evidence of mitral stenosis.  5. The aortic valve is tricuspid. There is mild calcification of the aortic valve. There is mild thickening of the aortic valve.  6. The inferior vena cava is normal in size with greater than 50% respiratory variability, suggesting right atrial pressure of 3 mmHg.  7. Limited echo FINDINGS  Left Ventricle: Left ventricular ejection fraction, by estimation, is 55 to 60%. The left ventricle has normal function. Right Ventricle: The right ventricular size is normal. Right vetricular wall thickness was not well visualized. Right ventricular systolic function is normal. Pericardium: A small pericardial effusion is present. The pericardial effusion is circumferential. Mitral Valve: Mild mitral valve regurgitation. No evidence of mitral valve stenosis. Tricuspid Valve: The tricuspid valve is normal in structure. Tricuspid valve regurgitation is not demonstrated. No evidence of tricuspid stenosis. Aortic Valve: The aortic valve is tricuspid. There is mild calcification of the aortic valve. There is mild thickening of the aortic valve. There is mild aortic valve annular calcification. Venous: The inferior vena cava is normal in size with greater than 50% respiratory variability, suggesting  right atrial pressure of 3 mmHg. Additional Comments: A device lead is visualized in the right atrium and right ventricle. Spectral Doppler performed. Color Doppler performed.  IVC IVC diam: 1.70 cm MR Peak grad: 119.7 mmHg MR Mean grad: 71.0 mmHg MR Vmax:      547.00 cm/s MR Vmean:     397.0 cm/s Dina Rich MD Electronically signed by Dina Rich MD Signature Date/Time: 06/17/2023/7:39:57 PM    Final    DG Orthopantogram Result Date: 06/17/2023  CLINICAL DATA:  [id number]. Abscess. EXAM: ORTHOPANTOGRAM/PANORAMIC COMPARISON:  None. FINDINGS: A single view is provided. There are multiple upper and lower metallic crowns and fillings. There is a metallic crown in the expected location of the right upper front lateral incisor but the root structure is not well seen. This could be on a technical basis, motion artifact or carious involvement of the root structure. There appears to have been some degree of motion at the level of the upper and lower incisors. The 12 year and 6 year molars of the right hemimandible demonstrate severe decay with only the roots remaining of the 6 year molar. The 12 year molar still has a portion of the crown intact but most of it is decayed. Bone recession has occurred along the alveolar process adjacent the right mandibular 6 and 12 year molars and is also somewhat noted along the mandibular incisors and bicuspids. There is slight periapical lucency noted underlying the roots of the right mandibular decayed molars and also underlying the right mandibular first bicuspid and the left mandibular second bicuspid. The left mandibular 6 year molar has been previously removed with medial tilting of the left mandibular 12 year molar. The upper teeth are not quite as well seen due to motion artifacts but except as above they appear grossly intact without obvious periapical lucency. Both TMJs are normally located. IMPRESSION: 1. Multiple upper and lower metallic crowns and fillings. 2. The root  structure of the right upper front lateral incisor is not well seen. This could be on a technical basis, motion artifact or carious involvement of the root structure. 3. Severe decay of the right mandibular 6 year and 12 year molars with bone erosion along the alveolar process adjacent. 4. Slight periapical lucency underlying the roots of the right mandibular 6 year molar and also underlying the right mandibular first bicuspid and the left mandibular second bicuspid. 5. The left mandibular 6 year molar has been previously removed with medial tilting of the left mandibular 12 year molar. 6. The upper teeth are not as well seen due to motion, but except as above appear grossly intact. Electronically Signed   By: Almira Bar M.D.   On: 06/17/2023 03:20   DG HIP UNILAT WITH PELVIS 2-3 VIEWS RIGHT Result Date: 06/16/2023 CLINICAL DATA:  [id number].  Infection. Right hip pain. EXAM: DG HIP (WITH OR WITHOUT PELVIS) 2-3V RIGHT COMPARISON:  AP pelvis and right hip views 06/04/2023 FINDINGS: There is no evidence of hip fracture or dislocation. There is no evidence of arthropathy or other focal bone abnormality. There are patchy calcific plaques in the right superficial femoral artery with otherwise unremarkable soft tissues. Compare: Unchanged. IMPRESSION: 1. No evidence of fracture or dislocation. 2. Atherosclerosis. Electronically Signed   By: Almira Bar M.D.   On: 06/16/2023 22:06   CT ABDOMEN PELVIS W CONTRAST Result Date: 06/14/2023 CLINICAL DATA:  Acute generalized abdominal pain. EXAM: CT ABDOMEN AND PELVIS WITH CONTRAST TECHNIQUE: Multidetector CT imaging of the abdomen and pelvis was performed using the standard protocol following bolus administration of intravenous contrast. RADIATION DOSE REDUCTION: This exam was performed according to the departmental dose-optimization program which includes automated exposure control, adjustment of the mA and/or kV according to patient size and/or use of iterative  reconstruction technique. CONTRAST:  80mL OMNIPAQUE IOHEXOL 300 MG/ML  SOLN COMPARISON:  June 03, 2023.  December 15, 2021. FINDINGS: Lower chest: Small pericardial effusion is noted. Minimal bilateral posterior basilar subsegmental atelectasis. Hepatobiliary: Small gallstone is noted with mild  gallbladder distension. No biliary dilatation. Probable hepatic cysts are noted. Pancreas: Unremarkable. No pancreatic ductal dilatation or surrounding inflammatory changes. Spleen: Mild splenomegaly. Adrenals/Urinary Tract: Adrenal glands appear normal. Bilateral renal cysts are noted for which no further follow-up is required. No hydronephrosis or renal obstruction is noted. Urinary bladder is unremarkable. Stomach/Bowel: Stomach is within normal limits. Appendix appears normal. No evidence of bowel wall thickening, distention, or inflammatory changes. Vascular/Lymphatic: Aortic atherosclerosis. No enlarged abdominal or pelvic lymph nodes. Reproductive: Mild prostatic enlargement is again noted. Other: Small bilateral fat containing inguinal hernias are noted. No ascites is noted. Musculoskeletal: There appears to be irregular defect involving the posterior portion of the L4 vertebral body. IMPRESSION: Irregular defect is seen involving posterior portion of L4 vertebral body. Osteomyelitis cannot be excluded. MRI with and without gadolinium is recommended for further evaluation. Small pericardial effusion. Small gallstone with gallbladder distention. Mild splenomegaly. Mild prostatic enlargement. Aortic Atherosclerosis (ICD10-I70.0). Electronically Signed   By: Lupita Raider M.D.   On: 06/14/2023 20:57   DG Chest Portable 1 View Result Date: 06/14/2023 CLINICAL DATA:  Fever. EXAM: PORTABLE CHEST 1 VIEW COMPARISON:  April 26, 2023 FINDINGS: There is stable dual lead AICD positioning. The heart size and mediastinal contours are within normal limits. Both lungs are clear. Multilevel degenerative changes seen  throughout the thoracic spine. IMPRESSION: No active disease. Electronically Signed   By: Aram Candela M.D.   On: 06/14/2023 20:26   US Carotid Bilateral Result Date: 06/05/2023 CLINICAL DATA:  Possible syncope.  Right-sided bruit. EXAM: BILATERAL CAROTID DUPLEX ULTRASOUND TECHNIQUE: Wallace Cullens scale imaging, color Doppler and duplex ultrasound were performed of bilateral carotid and vertebral arteries in the neck. COMPARISON:  None Available. FINDINGS: Criteria: Quantification of carotid stenosis is based on velocity parameters that correlate the residual internal carotid diameter with NASCET-based stenosis levels, using the diameter of the distal internal carotid lumen as the denominator for stenosis measurement. The following velocity measurements were obtained: RIGHT ICA: 164/16 cm/sec CCA: 74/11 cm/sec SYSTOLIC ICA/CCA RATIO:  2.9 ECA: 249 cm/sec LEFT ICA: 103/29 cm/sec CCA: 85/14 cm/sec SYSTOLIC ICA/CCA RATIO:  1.2 ECA: 161 cm/sec RIGHT CAROTID ARTERY: Echogenic plaque at the right carotid bulb. Elevated peak systolic velocity in the right external carotid artery. Echogenic plaque in the proximal internal carotid artery. Peak systolic velocity in the proximal internal carotid artery is 164 cm/sec. RIGHT VERTEBRAL ARTERY: Antegrade flow and normal waveform in the right vertebral artery. LEFT CAROTID ARTERY: Intimal thickening in the left common carotid artery. Small amount of echogenic plaque at the left carotid bulb. External carotid artery is patent with normal waveform. Normal velocities in the internal carotid artery. LEFT VERTEBRAL ARTERY: Antegrade flow and normal waveform in the left vertebral artery. IMPRESSION: 1. Atherosclerotic plaque at the right carotid bulb and right internal carotid artery with elevated peak systolic velocity in the proximal right internal carotid artery. 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 2. Mild atherosclerotic plaque in the  left carotid arteries. Findings are compatible with less than 50% stenosis in the left internal carotid artery. 3. Antegrade flow in the vertebral arteries. 4. Probable stenosis in the right external carotid artery. Electronically Signed   By: Richarda Overlie M.D.   On: 06/05/2023 10:59   DG HIP UNILAT W OR W/O PELVIS 2-3 VIEWS RIGHT Result Date: 06/04/2023 CLINICAL DATA:  Right hip pain, fall 2 weeks ago EXAM: DG HIP (WITH OR WITHOUT PELVIS) 2-3V RIGHT COMPARISON:  None Available. FINDINGS: There is  no evidence of hip fracture or dislocation. There is no evidence of arthropathy or other focal bone abnormality. IMPRESSION: Negative. Electronically Signed   By: Charlett Nose M.D.   On: 06/04/2023 20:01   US Abdomen Limited Result Date: 06/04/2023 CLINICAL DATA:  Abdominal pain EXAM: ULTRASOUND ABDOMEN LIMITED RIGHT UPPER QUADRANT COMPARISON:  CT 06/03/2023. FINDINGS: Gallbladder: Dilated gallbladder. Dependent stones. Is also some non dependent echogenic areas which could resent small polyps measuring up to 4 mm. No adjacent fluid or wall thickening. No reported sonographic Murphy's sign. Common bile duct: Diameter: 4 mm Liver: No focal lesion identified. Within normal limits in parenchymal echogenicity. Portal vein is patent on color Doppler imaging with normal direction of blood flow towards the liver. Other: Trace right pleural fluid. Small right-sided renal cystic focus identified as seen on prior CT. IMPRESSION: Distended gallbladder with stones and small polyps. No wall thickening, adjacent fluid or ductal dilatation. No additional sonographic evidence of acute cholecystitis. Trace right pleural fluid. Please correlate with prior CT Electronically Signed   By: Karen Kays M.D.   On: 06/04/2023 12:34   CT Angio Chest/Abd/Pel for Dissection W and/or Wo Contrast Result Date: 06/03/2023 CLINICAL DATA:  Head and neck trauma, low back pain and multiple frequent falls. Evaluating for acute aortic syndrome. EXAM:  CT ANGIOGRAPHY CHEST, ABDOMEN AND PELVIS TECHNIQUE: A noncontrast chest CT study was initially performed to look for aortic hematoma. Multidetector CT imaging through the chest, abdomen and pelvis was performed using the standard protocol during bolus administration of intravenous contrast. Multiplanar reconstructed images and MIPs were obtained and reviewed to evaluate the vascular anatomy. RADIATION DOSE REDUCTION: This exam was performed according to the departmental dose-optimization program which includes automated exposure control, adjustment of the mA and/or kV according to patient size and/or use of iterative reconstruction technique. CONTRAST:  80mL OMNIPAQUE IOHEXOL 350 MG/ML SOLN COMPARISON:  CTA chest and PA and lateral chest both 04/26/2023, CT abdomen pelvis without and with contrast 12/16/2018. FINDINGS: CTA CHEST FINDINGS Cardiovascular: There is metallic artifact from a left chest dual lead pacing system and wires in the right heart. The pulmonary arteries are normal in caliber, centrally clear on this nondedicated exam. There is preferential opacification of the aorta and great vessels. There is no aneurysm, dissection or stenosis. There are moderate to heavy aortic calcific plaques, occasional calcifications in the great vessels. There is a normal variant brachiobicarotid trunk and normal variant origin of the left vertebral artery from the aortic arch. The cardiac size is normal. There is interval increased pericardial effusion now 1 cm in thickness, previously 7 mm. Scattered calcific plaque left main, lad and circumflex coronary arteries. No venous dilatation. Mediastinum/Nodes: Few slightly frontal bilateral hilar nodes up to 1.1 cm in short axis. No mediastinal or axillary adenopathy. Thyroid gland is unremarkable. The thoracic trachea, thoracic esophagus, both main bronchi unremarkable. Lungs/Pleura: There are bilateral minimal layering pleural effusions, both slightly increased since  04/26/2023. Again noted linear scarring or atelectasis in both lung bases. Mild fissural atelectasis in the left upper lobe. There is no consolidation or pneumothorax. No pulmonary nodules are seen. Musculoskeletal: Extensive bridging enthesopathy of the thoracic spine beginning at T4. Degenerative change and mild thoracic dextroscoliosis. No acute or other significant osseous findings.  No chest wall mass. Review of the MIP images confirms the above findings. CTA ABDOMEN AND PELVIS FINDINGS VASCULAR Aorta: Moderate to heavy aortoiliac calcific plaques. No stenosis. 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 Celiac: Patent  without evidence of aneurysm, dissection, vasculitis or significant stenosis. Scattered branch calcific plaques splenic artery. No branch occlusions. Nonstenosing ostial calcific plaques. SMA: 50-60% calcific origin stenosis. The vessel otherwise opacifies well without visible branch occlusion. Minimal calcific plaque past the inflection point of the vessel. Renals: 2 roughly codominant arteries supplying each kidney. Both upper pole arteries demonstrate approximately 50% calcific origin stenosis. The right lower pole artery appears to have at least a 75 % calcific origin stenosis. The left lower pole artery is patent. IMA: Patent without evidence of aneurysm, dissection, vasculitis or significant stenosis. Inflow: Patent without evidence of aneurysm, dissection, vasculitis or significant stenosis. There are mild-to-moderate calcific plaques. Veins: No obvious venous abnormality within the limitations of this arterial phase study. Review of the MIP images confirms the above findings. NON-VASCULAR Hepatobiliary: Small scattered hepatic cysts and a few too small to characterize hepatic hypodensities, unchanged. No follow-up imaging recommended. Largest cyst is 1.7 cm and 11 Hounsfield units in the dome of segment 8. Gallbladder dilated to nearly 11 cm.  There is a subcentimeter stone in the proximal lumen. On some of the axial images there is suspicion of pericholecystic edema and slight gallbladder wall thickening which may be seen with cholecystitis. Consider ultrasound follow-up for further study. No biliary dilatation. Pancreas: No abnormality. Spleen: Increased splenomegaly, the current AP splenic axis 17 cm previously 15.5 cm. No mass. Adrenals/Urinary Tract: There is no adrenal mass. Bilateral Bosniak 1 renal cysts are again seen, largest is a right parapelvic cyst measuring 4.6 cm and 15 Hounsfield units. No follow-up imaging is recommended. There is cortical thinning in both kidneys, scattered cortical scarring of the inferior poles. There previously was a 6 mm right ureteral stone which has passed. There is no hydronephrosis or appreciable urinary stones at present. No mass enhancement. The bladder is unremarkable. Stomach/Bowel: No dilatation or wall thickening including the appendix. Moderate fecal stasis. Sigmoid diverticulosis with no evidence of diverticulitis. Lymphatic: No appreciable adenopathy. Reproductive: 5.5 cm enlarged prostate. Scattered dystrophic prostatic calcifications. There is a mild posterior bladder impression. Right testicle appears to be retracted into the inguinal canal. Other: Small umbilical and inguinal fat hernias. No free hemorrhage, free fluid or free air, or localizing collections. Musculoskeletal: Degenerative change and slight levoscoliosis lumbar spine. Ankylosed SI joints. Mild hip DJD. No concerning regional bone lesion. Lumbar spine CT is dictated separately. Review of the MIP images confirms the above findings. IMPRESSION: 1. No acute trauma related findings in the chest, abdomen or pelvis. 2. Aortoiliac and coronary artery atherosclerosis. No dissection or aortic stenosis. 3. 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 Recommend surveillance ultrasound in 5 years. Reference: Journal of Vascular Surgery 67.1 (2018):  2-77. J Am Coll Radiol 2013;10:789-794. 4. 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 5. 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 6. Dilated gallbladder with stone and possible pericholecystic edema and slight wall thickening. Consider ultrasound follow-up to assess for cholecystitis. 7. Increased splenomegaly. 8. Constipation and diverticulosis. 9. Prostatomegaly with mild posterior bladder impression. Right testicle appears retracted into the inguinal canal. 10. Umbilical and inguinal fat hernias. 11. Increased pericardial effusion now 1 cm in thickness, previously 7 mm. 12. Slightly increased bilateral pleural effusions. 13. Critical Value/emergent results were called by telephone at the time of interpretation on 06/03/2023 at 11:29 pm to provider JOSEPH ZAMMIT , who verbally acknowledged these results. Aortic Atherosclerosis (ICD10-I70.0). Electronically Signed   By: Almira Bar M.D.   On: 06/03/2023 23:47  CT L-SPINE NO CHARGE Result Date: 06/03/2023 CLINICAL DATA:  Frequent falls, worsening back pain. EXAM: CT LUMBAR SPINE WITHOUT CONTRAST TECHNIQUE: Multidetector CT imaging of the lumbar spine was performed without intravenous contrast administration. Multiplanar CT image reconstructions were also generated. RADIATION DOSE REDUCTION: This exam was performed according to the departmental dose-optimization program which includes automated exposure control, adjustment of the mA and/or kV according to patient size and/or use of iterative reconstruction technique. COMPARISON:  CT abdomen and pelvis without and with contrast 12/15/2021 FINDINGS: Segmentation: 5 lumbar type vertebrae. Alignment: Chronic minimal discogenic degenerative grade 1 retrolisthesis L3-4, L4-5 and L5-S1. Slight levoscoliosis. No new, worsening or further alignment abnormality is seen. Vertebrae: Due to technique, the images are grainy with suboptimal spatial  resolution. Accounting for this limitation no acute fracture is suspected or focal pathologic process. The vertebra are normal in heights. There are prominent marginal osteophytes, with attempted bridging anteriorly at L2-3 and to the right anteriorly at L4-5 and L5-S1. Paraspinal and other soft tissues: Heavy aortoiliac calcific plaques. 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 Multiple partially visible Bosniak 1 right renal cysts. No follow-up imaging recommended. No paraspinal hematoma, mass or fluid collection. Disc levels: T11-12: The disc is degenerated. Small anterior osteophytes. No herniation or stenosis. T12-L1: The disc is normal in height. There is no bulge, herniation or stenosis. Trace facet spurring. L1-2: The disc is normal in height. There is a minimal nonstenosing dorsal disc bulge without herniation. The foramina are clear. Trace facet spurring. L2-3: The disc is normal in height. There is a mild diffuse annular bulge without herniation, spinal canal or foraminal stenosis. Mild facet joint spurring. L3-4: Mild-to-moderate disc space loss. Anterior and posterior osteophytes are present. Broad-based posterior disc osteophyte complex mildly encroaches on the lateral recesses and descending L4 nerve roots. There is only slight spinal canal stenosis. Mild facet spurring with unilateral mild right foraminal stenosis. L4-5: Mild disc space loss. There is circumferential disc osteophyte complex. Right paracentral shallow disc protrusion is seen with likely mass effect on the descending right L5 nerve root. There is mild-to-moderate spinal canal stenosis with hypertrophic dorsal ligamentous calcification contributing. Facet spurring with bilateral mild foraminal stenosis. L5-S1: Mild disc space loss. Partial ossification across the posterior disc space. Posterior endplate osteophytes with posterior disc osteophyte complex again noted. Shallow central disc protrusion is seen, with disc osteophyte complex  and protrusion causing mild mass effect on the S1 nerve roots. No significant foraminal narrowing and only mild facet spurring. 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 The visualized sacrum is intact. Unremarkable visualized posterior pelvis. IMPRESSION: 1. Suboptimal image quality, with no convincing acute lumbar fracture or focal pathologic process. 2. Scoliosis and degenerative change with multilevel disc osteophyte complexes, and protrusions at the lowest 2 levels. 3. Right paracentral shallow disc protrusion at L4-5 with likely mass effect on the descending right L5 nerve root. 4. Shallow central disc protrusion at L5-S1 with disc osteophyte complex and protrusion causing mild mass effect on the S1 nerve roots. 5. Multilevel facet spurring with mild foraminal stenosis. 6. Aortoiliac atherosclerosis. 7. 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 Recommend follow-up ultrasound every 5 years. Reference: Journal of Vascular Surgery 67.1 (2018): 2-77. J Am Coll Radiol [phone]. Aortic Atherosclerosis (ICD10-I70.0). Electronically Signed   By: Almira Bar M.D.   On: 06/03/2023 23:03   CT Head Wo Contrast Result Date: 06/03/2023 CLINICAL DATA:  Falls EXAM: CT HEAD WITHOUT CONTRAST CT CERVICAL SPINE WITHOUT CONTRAST TECHNIQUE: Multidetector CT imaging of the head  and cervical spine was performed following the standard protocol without intravenous contrast. Multiplanar CT image reconstructions of the cervical spine were also generated. RADIATION DOSE REDUCTION: This exam was performed according to the departmental dose-optimization program which includes automated exposure control, adjustment of the mA and/or kV according to patient size and/or use of iterative reconstruction technique. COMPARISON:  None Available. FINDINGS: CT HEAD FINDINGS Brain: No mass,hemorrhage or extra-axial collection. Normal appearance of the parenchyma and CSF spaces. Vascular: No hyperdense vessel or unexpected vascular  calcification. Skull: The visualized skull base, calvarium and extracranial soft tissues are normal. Sinuses/Orbits: No fluid levels or advanced mucosal thickening of the visualized paranasal sinuses. No mastoid or middle ear effusion. Normal orbits. Other: None. CT CERVICAL SPINE FINDINGS Alignment: No static subluxation. Facets are aligned. Occipital condyles are normally positioned. Skull base and vertebrae: No acute fracture. Soft tissues and spinal canal: No prevertebral fluid or swelling. No visible canal hematoma. Disc levels: No advanced spinal canal or neural foraminal stenosis. Upper chest: No pneumothorax, pulmonary nodule or pleural effusion. Other: Normal visualized paraspinal cervical soft tissues. IMPRESSION: 1. No acute intracranial abnormality. 2. No acute fracture or static subluxation of the cervical spine. Electronically Signed   By: Deatra Robinson M.D.   On: 06/03/2023 22:45   CT Cervical Spine Wo Contrast Result Date: 06/03/2023 CLINICAL DATA:  Falls EXAM: CT HEAD WITHOUT CONTRAST CT CERVICAL SPINE WITHOUT CONTRAST TECHNIQUE: Multidetector CT imaging of the head and cervical spine was performed following the standard protocol without intravenous contrast. Multiplanar CT image reconstructions of the cervical spine were also generated. RADIATION DOSE REDUCTION: This exam was performed according to the departmental dose-optimization program which includes automated exposure control, adjustment of the mA and/or kV according to patient size and/or use of iterative reconstruction technique. COMPARISON:  None Available. FINDINGS: CT HEAD FINDINGS Brain: No mass,hemorrhage or extra-axial collection. Normal appearance of the parenchyma and CSF spaces. Vascular: No hyperdense vessel or unexpected vascular calcification. Skull: The visualized skull base, calvarium and extracranial soft tissues are normal. Sinuses/Orbits: No fluid levels or advanced mucosal thickening of the visualized paranasal sinuses.  No mastoid or middle ear effusion. Normal orbits. Other: None. CT CERVICAL SPINE FINDINGS Alignment: No static subluxation. Facets are aligned. Occipital condyles are normally positioned. Skull base and vertebrae: No acute fracture. Soft tissues and spinal canal: No prevertebral fluid or swelling. No visible canal hematoma. Disc levels: No advanced spinal canal or neural foraminal stenosis. Upper chest: No pneumothorax, pulmonary nodule or pleural effusion. Other: Normal visualized paraspinal cervical soft tissues. IMPRESSION: 1. No acute intracranial abnormality. 2. No acute fracture or static subluxation of the cervical spine. Electronically Signed   By: Deatra Robinson M.D.   On: 06/03/2023 22:45    Microbiology: Results for orders placed or performed during the hospital encounter of 06/14/23  Culture, blood (routine x 2)     Status: Abnormal   Collection Time: 06/14/23  5:25 PM   Specimen: Left Antecubital; Blood  Result Value Ref Range Status   Specimen Description   Final    LEFT ANTECUBITAL BOTTLES DRAWN AEROBIC AND ANAEROBIC Performed at [facility], [address], [place], [place] [address]    Special Requests   Final    Blood Culture results may not be optimal due to an inadequate volume of blood received in culture bottles Performed at [facility], [address] [place], [place] [address]    Culture  Setup Time   Final    GRAM POSITIVE COCCI IN BOTH AEROBIC  AND ANAEROBIC BOTTLES Gram Stain Report Called to,Read Back By and Verified With: NICHOLS @ 0649 ON [id number] BY HENDERSON L CRITICAL RESULT CALLED TO, READ BACK BY AND VERIFIED WITH: PHARMD E.SINCLAIR AT 0944 ON 06/15/2023 BY T.SAAD. Performed at [facility], [address] [place], [place] [address]    Culture STREPTOCOCCUS MITIS/ORALIS (A)  Final   Report Status 06/17/2023 FINAL  Final   Organism ID, Bacteria STREPTOCOCCUS MITIS/ORALIS  Final      Susceptibility   Streptococcus mitis/oralis - MIC*    PENICILLIN  <=0.06 SENSITIVE Sensitive     CEFTRIAXONE <=0.12 SENSITIVE Sensitive     LEVOFLOXACIN 0.5 SENSITIVE Sensitive     VANCOMYCIN 0.5 SENSITIVE Sensitive     * STREPTOCOCCUS MITIS/ORALIS  Culture, blood (routine x 2)     Status: Abnormal   Collection Time: 06/14/23  5:25 PM   Specimen: BLOOD LEFT FOREARM  Result Value Ref Range Status   Specimen Description   Final    BLOOD LEFT FOREARM BOTTLES DRAWN AEROBIC AND ANAEROBIC Performed at [facility], [address] [place], [place] [address]    Special Requests   Final    Blood Culture adequate volume Performed at [facility], [address], [place], [place] [address]    Culture  Setup Time   Final    GRAM POSITIVE COCCI IN BOTH AEROBIC AND ANAEROBIC BOTTLES Gram Stain Report Called to,Read Back By and Verified With: NICHOLS @ 0649 ON [id number] BY HENDERSON L CRITICAL VALUE NOTED.  VALUE IS CONSISTENT WITH PREVIOUSLY REPORTED AND CALLED VALUE.    Culture (A)  Final    STREPTOCOCCUS MITIS/ORALIS SUSCEPTIBILITIES PERFORMED ON PREVIOUS CULTURE WITHIN THE LAST 5 DAYS. Performed at [facility], [address], [place], [place] [address]    Report Status 06/17/2023 FINAL  Final  Blood Culture ID Panel (Reflexed)     Status: Abnormal   Collection Time: 06/14/23  5:25 PM  Result Value Ref Range Status   Enterococcus faecalis NOT DETECTED NOT DETECTED Final   Enterococcus Faecium NOT DETECTED NOT DETECTED Final   Listeria monocytogenes NOT DETECTED NOT DETECTED Final   Staphylococcus species NOT DETECTED NOT DETECTED Final   Staphylococcus aureus (BCID) NOT DETECTED NOT DETECTED Final   Staphylococcus epidermidis NOT DETECTED NOT DETECTED Final   Staphylococcus lugdunensis NOT DETECTED NOT DETECTED Final   Streptococcus species DETECTED (A) NOT DETECTED Final    Comment: Not Enterococcus species, Streptococcus agalactiae, Streptococcus pyogenes, or Streptococcus pneumoniae. CRITICAL RESULT CALLED TO, READ BACK BY AND VERIFIED  WITH: PHARMD E.SINCLAIR AT 0944 ON 06/15/2023 BY T.SAAD.    Streptococcus agalactiae NOT DETECTED NOT DETECTED Final   Streptococcus pneumoniae NOT DETECTED NOT DETECTED Final   Streptococcus pyogenes NOT DETECTED NOT DETECTED Final   A.calcoaceticus-baumannii NOT DETECTED NOT DETECTED Final   Bacteroides fragilis NOT DETECTED NOT DETECTED Final   Enterobacterales NOT DETECTED NOT DETECTED Final   Enterobacter cloacae complex NOT DETECTED NOT DETECTED Final   Escherichia coli NOT DETECTED NOT DETECTED Final   Klebsiella aerogenes NOT DETECTED NOT DETECTED Final   Klebsiella oxytoca NOT DETECTED NOT DETECTED Final   Klebsiella pneumoniae NOT DETECTED NOT DETECTED Final   Proteus species NOT DETECTED NOT DETECTED Final   Salmonella species NOT DETECTED NOT DETECTED Final   Serratia marcescens NOT DETECTED NOT DETECTED Final   Haemophilus influenzae NOT DETECTED NOT DETECTED Final   Neisseria meningitidis NOT DETECTED NOT DETECTED Final   Pseudomonas aeruginosa NOT DETECTED NOT DETECTED Final  Stenotrophomonas maltophilia NOT DETECTED NOT DETECTED Final   Candida albicans NOT DETECTED NOT DETECTED Final   Candida auris NOT DETECTED NOT DETECTED Final   Candida glabrata NOT DETECTED NOT DETECTED Final   Candida krusei NOT DETECTED NOT DETECTED Final   Candida parapsilosis NOT DETECTED NOT DETECTED Final   Candida tropicalis NOT DETECTED NOT DETECTED Final   Cryptococcus neoformans/gattii NOT DETECTED NOT DETECTED Final    Comment: Performed at [facility] [facility], [address] [place], [place] [address]  Resp panel by RT-PCR (RSV, Flu A&B, Covid) Anterior Nasal Swab     Status: None   Collection Time: 06/14/23  5:53 PM   Specimen: Anterior Nasal Swab  Result Value Ref Range Status   SARS Coronavirus 2 by RT PCR NEGATIVE NEGATIVE Final    Comment: (NOTE) SARS-CoV-2 target nucleic acids are NOT DETECTED.  The SARS-CoV-2 RNA is generally detectable in upper respiratory specimens  during the acute phase of infection. The lowest concentration of SARS-CoV-2 viral copies this assay can detect is 138 copies/mL. A negative result does not preclude SARS-Cov-2 infection and should not be used as the sole basis for treatment or other patient management decisions. A negative result may occur with  improper specimen collection/handling, submission of specimen other than nasopharyngeal swab, presence of viral mutation(s) within the areas targeted by this assay, and inadequate number of viral copies(<138 copies/mL). A negative result must be combined with clinical observations, patient history, and epidemiological information. The expected result is Negative.  Fact Sheet for Patients:  BloggerCourse.com  Fact Sheet for Healthcare Providers:  SeriousBroker.it  This test is no t yet approved or cleared by the [place] FDA and  has been authorized for detection and/or diagnosis of SARS-CoV-2 by FDA under an Emergency Use Authorization (EUA). This EUA will remain  in effect (meaning this test can be used) for the duration of the COVID-19 declaration under Section 564(b)(1) of the Act, 21 U.S.C.section 360bbb-3(b)(1), unless the authorization is terminated  or revoked sooner.       Influenza A by PCR NEGATIVE NEGATIVE Final   Influenza B by PCR NEGATIVE NEGATIVE Final    Comment: (NOTE) The Xpert Xpress SARS-CoV-2/FLU/RSV plus assay is intended as an aid in the diagnosis of influenza from Nasopharyngeal swab specimens and should not be used as a sole basis for treatment. Nasal washings and aspirates are unacceptable for Xpert Xpress SARS-CoV-2/FLU/RSV testing.  Fact Sheet for Patients: BloggerCourse.com  Fact Sheet for Healthcare Providers: SeriousBroker.it  This test is not yet approved or cleared by the [place] FDA and has been authorized for detection  and/or diagnosis of SARS-CoV-2 by FDA under an Emergency Use Authorization (EUA). This EUA will remain in effect (meaning this test can be used) for the duration of the COVID-19 declaration under Section 564(b)(1) of the Act, 21 U.S.C. section 360bbb-3(b)(1), unless the authorization is terminated or revoked.     Resp Syncytial Virus by PCR NEGATIVE NEGATIVE Final    Comment: (NOTE) Fact Sheet for Patients: BloggerCourse.com  Fact Sheet for Healthcare Providers: SeriousBroker.it  This test is not yet approved or cleared by the [place] FDA and has been authorized for detection and/or diagnosis of SARS-CoV-2 by FDA under an Emergency Use Authorization (EUA). This EUA will remain in effect (meaning this test can be used) for the duration of the COVID-19 declaration under Section 564(b)(1) of the Act, 21 U.S.C. section 360bbb-3(b)(1), unless the authorization is terminated or revoked.  Performed at [facility], [phone]  [address] [place], [place] [address]   Urine Culture     Status: Abnormal   Collection Time: 06/14/23  7:43 PM   Specimen: Urine, Clean Catch  Result Value Ref Range Status   Specimen Description   Final    URINE, CLEAN CATCH Performed at [facility], [address], [place], [place] [address]    Special Requests   Final    NONE Performed at [facility], [address] [place], [place] [address]    Culture (A)  Final    <10,000 COLONIES/mL INSIGNIFICANT GROWTH Performed at [facility], [address], [place], [place] [address]    Report Status 06/16/2023 FINAL  Final  Culture, blood (Routine X 2) w Reflex to ID Panel     Status: None   Collection Time: 06/16/23  1:20 AM   Specimen: BLOOD LEFT ARM  Result Value Ref Range Status   Specimen Description BLOOD LEFT ARM  Final   Special Requests   Final    BOTTLES DRAWN AEROBIC AND ANAEROBIC Blood Culture results may not be optimal due to an inadequate  volume of blood received in culture bottles   Culture   Final    NO GROWTH 5 DAYS Performed at [facility], [address] [place], [place] [address]    Report Status 06/21/2023 FINAL  Final  Culture, blood (Routine X 2) w Reflex to ID Panel     Status: None   Collection Time: 06/16/23  1:20 AM   Specimen: BLOOD RIGHT ARM  Result Value Ref Range Status   Specimen Description BLOOD RIGHT ARM  Final   Special Requests   Final    BOTTLES DRAWN AEROBIC AND ANAEROBIC Blood Culture results may not be optimal due to an inadequate volume of blood received in culture bottles   Culture   Final    NO GROWTH 5 DAYS Performed at [facility], [address], [place], [place] [address]    Report Status 06/21/2023 FINAL  Final  Culture, blood (Routine X 2) w Reflex to ID Panel     Status: None   Collection Time: 06/16/23  1:47 PM   Specimen: BLOOD  Result Value Ref Range Status   Specimen Description BLOOD BLOOD RIGHT ARM  Final   Special Requests   Final    BOTTLES DRAWN AEROBIC AND ANAEROBIC Blood Culture adequate volume   Culture   Final    NO GROWTH 5 DAYS Performed at [facility], [address] [place], [place] [address]    Report Status 06/21/2023 FINAL  Final  Culture, blood (Routine X 2) w Reflex to ID Panel     Status: None   Collection Time: 06/16/23  1:48 PM   Specimen: BLOOD  Result Value Ref Range Status   Specimen Description BLOOD BLOOD LEFT ARM  Final   Special Requests   Final    BOTTLES DRAWN AEROBIC AND ANAEROBIC Blood Culture adequate volume   Culture   Final    NO GROWTH 5 DAYS Performed at [facility], [address] [place], [place] [address]    Report Status 06/21/2023 FINAL  Final  MRSA Next Gen by PCR, Nasal     Status: None   Collection Time: 06/18/23  1:03 PM   Specimen: Nasal Mucosa; Nasal Swab  Result Value Ref Range Status   MRSA by PCR Next Gen NOT DETECTED NOT DETECTED Final    Comment: (NOTE) The GeneXpert MRSA Assay  (  FDA approved for NASAL specimens only), is one component of a comprehensive MRSA colonization surveillance program. It is not intended to diagnose MRSA infection nor to guide or monitor treatment for MRSA infections. Test performance is not FDA approved in patients less than 19 years old. Performed at [facility], [address], [place], [place] [address]   Surgical PCR screen     Status: None   Collection Time: 06/20/23  5:18 PM   Specimen: Nasal Mucosa; Nasal Swab  Result Value Ref Range Status   MRSA, PCR NEGATIVE NEGATIVE Final   Staphylococcus aureus NEGATIVE NEGATIVE Final    Comment: (NOTE) The Xpert SA Assay (FDA approved for NASAL specimens in patients 35 years of age and older), is one component of a comprehensive surveillance program. It is not intended to diagnose infection nor to guide or monitor treatment. Performed at [facility], [address], [place], [place] [address]     Labs: CBC: Recent Labs  Lab 06/18/23 [id number] 06/19/23 0436 06/20/23 0443 06/21/23 0508 06/22/23 0539  WBC 9.9 9.6 9.2 8.1 8.5  NEUTROABS 8.1* 7.6 7.4 6.3 7.5  HGB 8.8* 8.4* 8.8* 9.2* 8.9*  HCT 27.3* 26.1* 26.0* 27.9* 27.2*  MCV 82.7 82.9 81.8 82.3 81.7  PLT 285 286 310 299 300   Basic Metabolic Panel: Recent Labs  Lab 06/17/23 0559 06/18/23 0531 06/19/23 0436 06/20/23 0443 06/21/23 0508 06/22/23 0539  NA 135 136 136 134* 137 133*  K 3.9 4.3 3.6 3.5 3.8 4.0  CL 103 107 105 108 110 107  CO2 22 23 20* 20* 20* 21*  GLUCOSE 177* 156* 155* 160* 152* 178*  BUN 17 15 20 17 13 15   CREATININE 0.91 1.02 1.05 1.07 0.80 0.99  CALCIUM 8.2* 8.3* 8.4* 8.2* 8.4* 8.4*  MG 2.1 2.0 2.0 1.9 2.0  --   PHOS  --  3.2 3.5 3.1 3.0  --    Liver Function Tests: Recent Labs  Lab 06/18/23 0531 06/19/23 0436 06/20/23 0443 06/21/23 0508 06/22/23 0539  AST 23 27 34 33 30  ALT 29 35 48* 51* 45*  ALKPHOS 64 59 66 69 72  BILITOT 0.7 0.7 0.7 0.6 0.6  PROT 5.0* 4.9* 5.1* 5.1* 5.3*   ALBUMIN 2.1* 2.0* 2.0* 2.0* 2.1*   CBG: Recent Labs  Lab 06/21/23 1639 06/21/23 1731 06/21/23 2107 06/22/23 0723 06/22/23 1212  GLUCAP 160* 174* 230* 156* 156*    Discharge time spent: greater than 30 minutes.  Signed: Jonah Blue, MD [facility] 06/22/2023

## 2023-06-22 NOTE — Progress Notes (Signed)
 Occupational Therapy Treatment Patient Details Name: Grant Bowers MRN: [id number] DOB: 1945-05-30 Today's Date: 06/22/2023   History of present illness Pt is 78 yo presenting to [facility] ED on 3/6 due to increased pain in LB with concerns for discitis. 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 TEE 3/10. PMH: DM II, HTN, mixed hyperlipidemia, LBP, trifascicular block s/p PPM, OSA on CPAP, GERD.   OT comments  Pt. Seen for skilled OT treatment session.  Cont. Education on log roll for increased comfort and independence with bed mobility.  Required min/mod a for trunk support when transitioning into sitting.  Able to ambulate in room with min a and cues for RW management.  Notable difficulty transitioning from standing to sitting. Max cues and education for hand placement reaching back before sitting down.  Pt. Eager for home if/when able.  Cont. With acute OT POC while here.        If plan is discharge home, recommend the following:  A lot of help with bathing/dressing/bathroom;Assistance with cooking/housework;Assist for transportation;Help with stairs or ramp for entrance;A little help with walking and/or transfers   Equipment Recommendations  BSC/3in1;Wheelchair (measurements OT);Wheelchair cushion (measurements OT);Other (comment);Hospital bed    Recommendations for Other Services      Precautions / Restrictions Precautions Precautions: Fall       Mobility Bed Mobility Overal bed mobility: Needs Assistance Bed Mobility: Rolling, Sidelying to Sit, Sit to Sidelying Rolling: Min assist, Mod assist Sidelying to sit: Min assist, Mod assist     Sit to sidelying: Min assist, Mod assist General bed mobility comments: cont. education for log roll tech. greater assist to roll and also support to guide trunk upright.  less assist for log roll back into bed    Transfers Overall transfer level: Needs assistance Equipment used: Rolling walker (2 wheels) Transfers: Sit to/from Stand, Bed to  chair/wheelchair/BSC Sit to Stand: Min assist     Step pivot transfers: Min assist     General transfer comment: cues for hand placement, pt. taking hand from rw and slowly dragging them to the lower portion of the rw then bringing ues back to the bed before sititng down. states he usually does just "plop" down. reviewed importance of reaching back to 1. know he made it close enough and 2. it gives his body the idea of how low the surface is to prepare him how to sit.     Balance                                           ADL either performed or assessed with clinical judgement   ADL Overall ADL's : Needs assistance/impaired                     Lower Body Dressing: Sitting/lateral leans;Contact guard assist Lower Body Dressing Details (indicate cue type and reason): able to figure 4 and reach each LE Toilet Transfer: Minimal assistance;Rolling walker (2 wheels);Contact guard assist;Ambulation Statistician Details (indicate cue type and reason): able to ambulate to the door and back to bed, declined need for use of toilet. reports distance from bed to door in room is same as distance he needs to make at home         Functional mobility during ADLs: Minimal assistance;Rolling walker (2 wheels) General ADL Comments: cues for rw managment and safe tech. when transitioning into sitting ie:  reach back before sitting down    Extremity/Trunk Assessment              Vision       Perception     Praxis     Communication Communication Communication: No apparent difficulties   Cognition Arousal: Alert Behavior During Therapy: WFL for tasks assessed/performed Cognition: No apparent impairments             OT - Cognition Comments: WFL, does benefit from cues for safety and problem solving strategies but likely distracted by pain levels                 Following commands: Intact        Cueing   Cueing Techniques: Verbal cues, Tactile  cues  Exercises      Shoulder Instructions       General Comments      Pertinent Vitals/ Pain       Pain Assessment Pain Assessment: Faces Faces Pain Scale: Hurts little more Pain Location: B hips, back Pain Descriptors / Indicators: Grimacing, Sharp, Sore, Shooting, Moaning Pain Intervention(s): Monitored during session, Repositioned, Limited activity within patient's tolerance  Home Living                                          Prior Functioning/Environment              Frequency  Min 1X/week        Progress Toward Goals  OT Goals(current goals can now be found in the care plan section)  Progress towards OT goals: Progressing toward goals     Plan      Co-evaluation                 AM-PAC OT "6 Clicks" Daily Activity     Outcome Measure   Help from another person eating meals?: None Help from another person taking care of personal grooming?: A Little Help from another person toileting, which includes using toliet, bedpan, or urinal?: A Lot Help from another person bathing (including washing, rinsing, drying)?: A Lot Help from another person to put on and taking off regular upper body clothing?: A Little Help from another person to put on and taking off regular lower body clothing?: A Lot 6 Click Score: 16    End of Session Equipment Utilized During Treatment: Gait belt;Rolling walker (2 wheels)  OT Visit Diagnosis: Unsteadiness on feet (R26.81);Other abnormalities of gait and mobility (R26.89);Muscle weakness (generalized) (M62.81);Pain Pain - part of body: Hip   Activity Tolerance Patient tolerated treatment well   Patient Left in bed;with call bell/phone within reach;with bed alarm set   Nurse Communication Other (comment) (rn states ok to work with pt. no precautions or restrictions. provided mobility/session updates after session)        Time: 1225-1243 OT Time Calculation (min): 18 min  Charges: OT General  Charges $OT Visit: 1 Visit OT Treatments $Self Care/Home Management : 8-22 mins  Boneta Lucks, COTA/L Acute Rehabilitation [phone]   Alessandra Bevels Lorraine-COTA/L 06/22/2023, 1:31 PM

## 2023-06-22 NOTE — Discharge Instructions (Signed)
 After Your Pacemaker Removal    INCISION/Dressing  Monitor your Pacemaker site for redness, swelling, and drainage. Call the device clinic at [phone] if you experience these symptoms or fever/chills.  Monitor your R groin site for redness, swelling, drainage. The bandage can be removed 3/15. No vigorous exercise or activity for 1 week. No driving for 3 days.  Your incision is closed with sutures. You may shower 3 day after (Sunday, 3/16). Let warm soapy water run over incision. Please do not scrub the incision site.   Avoid lotions, ointments, or perfumes over your incision until it is well-healed.  You may use a hot tub or a pool AFTER your wound check appointment if the incision is completely closed.  Pacemaker Alerts:  Some alerts are vibratory and others beep. These are NOT emergencies. Please call our office to let us know. If this occurs at night or on weekends, it can wait until the next business day. Send a remote transmission.  If your device is capable of reading fluid status (for heart failure), you will be offered monthly monitoring to review this with you.    Call the office right away if: You have chest pain. You feel more short of breath than you have felt before. You feel more light-headed than you have felt before. Your incision starts to open up.  This information is not intended to replace advice given to you by your health care provider. Make sure you discuss any questions you have with your health care provider.

## 2023-06-22 NOTE — Plan of Care (Signed)

## 2023-06-22 NOTE — TOC Progression Note (Addendum)
 Transition of Care [facility]) - Progression Note    Patient Details  Name: Grant Bowers MRN: [id number] Date of Birth: 1945-05-06  [facility] CM/SW Contact  Graves-Bigelow, Lamar Laundry, RN Phone Number: 06/22/2023, 11:44 AM  Clinical Narrative: Case Manager received notification in progression rounds that the patient would be discharged home today. Patient is active with [facility] in [place] for PT. Case Manager called [phone] and spoke with Lorie to see if RN can be added. Disciplines added; However PT will not be able to see the patient until Monday and Rn will visit on Tuesday. HH RN/PT Orders will need to be faxed to [phone] with [employer] number on the fax cover sheet just in case the office needed assistance over the weekend @ [phone] for the [employer]. Case Manager then called the spouse and she was hesitant regarding discharge today because the patient was not ambulating much. Spouse feels that she cannot get the patient into the home. PT/OT to see the patient today and Case Manager will follow up with recommendations post visit. Medications being held via Passenger transport manager will contact [facility] Liaison Pam for delivery once patient is stable for transition home.   1604 06-22-23 IV antibiotics can be delivered to the hospital between 1800-1830-patient and family aware. Education has been completed via Engineer, drilling. Clinicals faxed to [facility]. No further needs identified.   Expected Discharge Plan: Home w Home Health Services Barriers to Discharge: Continued Medical Work up  Expected Discharge Plan and Services In-house Referral: Clinical Social Work Discharge Planning Services: CM Consult Post Acute Care Choice: Home Health Living arrangements for the past 2 months: Single Family Home                 DME Arranged: IV pump/equipment DME Agency:  Julianne Rice) Date DME Agency Contacted: 06/22/23 Time DME Agency Contacted:  1030 Representative spoke with at DME Agency: Pam HH Arranged: RN, Disease Management, PT HH Agency: [facility] Date [facility] Agency Contacted: 06/22/23 Time HH Agency Contacted: 1015 Representative spoke with at [facility] Agency: Lorie   Social Determinants of Health (SDOH) Interventions SDOH Screenings   Food Insecurity: No Food Insecurity (06/15/2023)  Housing: Low Risk  (06/15/2023)  Transportation Needs: No Transportation Needs (06/15/2023)  Utilities: Not At Risk (06/15/2023)  Depression (PHQ2-9): Low Risk  (04/17/2023)  Social Connections: Moderately Isolated (06/15/2023)  Tobacco Use: Low Risk  (06/21/2023)    Readmission Risk Interventions    06/15/2023    1:42 PM  Readmission Risk Prevention Plan  Transportation Screening Complete  HRI or Home Care Consult Complete  Social Work Consult for Recovery Care Planning/Counseling Complete  Palliative Care Screening Not Applicable  Medication Review Oceanographer) Complete

## 2023-06-22 NOTE — Progress Notes (Signed)
 While getting patient ready for discharge, patient was unable to get out of the bed without 9/10 pain in his back and hips. Patient expressed that he didn't think he could make it in/out of car or in the house. Patient's wife states that she cannot provide care for him in his current state. RN gave oxycodone to patient per request. Patient states that he was able to walk twice with therapy today, but that now the pain is so severe that he cannot move his hips. RN notified Dr. Ophelia Charter. Patient will stay overnight.

## 2023-06-22 NOTE — Progress Notes (Addendum)
 Patient Name: Grant Bowers Date of Encounter: 06/22/2023  Primary Cardiologist: Marjo Bicker, MD Electrophysiologist: Maurice Small, MD  Interval Summary   Viona Gilmore.  Device extraction yesterday with Dr. Lalla Brothers without complications.   Vital Signs    Vitals:   06/21/23 1755 06/21/23 1924 06/22/23 0000 06/22/23 0433  BP: 136/75 138/74 137/73 138/83  Pulse: (!) 103 100 (!) 101 100  Resp:  15 16 16   Temp:  97.7 F (36.5 C) 98.3 F (36.8 C) 97.7 F (36.5 C)  TempSrc:  Oral Oral Oral  SpO2: 97% 96% 96% 96%  Weight:      Height:        Intake/Output Summary (Last 24 hours) at 06/22/2023 0836 Last data filed at 06/22/2023 0400 Gross per 24 hour  Intake 1200 ml  Output 585 ml  Net 615 ml   Filed Weights   06/14/23 1645  Weight: 80.4 kg    Physical Exam    GEN- The patient is well appearing, alert and oriented x 3 today.  Lungs- CTA bilaterally, normal work of breathing Cardiac- Regular rate and rhythm, no murmurs, rubs or gallops GI- soft, NT, ND, + BS Extremities- no clubbing or cyanosis. No edema. R groin is soft, nontender. Outer bandage remains in place  L chest - outer bandage removed. Sutures to remain in place. Incision appears c/d/i   Telemetry    SR/ST w PACs at 80-100s  (personally reviewed)  Hospital Course    Willy Pinkerton is a 78 y.o. male history of T2DM, HTN, low back pain, trifascicular block s/p PPM, OSA on CPAP, GERD admitted for discitis, found to be bacteremic.  EP consulted for bacteremia with PPM in situ.   3/10 - device programmed to VVI 40 with 0% v pacing 3/13 - device extracted  Assessment & Plan    #) trifascicular block s/p St. Jude PPM #) Staph mitis/oralis bacteremic CT abd/pel concerning for vertebral discitis Blood cultures 2/2 positive for staph mitis/oralis TEE with negative lead vegetation, though with possible small sessile MV veg Device extracted 3/13 without incident L chest sutures to remain in place,  will be removed in clinic. R groin access with outer bandage in place, ok to remove  bandage 3/14 evening. OK to shower 3/14 evening. NO scrubbing  Continue abx as per ID  Recommend follow-up with general cardiology for severe MR in outpatient, will msg scheduler to coordinate EP outpatient appts scheduled    #) acute on chronic low back pain #) OSA on CPAP #) T2DM #) HTN Mgmt per primary service     EP will sign off at this time, but remains available. Please re-consult if needed    For questions or updates, please contact [facility] Please consult [email or web address] for contact info under Cardiology/STEMI.  Signed, Sherie Don, NP  06/22/2023, 8:36 AM   I have seen, examined the patient, and reviewed the above assessment and plan.    Interval: Patient underwent pacemaker system extraction yesterday. No overnight events. No new or acute complaints.   GEN: No acute distress.   Cardiac: Normal rate, regular rhythm. Left chest pocket without hematoma or bleeding.  Resp: Normal work of breathing.  R groin: Soft, without hematoma or bleeding.  Psych: Normal affect   Assessment: Mihail Prettyman is a 78 y.o. male history of T2DM, HTN, low back pain, trifascicular block s/p dual chamber pacemaker, OSA on CPAP, GERD who was admitted for discitis and was found to have staph bacteremia.   Patient underwent successful  device extraction on 3/13.   Problem List:  Staph mitis/oralis bacteremia Trifascicular block s/p St. Jude PPM   Plan: - Left chest sutures to be removed in 14 days.  - No heavy lifting, strenuous activity or exercise for 1 week due to groin access.  - No plans for reimplant at this time. Avoid bradycardia inducing medications. Continue to monitor rhythm in the outpatient setting.   EP will sign off. Feel free to reach out with any questions or clinical changes.   Nobie Putnam, MD 06/22/2023 10:13 PM

## 2023-06-22 NOTE — Progress Notes (Signed)
 PT Cancellation Note  Patient Details Name: Grant Bowers MRN: [id number] DOB: 05-20-1945   Cancelled Treatment:    Reason Eval/Treat Not Completed: Patient at procedure or test/unavailable (RN present in room for Peripherally Inserted Central Catheter Placement. Will follow-up as schedule permits.)  Cheri Guppy, PT, DPT Acute Rehabilitation Services Office: [phone] Secure Chat Preferred  Richardson Chiquito 06/22/2023, 11:28 AM

## 2023-06-22 NOTE — Progress Notes (Signed)
 Physical Therapy Treatment Patient Details Name: Grant Bowers MRN: [id number] DOB: April 27, 1945 Today's Date: 06/22/2023   History of Present Illness Pt is 78 yo presenting to [facility] ED on 3/6 due to increased pain in LB with concerns for discitis. 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 TEE 3/10. PMH: DM II, HTN, mixed hyperlipidemia, LBP, trifascicular block s/p PPM, OSA on CPAP, GERD.   PT Comments  Pt greeted supine in bed, pleasant and agreeable to PT session. He demonstrated improved functional mobility by decreased physical assistance required. He required VC throughout for increased safety awareness and proper sequencing. Pt completed bed mobility using the log roll technique with supervison-CGA. He utilized RW with CGA for STS and gait. Pt ambulated ~174ft at a reduced gait speed compared to baseline. Pt performed 8 standing marches with BUE support to simulate stair climbing. Will continue to follow acutely and advance appropriately. Pt will benefit from HHPT to improve balance, increase strength, and maximize independence and safety with functional mobility.     If plan is discharge home, recommend the following: Help with stairs or ramp for entrance;Assistance with cooking/housework;A little help with walking and/or transfers;A little help with bathing/dressing/bathroom;Assist for transportation   Can travel by private vehicle        Equipment Recommendations  Rolling walker (2 wheels)    Recommendations for Other Services       Precautions / Restrictions Precautions Precautions: Fall Recall of Precautions/Restrictions: Intact Restrictions Weight Bearing Restrictions Per Provider Order: No     Mobility  Bed Mobility Overal bed mobility: Needs Assistance Bed Mobility: Rolling, Sidelying to Sit, Sit to Sidelying Rolling: Supervision Sidelying to sit: Contact guard assist     Sit to sidelying: Supervision General bed mobility comments: Pt verbalized log roll technique. He  rolled onto his R side by bending LLE to push himself over. Pt brought BLE off EOB and required CGA under shoulder/trunk to achieve upright posture. Pt scooted fwd until feet supported. Returning to supine pt went down on his R shoulder, brought BLE back into bed, and rolled back onto his back.    Transfers Overall transfer level: Needs assistance Equipment used: Rolling walker (2 wheels) Transfers: Sit to/from Stand Sit to Stand: Contact guard assist, From elevated surface           General transfer comment: Educated pt on proper sequencing and positioning with RW. Pt stood from a slightly raised bed height to allow pt's B knees to be a 90 deg given his height. Pt would count up to 3 and power up, consistently bringing BUE onto RW prior to standing. Re-educated, but pt continued to perform STS this way. Good eccentric control with sitting.    Ambulation/Gait Ambulation/Gait assistance: Contact guard assist Gait Distance (Feet): 150 Feet Assistive device: Rolling walker (2 wheels) Gait Pattern/deviations: Step-through pattern, Knee flexed in stance - right, Knee flexed in stance - left, Decreased stride length, Narrow base of support Gait velocity: reduced Gait velocity interpretation: 1.31 - 2.62 ft/sec, indicative of limited community ambulator   General Gait Details: Pt ambulated initially with BLE kept in flex and NBOS. VC/TC for correct with improvement noticed with increased distance. Pt kept closer to the L inside RW. He took reciprocal steps, even weight shift, good foot clearence, and upright posture.   Stairs Stairs: Yes Stairs assistance: Contact guard assist Stair Management: Two rails Number of Stairs: 8 General stair comments: Simulated stair climbing by pt performing standing marches inside RW with BUE support and CGA.  Wheelchair Mobility     Tilt Bed    Modified Rankin (Stroke Patients Only)       Balance Overall balance assessment: Mild deficits  observed, not formally tested                                          Communication Communication Communication: No apparent difficulties  Cognition Arousal: Alert Behavior During Therapy: WFL for tasks assessed/performed   PT - Cognitive impairments: No family/caregiver present to determine baseline                         Following commands: Intact      Cueing Cueing Techniques: Verbal cues, Tactile cues  Exercises      General Comments General comments (skin integrity, edema, etc.): Pt tachycardic with activity, HRmax 145bpm during session.      Pertinent Vitals/Pain Pain Assessment Pain Assessment: 0-10 Pain Score: 2  Pain Location: B hips and Back Pain Descriptors / Indicators: Aching, Discomfort, Sore Pain Intervention(s): Monitored during session    Home Living                          Prior Function            PT Goals (current goals can now be found in the care plan section) Acute Rehab PT Goals Patient Stated Goal: Return Home Progress towards PT goals: Progressing toward goals    Frequency    Min 2X/week      PT Plan      Co-evaluation              AM-PAC PT "6 Clicks" Mobility   Outcome Measure  Help needed turning from your back to your side while in a flat bed without using bedrails?: A Little Help needed moving from lying on your back to sitting on the side of a flat bed without using bedrails?: A Little Help needed moving to and from a bed to a chair (including a wheelchair)?: A Little Help needed standing up from a chair using your arms (e.g., wheelchair or bedside chair)?: A Little Help needed to walk in hospital room?: A Little Help needed climbing 3-5 steps with a railing? : A Little 6 Click Score: 18    End of Session Equipment Utilized During Treatment: Gait belt Activity Tolerance: Patient tolerated treatment well Patient left: in bed;with call bell/phone within reach;with bed  alarm set Nurse Communication: Mobility status PT Visit Diagnosis: Unsteadiness on feet (R26.81);Other abnormalities of gait and mobility (R26.89);Muscle weakness (generalized) (M62.81)     Time: 1610-9604 PT Time Calculation (min) (ACUTE ONLY): 28 min  Charges:    $Gait Training: 23-37 mins PT General Charges $$ ACUTE PT VISIT: 1 Visit                     Cheri Guppy, PT, DPT Acute Rehabilitation Services Office: [phone] Secure Chat Preferred    Richardson Chiquito 06/22/2023, 3:43 PM

## 2023-06-23 DIAGNOSIS — I330 Acute and subacute infective endocarditis: Secondary | ICD-10-CM | POA: Diagnosis not present

## 2023-06-23 DIAGNOSIS — B955 Unspecified streptococcus as the cause of diseases classified elsewhere: Secondary | ICD-10-CM | POA: Diagnosis not present

## 2023-06-23 DIAGNOSIS — M4620 Osteomyelitis of vertebra, site unspecified: Secondary | ICD-10-CM | POA: Diagnosis not present

## 2023-06-23 LAB — CBC WITH DIFFERENTIAL/PLATELET
Abs Immature Granulocytes: 0.05 10*3/uL (ref 0.00–0.07)
Basophils Absolute: 0 10*3/uL (ref 0.0–0.1)
Basophils Relative: 0 %
Eosinophils Absolute: 0.1 10*3/uL (ref 0.0–0.5)
Eosinophils Relative: 2 %
HCT: 28.1 % — ABNORMAL LOW (ref 39.0–52.0)
Hemoglobin: 9.2 g/dL — ABNORMAL LOW (ref 13.0–17.0)
Immature Granulocytes: 1 %
Lymphocytes Relative: 15 %
Lymphs Abs: 1.2 10*3/uL (ref 0.7–4.0)
MCH: 27.3 pg (ref 26.0–34.0)
MCHC: 32.7 g/dL (ref 30.0–36.0)
MCV: 83.4 fL (ref 80.0–100.0)
Monocytes Absolute: 0.7 10*3/uL (ref 0.1–1.0)
Monocytes Relative: 8 %
Neutro Abs: 6 10*3/uL (ref 1.7–7.7)
Neutrophils Relative %: 74 %
Platelets: 288 10*3/uL (ref 150–400)
RBC: 3.37 MIL/uL — ABNORMAL LOW (ref 4.22–5.81)
RDW: 15.5 % (ref 11.5–15.5)
WBC: 8 10*3/uL (ref 4.0–10.5)
nRBC: 0 % (ref 0.0–0.2)

## 2023-06-23 LAB — GLUCOSE, CAPILLARY
Glucose-Capillary: 138 mg/dL — ABNORMAL HIGH (ref 70–99)
Glucose-Capillary: 151 mg/dL — ABNORMAL HIGH (ref 70–99)
Glucose-Capillary: 215 mg/dL — ABNORMAL HIGH (ref 70–99)
Glucose-Capillary: 122 mg/dL — ABNORMAL HIGH (ref 70–99)

## 2023-06-23 LAB — BASIC METABOLIC PANEL
Anion gap: 5 (ref 5–15)
BUN: 15 mg/dL (ref 8–23)
CO2: 23 mmol/L (ref 22–32)
Calcium: 8.2 mg/dL — ABNORMAL LOW (ref 8.9–10.3)
Chloride: 107 mmol/L (ref 98–111)
Creatinine, Ser: 0.9 mg/dL (ref 0.61–1.24)
GFR, Estimated: >60 mL/min (ref >60)
Glucose, Bld: 132 mg/dL — ABNORMAL HIGH (ref 70–99)
Potassium: 3.9 mmol/L (ref 3.5–5.1)
Sodium: 135 mmol/L (ref 135–145)

## 2023-06-23 NOTE — TOC Progression Note (Signed)
 Transition of Care [facility]) - Progression Note    Patient Details  Name: Grant Bowers MRN: [id number] Date of Birth: 07-14-1945  [facility] of [facility]) CM/SW Contact  Ronny Bacon, RN Phone Number: 06/23/2023, 8:04 AM  Clinical Narrative:  Home health orders faxed to [facility] @ [phone] with confirmation receipt.    Expected Discharge Plan: Home w Home Health Services Barriers to Discharge: Continued Medical Work up  Expected Discharge Plan and Services In-house Referral: Clinical Social Work Discharge Planning Services: CM Consult Post Acute Care Choice: Home Health Living arrangements for the past 2 months: Single Family Home Expected Discharge Date: 06/22/23               DME Arranged: IV pump/equipment DME Agency:  Julianne Rice) Date DME Agency Contacted: 06/22/23 Time DME Agency Contacted: 1030 Representative spoke with at DME Agency: Pam HH Arranged: RN, Disease Management, PT HH Agency: [facility] Date [facility] Agency Contacted: 06/22/23 Time HH Agency Contacted: 1015 Representative spoke with at [facility] Agency: Lorie   Social Determinants of Health (SDOH) Interventions SDOH Screenings   Food Insecurity: No Food Insecurity (06/15/2023)  Housing: Low Risk  (06/15/2023)  Transportation Needs: No Transportation Needs (06/15/2023)  Utilities: Not At Risk (06/15/2023)  Depression (PHQ2-9): Low Risk  (04/17/2023)  Social Connections: Moderately Isolated (06/15/2023)  Tobacco Use: Low Risk  (06/21/2023)    Readmission Risk Interventions    06/15/2023    1:42 PM  Readmission Risk Prevention Plan  Transportation Screening Complete  HRI or Home Care Consult Complete  Social Work Consult for Recovery Care Planning/Counseling Complete  Palliative Care Screening Not Applicable  Medication Review Oceanographer) Complete

## 2023-06-23 NOTE — TOC Transition Note (Signed)
 Transition of Care [facility]) - Discharge Note   Patient Details  Name: Grant Bowers MRN: [id number] Date of Birth: May 16, 1945  Transition of Care [facility]) CM/SW Contact:  Helene Kelp, LCSW Phone Number: 06/23/2023, 2:57 PM   Clinical Narrative:    CSW followed-up disposition recommendations (SNF placement).   CSW spoke with the patient to review SNF referral process per clinical recommendations and assessed the pt's SNF preference.   The CSW contacted the patient's natural (Spouse Bowers,Grant /  or [phone]) and reviewed the clinical recommendations and assess SNF preference. The natural support expressed   The patient's wife Grant Bowers), expressed she spoke with a provider and thought the pt was able to go to CIR. After processing with the wife, she stated she doesn't know much about the Skilled nursing facilities in [place], because the pt's medical treatment is in [place], [place]. The wife notes she would like for their to be a plan B, if the pt cannot be considered for CIR. The wife notes she would be ok with local SNF referral, but is not pleased with this being the main disposition plan. CSW noted he will complete the local [facility], [place]) referrals   CSW referral efforts to support the patient's disposition SNF referrals: completed  Note: FL2 and PASSR is pending ocmpletion due to the pt residing in [place] and cross state SNF placement criteria to be reviewed with the pt and wife and TOC/unit clinical team.   TOC Disposition follow-up needs  Please provide the patient or natural support with bed-offer updates.  Please continue with SNF placement efforts. Please update the clinical team to SNF placement efforts:  No other needs identified by this Clinical research associate currently. Patient needs and current disposition to be followed by     Final next level of care: Home/Self Care Barriers to Discharge: Continued Medical Work up   Patient Goals and CMS Choice Patient states their  goals for this hospitalization and ongoing recovery are:: return back home   Choice offered to / list presented to : Patient      Discharge Placement                       Discharge Plan and Services Additional resources added to the After Visit Summary for   In-house Referral: Clinical Social Work Discharge Planning Services: CM Consult Post Acute Care Choice: Home Health          DME Arranged: IV pump/equipment DME Agency:  Julianne Rice) Date DME Agency Contacted: 06/22/23 Time DME Agency Contacted: 1030 Representative spoke with at DME Agency: Pam HH Arranged: RN, Disease Management, PT HH Agency: [facility] Date [facility] Agency Contacted: 06/22/23 Time HH Agency Contacted: 1015 Representative spoke with at [facility] Agency: Lorie  Social Drivers of Health (SDOH) Interventions SDOH Screenings   Food Insecurity: No Food Insecurity (06/15/2023)  Housing: Low Risk  (06/15/2023)  Transportation Needs: No Transportation Needs (06/15/2023)  Utilities: Not At Risk (06/15/2023)  Depression (PHQ2-9): Low Risk  (04/17/2023)  Social Connections: Moderately Isolated (06/15/2023)  Tobacco Use: Low Risk  (06/21/2023)     Readmission Risk Interventions    06/15/2023    1:42 PM  Readmission Risk Prevention Plan  Transportation Screening Complete  HRI or Home Care Consult Complete  Social Work Consult for Recovery Care Planning/Counseling Complete  Palliative Care Screening Not Applicable  Medication Review Oceanographer) Complete

## 2023-06-23 NOTE — Progress Notes (Signed)
 Inpatient Rehab Admissions Coordinator:    CIR consult received. At this time, Pt. Is pain limited and appears unable to tolerate the intensity of CIR, though note tolerance was better yesterday, so he may progress well once pain is better managed. I will follow for progress and participation with therapies, but if ready for d/c now, TOC will need to seen SNF placement.   Megan Salon, MS, CCC-SLP Rehab Admissions Coordinator  [phone] (celll) [phone] (office)

## 2023-06-23 NOTE — Progress Notes (Signed)
 Progress Note   Patient: Grant Bowers ZHY:[id number] DOB: 1946-03-07 DOA: 06/14/2023     8 DOS: the patient was seen and examined on 06/23/2023   Brief hospital course: 78yo with h/o DM, HTN, HLD, chronic LBP on oxycodone, pacemaker placement, OSA on CPAP, and GERD who presented on 3/6 with worsening back pain.  He was recently admitted from 2/23 to 2/25 due to cholelithiasis without acute cholecystitis which was treated symptomatically.  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  He underwent pacemaker extraction on 3/13.  Plan for PICC line post-extraction for continued IV abx for 6 weeks followed by PO Cefadroxil x 2 weeks.  Needs outpatient dental follow up.   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 Case was discussed with neurosurgery who recommended MRI Patient has a pacemaker so he was transferred to [facility]; however, unable to get MRI due to type of pacemaker and lead position Likely source is dental infection ID recommended echo -> TEE which showed a MV vegetation Repeat blood cultures negative x 5 days Plan is for Ceftriaxone x 6 weeks (EOT 4/19) and then Cefadroxil x 2 weeks ID f/u on 4/3 F/u with CT surgery after completion of antibiotics and dental extraction(s)  Poor mobility Patient was able to ambulate with PT/OT yesterday and did well However, after return to bed he was unable to get up again and so did not discharge to home with [facility] as planned PT has assessed today and he is unable to push up to sit or bring his legs off the bed due to extreme pain, was unable to stand or walk today Will consult CIR, as he appears to be unable to consistently care for himself at home in  this circumstance and is likely to benefit from rehab Pain control at this time is with oxy, Dilaudid, Lidoderm, and Robaxin - and so should be adequate but somehow is not at this time   Pacemaker Removed on 3/14 Will need outpatient cardiology and EP follow up, cardiology will arrange Continue ASA Mild tachycardia noted today on exam - ? Etiology, will follow   Poor dentition Needs dental extraction, hopefully prior to completion of antibiotics to reduce future infection risk   Normocytic anemia No evidence of overt bleeding Appears to be generally stable but will need to be followed as outpatient   Essential hypertension Continue amlodipine and irbesartan Continue to hold HCTZ BP 112/77, good control   BPH Continue alfuzosin, Detrol   Hyperlipidemia Continue rosuvastatin   Recently diagnosed cholelithiasis Outpatient follow-up with general surgery.   Diabetes mellitus type 2 with hyperglycemia HbA1c is 6.2, good control Resume metformin at time of dc Continue SSI in the hospital             Consultants: ID Cardiology/EP CT surgery OT PT TOC team   Procedures: Echocardiogram 3/9 TEE 3/10 Lead extraction, pacemaker removal 3/13   Antibiotics: Cefazolin x 2 doses Zosyn x 1 dose Vancomycin x 1 dose Ceftriaxone 3/7-  30 Day Unplanned Readmission Risk Score    Flowsheet Row ED to Hosp-Admission (Current) from 06/14/2023 in [place] 6E Progressive Care  30 Day Unplanned Readmission Risk Score (%) 27.84 Filed at 06/23/2023 0400       This score is the patient's risk of an  unplanned readmission within 30 days of being discharged (0 -100%). The score is based on dignosis, 79% age, lab data, medications, orders, and past utilization.   Low:  0-14.9   Medium: 15-21.9   High: 22-29.9   Extreme: 30 and above           Subjective: Patient was discharged yesterday after success with PT.  However, at the time of 78yo discharge he reported that he thought he overdid  it with PT and was unable to get OOB and so would not be successful at home and so he remained hospitalized.   Objective: Vitals:   06/22/23 1949 06/23/23 0616  BP: (!) 147/75 112/77  Pulse: 95 95  Resp: 18 20  Temp: 97.9 F (36.6 C) 97.9 F (36.6 C)  SpO2: 96% 95%    Intake/Output Summary (Last 24 hours) at 06/23/2023 1210 Last data filed at 06/23/2023 1102 Gross per 24 hour  Intake 3 ml  Output 600 ml  Net -597 ml   Filed Weights   06/14/23 1645  Weight: 80.4 kg    Exam:  General:  Appears calm and comfortable and is in NAD Eyes:  EOMI, normal lids, iris ENT:  grossly normal hearing, lips & tongue, mmm; suboptimal dentition Neck:  no LAD, masses or thyromegaly Cardiovascular:  RRR, no m/r/g. No LE edema.  Respiratory:   CTA bilaterally with no wheezes/rales/rhonchi.  Normal respiratory effort. Abdomen:  soft, NT, ND Skin:  no rash or induration seen on limited exam Musculoskeletal:  grossly normal tone BUE/BLE, good ROM, no bony abnormality Psychiatric:  grossly normal mood and affect, speech fluent and appropriate, AOx3 Neurologic:  CN 2-12 grossly intact, no gross abnormalities  Data Reviewed: I have reviewed the patient's lab results since admission.  Pertinent labs for today include:   Glucose 132 WBC 8 Hgb 9.2, stable    Family Communication: None present  Disposition: Status is: Inpatient Remains inpatient appropriate because: ongoing monitoring, likely to need placement     Time spent: 50 minutes  Unresulted Labs (From admission, onward)    None        Author: Jonah Blue, MD 06/23/2023 12:10 PM  For on call review [email or web address].

## 2023-06-23 NOTE — Plan of Care (Signed)

## 2023-06-23 NOTE — Progress Notes (Signed)
 Physical Therapy Treatment Patient Details Name: Grant Bowers MRN: [id number] DOB: [birth year] Today's Date: 06/23/2023   History of Present Illness Pt is 78 yo presenting to [facility] ED on 3/6 due to increased pain in LB with concerns for discitis. 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 TEE 3/10. PMH: DM II, HTN, mixed hyperlipidemia, LBP, trifascicular block s/p PPM, OSA on CPAP, GERD.    PT Comments  The pt is experiencing much more severe low back and bil hip pain this date. While the pt was limited in his mobility in general and unable to transfer to stand or ambulate this date due to the severity of his pain, he was motivated to try to participate and "do anything to improve and go home". He tried multiple times to transition sidelying to sit on each side of the bed, but ultimately needed total assist to complete the transition due to the extreme pain. He was able to sit EOB several minutes before needing to return to supine secondary to the pain. Hopefully, as his pain improves and remains more tolerable consistently, he will make good progress as he was doing very well with PT/OT yesterday. At this time, the pt may benefit from intensive inpatient rehab, > 3 hours/day, to manage his pain and maximize his consistency in independence prior to d/c home. His wife cannot physically manage him safely at his current functional level, but is willing "to do anything" to help. Will continue to follow acutely.     If plan is discharge home, recommend the following: Help with stairs or ramp for entrance;Assistance with cooking/housework;Assist for transportation;A lot of help with walking and/or transfers;A lot of help with bathing/dressing/bathroom   Can travel by private vehicle        Equipment Recommendations  Wheelchair (measurements PT);Wheelchair cushion (measurements PT) (pending progress)    Recommendations for Other Services Rehab consult     Precautions / Restrictions  Precautions Precautions: Fall Recall of Precautions/Restrictions: Intact Precaution/Restrictions Comments: back precautions for comfort? Restrictions Weight Bearing Restrictions Per Provider Order: No     Mobility  Bed Mobility Overal bed mobility: Needs Assistance Bed Mobility: Rolling, Sidelying to Sit, Sit to Sidelying Rolling: Supervision Sidelying to sit: Total assist     Sit to sidelying: Max assist General bed mobility comments: Pt was able to roll bil in bed with extra time and multi-modal cues to keep hips and shoulders together to try to reduce twisting and thus back pain. Pt rolled multiple times each direction with supervision for safety. Speed limited by pain. Pt reported feeling less pain rolling to L than R. Pt attempted to transition sidelying to sit L EOB 1x and R EOB 3x without success due to pain with just bringing legs off EOB. Provided multi-modal cues for pushing up on arms to ascend trunk while legs came off bed, but pt unable to tolerate completing without assistance. He ultimately required total assist to successfully transition sidelying to sit R EOB 1x as he actively resisted due to the severe pain. He was only able to tolerate sitting a few minutes before requesting to return to supine due to pain. MaxA needed to guide trunk and lift legs with return to sidelying.    Transfers                   General transfer comment: Pt unable to tolerate attempts at transfers due to severe pain this date.    Ambulation/Gait  General Gait Details: Pt unable to tolerate attempts at gait due to severe pain this date.   Stairs             Wheelchair Mobility     Tilt Bed    Modified Rankin (Stroke Patients Only)       Balance Overall balance assessment: Needs assistance Sitting-balance support: Bilateral upper extremity supported, Feet supported Sitting balance-Leahy Scale: Poor Sitting balance - Comments: Pt sat EOB only a few  minutes before returning to supine due to severe low back pain today. He heavily pushed through his hands on the bed to try to reduce the pain while sitting. It did improve slightly with time, but not enough to tolerate, even with pt leaning back against pillow propped by therapist at a point.       Standing balance comment: unable to tolerate                            Communication Communication Communication: No apparent difficulties  Cognition Arousal: Alert Behavior During Therapy: WFL for tasks assessed/performed   PT - Cognitive impairments: No family/caregiver present to determine baseline                         Following commands: Intact      Cueing Cueing Techniques: Verbal cues, Tactile cues, Visual cues, Gestural cues  Exercises      General Comments General comments (skin integrity, edema, etc.): educated pt and spouse on d/c options and coordinating sessions with pain meds if needed; educated pt on spinal precautions      Pertinent Vitals/Pain Pain Assessment Pain Assessment: 0-10 Pain Score: 9  Pain Location: Bil hips and Back Pain Descriptors / Indicators: Aching, Discomfort, Sore, Grimacing, Guarding, Moaning, Spasm, Shooting Pain Intervention(s): Limited activity within patient's tolerance, Monitored during session, Patient requesting pain meds-RN notified, Repositioned (pt received pain meds earlier this AM)    Home Living                          Prior Function            PT Goals (current goals can now be found in the care plan section) Acute Rehab PT Goals Patient Stated Goal: Return Home PT Goal Formulation: With patient/family Time For Goal Achievement: 07/01/23 Potential to Achieve Goals: Good Progress towards PT goals: Not progressing toward goals - comment (limited by pain this date)    Frequency    Min 3X/week      PT Plan      Co-evaluation              AM-PAC PT "6 Clicks" Mobility    Outcome Measure  Help needed turning from your back to your side while in a flat bed without using bedrails?: A Little Help needed moving from lying on your back to sitting on the side of a flat bed without using bedrails?: Total Help needed moving to and from a bed to a chair (including a wheelchair)?: Total Help needed standing up from a chair using your arms (e.g., wheelchair or bedside chair)?: Total Help needed to walk in hospital room?: Total Help needed climbing 3-5 steps with a railing? : Total 6 Click Score: 8    End of Session   Activity Tolerance: Patient limited by pain Patient left: in bed;with call bell/phone within reach;with bed alarm set;with family/visitor present Nurse  Communication: Mobility status;Patient requests pain meds PT Visit Diagnosis: Unsteadiness on feet (R26.81);Other abnormalities of gait and mobility (R26.89);Muscle weakness (generalized) (M62.81);Difficulty in walking, not elsewhere classified (R26.2);Pain Pain - Right/Left:  (back) Pain - part of body:  (back)     Time: 1914-7829 PT Time Calculation (min) (ACUTE ONLY): 55 min  Charges:    $Therapeutic Activity: 53-67 mins PT General Charges $$ ACUTE PT VISIT: 1 Visit                     Virgil Benedict, PT, DPT Acute Rehabilitation Services  Office: [phone]    Bettina Gavia 06/23/2023, 12:41 PM

## 2023-06-24 DIAGNOSIS — M4620 Osteomyelitis of vertebra, site unspecified: Secondary | ICD-10-CM | POA: Diagnosis not present

## 2023-06-24 LAB — TYPE AND SCREEN
ABO/RH(D): A POS
Antibody Screen: NEGATIVE
Unit division: 0
Unit division: 0

## 2023-06-24 LAB — BPAM RBC
Blood Product Expiration Date: 202503192359
Blood Product Expiration Date: 202504072359
ISSUE DATE / TIME: 202503131432
ISSUE DATE / TIME: 202503131432
Unit Type and Rh: 6200
Unit Type and Rh: 6200

## 2023-06-24 LAB — GLUCOSE, CAPILLARY
Glucose-Capillary: 152 mg/dL — ABNORMAL HIGH (ref 70–99)
Glucose-Capillary: 161 mg/dL — ABNORMAL HIGH (ref 70–99)
Glucose-Capillary: 148 mg/dL — ABNORMAL HIGH (ref 70–99)
Glucose-Capillary: 171 mg/dL — ABNORMAL HIGH (ref 70–99)

## 2023-06-24 MED ORDER — KETOROLAC TROMETHAMINE 15 MG/ML IJ SOLN
15.0000 mg | Freq: Four times a day (QID) | INTRAMUSCULAR | Status: AC
  Administered 2023-06-24 – 2023-06-27 (×12): 15 mg via INTRAVENOUS
  Filled 2023-06-24 (×12): qty 1

## 2023-06-24 NOTE — Progress Notes (Signed)
 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  He was recently admitted from 2/23 to 2/25 due to cholelithiasis without acute cholecystitis which was treated symptomatically.  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  He underwent pacemaker extraction on 3/13.  Plan for PICC line post-extraction for continued IV abx for 6 weeks followed by PO Cefadroxil x 2 weeks.  Needs outpatient dental follow up.   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 Case was discussed with neurosurgery who recommended MRI Patient has a pacemaker so he was transferred to [facility]; however, unable to get MRI due to type of pacemaker and lead position Likely source is dental infection ID recommended echo -> TEE which showed a MV vegetation Repeat blood cultures negative x 5 days Plan is for Ceftriaxone x 6 weeks (EOT 4/19) and then Cefadroxil x 2 weeks ID f/u on 4/3 F/u with CT surgery after completion of antibiotics and dental extraction(s) He reports that intractable pain is his primary issue, will try Toradol x 3 days Will also reach out to IR to see if there are any interventions that might improve pain to allow him to effectively participate in rehab; however, epidural steroid injections would be contraindicated in the setting of active infection and I am not aware of other options   Poor mobility Patient was able to ambulate with PT/OT yesterday and  did well However, after return to bed he was unable to get up again and so did not discharge to home with [facility] as planned PT has assessed today and he is unable to push up to sit or bring his legs off the bed due to extreme pain, was unable to stand or walk since CIR consulted but he is unlikely to be able to participate in 3 hours of therapy daily and so may need SNF rehab Pain control at this time is with oxy, Dilaudid, Lidoderm, and Robaxin - and so should be adequate but somehow is not at this time; will add Toradol   Pacemaker Removed on 3/14 Will need outpatient cardiology and EP follow up, cardiology will arrange Continue ASA Mild tachycardia noted today on exam - ? Etiology, will follow   Poor dentition Needs dental extraction, hopefully prior to completion of antibiotics to reduce future infection risk   Normocytic anemia No evidence of overt bleeding Appears to be generally stable but will need to be followed as outpatient   Essential hypertension Continue amlodipine and irbesartan Continue to hold HCTZ BP 112/77, good control   BPH Continue alfuzosin, Detrol   Hyperlipidemia Continue rosuvastatin   Recently diagnosed cholelithiasis Outpatient follow-up with general surgery.   Diabetes mellitus type 2 with hyperglycemia HbA1c is 6.2, good control Resume metformin at time of dc Continue SSI in the hospital             Consultants: ID Cardiology/EP CT surgery OT PT TOC team   Procedures: Echocardiogram 3/9 TEE 3/10 Lead extraction, pacemaker removal 3/13   Antibiotics: Cefazolin x 2  doses Zosyn x 1 dose Vancomycin x 1 dose Ceftriaxone 3/7-    30 Day Unplanned Readmission Risk Score    Flowsheet Row ED to Hosp-Admission (Current) from 06/14/2023 in [place] 6E Progressive Care  30 Day Unplanned Readmission Risk Score (%) 26.11 Filed at 06/24/2023 0801       This score is the patient's risk of an unplanned readmission within 30 days of being  discharged (0 -100%). The score is based on dignosis, age, lab data, medications, orders, and past utilization.   Low:  0-14.9   Medium: 15-21.9   High: 22-29.9   Extreme: 30 and above           Subjective: Refractory pain that limits his ability to move.   Objective: Vitals:   06/23/23 2006 06/24/23 0603  BP: 124/67 (!) 150/68  Pulse: (!) 101 (!) 110  Resp: 18 16  Temp: 98 F (36.7 C) 98.1 F (36.7 C)  SpO2: 100% 95%    Intake/Output Summary (Last 24 hours) at 06/24/2023 1413 Last data filed at 06/24/2023 1610 Gross per 24 hour  Intake 357 ml  Output 850 ml  Net -493 ml   Filed Weights   06/14/23 1645  Weight: 80.4 kg    Exam:  General:  Appears calm and comfortable and is in NAD while at rest, winces with movement Eyes:  EOMI, normal lids, iris ENT:  grossly normal hearing, lips & tongue, mmm; suboptimal dentition Neck:  no LAD, masses or thyromegaly Cardiovascular:  RRR, no m/r/g. No LE edema.  Respiratory:   CTA bilaterally with no wheezes/rales/rhonchi.  Normal respiratory effort. Abdomen:  soft, NT, ND Skin:  no rash or induration seen on limited exam Musculoskeletal:  grossly normal tone BUE/BLE, good ROM, no bony abnormality Psychiatric:  grossly normal mood and affect, speech fluent and appropriate, AOx3 Neurologic:  CN 2-12 grossly intact, symmetric BLE weakness due to back pain, normal BUE strength  Data Reviewed: I have reviewed the patient's lab results since admission.  Pertinent labs for today include:   None     Family Communication: None present  Disposition: Status is: Inpatient Remains inpatient appropriate because: ongoing management     Time spent: 50 minutes  Unresulted Labs (From admission, onward)     Start     Ordered   06/25/23 0500  CBC with Differential/Platelet  Tomorrow morning,   R       Question:  Specimen collection method  Answer:  Lab=Lab collect   06/24/23 1412   06/25/23 0500  Basic metabolic panel  Tomorrow  morning,   R       Question:  Specimen collection method  Answer:  Lab=Lab collect   06/24/23 1412             Author: Jonah Blue, MD 06/24/2023 2:13 PM  For on call review [email or web address].

## 2023-06-24 NOTE — Anesthesia Postprocedure Evaluation (Signed)
 Anesthesia Post Note  Patient: Grant Bowers  Procedure(s) Performed: LEAD EXTRACTION PPM GENERATOR REMOVAL     Patient location during evaluation: PACU Anesthesia Type: General Level of consciousness: awake and alert Pain management: pain level controlled Vital Signs Assessment: post-procedure vital signs reviewed and stable Respiratory status: spontaneous breathing, nonlabored ventilation, respiratory function stable and patient connected to nasal cannula oxygen Cardiovascular status: blood pressure returned to baseline and stable Postop Assessment: no apparent nausea or vomiting Anesthetic complications: no   There were no known notable events for this encounter.  Last Vitals:  Vitals:   06/23/23 2006 06/24/23 0603  BP: 124/67 (!) 150/68  Pulse: (!) 101 (!) 110  Resp: 18 16  Temp: 36.7 C 36.7 C  SpO2: 100% 95%    Last Pain:  Vitals:   06/24/23 0850  TempSrc:   PainSc: 6                  Kennieth Rad

## 2023-06-24 NOTE — Progress Notes (Signed)
 Physical Therapy Treatment Patient Details Name: Grant Bowers MRN: [id number] DOB: [birth year] Today's Date: 06/24/2023   History of Present Illness Pt is 78 yo presenting to [facility] ED on 3/6 due to increased pain in LB with concerns for discitis. 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 TEE 3/10. PMH: DM II, HTN, mixed hyperlipidemia, LBP, trifascicular block s/p PPM, OSA on CPAP, GERD.    PT Comments  The pt was agreeable to session despite reports of continued pain. He was able to engage well with core activation exercises with minimal changes in pain, tolerated transition to sitting EOB x2 and a single attempt at standing. He continues to need modA with bed mobility and sit-stand transfers due to pain, and will benefit from continued skilled PT to address strength, pain, activity tolerance, and return to gait training.      If plan is discharge home, recommend the following: Help with stairs or ramp for entrance;Assistance with cooking/housework;Assist for transportation;A lot of help with walking and/or transfers;A lot of help with bathing/dressing/bathroom   Can travel by private vehicle        Equipment Recommendations  Wheelchair (measurements PT);Wheelchair cushion (measurements PT) (pending progress)    Recommendations for Other Services       Precautions / Restrictions Precautions Precautions: Fall Recall of Precautions/Restrictions: Intact Precaution/Restrictions Comments: back precautions for comfort? Restrictions Weight Bearing Restrictions Per Provider Order: No     Mobility  Bed Mobility Overal bed mobility: Needs Assistance Bed Mobility: Rolling, Sidelying to Sit, Sit to Sidelying Rolling: Min assist Sidelying to sit: Mod assist     Sit to sidelying: Min assist General bed mobility comments: pt able to roll with cues for abdominal bracing, movement of legs, and log roll. using bed rails to roll to L side. modA to elevate trunk to sitting. pt dependent on BUE  support due to pain with sitting. completed x2 in session. Pt reports less increase in pain when cued for abdominal bracing before mobility    Transfers Overall transfer level: Needs assistance Equipment used: Rolling walker (2 wheels) Transfers: Sit to/from Stand Sit to Stand: Mod assist, From elevated surface           General transfer comment: completed x1 with modA to rise. HR elevation to 139bpm, pt unable to tolerate standing for more than 3-5 seconds due to pain    Ambulation/Gait               General Gait Details: Pt unable to tolerate attempts at gait due to severe pain this date.   Stairs             Wheelchair Mobility     Tilt Bed    Modified Rankin (Stroke Patients Only)       Balance Overall balance assessment: Needs assistance Sitting-balance support: Bilateral upper extremity supported, Feet supported Sitting balance-Leahy Scale: Poor Sitting balance - Comments: Pt sat EOB only a few minutes before returning to supine due to severe low back pain today. He heavily pushed through his hands on the bed to try to reduce the pain while sitting.   Standing balance support: Bilateral upper extremity supported, During functional activity, Reliant on assistive device for balance Standing balance-Leahy Scale: Poor Standing balance comment: dependent on BUE support, limited standing tolerance due to pain. no overt buckling in stance                            Communication Communication Communication:  No apparent difficulties  Cognition Arousal: Alert Behavior During Therapy: WFL for tasks assessed/performed   PT - Cognitive impairments: No family/caregiver present to determine baseline                         Following commands: Intact      Cueing Cueing Techniques: Verbal cues, Tactile cues, Visual cues, Gestural cues  Exercises Other Exercises Other Exercises: supine hooklying transverse abdominal bracing 10 x 5 sec  holds Other Exercises: supine transverse abdominal bracing with isometric hip flexion, abduction, and adduction 10 x 5 sec hold each    General Comments General comments (skin integrity, edema, etc.): HR 115-139bpm      Pertinent Vitals/Pain Pain Assessment Pain Assessment: 0-10 Pain Score: 9  Faces Pain Scale: Hurts even more Pain Location: Bil hips and Back with sitting and standing Pain Descriptors / Indicators: Aching, Discomfort, Sore, Grimacing, Guarding, Moaning, Spasm, Shooting Pain Intervention(s): Limited activity within patient's tolerance, Monitored during session, Premedicated before session, Repositioned     PT Goals (current goals can now be found in the care plan section) Acute Rehab PT Goals Patient Stated Goal: Return Home PT Goal Formulation: With patient/family Time For Goal Achievement: 07/01/23 Potential to Achieve Goals: Good Progress towards PT goals: Progressing toward goals    Frequency    Min 3X/week       AM-PAC PT "6 Clicks" Mobility   Outcome Measure  Help needed turning from your back to your side while in a flat bed without using bedrails?: A Little Help needed moving from lying on your back to sitting on the side of a flat bed without using bedrails?: A Lot Help needed moving to and from a bed to a chair (including a wheelchair)?: Total Help needed standing up from a chair using your arms (e.g., wheelchair or bedside chair)?: A Lot Help needed to walk in hospital room?: Total Help needed climbing 3-5 steps with a railing? : Total 6 Click Score: 10    End of Session Equipment Utilized During Treatment: Gait belt Activity Tolerance: Patient limited by pain Patient left: in bed;with call bell/phone within reach;with bed alarm set;with family/visitor present Nurse Communication: Mobility status;Patient requests pain meds PT Visit Diagnosis: Unsteadiness on feet (R26.81);Other abnormalities of gait and mobility (R26.89);Muscle weakness  (generalized) (M62.81);Difficulty in walking, not elsewhere classified (R26.2);Pain Pain - Right/Left:  (back) Pain - part of body:  (back)     Time: 0102-7253 PT Time Calculation (min) (ACUTE ONLY): 44 min  Charges:    $Therapeutic Exercise: 38-52 mins PT General Charges $$ ACUTE PT VISIT: 1 Visit                     Vickki Muff, PT, DPT   Acute Rehabilitation Department Office [phone] Secure Chat Communication Preferred   Ronnie Derby 06/24/2023, 3:08 PM

## 2023-06-24 NOTE — Plan of Care (Signed)

## 2023-06-25 DIAGNOSIS — M4620 Osteomyelitis of vertebra, site unspecified: Secondary | ICD-10-CM | POA: Diagnosis not present

## 2023-06-25 LAB — CBC WITH DIFFERENTIAL/PLATELET
Abs Immature Granulocytes: 0.04 10*3/uL (ref 0.00–0.07)
Basophils Absolute: 0 10*3/uL (ref 0.0–0.1)
Basophils Relative: 0 %
Eosinophils Absolute: 0.2 10*3/uL (ref 0.0–0.5)
Eosinophils Relative: 3 %
HCT: 28.7 % — ABNORMAL LOW (ref 39.0–52.0)
Hemoglobin: 9.1 g/dL — ABNORMAL LOW (ref 13.0–17.0)
Immature Granulocytes: 1 %
Lymphocytes Relative: 17 %
Lymphs Abs: 1.4 10*3/uL (ref 0.7–4.0)
MCH: 26.7 pg (ref 26.0–34.0)
MCHC: 31.7 g/dL (ref 30.0–36.0)
MCV: 84.2 fL (ref 80.0–100.0)
Monocytes Absolute: 0.6 10*3/uL (ref 0.1–1.0)
Monocytes Relative: 7 %
Neutro Abs: 5.9 10*3/uL (ref 1.7–7.7)
Neutrophils Relative %: 72 %
Platelets: 278 10*3/uL (ref 150–400)
RBC: 3.41 MIL/uL — ABNORMAL LOW (ref 4.22–5.81)
RDW: 16 % — ABNORMAL HIGH (ref 11.5–15.5)
WBC: 8.3 10*3/uL (ref 4.0–10.5)
nRBC: 0 % (ref 0.0–0.2)

## 2023-06-25 LAB — GLUCOSE, CAPILLARY
Glucose-Capillary: 144 mg/dL — ABNORMAL HIGH (ref 70–99)
Glucose-Capillary: 173 mg/dL — ABNORMAL HIGH (ref 70–99)
Glucose-Capillary: 146 mg/dL — ABNORMAL HIGH (ref 70–99)
Glucose-Capillary: 159 mg/dL — ABNORMAL HIGH (ref 70–99)

## 2023-06-25 LAB — BASIC METABOLIC PANEL
Anion gap: 7 (ref 5–15)
BUN: 15 mg/dL (ref 8–23)
CO2: 23 mmol/L (ref 22–32)
Calcium: 8.2 mg/dL — ABNORMAL LOW (ref 8.9–10.3)
Chloride: 106 mmol/L (ref 98–111)
Creatinine, Ser: 1.1 mg/dL (ref 0.61–1.24)
GFR, Estimated: >60 mL/min (ref >60)
Glucose, Bld: 168 mg/dL — ABNORMAL HIGH (ref 70–99)
Potassium: 4 mmol/L (ref 3.5–5.1)
Sodium: 136 mmol/L (ref 135–145)

## 2023-06-25 NOTE — Progress Notes (Signed)
 Inpatient Rehab Admissions Coordinator:   I met with pt. To discuss potential CIR admit. He states he feels that he cannot handle 3 hours of daily therapy and feels that overdoing it in a PT session is why he's struggling to participate since, as pain has increased significantly over the weekend. He would like to do a slower paced rehab at [facility]. I will not pursue CIR admit for this pt.   Megan Salon, MS, CCC-SLP Rehab Admissions Coordinator  [phone] (celll) [phone] (office)

## 2023-06-25 NOTE — Progress Notes (Signed)
 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  He was recently admitted from 2/23 to 2/25 due to cholelithiasis without acute cholecystitis which was treated symptomatically.  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  He underwent pacemaker extraction on 3/13.  Plan for PICC line post-extraction for continued IV abx for 6 weeks followed by PO Cefadroxil x 2 weeks.  Needs outpatient dental follow up.   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 Case was discussed with neurosurgery who recommended MRI Patient has a pacemaker so he was transferred to [facility]; however, unable to get MRI due to type of pacemaker and lead position Likely source is dental infection ID recommended echo -> TEE which showed a MV vegetation Repeat blood cultures negative x 5 days Plan is for Ceftriaxone x 6 weeks (EOT 4/19) and then Cefadroxil x 2 weeks ID f/u on 4/3 F/u with CT surgery after completion of antibiotics and dental extraction(s) He reports that intractable pain is his primary issue, trying Toradol x 3 days (this is day 2 with some improvement) Epidural steroid injections would be contraindicated in the setting of active infection and there are no other obvious interventions at this time   Poor mobility Patient was able to ambulate with PT/OT Friday and did well However, after return to bed he was unable to get up again and so did not discharge  to home with [facility] as planned PT continues to assess and he struggles to push up to sit or bring his legs off the bed due to extreme pain, can walk once out of bed CIR consulted but he is unlikely to be able to participate in 3 hours of therapy daily and so may need SNF rehab Pain control at this time is with oxy, Dilaudid, Lidoderm, and Robaxin as well as Toradol We are now looking to arrange for SNF rehab in [facility], preferably near [place]   Pacemaker Removed on 3/14 Will need outpatient cardiology and EP follow up, cardiology will arrange Continue ASA Mild persistent tachycardia - > related to pain   Poor dentition Needs dental extraction, hopefully prior to completion of antibiotics to reduce future infection risk   Normocytic anemia No evidence of overt bleeding Appears to be generally stable but will need to be followed as outpatient   Essential hypertension Continue amlodipine and irbesartan Continue to hold HCTZ BP 112/77, good control   BPH Continue alfuzosin, Detrol   Hyperlipidemia Continue rosuvastatin   Recently diagnosed cholelithiasis Outpatient follow-up with general surgery.   Diabetes mellitus type 2 with hyperglycemia HbA1c is 6.2, good control Resume metformin at time of dc Continue SSI in the hospital             Consultants: ID Cardiology/EP CT surgery OT PT TOC team   Procedures: Echocardiogram 3/9 TEE 3/10 Lead extraction, pacemaker removal 3/13   Antibiotics: Cefazolin x 2 doses Zosyn x 1 dose Vancomycin x 1 dose Ceftriaxone 3/7-  30 Day Unplanned Readmission Risk Score  Flowsheet Row ED to Hosp-Admission (Current) from 06/14/2023 in [facility]  30 Day Unplanned Readmission Risk Score (%) 20.37 Filed at 06/25/2023 0801       This score is the patient's risk of an unplanned readmission within 30 days of being discharged (0 -100%). The score is based on dignosis, age, lab data, medications, orders, and  past utilization.   Low:  0-14.9   Medium: 15-21.9   High: 22-29.9   Extreme: 30 and above           Subjective: Persistent back pain limiting his mobility.  No new concerns.   Objective: Vitals:   06/25/23 0808 06/25/23 1434  BP: (!) 144/86 (!) 116/59  Pulse: 97 100  Resp: 18 18  Temp: 98 F (36.7 C) 98.2 F (36.8 C)  SpO2: 98% 97%    Intake/Output Summary (Last 24 hours) at 06/25/2023 1522 Last data filed at 06/25/2023 1435 Gross per 24 hour  Intake 601 ml  Output 750 ml  Net -149 ml   Filed Weights   06/14/23 1645  Weight: 80.4 kg    Exam:  General:  Appears calm and comfortable and is in NAD while at rest, winces with movement Eyes:  EOMI, normal lids, iris ENT:  grossly normal hearing, lips & tongue, mmm; suboptimal dentition Neck:  no LAD, masses or thyromegaly Cardiovascular:  RRR, no m/r/g. No LE edema.  Respiratory:   CTA bilaterally with no wheezes/rales/rhonchi.  Normal respiratory effort. Abdomen:  soft, NT, ND Skin:  no rash or induration seen on limited exam Musculoskeletal:  grossly normal tone BUE/BLE, good ROM, no bony abnormality Psychiatric:  grossly normal mood and affect, speech fluent and appropriate, AOx3 Neurologic:  CN 2-12 grossly intact, symmetric BLE weakness due to back pain, normal BUE strength  Data Reviewed: I have reviewed the patient's lab results since admission.  Pertinent labs for today include:   Glucose 168 WBC 8.3 Hgb 9.1     Family Communication: None present  Disposition: Status is: Inpatient Remains inpatient appropriate because: awaiting placement     Time spent: 35 minutes  Unresulted Labs (From admission, onward)    None        Author: Jonah Blue, MD 06/25/2023 3:22 PM  For on call review [email or web address].

## 2023-06-25 NOTE — Progress Notes (Signed)
 Occupational Therapy Treatment Patient Details Name: Grant Bowers MRN: [id number] DOB: 11-22-1945 Today's Date: 06/25/2023   History of present illness Pt is 78 yo presenting to [facility] ED on 3/6 due to increased pain in LB with concerns for discitis. 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 TEE 3/10. PMH: DM II, HTN, mixed hyperlipidemia, LBP, trifascicular block s/p PPM, OSA on CPAP, GERD.   OT comments  Pt making good progress towards OT goals. Emphasis on techniques to improve overall safety and mobility. Pt able to manage bed mobility w/ Supervision and hallway mobility using RW with CGA. Pt remains pain limited but was premedicated for OT session. Pt's wife present to observe. Educated both re: fall prevention, ADL modifications, DME needs and managing good vs bad days. Both still interested in postacute rehab though pt hopeful to progress home at DC w/ increased mobility opportunities. Will continue to follow acutely.      If plan is discharge home, recommend the following:  A lot of help with bathing/dressing/bathroom;Assistance with cooking/housework;Assist for transportation;Help with stairs or ramp for entrance;A little help with walking and/or transfers   Equipment Recommendations  BSC/3in1;Wheelchair (measurements OT);Wheelchair cushion (measurements OT);Other (comment);Hospital bed (RW)    Recommendations for Other Services      Precautions / Restrictions Precautions Precautions: Fall Recall of Precautions/Restrictions: Intact Restrictions Weight Bearing Restrictions Per Provider Order: No       Mobility Bed Mobility Overal bed mobility: Needs Assistance Bed Mobility: Rolling, Sidelying to Sit Rolling: Supervision Sidelying to sit: Supervision       General bed mobility comments: use of bedrails, cues on techique and sequencing.significant effort on pt's part but able to manage    Transfers Overall transfer level: Needs assistance Equipment used: Rolling walker  (2 wheels) Transfers: Sit to/from Stand Sit to Stand: Contact guard assist, Via lift equipment           General transfer comment: elevated bed to simulate home environment. cues for hand placement but pt pulled on RW. able to stand with effort but no external assist needed     Balance Overall balance assessment: Needs assistance Sitting-balance support: Bilateral upper extremity supported, Feet supported Sitting balance-Leahy Scale: Poor Sitting balance - Comments: reliant on BUE EOB due to pain   Standing balance support: Bilateral upper extremity supported, During functional activity, Reliant on assistive device for balance Standing balance-Leahy Scale: Poor                             ADL either performed or assessed with clinical judgement   ADL Overall ADL's : Needs assistance/impaired                                       General ADL Comments: Emphasis on mobility strategies, needed DME for every level of physical assistance considering good vs bad days, fall prevention and ADL modifications. Pt reports able to dress self (UB and LB ) with Setup bed level on day of prior potential DC - encouraged this if pt continues to be heavily reliant on BUE support in standing. Provided gait belt to wife - provided initial education on when to use gait belt. Discussed rehab vs home w/ wife confirming ability to assist but both still weighing options.    Extremity/Trunk Assessment Upper Extremity Assessment Upper Extremity Assessment: Generalized weakness;Right hand dominant   Lower Extremity Assessment  Lower Extremity Assessment: Defer to PT evaluation        Vision   Vision Assessment?: No apparent visual deficits   Perception     Praxis     Communication Communication Communication: No apparent difficulties   Cognition Arousal: Alert Behavior During Therapy: WFL for tasks assessed/performed Cognition: No apparent impairments              OT - Cognition Comments: WFL, does benefit from cues for safety and problem solving strategies but likely distracted by pain levels                 Following commands: Intact        Cueing   Cueing Techniques: Verbal cues, Gestural cues  Exercises      Shoulder Instructions       General Comments Wife at bedside    Pertinent Vitals/ Pain       Pain Assessment Pain Assessment: Faces Faces Pain Scale: Hurts little more Pain Location: Bil hips and Back with sitting and standing Pain Descriptors / Indicators: Discomfort, Sore, Grimacing, Guarding, Spasm, Shooting Pain Intervention(s): Monitored during session, Limited activity within patient's tolerance, Premedicated before session, RN gave pain meds during session  Home Living                                          Prior Functioning/Environment              Frequency  Min 1X/week        Progress Toward Goals  OT Goals(current goals can now be found in the care plan section)  Progress towards OT goals: Progressing toward goals  Acute Rehab OT Goals Patient Stated Goal: work on mobilizing more while in hospital, pain control OT Goal Formulation: With patient Time For Goal Achievement: 07/01/23 Potential to Achieve Goals: Good ADL Goals Pt Will Perform Upper Body Dressing: with modified independence Pt Will Perform Lower Body Dressing: with modified independence Pt Will Transfer to Toilet: with modified independence Pt Will Perform Toileting - Clothing Manipulation and hygiene: with modified independence  Plan      Co-evaluation                 AM-PAC OT "6 Clicks" Daily Activity     Outcome Measure   Help from another person eating meals?: None Help from another person taking care of personal grooming?: A Little Help from another person toileting, which includes using toliet, bedpan, or urinal?: A Lot Help from another person bathing (including washing, rinsing,  drying)?: A Lot Help from another person to put on and taking off regular upper body clothing?: A Little Help from another person to put on and taking off regular lower body clothing?: A Lot 6 Click Score: 16    End of Session Equipment Utilized During Treatment: Gait belt;Rolling walker (2 wheels)  OT Visit Diagnosis: Unsteadiness on feet (R26.81);Other abnormalities of gait and mobility (R26.89);Muscle weakness (generalized) (M62.81);Pain Pain - part of body: Hip   Activity Tolerance Patient tolerated treatment well;Patient limited by pain   Patient Left in bed;with call bell/phone within reach;with bed alarm set;with family/visitor present   Nurse Communication Mobility status        Time: 1610-9604 OT Time Calculation (min): 42 min  Charges: OT General Charges $OT Visit: 1 Visit OT Treatments $Self Care/Home Management : 8-22 mins $Therapeutic Activity: 23-37 mins  Raynelle Fanning  B, OTR/L Acute Rehab Services Office: [phone]   Lorre Munroe 06/25/2023, 12:55 PM

## 2023-06-25 NOTE — Plan of Care (Signed)

## 2023-06-26 ENCOUNTER — Other Ambulatory Visit (HOSPITAL_COMMUNITY): Payer: Self-pay

## 2023-06-26 DIAGNOSIS — M4620 Osteomyelitis of vertebra, site unspecified: Secondary | ICD-10-CM | POA: Diagnosis not present

## 2023-06-26 LAB — GLUCOSE, CAPILLARY
Glucose-Capillary: 139 mg/dL — ABNORMAL HIGH (ref 70–99)
Glucose-Capillary: 145 mg/dL — ABNORMAL HIGH (ref 70–99)
Glucose-Capillary: 194 mg/dL — ABNORMAL HIGH (ref 70–99)
Glucose-Capillary: 148 mg/dL — ABNORMAL HIGH (ref 70–99)

## 2023-06-26 NOTE — TOC Progression Note (Signed)
 Transition of Care [facility]) - Progression Note    Patient Details  Name: Grant Bowers MRN: [id number] Date of Birth: 08/02/1945  Transition of Care [facility] CM/SW Contact  Michaela Corner, [place] Phone Number: 06/26/2023, 12:02 PM  Clinical Narrative:   CSW provided medicare.gov ratings for accepting bed offers to patient and his wife at bedside. Wife states she would like to reach out to accepting facilities before making a decision. Patient asked CSW to follow up on bed choice. CSW explained once they choose a bed offer, will need to submit for insurance for authorization. Patient asked how long does he stay at SNF. CSW informed patient that insurance typically pays for approximately 20 days, if more time is needed then the facility will continue to resubmit.   Plan  F/u on bed choice, then submit for auth.   TOC will continue to follow.   Expected Discharge Plan: Skilled Nursing Facility Barriers to Discharge: Continued Medical Work up, Other (must enter comment), English as a second language teacher (Decision on SNF pending)  Expected Discharge Plan and Services In-house Referral: Clinical Social Work Discharge Planning Services: CM Consult Post Acute Care Choice: Home Health Living arrangements for the past 2 months: Single Family Home Expected Discharge Date: 06/22/23               DME Arranged: IV pump/equipment DME Agency:  Julianne Rice) Date DME Agency Contacted: 06/22/23 Time DME Agency Contacted: 1030 Representative spoke with at DME Agency: Pam HH Arranged: RN, Disease Management, PT HH Agency: [facility] Date [facility] Agency Contacted: 06/22/23 Time HH Agency Contacted: 1015 Representative spoke with at [facility] Agency: Lorie   Social Determinants of Health (SDOH) Interventions SDOH Screenings   Food Insecurity: No Food Insecurity (06/15/2023)  Housing: Low Risk  (06/15/2023)  Transportation Needs: No Transportation Needs (06/15/2023)  Utilities: Not At Risk (06/15/2023)   Depression (PHQ2-9): Low Risk  (04/17/2023)  Social Connections: Moderately Isolated (06/15/2023)  Tobacco Use: Low Risk  (06/21/2023)    Readmission Risk Interventions    06/15/2023    1:42 PM  Readmission Risk Prevention Plan  Transportation Screening Complete  HRI or Home Care Consult Complete  Social Work Consult for Recovery Care Planning/Counseling Complete  Palliative Care Screening Not Applicable  Medication Review Oceanographer) Complete

## 2023-06-26 NOTE — Progress Notes (Signed)
 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  He was recently admitted from 2/23 to 2/25 due to cholelithiasis without acute cholecystitis which was treated symptomatically.  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  He underwent pacemaker extraction on 3/13.  Plan for PICC line post-extraction for continued IV abx for 6 weeks followed by PO Cefadroxil x 2 weeks.  Needs outpatient dental follow up.   Assessment and Plan:   *Patient was discharged on 3/14 with [facility] after a successful therapy session. However, after discharge was written he felt too unsafe to go home and requested SNF rehab.  He has been medically stable and awaiting SNF placement since.   Acute back pain with vertebral osteomyelitis/Streptococcus mitis/oralis bacteremia/MV endocarditis Acute on chronic back pain on presentation CT A/P osteomyelitis of the L4 vertebral body. Case was discussed with neurosurgery who recommended MRI Patient has a pacemaker so he was transferred to [facility]; however, unable to get MRI due to type of pacemaker and lead position Likely source is dental infection ID recommended echo -> TEE which showed a MV vegetation Repeat blood cultures negative x 5 days Plan is for Ceftriaxone x 6 weeks (EOT 4/19) and then Cefadroxil x 2 weeks ID f/u on 4/3 F/u with CT surgery after completion of antibiotics and dental extraction(s) He reports that intractable pain is his primary issue, which seems to be improving with mobility also improving Epidural steroid injections would be contraindicated in the setting of  active infection and there are no other obvious interventions at this time   Poor mobility Patient was able to ambulate with PT/OT Friday and did well However, after return to bed he was unable to get up again and so did not discharge to home with [facility] as planned PT continues to assess and he struggles to push up to sit or bring his legs off the bed due to extreme pain, can walk once out of bed CIR consulted but he is unlikely to be able to participate in 3 hours of therapy daily and so may need SNF rehab Pain control at this time is with oxy, Dilaudid, Lidoderm, and Robaxin as well as Toradol We are now looking to arrange for SNF rehab in [facility], preferably near [place]   Pacemaker Removed on 3/14 Will need outpatient cardiology and EP follow up, cardiology will arrange Continue ASA Mild persistent tachycardia - > related to pain, improved today   Poor dentition Needs dental extraction, hopefully prior to completion of antibiotics to reduce future infection risk   Normocytic anemia No evidence of overt bleeding Appears to be generally stable but will need to be followed as outpatient   Essential hypertension Continue amlodipine and irbesartan Continue to hold HCTZ BP 112/77, good control   BPH Continue alfuzosin, Detrol   Hyperlipidemia Continue rosuvastatin   Recently diagnosed cholelithiasis Outpatient follow-up with general surgery.   Diabetes mellitus type 2 with hyperglycemia HbA1c is 6.2, good control Resume metformin at time of dc Continue SSI in the hospital             Consultants: ID Cardiology/EP CT surgery OT  PT TOC team   Procedures: Echocardiogram 3/9 TEE 3/10 Lead extraction, pacemaker removal 3/13   Antibiotics: Cefazolin x 2 doses Zosyn x 1 dose Vancomycin x 1 dose Ceftriaxone 3/7-   30 Day Unplanned Readmission Risk Score    Flowsheet Row ED to Hosp-Admission (Current) from 06/14/2023 in [place] [facility]  30 Day  Unplanned Readmission Risk Score (%) 20.65 Filed at 06/26/2023 0801       This score is the patient's risk of an unplanned readmission within 30 days of being discharged (0 -100%). The score is based on dignosis, age, lab data, medications, orders, and past utilization.   Low:  0-14.9   Medium: 15-21.9   High: 22-29.9   Extreme: 30 and above           Subjective: Still not sure if he will go to SNF rehab, trying to get better faster.     Objective: Vitals:   06/26/23 0749 06/26/23 1152  BP: (!) 157/87 (!) 145/63  Pulse: 93 70  Resp: 18 18  Temp: 98.6 F (37 C) 98.6 F (37 C)  SpO2: 98% 98%    Intake/Output Summary (Last 24 hours) at 06/26/2023 1705 Last data filed at 06/26/2023 1154 Gross per 24 hour  Intake 747 ml  Output 1075 ml  Net -328 ml   Filed Weights   06/14/23 1645  Weight: 80.4 kg    Exam:  General:  Appears calm and comfortable and is in NAD while at rest, doing leg exercises while in bed Eyes:  EOMI, normal lids, iris ENT:  grossly normal hearing, lips & tongue, mmm; suboptimal dentition Neck:  no LAD, masses or thyromegaly Cardiovascular:  RRR, no m/r/g. No LE edema.  Respiratory:   CTA bilaterally with no wheezes/rales/rhonchi.  Normal respiratory effort. Abdomen:  soft, NT, ND Skin:  no rash or induration seen on limited exam Musculoskeletal:  grossly normal tone BUE/BLE, good ROM, no bony abnormality Psychiatric:  grossly normal mood and affect, speech fluent and appropriate, AOx3 Neurologic:  CN 2-12 grossly intact, symmetric BLE weakness due to back pain, normal BUE strength  Data Reviewed: I have reviewed the patient's lab results since admission.  Pertinent labs for today include:   None today     Family Communication: None present  Disposition: Status is: Inpatient Remains inpatient appropriate because: awaiting placement     Time spent: 35 minutes  Unresulted Labs (From admission, onward)     Start     Ordered   06/27/23  0500  CBC with Differential/Platelet  Tomorrow morning,   R       Question:  Specimen collection method  Answer:  Lab=Lab collect   06/26/23 1705   06/27/23 0500  Basic metabolic panel  Tomorrow morning,   R       Question:  Specimen collection method  Answer:  Lab=Lab collect   06/26/23 1705             Author: Jonah Blue, MD 06/26/2023 5:05 PM  For on call review [email or web address].

## 2023-06-26 NOTE — Plan of Care (Signed)

## 2023-06-26 NOTE — Progress Notes (Signed)
 Physical Therapy Treatment Patient Details Name: Grant Bowers MRN: [id number] DOB: [birth year] Today's Date: 06/26/2023   History of Present Illness Pt is 78 yo presenting to [facility] ED on 3/6 due to increased pain in LB with concerns for discitis. 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 TEE 3/10. PPM removed 3/14. PMH: DM II, HTN, mixed hyperlipidemia, LBP, trifascicular block s/p PPM, OSA on CPAP, GERD.    PT Comments  Pt greeted supine in bed, wife present in room, pleasant and agreeable to PT session. He was premedicated before session and pain appeared better managed throughout treatment. Pt performed log roll technique for supine>sit twice with supervision and required CGA to bring BLE back into bed for sit>supine d/t RLE pain. He was able to advance OOB mobility by engaging in two bouts of ambulation one in the hallway and one to the bathroom using RW with CGA and a rest break in between. Educated pt on two exercises he could perform in bed to assist in alleviating back pain. Will continue to follow acutely and advance appropriately in order to address current deficits and maximize independence with functional mobility and safety prior to d/c.      If plan is discharge home, recommend the following: Help with stairs or ramp for entrance;Assistance with cooking/housework;Assist for transportation;A little help with walking and/or transfers;A little help with bathing/dressing/bathroom   Can travel by private vehicle     Yes  Equipment Recommendations  Rolling walker (2 wheels)    Recommendations for Other Services       Precautions / Restrictions Precautions Precautions: Fall Recall of Precautions/Restrictions: Intact Restrictions Weight Bearing Restrictions Per Provider Order: No     Mobility  Bed Mobility Overal bed mobility: Needs Assistance Bed Mobility: Rolling, Sidelying to Sit, Sit to Supine Rolling: Used rails, Modified independent (Device/Increase time) Sidelying to  sit: Used rails, Supervision   Sit to supine: Contact guard assist   General bed mobility comments: Pt demonstrated proper log roll mechanics. He sat up on R side of bed twice once using the rail and once without. He takes increased time to reach upright as he shifts UE support to aid in achieve erect position. Pt scooted fwd til feet support with supervision. Returning to supine pt had increased pain in RLE, required CGA to bring BLE back into bed. He repositioned self in bed.    Transfers Overall transfer level: Needs assistance Equipment used: Rolling walker (2 wheels) Transfers: Sit to/from Stand Sit to Stand: Contact guard assist           General transfer comment: Cued pt on proper hand placement and sequencing with RW. He has a tendency to push up with BUE on RW. Pt stood from lowest bed height with CGA. Good eccentric control with sitting.    Ambulation/Gait Ambulation/Gait assistance: Contact guard assist Gait Distance (Feet): 150 Feet (1x150, returned to bed, 1x15 to commode) Assistive device: Rolling walker (2 wheels) Gait Pattern/deviations: Step-through pattern, Decreased stride length, Narrow base of support Gait velocity: WFL Gait velocity interpretation: 1.31 - 2.62 ft/sec, indicative of limited community ambulator   General Gait Details: Pt ambulated with reciprocal gait pattern, good foot clearence, equal weight shift, and upright posture. Pt maintained a faster pace, likely to complete the activity quicker with less pain onset. Cued pt to slow to a more smooth and steady pace with feet shoulder width apart to improve safety and stability as he keeps a NBOS with RLE closer to midline than LLE. Pt was unsteady  during turning, noteable postural sway, and difficulty sequencing steps. VC/TC to improve technique with carryover demonstrated when pt went to the bathroom later.   Stairs             Wheelchair Mobility     Tilt Bed    Modified Rankin (Stroke  Patients Only)       Balance Overall balance assessment: Needs assistance Sitting-balance support: Feet supported Sitting balance-Leahy Scale: Fair Sitting balance - Comments: Pt sat EOB with supervision and varying UE support.   Standing balance support: Bilateral upper extremity supported, During functional activity, Reliant on assistive device for balance Standing balance-Leahy Scale: Poor Standing balance comment: Pt dependent on RW for OOB mobility. He required assistance to pull down his pants at the commode, unable to release UE support from RW.                            Communication Communication Communication: No apparent difficulties  Cognition Arousal: Alert Behavior During Therapy: WFL for tasks assessed/performed   PT - Cognitive impairments: No apparent impairments                         Following commands: Intact      Cueing Cueing Techniques: Verbal cues, Gestural cues  Exercises Other Exercises Other Exercises: Supine hooklying knee rocks R/L with hold at stretch point for ~30 sec x5 ea direction. Other Exercises: Supine hooklying figure four stretch with pt pushing knee down towards EOB hold for ~30sec x3 ea side.    General Comments General comments (skin integrity, edema, etc.): Wife present throughout session and supportive. Discussed d/c planning and the impact pain has had on pt's ability to participate. Recommend pt to complete OOB<>chair/bathroom transfers throughout the day and walk with RN/mobility. Reviewed HEP handout with pt/wife.      Pertinent Vitals/Pain Pain Assessment Pain Assessment: 0-10 Faces Pain Scale: Hurts little more Pain Location: Back and R hip Pain Descriptors / Indicators: Discomfort, Sore, Spasm, Shooting, Aching Pain Intervention(s): Monitored during session, Repositioned    Home Living                          Prior Function            PT Goals (current goals can now be found in  the care plan section) Acute Rehab PT Goals Patient Stated Goal: Get stronger Progress towards PT goals: Progressing toward goals    Frequency    Min 3X/week      PT Plan      Co-evaluation              AM-PAC PT "6 Clicks" Mobility   Outcome Measure  Help needed turning from your back to your side while in a flat bed without using bedrails?: A Little Help needed moving from lying on your back to sitting on the side of a flat bed without using bedrails?: A Little Help needed moving to and from a bed to a chair (including a wheelchair)?: A Little Help needed standing up from a chair using your arms (e.g., wheelchair or bedside chair)?: A Little Help needed to walk in hospital room?: A Little Help needed climbing 3-5 steps with a railing? : A Lot 6 Click Score: 17    End of Session Equipment Utilized During Treatment: Gait belt Activity Tolerance: Patient tolerated treatment well;Patient limited by pain Patient left: with  family/visitor present;Other (comment) (pt on commode, RN aware and okay with positioning.) Nurse Communication: Mobility status;Other (comment) (Pt positioning at end of session.) PT Visit Diagnosis: Unsteadiness on feet (R26.81);Other abnormalities of gait and mobility (R26.89);Muscle weakness (generalized) (M62.81);Difficulty in walking, not elsewhere classified (R26.2);Pain Pain - Right/Left: Right Pain - part of body: Hip (Back)     Time: 5784-6962 PT Time Calculation (min) (ACUTE ONLY): 35 min  Charges:    $Gait Training: 8-22 mins $Therapeutic Activity: 8-22 mins PT General Charges $$ ACUTE PT VISIT: 1 Visit                     Cheri Guppy, PT, DPT Acute Rehabilitation Services Office: [phone] Secure Chat Preferred  Richardson Chiquito 06/26/2023, 5:15 PM

## 2023-06-26 NOTE — Progress Notes (Addendum)
 Occupational Therapy Treatment Patient Details Name: Grant Bowers MRN: [id number] DOB: [birth year] Today's Date: 06/26/2023   History of present illness Pt is 78 yo presenting to [facility] on 3/6 due to increased pain in LB with concerns for discitis. 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 TEE 3/10. PPM removed 3/14. PMH: DM II, HTN, mixed hyperlipidemia, LBP, trifascicular block s/p PPM, OSA on CPAP, GERD.   OT comments  Despite pain premedication, pt limited in abilities today due to continued R hip/low back pain. Pt able to manage bed mobility and  brief standing using RW with CGA but unable to sustain to engage in functional tasks or ambulation d/t progressive pain. Wife at bedside and supportive. Educated re: repositioning in bed to minimize pain, bedrail attachments vs hospital bed, LB ADL strategies bed level and encouraged working on tolerance to [facility] elevation and upright position in chair/EOB. DC recs remain appropriate.       If plan is discharge home, recommend the following:  A lot of help with bathing/dressing/bathroom;Assistance with cooking/housework;Assist for transportation;Help with stairs or ramp for entrance;A little help with walking and/or transfers   Equipment Recommendations  Hospital bed;Wheelchair cushion (measurements OT) (wife reports having BSC, RW and wheelchair at home. need to confirm if they have a wheelchair cushion)    Recommendations for Other Services      Precautions / Restrictions Precautions Precautions: Fall Recall of Precautions/Restrictions: Intact Restrictions Weight Bearing Restrictions Per Provider Order: No       Mobility Bed Mobility Overal bed mobility: Needs Assistance Bed Mobility: Rolling, Sidelying to Sit, Sit to Sidelying Rolling: Modified independent (Device/Increase time) Sidelying to sit: Supervision, Used rails     Sit to sidelying: Modified independent (Device/Increase time) General bed mobility comments: increased time,  pain limited and use of bedrails but able to manage without physical assistance.    Transfers Overall transfer level: Needs assistance Equipment used: Rolling walker (2 wheels) Transfers: Sit to/from Stand Sit to Stand: Contact guard assist, From elevated surface           General transfer comment: pt with poor tolerance to EOB today, prompted to attempt standing to assess pain changes. pt able to stand from elevated bed to simulate home with CGA. pt with tendency to put BUE on RW to stand     Balance Overall balance assessment: Needs assistance Sitting-balance support: Bilateral upper extremity supported, Feet supported Sitting balance-Leahy Scale: Poor Sitting balance - Comments: reliant on BUE EOB due to pain   Standing balance support: Bilateral upper extremity supported, During functional activity, Reliant on assistive device for balance Standing balance-Leahy Scale: Poor                             ADL either performed or assessed with clinical judgement   ADL Overall ADL's : Needs assistance/impaired                       Lower Body Dressing Details (indicate cue type and reason): attempted crossing LE EOB to reach socks with increased effort and pain noted. Further discussed pt's strategies to dress bed level if pain limiting reach.               General ADL Comments: Educated on positioning in bed with pillows sidelying and supine to minimize pain, bedrail attachments that could be purchased for home use with pt's spouse planning to research. Discussed home vs rehab again w/  spouse planning to read online and visit the 2 facilities tomorrow to make a DC decision.    Extremity/Trunk Assessment Upper Extremity Assessment Upper Extremity Assessment: Overall WFL for tasks assessed;Right hand dominant   Lower Extremity Assessment Lower Extremity Assessment: Defer to PT evaluation        Vision   Vision Assessment?: No apparent visual  deficits   Perception     Praxis     Communication Communication Communication: No apparent difficulties   Cognition Arousal: Alert Behavior During Therapy: WFL for tasks assessed/performed Cognition: No apparent impairments             OT - Cognition Comments: WFL, does benefit from cues for safety and problem solving strategies but likely distracted by pain levels                 Following commands: Intact        Cueing   Cueing Techniques: Verbal cues, Gestural cues  Exercises      Shoulder Instructions       General Comments Wife at bedside and supportive    Pertinent Vitals/ Pain       Pain Assessment Pain Assessment: Faces Faces Pain Scale: Hurts whole lot Pain Location: Bil hips and Back with sitting and standing Pain Descriptors / Indicators: Discomfort, Sore, Grimacing, Guarding, Spasm, Shooting Pain Intervention(s): Monitored during session, Limited activity within patient's tolerance, Premedicated before session, Repositioned  Home Living                                          Prior Functioning/Environment              Frequency  Min 1X/week        Progress Toward Goals  OT Goals(current goals can now be found in the care plan section)  Progress towards OT goals: Progressing toward goals  Acute Rehab OT Goals Patient Stated Goal: pain control OT Goal Formulation: With patient Time For Goal Achievement: 07/01/23 Potential to Achieve Goals: Good ADL Goals Pt Will Perform Upper Body Dressing: with modified independence Pt Will Perform Lower Body Dressing: with modified independence Pt Will Transfer to Toilet: with modified independence Pt Will Perform Toileting - Clothing Manipulation and hygiene: with modified independence  Plan      Co-evaluation                 AM-PAC OT "6 Clicks" Daily Activity     Outcome Measure   Help from another person eating meals?: None Help from another person  taking care of personal grooming?: A Little Help from another person toileting, which includes using toliet, bedpan, or urinal?: A Lot Help from another person bathing (including washing, rinsing, drying)?: A Lot Help from another person to put on and taking off regular upper body clothing?: A Little Help from another person to put on and taking off regular lower body clothing?: A Lot 6 Click Score: 16    End of Session Equipment Utilized During Treatment: Rolling walker (2 wheels)  OT Visit Diagnosis: Unsteadiness on feet (R26.81);Other abnormalities of gait and mobility (R26.89);Muscle weakness (generalized) (M62.81);Pain Pain - part of body: Hip   Activity Tolerance Patient limited by pain   Patient Left in bed;with call bell/phone within reach;with family/visitor present   Nurse Communication Mobility status        Time: 1610-9604 OT Time Calculation (min): 31 min  Charges: OT General Charges $OT Visit: 1 Visit OT Treatments $Self Care/Home Management : 8-22 mins $Therapeutic Activity: 8-22 mins  Bradd Canary, OTR/L Acute Rehab Services Office: [phone]   Lorre Munroe 06/26/2023, 2:14 PM

## 2023-06-26 NOTE — Care Management Important Message (Signed)
 Important Message  Patient Details  Name: Grant Bowers MRN: [id number] Date of Birth: 10/03/1945   Important Message Given:  Yes - Medicare IM     Renie Ora 06/26/2023, 11:40 AM

## 2023-06-27 DIAGNOSIS — M4620 Osteomyelitis of vertebra, site unspecified: Secondary | ICD-10-CM | POA: Diagnosis not present

## 2023-06-27 LAB — BASIC METABOLIC PANEL
Anion gap: 7 (ref 5–15)
BUN: 24 mg/dL — ABNORMAL HIGH (ref 8–23)
CO2: 22 mmol/L (ref 22–32)
Calcium: 8.4 mg/dL — ABNORMAL LOW (ref 8.9–10.3)
Chloride: 106 mmol/L (ref 98–111)
Creatinine, Ser: 1.03 mg/dL (ref 0.61–1.24)
GFR, Estimated: >60 mL/min (ref >60)
Glucose, Bld: 146 mg/dL — ABNORMAL HIGH (ref 70–99)
Potassium: 4 mmol/L (ref 3.5–5.1)
Sodium: 135 mmol/L (ref 135–145)

## 2023-06-27 LAB — CBC WITH DIFFERENTIAL/PLATELET
Abs Immature Granulocytes: 0.02 10*3/uL (ref 0.00–0.07)
Basophils Absolute: 0 10*3/uL (ref 0.0–0.1)
Basophils Relative: 0 %
Eosinophils Absolute: 0.2 10*3/uL (ref 0.0–0.5)
Eosinophils Relative: 3 %
HCT: 26.9 % — ABNORMAL LOW (ref 39.0–52.0)
Hemoglobin: 8.6 g/dL — ABNORMAL LOW (ref 13.0–17.0)
Immature Granulocytes: 0 %
Lymphocytes Relative: 19 %
Lymphs Abs: 1.3 10*3/uL (ref 0.7–4.0)
MCH: 26.9 pg (ref 26.0–34.0)
MCHC: 32 g/dL (ref 30.0–36.0)
MCV: 84.1 fL (ref 80.0–100.0)
Monocytes Absolute: 0.5 10*3/uL (ref 0.1–1.0)
Monocytes Relative: 8 %
Neutro Abs: 5 10*3/uL (ref 1.7–7.7)
Neutrophils Relative %: 70 %
Platelets: 240 10*3/uL (ref 150–400)
RBC: 3.2 MIL/uL — ABNORMAL LOW (ref 4.22–5.81)
RDW: 16.4 % — ABNORMAL HIGH (ref 11.5–15.5)
WBC: 7.1 10*3/uL (ref 4.0–10.5)
nRBC: 0 % (ref 0.0–0.2)

## 2023-06-27 LAB — GLUCOSE, CAPILLARY
Glucose-Capillary: 129 mg/dL — ABNORMAL HIGH (ref 70–99)
Glucose-Capillary: 208 mg/dL — ABNORMAL HIGH (ref 70–99)
Glucose-Capillary: 116 mg/dL — ABNORMAL HIGH (ref 70–99)
Glucose-Capillary: 188 mg/dL — ABNORMAL HIGH (ref 70–99)

## 2023-06-27 NOTE — Progress Notes (Signed)
 Mobility Specialist Progress Note;   06/27/23 1200  Mobility  Activity Refused mobility   RN stating to hold off on mobility at this time d/t pt being limited by pain. Will f/u as schedule permits.  Caesar Bookman Mobility Specialist Please contact via SecureChat or [employer] [phone]

## 2023-06-27 NOTE — TOC Progression Note (Addendum)
 Transition of Care [facility]) - Progression Note    Patient Details  Name: Grant Bowers MRN: [id number] Date of Birth: 29-Dec-1945  Transition of Care [facility]) CM/SW Contact  Michaela Corner, [place] Phone Number: 06/27/2023, 11:10 AM  Clinical Narrative:   CSW spoke with patients wife, Fannie Knee, about choice of facility. CSW informed Fannie Knee and patient that he is medically stable for DC, per MD yesterday. Patient and Fannie Knee have not decided on [facility] v SNF at this time and would like to speak to MD regarding pain management/medications for patient. CSW notified MD and bedside RN.   CSW notified RNCM about patients and wife trying to decide between [facility] vs SNF.   4:09 PM CSW spoke with patient about choice of SNF vs HH. Patient stated he is leaning towards going home but wants to speak with someone about pain management/ medications he will dc on. CSW notified treatment team.   TOC will continue to follow.    Expected Discharge Plan: Skilled Nursing Facility Barriers to Discharge: Continued Medical Work up, Other (must enter comment), English as a second language teacher (Decision on SNF pending)  Expected Discharge Plan and Services In-house Referral: Clinical Social Work Discharge Planning Services: CM Consult Post Acute Care Choice: Home Health Living arrangements for the past 2 months: Single Family Home Expected Discharge Date: 06/22/23               DME Arranged: IV pump/equipment DME Agency:  Julianne Rice) Date DME Agency Contacted: 06/22/23 Time DME Agency Contacted: 1030 Representative spoke with at DME Agency: Pam HH Arranged: RN, Disease Management, PT HH Agency: [facility] Date [facility] Agency Contacted: 06/22/23 Time HH Agency Contacted: 1015 Representative spoke with at [facility] Agency: Lorie   Social Determinants of Health (SDOH) Interventions SDOH Screenings   Food Insecurity: No Food Insecurity (06/15/2023)  Housing: Low Risk  (06/15/2023)  Transportation Needs: No Transportation Needs  (06/15/2023)  Utilities: Not At Risk (06/15/2023)  Depression (PHQ2-9): Low Risk  (04/17/2023)  Social Connections: Moderately Isolated (06/15/2023)  Tobacco Use: Low Risk  (06/21/2023)    Readmission Risk Interventions    06/15/2023    1:42 PM  Readmission Risk Prevention Plan  Transportation Screening Complete  HRI or Home Care Consult Complete  Social Work Consult for Recovery Care Planning/Counseling Complete  Palliative Care Screening Not Applicable  Medication Review Oceanographer) Complete

## 2023-06-27 NOTE — NC FL2 (Signed)
 [place] MEDICAID FL2 LEVEL OF CARE FORM     IDENTIFICATION  Patient Name: Grant Bowers Birthdate: [birth year] Sex: male Admission Date (Current Location): 06/14/2023  [facility] [place] Number:   ([place]   Facility and Address:  [facility], 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   Non-rheumatic mitral regurgitation 06/18/2023   Vertebral osteomyelitis (HCC) 06/15/2023   Hyponatremia 06/15/2023   Lactic acidosis 06/15/2023   Recurrent falls 06/04/2023   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) 06/04/2023   Type 2 diabetes mellitus with hyperglycemia (HCC) 06/04/2023   BPH (benign prostatic hyperplasia) 06/04/2023   OSA on CPAP 06/04/2023   Gallstones 06/04/2023   Pain of right hip 06/04/2023   Abdominal pain 06/03/2023   Acute midline low back pain without sciatica 05/31/2023   Postviral syndrome 05/21/2023   Sinus tachycardia 05/21/2023   Vision loss, 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   Long term (current) use of oral hypoglycemic drugs 01/02/2023   CKD (chronic kidney disease) 04/25/2021   Diabetes (HCC) 03/19/2021   Trigger thumb of left hand 02/22/2021   Vitamin D deficiency 11/16/2020   Bradycardia 10/12/2020   Cardiac pacemaker - STJ 10/12/2020   Allergic rhinitis 10/31/2018   Paresthesias 08/08/2016   Mixed hyperlipidemia 09/26/2015   Carotid artery stenosis 09/23/2015   Abnormal blood chemistry 09/18/2014   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   Vertigo 09/13/2011   Encounter for well adult exam with abnormal  findings 03/11/2011   Nonspecific abnormal results of cardiovascular function study 02/15/2010   RENAL CALCULUS 02/11/2010   Prolonged QT interval 02/11/2010   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   Essential hypertension, benign 04/08/2008   GERD 04/08/2008   PSA, INCREASED 04/08/2008   NEPHROLITHIASIS, HX OF 04/08/2008    Orientation RESPIRATION BLADDER Height & Weight     Self, Time, Situation, Place  Normal Continent Weight: 177 lb 4 oz (80.4 kg) Height:  6' (182.9 cm)  BEHAVIORAL SYMPTOMS/MOOD NEUROLOGICAL BOWEL NUTRITION STATUS      Continent Diet (See dc summary)  AMBULATORY STATUS COMMUNICATION OF NEEDS Skin   Limited Assist Verbally Surgical wounds (closed incision on chest)                       Personal Care Assistance Level of Assistance  Bathing, Feeding, Dressing Bathing Assistance: Limited assistance Feeding assistance: Independent Dressing Assistance: Limited assistance     Functional Limitations Info             SPECIAL CARE FACTORS FREQUENCY  PT (By licensed PT), OT (By licensed OT)     PT Frequency: 5x/week OT Frequency: 5x/week            Contractures Contractures Info: Not present    Additional Factors Info  Code Status, Allergies, Insulin Sliding Scale Code Status Info: Full Allergies Info: NKA   Insulin Sliding Scale Info: see dc summary       Current Medications (06/27/2023):  This is the current hospital active medication list Current Facility-Administered Medications  Medication Dose Route Frequency Provider  Last Rate Last Admin   acetaminophen (TYLENOL) tablet 650 mg  650 mg Oral Q6H PRN Adefeso, Oladapo, DO   650 mg at 06/25/23 1342   Or   acetaminophen (TYLENOL) suppository 650 mg  650 mg Rectal Q6H PRN Adefeso, Oladapo, DO       alfuzosin (UROXATRAL) 24 hr tablet 10 mg  10 mg Oral QHS Adefeso, Oladapo, DO   10 mg at 06/26/23 2120   amLODipine (NORVASC) tablet 5 mg  5 mg Oral Daily Shahmehdi, Seyed A, MD   5 mg at 06/27/23 0900    aspirin EC tablet 81 mg  81 mg Oral Q breakfast Jonah Blue, MD   81 mg at 06/27/23 0859   cefTRIAXone (ROCEPHIN) 2 g in sodium chloride 0.9 % 100 mL IVPB  2 g Intravenous Q24H Vu, Trung T, MD 200 mL/hr at 06/27/23 1312 2 g at 06/27/23 1312   Chlorhexidine Gluconate Cloth 2 % PADS 6 each  6 each Topical Daily Jonah Blue, MD   6 each at 06/26/23 0853   feeding supplement (GLUCERNA SHAKE) (GLUCERNA SHAKE) liquid 237 mL  237 mL Oral TID BM Adefeso, Oladapo, DO   237 mL at 06/26/23 2010   fesoterodine (TOVIAZ) tablet 4 mg  4 mg Oral Daily Jonah Blue, MD   4 mg at 06/27/23 0858   HYDROmorphone (DILAUDID) injection 0.5 mg  0.5 mg Intravenous Q3H PRN Osvaldo Shipper, MD   0.5 mg at 06/27/23 1311   insulin aspart (novoLOG) injection 0-9 Units  0-9 Units Subcutaneous TID WC Adefeso, Oladapo, DO   3 Units at 06/27/23 1314   irbesartan (AVAPRO) tablet 75 mg  75 mg Oral Daily Shahmehdi, Seyed A, MD   75 mg at 06/27/23 0859   lidocaine (LIDODERM) 5 % 2 patch  2 patch Transdermal Q24H Osvaldo Shipper, MD   2 patch at 06/26/23 1758   methocarbamol (ROBAXIN) tablet 500 mg  500 mg Oral TID Jonah Blue, MD   500 mg at 06/27/23 0859   mirtazapine (REMERON) tablet 15 mg  15 mg Oral Noemi Chapel, MD   15 mg at 06/26/23 2121   oxyCODONE (Oxy IR/ROXICODONE) immediate release tablet 5 mg  5 mg Oral Q4H PRN Osvaldo Shipper, MD   5 mg at 06/27/23 1127   pantoprazole (PROTONIX) EC tablet 40 mg  40 mg Oral Daily Jonah Blue, MD   40 mg at 06/27/23 0859   polyethylene glycol (MIRALAX / GLYCOLAX) packet 17 g  17 g Oral Daily Osvaldo Shipper, MD   17 g at 06/26/23 3086   prochlorperazine (COMPAZINE) injection 10 mg  10 mg Intravenous Q6H PRN Adefeso, Oladapo, DO       rosuvastatin (CRESTOR) tablet 20 mg  20 mg Oral Daily Adefeso, Oladapo, DO   20 mg at 06/27/23 0859   senna-docusate (Senokot-S) tablet 2 tablet  2 tablet Oral BID Osvaldo Shipper, MD   2 tablet at 06/27/23 0900   sodium chloride  flush (NS) 0.9 % injection 10-40 mL  10-40 mL Intracatheter Q12H Jonah Blue, MD   10 mL at 06/27/23 0900   sodium chloride flush (NS) 0.9 % injection 10-40 mL  10-40 mL Intracatheter PRN Jonah Blue, MD       sodium chloride flush (NS) 0.9 % injection 3 mL  3 mL Intravenous Q12H Sherie Don, NP   3 mL at 06/27/23 0901     Discharge Medications: Please see discharge summary for a list of discharge medications.  Relevant Imaging Results:  Relevant Lab Results:   Additional Information SSN: [id number] [place], [place]

## 2023-06-27 NOTE — Progress Notes (Signed)
 Physical Therapy Treatment Patient Details Name: Grant Bowers MRN: [id number] DOB: May 03, 1945 Today's Date: 06/27/2023   History of Present Illness Pt is 78 yo presenting to [facility] ED on 3/6 due to increased pain in LB with concerns for discitis. 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 TEE 3/10. PPM removed 3/14. PMH: DM II, HTN, mixed hyperlipidemia, LBP, trifascicular block s/p PPM, OSA on CPAP, GERD.    PT Comments  Upon initially attempting session today, the pt was experiencing severe pain and had just received pain meds, requesting therapist return in ~30 min to allow pain meds to take effect. Upon return as requested, pt was still reporting pain, but it had reduced to allow him to tolerate participation in PT this date. Began session with supine core activation/strengthening/stability exercises to try to improve his core stability and thereby, ideally, improve his pain. Pt then was able to ambulate in the hall with the RW and CGA. He needs cues to relax his shoulders, stand upright, and perform a heel strike when ambulating. Will plan to practice stairs tomorrow as able. Will continue to follow acutely.      If plan is discharge home, recommend the following: Help with stairs or ramp for entrance;Assistance with cooking/housework;Assist for transportation;A little help with walking and/or transfers;A little help with bathing/dressing/bathroom   Can travel by private vehicle     Yes  Equipment Recommendations  Rolling walker (2 wheels)    Recommendations for Other Services       Precautions / Restrictions Precautions Precautions: Fall Recall of Precautions/Restrictions: Intact Precaution/Restrictions Comments: back precautions for comfort? Restrictions Weight Bearing Restrictions Per Provider Order: No     Mobility  Bed Mobility Overal bed mobility: Needs Assistance Bed Mobility: Rolling, Sidelying to Sit, Sit to Sidelying Rolling: Used rails, Supervision Sidelying to sit:  Used rails, Supervision     Sit to sidelying: Supervision, Used rails General bed mobility comments: Pt reporting increased pain today, resulting in slower transitions, supervision for safety log rolling and transitioning sidelying <> sit R EOB    Transfers Overall transfer level: Needs assistance Equipment used: Rolling walker (2 wheels) Transfers: Sit to/from Stand Sit to Stand: Contact guard assist           General transfer comment: Pt needs reminders to push up from bed instead of pulling up on RW. CGA for safety with pt initially swaying posteriorly but quickly regained balance    Ambulation/Gait Ambulation/Gait assistance: Contact guard assist Gait Distance (Feet): 160 Feet Assistive device: Rolling walker (2 wheels) Gait Pattern/deviations: Step-through pattern, Decreased stride length, Decreased dorsiflexion - left, Decreased dorsiflexion - right, Trunk flexed Gait velocity: WFL Gait velocity interpretation: 1.31 - 2.62 ft/sec, indicative of limited community ambulator   General Gait Details: Pt ambulates with heavy reliance on UEs, shoulders elevated, trunk flexed, and decreased bil feet clearance. Verbal and visual cues provided to relax shoulders, stand upright, and perform heel strike with gait. Good carryover noted. CGA for safety, no LOB   Stairs             Wheelchair Mobility     Tilt Bed    Modified Rankin (Stroke Patients Only)       Balance Overall balance assessment: Needs assistance Sitting-balance support: Feet supported Sitting balance-Leahy Scale: Fair Sitting balance - Comments: Pt sat EOB with supervision and varying UE support.   Standing balance support: Bilateral upper extremity supported, During functional activity, Reliant on assistive device for balance Standing balance-Leahy Scale: Poor Standing balance comment: Pt  dependent on RW for OOB mobility.                            Communication  Communication Communication: No apparent difficulties  Cognition Arousal: Alert Behavior During Therapy: WFL for tasks assessed/performed   PT - Cognitive impairments: No apparent impairments                         Following commands: Intact      Cueing Cueing Techniques: Verbal cues, Tactile cues, Gestural cues, Visual cues (for exercises)  Exercises Other Exercises Other Exercises: posterior pelvic tilt, x3 second holds, x10 reps supine Other Exercises: posterior pelvic tilt with heel slides, x10 reps bil, supine Other Exercises: supine marching, x10 reps bil Other Exercises: supine hip adduction pillow squeeze with bridges, x2-3 second hold, x10 reps    General Comments        Pertinent Vitals/Pain Pain Assessment Pain Assessment: Faces Faces Pain Scale: Hurts little more Pain Location: back Pain Descriptors / Indicators: Discomfort, Sore, Aching, Moaning, Grimacing, Guarding Pain Intervention(s): Limited activity within patient's tolerance, Monitored during session, Premedicated before session, Repositioned    Home Living                          Prior Function            PT Goals (current goals can now be found in the care plan section) Acute Rehab PT Goals Patient Stated Goal: Get stronger PT Goal Formulation: With patient Time For Goal Achievement: 07/01/23 Potential to Achieve Goals: Good Progress towards PT goals: Progressing toward goals    Frequency    Min 3X/week      PT Plan      Co-evaluation              AM-PAC PT "6 Clicks" Mobility   Outcome Measure  Help needed turning from your back to your side while in a flat bed without using bedrails?: A Little Help needed moving from lying on your back to sitting on the side of a flat bed without using bedrails?: A Little Help needed moving to and from a bed to a chair (including a wheelchair)?: A Little Help needed standing up from a chair using your arms (e.g.,  wheelchair or bedside chair)?: A Little Help needed to walk in hospital room?: A Little Help needed climbing 3-5 steps with a railing? : A Lot 6 Click Score: 17    End of Session Equipment Utilized During Treatment: Gait belt Activity Tolerance: Patient tolerated treatment well;Patient limited by pain Patient left: in bed;with bed alarm set;with call bell/phone within reach Nurse Communication: Mobility status PT Visit Diagnosis: Unsteadiness on feet (R26.81);Other abnormalities of gait and mobility (R26.89);Muscle weakness (generalized) (M62.81);Difficulty in walking, not elsewhere classified (R26.2);Pain Pain - Right/Left: Right Pain - part of body: Hip (Back)     Time: 7829-5621 PT Time Calculation (min) (ACUTE ONLY): 31 min  Charges:    $Gait Training: 8-22 mins $Therapeutic Exercise: 8-22 mins PT General Charges $$ ACUTE PT VISIT: 1 Visit                     Virgil Benedict, PT, DPT Acute Rehabilitation Services  Office: [phone]    Bettina Gavia 06/27/2023, 7:28 PM

## 2023-06-27 NOTE — Progress Notes (Signed)
 PROGRESS NOTE Grant Bowers  OZH:[id number] DOB: Nov 27, 1945 DOA: 06/14/2023 PCP: [name], MD  Brief Narrative/Hospital Course: 78yo with h/o DM, HTN, HLD, chronic LBP on oxycodone, pacemaker placement, OSA on CPAP, and GERD who presented on 3/6 with worsening back pain.  He was recently admitted from 2/23 to 2/25 due to cholelithiasis without acute cholecystitis which was treated symptomatically.  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  He underwent pacemaker extraction on 3/13 and PICC line post-extraction  and planning for 6 weeks of antibiotics-(EOT 4/19)  followed by PO Cefadroxil x 2 weeks.Needs outpatient dental follow up.  Patient stable for discharge awaiting for placement to a skilled nursing facility.   Subjective: Seen this am aaox3 had BM today ambulating well on RA Pain controlled Awaiting snf Has no complaints-denies current pain issues.   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 Neurosurgery was consulted and transferred to [facility] from [facility] for MRI but unable to get MRI due to type of pacemaker and lead position-likely source felt to be dental infection seen by ID underwent TEE that showed MV vegetation. CT surgery, cardiology consulted> pacemaker removed 3/14. Repeat  blood culture from 3/8 negative x 5 date. Per ID Plan is for Ceftriaxone x 6 weeks (EOT 4/19) and then Cefadroxil x 2 weeks has ID f/u on 4/3. F/u with cardiology-re PM plan and w/ Neurosurgery as well" after completion of antibiotics and dental extraction(s).   Unable to do epidural EPI steroid injection in the setting of active infection Continue to mobilize with PT OT awaiting on placement   Poor mobility: Pain limiting mobility continue to have PT OT: CIR consulted but he is unlikely to be able to participate in 3 hours of therapy daily and so may need SNF rehab and waiting for SNF. Cont pain control with Oxy, Dilaudid, Lidoderm, and Robaxin and Toradol looking to arrange for SNF rehab in [facility], preferably near [place]   Poor dentition: Needs dental extraction, hopefully prior to completion of antibiotics to reduce future infection risk-patient is aware.   Normocytic anemia: Hemoglobin stable continue to monitor  Recent Labs  Lab 06/21/23 0508 06/22/23 0539 06/23/23 0905 06/25/23 0255 06/27/23 0351  HGB 9.2* 8.9* 9.2* 9.1* 8.6*  HCT 27.9* 27.2* 28.1* 28.7* 26.9*    Essential hypertension: BP controlled continue amlodipine, irbesartan holding hydrochlorothiazide   BPH: Continue alfuzosin, Detrol   Hyperlipidemia: Continue rosuvastatin   Recently diagnosed cholelithiasis Outpatient follow-up with general surgery.   Diabetes mellitus type 2 with hyperglycemia HbA1c is 6.2, cont current regimenSSI, resume metformin upon discharge  Recent Labs  Lab 06/26/23 1202 06/26/23 1642 06/26/23 2110 06/27/23 0737 06/27/23 1146  GLUCAP 145* 194* 148* 129* 208*    DVT prophylaxis: Place and maintain sequential compression device Start: 06/21/23 1109 Code Status:   Code Status: Full Code Family Communication: plan of care discussed with patient at bedside. Patient status is: Remains  hospitalized because of severity of illness. Level of care: Med-Surg   Dispo: The  patient is from: home.            Anticipated disposition:SNF. Objective: Vitals last 24 hrs: Vitals:   06/26/23 1152 06/26/23 1937 06/27/23 0342 06/27/23 0734  BP: (!) 145/63 (!) 142/67  (!) 163/66  Pulse: 70 100  65  Resp: 18 16 16 18   Temp: 98.6 F (37 C) 98 F (36.7 C) 98 F (36.7 C) 98.6 F (37 C)  TempSrc: Oral Oral Oral Oral  SpO2: 98% 96%  97%  Weight:      Height:      Weight change:   Physical Examination: General exam: alert awake,at baseline, older than stated age HEENT:Oral mucosa moist, Ear/Nose WNL grossly Respiratory system: Bilaterally clear BS,no use of accessory muscle Cardiovascular system: S1 & S2 +, No JVD. Gastrointestinal system: Abdomen soft,NT,ND, BS+ Nervous System: Alert, awake, moving all extremities,and following commands. Extremities: LE edema neg,distal peripheral pulses palpable and warm.  Skin: No rashes,no icterus. MSK: Normal muscle bulk,tone, power   Medications reviewed:  Scheduled Meds:  alfuzosin  10 mg Oral QHS   amLODipine  5 mg Oral Daily   aspirin EC  81 mg Oral Q breakfast   Chlorhexidine Gluconate Cloth  6 each Topical Daily   feeding supplement (GLUCERNA SHAKE)  237 mL Oral TID BM   fesoterodine  4 mg Oral Daily   insulin aspart  0-9 Units Subcutaneous TID WC   irbesartan  75 mg Oral Daily   ketorolac  15 mg Intravenous Q6H   lidocaine  2 patch Transdermal Q24H   methocarbamol  500 mg Oral TID   mirtazapine  15 mg Oral QHS   pantoprazole  40 mg Oral Daily   polyethylene glycol  17 g Oral Daily   rosuvastatin  20 mg Oral Daily   senna-docusate  2 tablet Oral BID   sodium chloride flush  10-40 mL Intracatheter Q12H   sodium chloride flush  3 mL Intravenous Q12H  Continuous Infusions:  cefTRIAXone (ROCEPHIN)  IV Stopped (06/26/23 1759)    Diet Order             Diet - low sodium heart healthy           Diet Heart Room service appropriate? Yes; Fluid consistency: Thin  Diet effective now                   Intake/Output Summary (Last 24 hours) at 06/27/2023 1109 Last data filed at 06/27/2023 1017 Gross per 24 hour  Intake 420 ml  Output 620 ml  Net -200 ml   Net IO Since Admission: -118 mL [06/27/23 1109]  Wt Readings from Last 3 Encounters:  06/14/23 80.4 kg  06/04/23 80.4 kg  05/21/23 83.8 kg   Unresulted Labs (From admission, onward)    None     Data Reviewed: I have personally reviewed following labs and imaging studies CBC: Recent Labs  Lab 06/21/23 0508 06/22/23 0539 06/23/23 0905 06/25/23 0255 06/27/23 0351  WBC 8.1 8.5 8.0 8.3 7.1  NEUTROABS 6.3 7.5 6.0 5.9 5.0  HGB 9.2* 8.9* 9.2* 9.1* 8.6*  HCT 27.9* 27.2* 28.1* 28.7* 26.9*  MCV 82.3 81.7 83.4 84.2 84.1  PLT 299 300 288 278 240   Basic Metabolic Panel:  Recent Labs  Lab 06/21/23 0508 06/22/23 0539 06/23/23 0905 06/25/23 0255 06/27/23 0351  NA 137 133* 135 136 135  K 3.8 4.0 3.9 4.0 4.0  CL 110 107 107 106 106  CO2 20* 21* 23 23 22   GLUCOSE 152* 178* 132* 168* 146*  BUN 13 15 15 15  24*  CREATININE 0.80 0.99 0.90 1.10 1.03  CALCIUM 8.4* 8.4* 8.2* 8.2* 8.4*  MG 2.0  --   --   --   --   PHOS 3.0  --   --   --   --    GFR: Estimated Creatinine Clearance: 64.9 mL/min (by C-G formula based on SCr of 1.03 mg/dL). Liver Function Tests:  Recent Labs  Lab 06/21/23 0508 06/22/23 0539  AST 33 30  ALT 51* 45*  ALKPHOS 69 72  BILITOT 0.6 0.6  PROT 5.1* 5.3*  ALBUMIN 2.0* 2.1*   Recent Labs  Lab 06/26/23 0752 06/26/23 1202 06/26/23 1642 06/26/23 2110 06/27/23 0737  GLUCAP 139* 145* 194* 148* 129*  Sepsis Labs: Recent Labs  Lab 06/21/23 0508 06/22/23 0539  PROCALCITON <0.10 <0.10   Recent Results (from the past 240 hours)  MRSA Next Gen by PCR, Nasal     Status: None   Collection Time: 06/18/23  1:03 PM   Specimen: Nasal Mucosa; Nasal Swab  Result Value Ref Range Status   MRSA by PCR Next Gen NOT DETECTED NOT DETECTED Final    Comment: (NOTE) The GeneXpert MRSA Assay (FDA approved for  NASAL specimens only), is one component of a comprehensive MRSA colonization surveillance program. It is not intended to diagnose MRSA infection nor to guide or monitor treatment for MRSA infections. Test performance is not FDA approved in patients less than 54 years old. Performed at [facility], [address] [place], [place] [address]   Surgical PCR screen     Status: None   Collection Time: 06/20/23  5:18 PM   Specimen: Nasal Mucosa; Nasal Swab  Result Value Ref Range Status   MRSA, PCR NEGATIVE NEGATIVE Final   Staphylococcus aureus NEGATIVE NEGATIVE Final    Comment: (NOTE) The Xpert SA Assay (FDA approved for NASAL specimens in patients 80 years of age and older), is one component of a comprehensive surveillance program. It is not intended to diagnose infection nor to guide or monitor treatment. Performed at [facility], [address], [place], [place] [address]   Antimicrobials/Microbiology: Anti-infectives (From admission, onward)    Start     Dose/Rate Route Frequency Ordered Stop   06/22/23 0000  cefTRIAXone (ROCEPHIN) IVPB        2 g Intravenous Every 24 hours 06/22/23 0827 07/28/23 2359   06/21/23 1600  gentamicin (GARAMYCIN) 80 mg in sodium chloride 0.9 % 500 mL irrigation        80 mg Irrigation On call 06/21/23 0350 06/21/23 1545   06/21/23 1600  ceFAZolin (ANCEF) IVPB 2g/100 mL premix        2 g 200 mL/hr over 30 Minutes Intravenous On call 06/21/23 0350 06/21/23 1440   06/21/23 1339  ceFAZolin (ANCEF) 2-4 GM/100ML-% IVPB       Note to Pharmacy: Geanie Kenning A: cabinet override      06/21/23 1339 06/21/23 1444   06/21/23 0000  cefTRIAXone (ROCEPHIN) IVPB  Status:  Discontinued        2 g Intravenous Every 24 hours 06/21/23 1428 06/22/23    06/16/23 0000  nafcillin injection 2 g  Status:  Discontinued       Note to Pharmacy: Hold for now per ID Dr. Renold Don Please call before initiating antibiotics again   2 g Intravenous  Every 4 hours 06/15/23  0756 06/15/23 0947   06/15/23 1200  nafcillin injection 2 g  Status:  Discontinued        2 g Intravenous Every 4 hours 06/15/23 0346 06/15/23 0756   06/15/23 1000  cefTRIAXone (ROCEPHIN) 2 g in sodium chloride 0.9 % 100 mL IVPB        2 g 200 mL/hr over 30 Minutes Intravenous Every 24 hours 06/15/23 0947     06/14/23 2345  vancomycin (VANCOREADY) IVPB 1750 mg/350 mL        1,750 mg 175 mL/hr over 120 Minutes Intravenous  Once 06/14/23 2336 06/15/23 0228   06/14/23 1800  piperacillin-tazobactam (ZOSYN) IVPB 3.375 g        3.375 g 100 mL/hr over 30 Minutes Intravenous  Once 06/14/23 1749 06/14/23 2144         Component Value Date/Time   SDES BLOOD BLOOD LEFT ARM 06/16/2023 1348   SPECREQUEST  06/16/2023 1348    BOTTLES DRAWN AEROBIC AND ANAEROBIC Blood Culture adequate volume   CULT  06/16/2023 1348    NO GROWTH 5 DAYS Performed at [facility], [address] [place], [place] [address]    REPTSTATUS 06/21/2023 FINAL 06/16/2023 1348  Radiology Studies: No results found.  LOS: 12 days   Total time spent in review of labs and imaging, patient evaluation, formulation of plan, documentation and communication with family: 35 minutes.  Lanae Boast, MD [facility] 06/27/2023, 11:09 AM

## 2023-06-28 DIAGNOSIS — M4620 Osteomyelitis of vertebra, site unspecified: Secondary | ICD-10-CM | POA: Diagnosis not present

## 2023-06-28 LAB — GLUCOSE, CAPILLARY
Glucose-Capillary: 130 mg/dL — ABNORMAL HIGH (ref 70–99)
Glucose-Capillary: 141 mg/dL — ABNORMAL HIGH (ref 70–99)
Glucose-Capillary: 187 mg/dL — ABNORMAL HIGH (ref 70–99)
Glucose-Capillary: 120 mg/dL — ABNORMAL HIGH (ref 70–99)

## 2023-06-28 NOTE — TOC Progression Note (Addendum)
 Transition of Care [facility]) - Progression Note    Patient Details  Name: Grant Bowers MRN: [id number] Date of Birth: 1945/11/24  Transition of [facility] CM/SW Contact  Graves-Bigelow, Lamar Laundry, RN Phone Number: 06/28/2023, 10:28 AM  Clinical Narrative: Case Manager spoke with the patient this morning regarding plan of care. Patient states spouse was to go to two SNF's this am to review the building. Patient asked Case Manager to call the spouse on speaker phone and she states she is not going to look at the facilities-low star rating and she is not sending her spouse there. Spouse is irate and not trying to listen to Case Manager. Spouse states patient will stay in the hospital until the patients pain is controlled and that he has a regimen for home. Spouse is awaiting PT/OT to work with the patient on stairs and for the MD to call her. MD aware and PT/OT to call the spouse in the room. Wife is aware if the patient will return home that IV antibiotics have to be ordered and mixed via [employer]. Liaison Pam is aware that the patient is undecided regarding home. Case Manager will continue to follow for additional needs.   1348 06-28-23 Case manager spoke with spouse in the family waiting room regarding disposition plan. Spouse has declined SNF at this time and wants to take the patient home. PT/OT did work with the patient today and the plan is to work with patient tomorrow as well to see how he does on pain regimen. Anessa PT will correlate time of visit with spouse so the spouse will be here to see how patient does. Case Manager did relay to the patient that she will need to establish a PCP appointment for the patient and that the patient will benefit from a pain management clinic moving forward. Spouse did say that the patient has had one visit with the Pain Management Clinic and she will call to arrange a hospital follow up appointment. Case Manager was able to explain that in order to get the patient  out in a timely manner that antibiotics have to be ordered earlier enough to be mixed and delivered to the hospital. No further needs identified at this time.       Expected Discharge Plan: Skilled Nursing Facility Barriers to Discharge: Continued Medical Work up, Other (must enter comment), English as a second language teacher (Decision on SNF pending)  Expected Discharge Plan and Services In-house Referral: Clinical Social Work Discharge Planning Services: CM Consult Post Acute Care Choice: Home Health Living arrangements for the past 2 months: Single Family Home Expected Discharge Date: 06/22/23               DME Arranged: IV pump/equipment DME Agency:  Julianne Rice) Date DME Agency Contacted: 06/22/23 Time DME Agency Contacted: 1030 Representative spoke with at DME Agency: Pam HH Arranged: RN, Disease Management, PT HH Agency: [facility] Date [facility] Agency Contacted: 06/22/23 Time HH Agency Contacted: 1015 Representative spoke with at [facility] Agency: Lorie  Social Determinants of Health (SDOH) Interventions SDOH Screenings   Food Insecurity: No Food Insecurity (06/15/2023)  Housing: Low Risk  (06/15/2023)  Transportation Needs: No Transportation Needs (06/15/2023)  Utilities: Not At Risk (06/15/2023)  Depression (PHQ2-9): Low Risk  (04/17/2023)  Social Connections: Moderately Isolated (06/15/2023)  Tobacco Use: Low Risk  (06/21/2023)    Readmission Risk Interventions    06/15/2023    1:42 PM  Readmission Risk Prevention Plan  Transportation Screening Complete  HRI or Home Care Consult  Complete  Social Work Consult for Recovery Care Planning/Counseling Complete  Palliative Care Screening Not Applicable  Medication Review Oceanographer) Complete

## 2023-06-28 NOTE — Progress Notes (Signed)
 Physical Therapy Treatment Patient Details Name: Grant Bowers MRN: [id number] DOB: [birth year] Today's Date: 06/28/2023   History of Present Illness Pt is 78 yo presenting to [facility] ED on 3/6 due to increased pain in LB with concerns for discitis. 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 TEE 3/10. PPM removed 3/14. PMH: DM II, HTN, mixed hyperlipidemia, LBP, trifascicular block s/p PPM, OSA on CPAP, GERD.    PT Comments  Pt was pre-medicated with 5 mg oxycodone ~30 min before initiation of this session. Pt was limited by pain, only tolerating stair training today. He was unable to tolerate any further gait other than step pivoting back to bed after stair training due to the severity of the pain. While navigating stairs, he rushed through it, demonstrating poor awareness of his deficits and safety. He often needed cues to slow down and try to focus on quads activation as his knees consistently buckled with each step and he needed modA to prevent LOB. Will continue to follow acutely and plan to attempt next session without being pre-medicated per discussion with pt and his wife in regards to their worries about how he will function at home without strong pain meds to manage his pain. Provided pt with back precautions handout for him to follow for his comfort.     If plan is discharge home, recommend the following: Help with stairs or ramp for entrance;Assistance with cooking/housework;Assist for transportation;A little help with walking and/or transfers;A little help with bathing/dressing/bathroom   Can travel by private vehicle     Yes  Equipment Recommendations  Rolling walker (2 wheels)    Recommendations for Other Services       Precautions / Restrictions Precautions Precautions: Fall Recall of Precautions/Restrictions: Intact Precaution/Restrictions Comments: back precautions for comfort? (provided handout) Restrictions Weight Bearing Restrictions Per Provider Order: No     Mobility   Bed Mobility Overal bed mobility: Needs Assistance Bed Mobility: Rolling, Sidelying to Sit Rolling: Used rails, Supervision Sidelying to sit: Contact guard assist       General bed mobility comments: Pt used rail on R to roll to his R. Rail then dropped to simulate home. Pt needed multi-modal cues and extra time to problem-solve how to get up to his R hand from propping on his R elbow. Bed flat to simulate home    Transfers Overall transfer level: Needs assistance Equipment used: Rolling walker (2 wheels), None Transfers: Sit to/from Stand, Bed to chair/wheelchair/BSC Sit to Stand: Contact guard assist, Min assist   Step pivot transfers: Contact guard assist      Lateral/Scoot Transfers: Contact guard assist General transfer comment: Pt laterally scooted to L to chair from EOB but then step pivoted to R back to bed with RW support at end of session. Pt needed CGA to transfer to stand from recliner to RW but minA to transfer to stand from recliner to rails on stairs.    Ambulation/Gait Ambulation/Gait assistance: Contact guard assist Gait Distance (Feet): 3 Feet Assistive device: Rolling walker (2 wheels) Gait Pattern/deviations: Step-through pattern, Decreased stride length, Decreased dorsiflexion - left, Decreased dorsiflexion - right, Trunk flexed       General Gait Details: Pt took a few pivotal steps with RW to transfer from chair to bed, distance limited by pain, thus focused session primarily on stair training due to limited activity tolerance from pain.   Stairs Stairs: Yes Stairs assistance: Mod assist Stair Management: One rail Right, Step to pattern, Alternating pattern, Forwards, Sideways Number of Stairs: 4  General stair comments: Ascends forwards with bil hands on R rail, initially attempting reciprocal pattern but pt's knees consistently buckled with each step, thus cued pt to perform step-to pattern but buckling continued. Pt then descended sideways with bil UE  support on R rail with step-to pattern, knee buckling continued consistently with each step. ModA to recover and maintain his balance throughout. Pt was speeding to finish descending steps to return to sitting in chair due to severity of pain, not paying attention to cues to slow down and extend knees to improve his balance and safety   Wheelchair Mobility     Tilt Bed    Modified Rankin (Stroke Patients Only)       Balance Overall balance assessment: Needs assistance Sitting-balance support: Feet supported Sitting balance-Leahy Scale: Fair Sitting balance - Comments: Pt sat EOB with supervision and varying UE support.   Standing balance support: Bilateral upper extremity supported, During functional activity, Reliant on assistive device for balance Standing balance-Leahy Scale: Poor Standing balance comment: Pt dependent on UE support for OOB mobility.                            Communication Communication Communication: No apparent difficulties  Cognition Arousal: Alert Behavior During Therapy: WFL for tasks assessed/performed   PT - Cognitive impairments: Safety/Judgement                       PT - Cognition Comments: Pt internally distracted by pain and not aware of his safety concerns while he tried to speed through stair navigation while his knees frequently buckled. Cued pt to slow down and focus on knee extension to try to improve safety, but pt hurrying to finish and sit in chair due to pain. Following commands: Intact      Cueing Cueing Techniques: Verbal cues, Tactile cues, Visual cues  Exercises      General Comments General comments (skin integrity, edema, etc.): with pt and wife, discussed trying session without being premedicated next session to see how he does to see how he may do at home, they are in agreement      Pertinent Vitals/Pain Pain Assessment Pain Assessment: Faces Faces Pain Scale: Hurts whole lot Pain Location: back, R  hip Pain Descriptors / Indicators: Discomfort, Sore, Aching, Moaning, Grimacing, Guarding Pain Intervention(s): Limited activity within patient's tolerance, Monitored during session, Premedicated before session, Repositioned    Home Living                          Prior Function            PT Goals (current goals can now be found in the care plan section) Acute Rehab PT Goals Patient Stated Goal: Get stronger and reduce pain PT Goal Formulation: With patient/family Time For Goal Achievement: 07/01/23 Potential to Achieve Goals: Good Progress towards PT goals: Progressing toward goals    Frequency    Min 3X/week      PT Plan      Co-evaluation              AM-PAC PT "6 Clicks" Mobility   Outcome Measure  Help needed turning from your back to your side while in a flat bed without using bedrails?: A Little Help needed moving from lying on your back to sitting on the side of a flat bed without using bedrails?: A Little Help needed moving to  and from a bed to a chair (including a wheelchair)?: A Little Help needed standing up from a chair using your arms (e.g., wheelchair or bedside chair)?: A Little Help needed to walk in hospital room?: A Little Help needed climbing 3-5 steps with a railing? : A Lot 6 Click Score: 17    End of Session Equipment Utilized During Treatment: Gait belt Activity Tolerance: Patient limited by pain Patient left: in bed;with call bell/phone within reach;Other (comment) (with OT) Nurse Communication: Mobility status PT Visit Diagnosis: Unsteadiness on feet (R26.81);Other abnormalities of gait and mobility (R26.89);Muscle weakness (generalized) (M62.81);Difficulty in walking, not elsewhere classified (R26.2);Pain Pain - Right/Left: Right Pain - part of body: Hip (Back)     Time: 7829-5621 PT Time Calculation (min) (ACUTE ONLY): 33 min  Charges:    $Gait Training: 8-22 mins $Therapeutic Activity: 8-22 mins PT General  Charges $$ ACUTE PT VISIT: 1 Visit                     Virgil Benedict, PT, DPT Acute Rehabilitation Services  Office: [phone]    Bettina Gavia 06/28/2023, 3:38 PM

## 2023-06-28 NOTE — Progress Notes (Signed)
 PROGRESS NOTE Grant Bowers  NUU:[id number] DOB: 12-23-1945 DOA: 06/14/2023 PCP: [name], MD  Brief Narrative/Hospital Course: 78yo with h/o DM, HTN, HLD, chronic LBP on oxycodone, pacemaker placement, OSA on CPAP, and GERD who presented on 3/6 with worsening back pain.  He was recently admitted from 2/23 to 2/25 due to cholelithiasis without acute cholecystitis which was treated symptomatically.  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  He underwent pacemaker extraction on 3/13 and PICC line post-extraction  and planning for 6 weeks of antibiotics-(EOT 4/19)  followed by PO Cefadroxil x 2 weeks.Needs outpatient dental follow up.  Patient stable for discharge awaiting for placement to a skilled nursing facility.  Consultation: Infectious disease EP cardiology  Subjective: Seen and examined Pain controlled Eager to work w/ PTOT    Assessment and Plan: Principal Problem:   Vertebral osteomyelitis (HCC) Active Problems:   Essential hypertension, benign   Prolonged QT interval   Mixed hyperlipidemia   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)   Type 2 diabetes mellitus with hyperglycemia (HCC)   BPH (benign prostatic hyperplasia)   Hyponatremia   Lactic acidosis   Streptococcal endocarditis   Non-rheumatic mitral regurgitation   Acute back pain with vertebral osteomyelitis/Streptococcus mitis/oralis bacteremia/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 Neurosurgery was consulted and transferred to [facility] from [facility] for MRI but unable to get MRI due to type of pacemaker and lead position-likely source felt to be dental infection seen by ID underwent TEE that showed MV vegetation. CT surgery, cardiology consulted> pacemaker removed 3/14. Repeat blood culture from 3/8  negative x 5 date. Per ID Plan is for Ceftriaxone x 6 weeks (EOT 4/19) and then Cefadroxil x 2 weeks has ID f/u on 4/3. F/u with cardiology-re PM plan and w/ Neurosurgery as well" after completion of antibiotics and dental extraction(s).   Unable to do epidural EPI steroid injection in the setting of active infection. Continue to mobilize with PT OT, continue pain control with oxycodone IR along with ibuprofen Tylenol and Robaxin Awaiting on placement   Poor mobility: Pain limiting mobility continue to have PT OT: CIR consulted but he is unlikely to be able to participate in 3 hours of therapy daily and so may need SNF rehab and waiting for SNF. Cont pain control as above  looking to arrange for SNF rehab in [facility], preferably near [place]   Poor dentition: Needs dental extraction, hopefully prior to completion of antibiotics to reduce future infection risk-patient is aware.   Normocytic anemia: Hemoglobin stable 8 to 9 g.  Monitor.    Essential hypertension: BP controlled on amlodipine, irbesartan holding hydrochlorothiazide   BPH: Continue alfuzosin, Detrol   Hyperlipidemia: Continue rosuvastatin   Recently diagnosed cholelithiasis Outpatient follow-up with general surgery.   Diabetes mellitus type 2 with hyperglycemia HbA1c is 6.2, blood sugar well-controlled resume metformin upon discharge   DVT prophylaxis: Place and maintain sequential compression device Start: 06/21/23 1109 Code Status:   Code Status: Full Code Family Communication: plan of care discussed with patient/wife called on phone  Patient status is: Remains hospitalized because of severity of illness Level of care: Med-Surg   Dispo: The patient is from: home            Anticipated disposition: TBD snf vs HHC Objective: Vitals last 24 hrs: Vitals:   06/27/23  5621 06/27/23 2035 06/28/23 0415 06/28/23 0946  BP: (!) 163/66 (!) 143/74 (!) 156/76 (!) 153/75  Pulse: 65 (!) 108 97   Resp: 18 18 20     Temp: 98.6 F (37 C) 98.4 F (36.9 C) 98.2 F (36.8 C)   TempSrc: Oral Oral Oral   SpO2: 97% 98% 100%   Weight:      Height:       Weight change:   Physical Examination: General exam: alert awake,at baseline, older than stated age HEENT:Oral mucosa moist, Ear/Nose WNL grossly Respiratory system: Bilaterally clear BS,no use of accessory muscle Cardiovascular system: S1 & S2 +, No JVD. Gastrointestinal system: Abdomen soft,NT,ND, BS+ Nervous System: Alert, awake, moving all extremities,and following commands. Extremities: LE edema neg,distal peripheral pulses palpable and warm.  Skin: No rashes,no icterus. MSK: Normal muscle bulk,tone, power   Medications reviewed:  Scheduled Meds:  alfuzosin  10 mg Oral QHS   amLODipine  5 mg Oral Daily   aspirin EC  81 mg Oral Q breakfast   Chlorhexidine Gluconate Cloth  6 each Topical Daily   feeding supplement (GLUCERNA SHAKE)  237 mL Oral TID BM   fesoterodine  4 mg Oral Daily   insulin aspart  0-9 Units Subcutaneous TID WC   irbesartan  75 mg Oral Daily   lidocaine  2 patch Transdermal Q24H   methocarbamol  500 mg Oral TID   mirtazapine  15 mg Oral QHS   pantoprazole  40 mg Oral Daily   polyethylene glycol  17 g Oral Daily   rosuvastatin  20 mg Oral Daily   senna-docusate  2 tablet Oral BID   sodium chloride flush  10-40 mL Intracatheter Q12H   sodium chloride flush  3 mL Intravenous Q12H   Continuous Infusions:  cefTRIAXone (ROCEPHIN)  IV 2 g (06/28/23 0943)      Diet Order             Diet - low sodium heart healthy           Diet Heart Room service appropriate? Yes; Fluid consistency: Thin  Diet effective now                            Intake/Output Summary (Last 24 hours) at 06/28/2023 1325 Last data filed at 06/28/2023 1144 Gross per 24 hour  Intake 960 ml  Output 1300 ml  Net -340 ml   Net IO Since Admission: -458 mL [06/28/23 1325]  Wt Readings from Last 3 Encounters:  06/14/23 80.4 kg  06/04/23  80.4 kg  05/21/23 83.8 kg     Unresulted Labs (From admission, onward)    None      Data Reviewed: I have personally reviewed following labs and imaging studies CBC: Recent Labs  Lab 06/22/23 0539 06/23/23 0905 06/25/23 0255 06/27/23 0351  WBC 8.5 8.0 8.3 7.1  NEUTROABS 7.5 6.0 5.9 5.0  HGB 8.9* 9.2* 9.1* 8.6*  HCT 27.2* 28.1* 28.7* 26.9*  MCV 81.7 83.4 84.2 84.1  PLT 300 288 278 240   Basic Metabolic Panel:  Recent Labs  Lab 06/22/23 0539 06/23/23 0905 06/25/23 0255 06/27/23 0351  NA 133* 135 136 135  K 4.0 3.9 4.0 4.0  CL 107 107 106 106  CO2 21* 23 23 22   GLUCOSE 178* 132* 168* 146*  BUN 15 15 15  24*  CREATININE 0.99 0.90 1.10 1.03  CALCIUM 8.4* 8.2* 8.2* 8.4*   GFR: Estimated Creatinine Clearance: 64.9 mL/min (  by C-G formula based on SCr of 1.03 mg/dL). Liver Function Tests:  Recent Labs  Lab 06/22/23 0539  AST 30  ALT 45*  ALKPHOS 72  BILITOT 0.6  PROT 5.3*  ALBUMIN 2.1*   No results for input(s): "LIPASE", "AMYLASE" in the last 168 hours. No results for input(s): "AMMONIA" in the last 168 hours. Coagulation Profile: No results for input(s): "INR", "PROTIME" in the last 168 hours. No results for input(s): "PROBNP" in the last 168 hours.  No results for input(s): "HGBA1C" in the last 72 hours. Recent Labs  Lab 06/27/23 1146 06/27/23 1637 06/27/23 2128 06/28/23 0744 06/28/23 1143  GLUCAP 208* 116* 188* 130* 141*   No results for input(s): "CHOL", "HDL", "LDLCALC", "TRIG", "CHOLHDL", "LDLDIRECT" in the last 72 hours. No results for input(s): "TSH", "T4TOTAL", "FREET4", "T3FREE", "THYROIDAB" in the last 72 hours. Sepsis Labs: Recent Labs  Lab 06/22/23 0539  PROCALCITON <0.10   Recent Results (from the past 240 hours)  Surgical PCR screen     Status: None   Collection Time: 06/20/23  5:18 PM   Specimen: Nasal Mucosa; Nasal Swab  Result Value Ref Range Status   MRSA, PCR NEGATIVE NEGATIVE Final   Staphylococcus aureus NEGATIVE NEGATIVE  Final    Comment: (NOTE) The Xpert SA Assay (FDA approved for NASAL specimens in patients 22 years of age and older), is one component of a comprehensive surveillance program. It is not intended to diagnose infection nor to guide or monitor treatment. Performed at [facility], [address], [place], [place] [address]     Antimicrobials/Microbiology: Anti-infectives (From admission, onward)    Start     Dose/Rate Route Frequency Ordered Stop   06/22/23 0000  cefTRIAXone (ROCEPHIN) IVPB        2 g Intravenous Every 24 hours 06/22/23 0827 07/28/23 2359   06/21/23 1600  gentamicin (GARAMYCIN) 80 mg in sodium chloride 0.9 % 500 mL irrigation        80 mg Irrigation On call 06/21/23 0350 06/21/23 1545   06/21/23 1600  ceFAZolin (ANCEF) IVPB 2g/100 mL premix        2 g 200 mL/hr over 30 Minutes Intravenous On call 06/21/23 0350 06/21/23 1440   06/21/23 1339  ceFAZolin (ANCEF) 2-4 GM/100ML-% IVPB       Note to Pharmacy: Geanie Kenning A: cabinet override      06/21/23 1339 06/21/23 1444   06/21/23 0000  cefTRIAXone (ROCEPHIN) IVPB  Status:  Discontinued        2 g Intravenous Every 24 hours 06/21/23 1428 06/22/23    06/16/23 0000  nafcillin injection 2 g  Status:  Discontinued       Note to Pharmacy: Hold for now per ID Dr. Renold Don Please call before initiating antibiotics again   2 g Intravenous Every 4 hours 06/15/23 0756 06/15/23 0947   06/15/23 1200  nafcillin injection 2 g  Status:  Discontinued        2 g Intravenous Every 4 hours 06/15/23 0346 06/15/23 0756   06/15/23 1000  cefTRIAXone (ROCEPHIN) 2 g in sodium chloride 0.9 % 100 mL IVPB        2 g 200 mL/hr over 30 Minutes Intravenous Every 24 hours 06/15/23 0947     06/14/23 2345  vancomycin (VANCOREADY) IVPB 1750 mg/350 mL        1,750 mg 175 mL/hr over 120 Minutes Intravenous  Once 06/14/23 2336 06/15/23 0228   06/14/23 1800  piperacillin-tazobactam (ZOSYN) IVPB 3.375 g  3.375 g 100 mL/hr over 30 Minutes  Intravenous  Once 06/14/23 1749 06/14/23 2144         Component Value Date/Time   SDES BLOOD BLOOD LEFT ARM 06/16/2023 1348   SPECREQUEST  06/16/2023 1348    BOTTLES DRAWN AEROBIC AND ANAEROBIC Blood Culture adequate volume   CULT  06/16/2023 1348    NO GROWTH 5 DAYS Performed at [facility], [address], [place], [place] [address]    REPTSTATUS 06/21/2023 FINAL 06/16/2023 1348     Radiology Studies: No results found.   LOS: 13 days   Total time spent in review of labs and imaging, patient evaluation, formulation of plan, documentation and communication with family: 35 minutes  Lanae Boast, MD  [facility]  06/28/2023, 1:25 PM

## 2023-06-28 NOTE — TOC Progression Note (Addendum)
 Transition of Care [facility]) - Progression Note    Patient Details  Name: Grant Bowers MRN: [id number] Date of Birth: 07-Jul-1945  [facility] CM/SW Contact  Michaela Corner, [place] Phone Number: 06/28/2023, 10:50 AM  Clinical Narrative:   CSW provided additional bed offers to pt with medicare.gov ratings. Upon entry to patients room, he was in the bathroom and asked CSW to follow up later. CSW left bed offers on patients bedside table. CSW called patients wife, Fannie Knee, and informed her that additional bed offers are awaiting in patients room. Fannie Knee stated she will be up to the hospital in about an hour.   1:59 PM CSW spoke with Fannie Knee about plans for discharge. Fannie Knee stated that pt will be going home. She stated it is contingent on how patient does tomorrow when working with PT. No further needs identified for CSW.  TOC will continue to follow.    Expected Discharge Plan: Skilled Nursing Facility Barriers to Discharge: Continued Medical Work up, Other (must enter comment), English as a second language teacher (Decision on SNF pending)  Expected Discharge Plan and Services In-house Referral: Clinical Social Work Discharge Planning Services: CM Consult Post Acute Care Choice: Home Health Living arrangements for the past 2 months: Single Family Home Expected Discharge Date: 06/22/23               DME Arranged: IV pump/equipment DME Agency:  Julianne Rice) Date DME Agency Contacted: 06/22/23 Time DME Agency Contacted: 1030 Representative spoke with at DME Agency: Pam HH Arranged: RN, Disease Management, PT HH Agency: [facility] Date [facility] Agency Contacted: 06/22/23 Time HH Agency Contacted: 1015 Representative spoke with at [facility] Agency: Lorie   Social Determinants of Health (SDOH) Interventions SDOH Screenings   Food Insecurity: No Food Insecurity (06/15/2023)  Housing: Low Risk  (06/15/2023)  Transportation Needs: No Transportation Needs (06/15/2023)  Utilities: Not At Risk  (06/15/2023)  Depression (PHQ2-9): Low Risk  (04/17/2023)  Social Connections: Moderately Isolated (06/15/2023)  Tobacco Use: Low Risk  (06/21/2023)    Readmission Risk Interventions    06/15/2023    1:42 PM  Readmission Risk Prevention Plan  Transportation Screening Complete  HRI or Home Care Consult Complete  Social Work Consult for Recovery Care Planning/Counseling Complete  Palliative Care Screening Not Applicable  Medication Review Oceanographer) Complete

## 2023-06-28 NOTE — Progress Notes (Signed)
 Occupational Therapy Treatment Patient Details Name: Grant Bowers MRN: [id number] DOB: 10-02-1945 Today's Date: 06/28/2023   History of present illness Pt is 78 yo presenting to [facility] ED on 3/6 due to increased pain in LB with concerns for discitis. 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 TEE 3/10. PPM removed 3/14. PMH: DM II, HTN, mixed hyperlipidemia, LBP, trifascicular block s/p PPM, OSA on CPAP, GERD.   OT comments  Pt premedicated for pain prior to therapy attempts. Pt seen after PT session/stair training with minimal tolerance for OT session d/t continued pain. Pt assisted from recliner back to bed w/ CGA using RW w/ pain improving when supine. Wife entered during session, planned to educate on how to use gait belt w/ pt though pt was unable to successfully complete OOB activities again at this time. Emphasis on assessing movements that are tolerable vs trigger pain. Pt appeared open to postacute rehab stay but wife reports plan to DC home hopefully tomorrow. OT did express concerns regarding pt pain tolerance for car ride home (45 min to 1 hour ride).       If plan is discharge home, recommend the following:  A lot of help with bathing/dressing/bathroom;Assistance with cooking/housework;Assist for transportation;Help with stairs or ramp for entrance;A little help with walking and/or transfers   Equipment Recommendations  Hospital bed;Wheelchair cushion (measurements OT) (wife reports having BSC, RW and wheelchair at home. need to confirm if they have a wheelchair cushion)    Recommendations for Other Services      Precautions / Restrictions Precautions Precautions: Fall Recall of Precautions/Restrictions: Intact Restrictions Weight Bearing Restrictions Per Provider Order: No       Mobility Bed Mobility Overal bed mobility: Needs Assistance Bed Mobility: Rolling, Sit to Sidelying Rolling: Supervision       Sit to sidelying: Min assist General bed mobility comments: Min A  for LE back to bed after working with PT. Able to roll side to side to assess pain levels using bedrails. Attempted sidelying to sit EOB but pt unable reporting sharp, catching pains.    Transfers Overall transfer level: Needs assistance Equipment used: Rolling walker (2 wheels) Transfers: Sit to/from Stand, Bed to chair/wheelchair/BSC Sit to Stand: Contact guard assist Stand pivot transfers: Contact guard assist         General transfer comment: Cues to push from recliner armests (at end of PT session). Pt able to stand and turn to bedside using RW with CGA     Balance Overall balance assessment: Needs assistance Sitting-balance support: Feet supported Sitting balance-Leahy Scale: Poor Sitting balance - Comments: reliant on UE support due to pain   Standing balance support: Bilateral upper extremity supported, During functional activity, Reliant on assistive device for balance Standing balance-Leahy Scale: Poor                             ADL either performed or assessed with clinical judgement   ADL Overall ADL's : Needs assistance/impaired                                       General ADL Comments: pt again limited by pain despite premedication. Poor tolerance for OT session after PT session/stair training. Wife entering during session. Pt agreeable to attempt OOB again to show wife how to use gait belt but pt unable to get EOB again due to pain.  Assessed various movements/positioning to determine pain triggers, educated pt if fall did occur when wife using gait belt, she would need to use belt to lower pt to ground for their safety, not to catch/pull him back up. Pt appears interested in rehab and wife preference to return home. Expressed concerns with pt tolerance for sitting upright if ride home is > 45 minutes and pt unable to tolerate sitting in chair more than a few minutes this week.    Extremity/Trunk Assessment Upper Extremity  Assessment Upper Extremity Assessment: Overall WFL for tasks assessed;Right hand dominant   Lower Extremity Assessment Lower Extremity Assessment: Defer to PT evaluation        Vision   Vision Assessment?: No apparent visual deficits   Perception     Praxis     Communication Communication Communication: No apparent difficulties   Cognition Arousal: Alert Behavior During Therapy: WFL for tasks assessed/performed Cognition: No apparent impairments             OT - Cognition Comments: WFL, does benefit from cues for safety and problem solving strategies but likely distracted by pain levels                 Following commands: Intact        Cueing   Cueing Techniques: Verbal cues, Gestural cues  Exercises Exercises: Other exercises Other Exercises Other Exercises: A/AROM hip/knee flexion/extension, attempted bridging but pt unable to tolerate    Shoulder Instructions       General Comments Wife entering during session.    Pertinent Vitals/ Pain       Pain Assessment Pain Assessment: Faces Faces Pain Scale: Hurts whole lot Pain Location: back, B hips (R>L) Pain Descriptors / Indicators: Discomfort, Sore, Aching, Moaning, Grimacing, Guarding Pain Intervention(s): Monitored during session, Limited activity within patient's tolerance, Premedicated before session  Home Living                                          Prior Functioning/Environment              Frequency  Min 1X/week        Progress Toward Goals  OT Goals(current goals can now be found in the care plan section)  Progress towards OT goals: OT to reassess next treatment  Acute Rehab OT Goals Patient Stated Goal: pain control. pt open to rehab but hopes to go home OT Goal Formulation: With patient Time For Goal Achievement: 07/01/23 Potential to Achieve Goals: Good ADL Goals Pt Will Perform Upper Body Dressing: with modified independence Pt Will Perform Lower  Body Dressing: with modified independence Pt Will Transfer to Toilet: with modified independence Pt Will Perform Toileting - Clothing Manipulation and hygiene: with modified independence  Plan      Co-evaluation                 AM-PAC OT "6 Clicks" Daily Activity     Outcome Measure   Help from another person eating meals?: None Help from another person taking care of personal grooming?: A Little Help from another person toileting, which includes using toliet, bedpan, or urinal?: A Lot Help from another person bathing (including washing, rinsing, drying)?: A Lot Help from another person to put on and taking off regular upper body clothing?: A Little Help from another person to put on and taking off regular lower body clothing?: A Lot  6 Click Score: 16    End of Session Equipment Utilized During Treatment: Gait belt;Rolling walker (2 wheels)  OT Visit Diagnosis: Unsteadiness on feet (R26.81);Other abnormalities of gait and mobility (R26.89);Muscle weakness (generalized) (M62.81);Pain Pain - part of body: Hip   Activity Tolerance Patient limited by pain   Patient Left in bed;with call bell/phone within reach;with family/visitor present   Nurse Communication Mobility status        Time: 3474-2595 OT Time Calculation (min): 35 min  Charges: OT General Charges $OT Visit: 1 Visit OT Treatments $Self Care/Home Management : 8-22 mins $Therapeutic Activity: 8-22 mins  Bradd Canary, OTR/L Acute Rehab Services Office: [phone]   Lorre Munroe 06/28/2023, 2:13 PM

## 2023-06-29 DIAGNOSIS — M4620 Osteomyelitis of vertebra, site unspecified: Secondary | ICD-10-CM | POA: Diagnosis not present

## 2023-06-29 LAB — GLUCOSE, CAPILLARY
Glucose-Capillary: 129 mg/dL — ABNORMAL HIGH (ref 70–99)
Glucose-Capillary: 140 mg/dL — ABNORMAL HIGH (ref 70–99)
Glucose-Capillary: 164 mg/dL — ABNORMAL HIGH (ref 70–99)

## 2023-06-29 MED ORDER — IBUPROFEN 200 MG PO TABS
400.0000 mg | ORAL_TABLET | Freq: Once | ORAL | Status: AC
  Administered 2023-06-29: 400 mg via ORAL
  Filled 2023-06-29: qty 2

## 2023-06-29 NOTE — Progress Notes (Addendum)
 PROGRESS NOTE Per Beagley  ZOX:[id number] DOB: 06-09-1945 DOA: 06/14/2023 PCP: [name], MD  Brief Narrative/Hospital Course: 78yo with h/o DM, HTN, HLD, chronic LBP on oxycodone, pacemaker placement, OSA on CPAP, and GERD who presented on 3/6 with worsening back pain.  He was recently admitted from 2/23 to 2/25 due to cholelithiasis without acute cholecystitis which was treated symptomatically.  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  He underwent pacemaker extraction on 3/13 and PICC line post-extraction  and planning for 6 weeks of antibiotics-(EOT 4/19)  followed by PO Cefadroxil x 2 weeks.Needs outpatient dental follow up.  Patient stable for discharge awaiting for placement to a skilled nursing facility. Subsequently patient did very well with ambulation, wife would like to take him home on 3/21  Consultation: Infectious disease EP cardiology Subjective: Seen and examined this morning, resting comfortably no complaints  Eager to go home after PT OT today  Wife unable to pick up after tonight meds- requesting for dc after am meds   Assessment and Plan: Principal Problem:   Vertebral osteomyelitis (HCC) Active Problems:   Essential hypertension, benign   Prolonged QT interval   Mixed hyperlipidemia   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)   Type 2 diabetes mellitus with hyperglycemia (HCC)   BPH (benign prostatic hyperplasia)   Hyponatremia   Lactic acidosis   Streptococcal endocarditis   Non-rheumatic mitral regurgitation   Acute back pain with vertebral osteomyelitis/Streptococcus mitis/oralis bacteremia/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 Neurosurgery was consulted and transferred to [facility] from [facility] for MRI but unable to get MRI due to type of  pacemaker and lead position-likely source felt to be dental infection seen by ID underwent TEE that showed MV vegetation. CT surgery, cardiology consulted> pacemaker removed 3/14. Repeat blood culture from 3/8 negative x 5 date. Per ID Plan is for Ceftriaxone x 6 weeks (EOT 4/19) and then Cefadroxil x 2 weeks has ID f/u on 4/3. F/u with cardiology-re PM plan and w/ Neurosurgery as well" after completion of antibiotics and dental extraction(s).   Unable to do epidural EPI steroid injection in the setting of active infection. Continue to mobilize with PT OT, continue pain control with oxycodone IR along with ibuprofen Tylenol and Robaxin Patient requesting to go home but per home health requesting antibiotics tonight in the hospital and they will start antibiotics from tomorrow   Poor mobility: Pain limiting mobility continue to have PT OT: CIR consulted but he is unlikely to be able to participate in 3 hours of therapy daily and so may need SNF rehab and waiting for SNF. Cont pain control as above  looking to arrange for SNF rehab in [facility], preferably near [place]   Poor dentition: Needs dental extraction, hopefully prior to completion of antibiotics to reduce future infection risk-patient is aware.   Normocytic anemia: Hemoglobin stable 8 to 9 g.  Monitor.    Essential hypertension: BP controlled on amlodipine, irbesartan holding hydrochlorothiazide   BPH: Continue alfuzosin, Detrol   Hyperlipidemia: Continue rosuvastatin   Recently diagnosed cholelithiasis Outpatient follow-up with general surgery.   Diabetes mellitus type 2 with hyperglycemia HbA1c is 6.2, blood sugar well-controlled resume metformin upon discharge   DVT prophylaxis: Place and maintain sequential compression device Start: 06/21/23 1109 Code Status:   Code Status: Full Code Family Communication: plan of  care discussed with patient/wife called on phone  Patient status is: Remains hospitalized because of  severity of illness Level of care: Med-Surg   Dispo: The patient is from: home            Anticipated disposition: home tomorrow Objective: Vitals last 24 hrs: Vitals:   06/28/23 1407 06/28/23 1947 06/29/23 0319 06/29/23 0720  BP: 137/69 139/73 (!) 157/68 (!) 143/81  Pulse: 97 98 (!) 106 100  Resp: 18 16 18 18   Temp: 98 F (36.7 C) 98.4 F (36.9 C) 98.9 F (37.2 C) 98.9 F (37.2 C)  TempSrc: Oral Oral Oral Oral  SpO2: 96% 97% 90% 96%  Weight:      Height:       Weight change:   Physical Examination: General exam: alert awake,  HEENT:Oral mucosa moist, Ear/Nose WNL grossly Respiratory system: Bilaterally clear BS,no use of accessory muscle Cardiovascular system: S1 & S2 +, No JVD. Gastrointestinal system: Abdomen soft,NT,ND, BS+ Nervous System: Alert, awake, moving all extremities,and following commands. Extremities: LE edema neg,distal peripheral pulses palpable and warm.  Skin: No rashes,no icterus. MSK: Normal muscle bulk,tone, power   Medications reviewed:  Scheduled Meds:  alfuzosin  10 mg Oral QHS   amLODipine  5 mg Oral Daily   aspirin EC  81 mg Oral Q breakfast   Chlorhexidine Gluconate Cloth  6 each Topical Daily   feeding supplement (GLUCERNA SHAKE)  237 mL Oral TID BM   fesoterodine  4 mg Oral Daily   insulin aspart  0-9 Units Subcutaneous TID WC   irbesartan  75 mg Oral Daily   lidocaine  2 patch Transdermal Q24H   methocarbamol  500 mg Oral TID   mirtazapine  15 mg Oral QHS   pantoprazole  40 mg Oral Daily   polyethylene glycol  17 g Oral Daily   rosuvastatin  20 mg Oral Daily   senna-docusate  2 tablet Oral BID   sodium chloride flush  10-40 mL Intracatheter Q12H   sodium chloride flush  3 mL Intravenous Q12H   Continuous Infusions:  cefTRIAXone (ROCEPHIN)  IV 2 g (06/29/23 0925)      Diet Order             Diet - low sodium heart healthy           Diet Heart Room service appropriate? Yes; Fluid consistency: Thin  Diet effective now                             Intake/Output Summary (Last 24 hours) at 06/29/2023 1435 Last data filed at 06/29/2023 1050 Gross per 24 hour  Intake 240 ml  Output 675 ml  Net -435 ml   Net IO Since Admission: -893 mL [06/29/23 1435]  Wt Readings from Last 3 Encounters:  06/14/23 80.4 kg  06/04/23 80.4 kg  05/21/23 83.8 kg     Unresulted Labs (From admission, onward)    None      Data Reviewed: I have personally reviewed following labs and imaging studies CBC: Recent Labs  Lab 06/23/23 0905 06/25/23 0255 06/27/23 0351  WBC 8.0 8.3 7.1  NEUTROABS 6.0 5.9 5.0  HGB 9.2* 9.1* 8.6*  HCT 28.1* 28.7* 26.9*  MCV 83.4 84.2 84.1  PLT 288 278 240   Basic Metabolic Panel:  Recent Labs  Lab 06/23/23 0905 06/25/23 0255 06/27/23 0351  NA 135 136 135  K 3.9 4.0 4.0  CL 107 106 106  CO2 23 23 22   GLUCOSE 132* 168* 146*  BUN 15 15 24*  CREATININE 0.90 1.10 1.03  CALCIUM 8.2* 8.2* 8.4*   GFR: Estimated Creatinine Clearance: 64.9 mL/min (by C-G formula based on SCr of 1.03 mg/dL). Liver Function Tests:  No results for input(s): "AST", "ALT", "ALKPHOS", "BILITOT", "PROT", "ALBUMIN" in the last 168 hours.  No results for input(s): "LIPASE", "AMYLASE" in the last 168 hours. No results for input(s): "AMMONIA" in the last 168 hours. Coagulation Profile: No results for input(s): "INR", "PROTIME" in the last 168 hours. No results for input(s): "PROBNP" in the last 168 hours.  No results for input(s): "HGBA1C" in the last 72 hours. Recent Labs  Lab 06/28/23 1143 06/28/23 1652 06/28/23 2117 06/29/23 0748 06/29/23 1157  GLUCAP 141* 187* 120* 129* 140*   No results for input(s): "CHOL", "HDL", "LDLCALC", "TRIG", "CHOLHDL", "LDLDIRECT" in the last 72 hours. No results for input(s): "TSH", "T4TOTAL", "FREET4", "T3FREE", "THYROIDAB" in the last 72 hours. Sepsis Labs: No results for input(s): "PROCALCITON", "LATICACIDVEN" in the last 168 hours.  Recent Results (from the past 240  hours)  Surgical PCR screen     Status: None   Collection Time: 06/20/23  5:18 PM   Specimen: Nasal Mucosa; Nasal Swab  Result Value Ref Range Status   MRSA, PCR NEGATIVE NEGATIVE Final   Staphylococcus aureus NEGATIVE NEGATIVE Final    Comment: (NOTE) The Xpert SA Assay (FDA approved for NASAL specimens in patients 69 years of age and older), is one component of a comprehensive surveillance program. It is not intended to diagnose infection nor to guide or monitor treatment. Performed at [facility], [address], [place], [place] [address]     Antimicrobials/Microbiology: Anti-infectives (From admission, onward)    Start     Dose/Rate Route Frequency Ordered Stop   06/22/23 0000  cefTRIAXone (ROCEPHIN) IVPB        2 g Intravenous Every 24 hours 06/22/23 0827 07/28/23 2359   06/21/23 1600  gentamicin (GARAMYCIN) 80 mg in sodium chloride 0.9 % 500 mL irrigation        80 mg Irrigation On call 06/21/23 0350 06/21/23 1545   06/21/23 1600  ceFAZolin (ANCEF) IVPB 2g/100 mL premix        2 g 200 mL/hr over 30 Minutes Intravenous On call 06/21/23 0350 06/21/23 1440   06/21/23 1339  ceFAZolin (ANCEF) 2-4 GM/100ML-% IVPB       Note to Pharmacy: Geanie Kenning A: cabinet override      06/21/23 1339 06/21/23 1444   06/21/23 0000  cefTRIAXone (ROCEPHIN) IVPB  Status:  Discontinued        2 g Intravenous Every 24 hours 06/21/23 1428 06/22/23    06/16/23 0000  nafcillin injection 2 g  Status:  Discontinued       Note to Pharmacy: Hold for now per ID Dr. Renold Don Please call before initiating antibiotics again   2 g Intravenous Every 4 hours 06/15/23 0756 06/15/23 0947   06/15/23 1200  nafcillin injection 2 g  Status:  Discontinued        2 g Intravenous Every 4 hours 06/15/23 0346 06/15/23 0756   06/15/23 1000  cefTRIAXone (ROCEPHIN) 2 g in sodium chloride 0.9 % 100 mL IVPB        2 g 200 mL/hr over 30 Minutes Intravenous Every 24 hours 06/15/23 0947     06/14/23 2345  vancomycin  (VANCOREADY) IVPB 1750 mg/350 mL  1,750 mg 175 mL/hr over 120 Minutes Intravenous  Once 06/14/23 2336 06/15/23 0228   06/14/23 1800  piperacillin-tazobactam (ZOSYN) IVPB 3.375 g        3.375 g 100 mL/hr over 30 Minutes Intravenous  Once 06/14/23 1749 06/14/23 2144         Component Value Date/Time   SDES BLOOD BLOOD LEFT ARM 06/16/2023 1348   SPECREQUEST  06/16/2023 1348    BOTTLES DRAWN AEROBIC AND ANAEROBIC Blood Culture adequate volume   CULT  06/16/2023 1348    NO GROWTH 5 DAYS Performed at [facility], [address], [place], [place] [address]    REPTSTATUS 06/21/2023 FINAL 06/16/2023 1348     Radiology Studies: No results found.   LOS: 14 days   Total time spent in review of labs and imaging, patient evaluation, formulation of plan, documentation and communication with family: 35 minutes  Lanae Boast, MD  [facility]  06/29/2023, 2:35 PM

## 2023-06-29 NOTE — Progress Notes (Signed)
 Pt ambulated to the bathroom 1 assist with walker. He complained of pain when standing up from the bed initially but tolerated ambulating to the bathroom, sitting on the toilet, and standing to walk back to the bed.

## 2023-06-29 NOTE — Discharge Summary (Addendum)
 Physician Discharge Summary  Grant Bowers WJX:[id number] DOB: 1945-06-07 DOA: 06/14/2023  PCP: [name], MD  Admit date: 06/14/2023 Discharge date: 06/30/2023 Recommendations for Outpatient Follow-up:  Follow up with PCP in 1 weeks-call for appointment Please obtain BMP/CBC in one week  Discharge Dispo: home w/ hh Discharge Condition: Stable Code Status:   Code Status: Full Code Diet recommendation:  Diet Order             Diet - low sodium heart healthy           Diet Heart Room service appropriate? 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  He was recently admitted from 2/23 to 2/25 due to cholelithiasis without acute cholecystitis which was treated symptomatically.  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  He underwent pacemaker extraction on 3/13 and PICC line post-extraction  and planning for 6 weeks of antibiotics-(EOT 4/19)  followed by PO Cefadroxil x 2 weeks.Needs outpatient dental follow up.  Patient stable for discharge awaiting for placement to a skilled nursing facility. Subsequently patient did very well with ambulation, 78 wife would like to take him home on 3/21  Consultation: Infectious disease EP cardiology    Discharge Diagnoses:  Principal Problem:   Vertebral osteomyelitis (HCC) Active Problems:   Essential hypertension, benign   Prolonged QT interval   Mixed hyperlipidemia   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)   Type 2 diabetes mellitus with hyperglycemia (HCC)   BPH (benign prostatic hyperplasia)   Hyponatremia   Lactic acidosis   Streptococcal endocarditis   Non-rheumatic mitral regurgitation  Acute back pain  with vertebral osteomyelitis/Streptococcus mitis/oralis bacteremia/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 Neurosurgery was consulted and transferred to [facility] for MRI but unable to get MRI due to type of pacemaker and lead position-likely source felt to be dental infection seen by ID underwent TEE that showed MV vegetation. CT surgery, cardiology consulted> pacemaker removed 3/14. Repeat blood culture from 3/8 negative x 5 date. Per ID Plan is for Ceftriaxone x 6 weeks (EOT 4/19) and then Cefadroxil x 2 weeks has ID f/u on 4/3. F/u with cardiology-re PM plan and w/ Neurosurgery as well" after completion of antibiotics and dental extraction(s).   Unable to do epidural EPI steroid injection in the setting of active infection. At this time his pain is well-controlled continue narcotics/nonnarcotics PT OT and mobilize. 78 He is going home this morning.  Poor mobility: Pain limiting mobility continue to have PT OT: Going home today.   Poor dentition: Needs dental extraction, hopefully prior to completion of antibiotics to reduce future infection risk-patient is aware.   Normocytic anemia: Hemoglobin stable 8 to 9 g.  Monitor as OP.     Essential hypertension: BP stable continue amlodipine, irbesartan holding hydrochlorothiazide   BPH: Continue alfuzosin, Detrol   Hyperlipidemia: Continue rosuvastatin   Recently diagnosed cholelithiasis Outpatient follow-up with general surgery.   Diabetes mellitus type 2 with hyperglycemia HbA1c is 6.2, blood sugar well-controlled resume metformin upon discharge    Subjective: Daughter oriented.  Pain controlled.  He  is eager to go home this morning.   Patient requesting 1 more day due to need for IV antibiotics this morning to set up home health arrangement from today   Discharge Exam: Vitals:   06/29/23 2019 06/30/23 0350  BP: (!) 160/73 (!) 152/82  Pulse:  97 83  Resp: 18 20  Temp: 98.2 F (36.8 C) 98.3 F (36.8 C)  SpO2: 95% 100%   General: Pt is alert, awake, not in acute distress Cardiovascular: RRR, S1/S2 +, no rubs, no gallops Respiratory: CTA bilaterally, no wheezing, no rhonchi Abdominal: Soft, NT, ND, bowel sounds + Extremities: no edema, no cyanosis  Discharge Instructions  Discharge Instructions     [facility] pharmacist to adjust dose for Vancomycin, Aminoglycosides and other anti-infective therapies as requested by physician.   Complete by: As directed    [facility] to provide Cath Flo 2mg    Complete by: As directed    Administer for PICC line occlusion and as ordered by physician for other access device issues.   Anaphylaxis Kit: Provided to treat any anaphylactic reaction to the medication being provided to the patient if First Dose or when requested by physician   Complete by: As directed    Epinephrine 1mg /ml vial / amp: Administer 0.3mg  (0.60ml) subcutaneously once for moderate to severe anaphylaxis, nurse to call physician and pharmacy when reaction occurs and call 911 if needed for immediate care   Diphenhydramine 50mg /ml IV vial: Administer 25-50mg  IV/IM PRN for first dose reaction, rash, itching, mild reaction, nurse to call physician and pharmacy when reaction occurs   Sodium Chloride 0.9% NS IV: Administer if needed for hypovolemic blood pressure drop or as ordered by physician after call to physician with anaphylactic reaction   Call MD for:  difficulty breathing, headache or visual disturbances   Complete by: As directed    Call MD for:  extreme fatigue   Complete by: As directed    Call MD for:  persistant dizziness or light-headedness   Complete by: As directed    Call MD for:  persistant nausea and vomiting   Complete by: As directed    Call MD for:  severe uncontrolled pain   Complete by: As directed    Call MD for:  temperature >100.4   Complete by: As directed    Change  dressing on IV access line weekly and PRN   Complete by: As directed    Diet - low sodium heart healthy   Complete by: As directed    Discharge instructions   Complete by: As directed    1. You are being discharged with home health, who will help with antibiotic administration through the PICC line 2. You are also being discharged with home health physical and occupational therapy 3. PICC line care as directed 4. Continue Ceftriaxone daily for 6 total weeks (end date 4/19) and then transition to oral Cefadroxil for 2 more weeks, for a total of 8 weeks of antibiotics 5. Follow up with a dentist as soon as possible for dental extractions, so that the source of infection is contained while you are still taking antibiotics 6. Pacemaker has been removed; monitor heart rate and for symptoms associated with low heart rate (fatigue, shortness of breath) 7. May remove R groin bandage and shower in the evening on 3/14, but do not scrub 8. Follow up with cardiology and EP (electrophysiology) as scheduled 9. Follow up with infectious disease on 4/3 as scheduled 10. Follow up with Dr.  John in 1-2 weeks 11. Follow up with Dr. Dorris Fetch from CT surgery after dental extraction(s) and completion of antibiotics 12. Hold hydrochlorothiazide until PCP follow up   Flush IV access with Sodium Chloride 0.9% and Heparin 10 units/ml or 100 units/ml   Complete by: As directed    Home infusion instructions - [facility]   Complete by: As directed    Instructions: Flush IV access with Sodium Chloride 0.9% and Heparin 10units/ml or 100units/ml   Change dressing on IV access line: Weekly and PRN   Instructions Cath Flo 2mg : Administer for PICC Line occlusion and as ordered by physician for other access device   [facility] pharmacist to adjust dose for: Vancomycin, Aminoglycosides and other anti-infective therapies as requested by physician   Increase activity slowly   Complete by: As directed     Method of administration may be changed at the discretion of home infusion pharmacist based upon assessment of the patient and/or caregiver's ability to self-administer the medication ordered   Complete by: As directed    No wound care   Complete by: As directed       Allergies as of 06/30/2023   No Known Allergies      Medication List     PAUSE taking these medications    hydrochlorothiazide 12.5 MG capsule Wait to take this until your doctor or other care provider tells you to start again. Commonly known as: MICROZIDE Take 1 capsule (12.5 mg total) by mouth daily.       TAKE these medications    acetaminophen 325 MG tablet Commonly known as: TYLENOL Take 2 tablets (650 mg total) by mouth every 6 (six) hours as needed for mild pain (pain score 1-3) (or Fever >/= 101). What changed: how much to take   alfuzosin 10 MG 24 hr tablet Commonly known as: UROXATRAL Take 10 mg by mouth at bedtime.   amLODipine 10 MG tablet Commonly known as: NORVASC Take 1 tablet (10 mg total) by mouth daily.   aspirin EC 81 MG tablet Take 1 tablet (81 mg total) by mouth daily with breakfast.   B-D SINGLE USE SWABS REGULAR Pads Use as directed once per day E11.9   cefTRIAXone IVPB Commonly known as: ROCEPHIN Inject 2 g into the vein daily. Indication:  Vertebral osteomyelitis  First Dose: Yes Last Day of Therapy:  07/28/23  Labs - Once weekly:  CBC/D and BMP, Labs - Once weekly: ESR and CRP Method of administration: IV Push Method of administration may be changed at the discretion of home infusion pharmacist based upon assessment of the patient and/or caregiver's ability to self-administer the medication ordered.   irbesartan 300 MG tablet Commonly known as: AVAPRO Take 1 tablet (300 mg total) by mouth daily.   lidocaine 5 % Commonly known as: LIDODERM Place 2 patches onto the skin daily. Remove & Discard patch within 12 hours or as directed by MD   metFORMIN 500 MG 24 hr  tablet Commonly known as: GLUCOPHAGE-XR TAKE 1 TABLET EVERY DAY WITH BREAKFAST   methocarbamol 500 MG tablet Commonly known as: ROBAXIN Take 1 tablet (500 mg total) by mouth 3 (three) times daily.   mirtazapine 15 MG tablet Commonly known as: Remeron Take 1 tablet (15 mg total) by mouth at bedtime.   oxyCODONE 5 MG immediate release tablet Commonly known as: Oxy IR/ROXICODONE Take 1 tablet (5 mg total) by mouth every 4 (four) hours as needed for moderate pain (pain score 4-6). What changed:  when to take this reasons to take this   pantoprazole 40 MG tablet Commonly known as: Protonix Take 1 tablet (40 mg total) by mouth daily.   polyethylene glycol 17 g packet Commonly known as: MIRALAX / GLYCOLAX Take 17 g by mouth 2 (two) times daily.   rosuvastatin 20 MG tablet Commonly known as: CRESTOR TAKE 1 TABLET EVERY DAY   senna-docusate 8.6-50 MG tablet Commonly known as: Senokot-S Take 2 tablets by mouth at bedtime.   tolterodine 2 MG 24 hr capsule Commonly known as: DETROL LA Take 2 mg by mouth daily.   True Metrix Blood Glucose Test test strip Generic drug: glucose blood TEST BLOOD SUGAR TWO TIMES DAILY AS INSTRUCTED   True Metrix Level 1 Low Soln Use as directed once per day E11.9   True Metrix Meter Devi USE AS DIRECTED TO test blood sugar EVERY DAY   TRUEplus Lancets 33G Misc Use as directed once per day E11.9   Vitamin D3 Super Strength 50 MCG (2000 UT) tablet Generic drug: Cholecalciferol Take 2,000 Units by mouth daily.   zinc gluconate 50 MG tablet Take 50 mg by mouth daily.               Discharge Care Instructions  (From admission, onward)           Start     Ordered   06/21/23 0000  Change dressing on IV access line weekly and PRN  (Home infusion instructions - [facility] )        06/21/23 1428            [facility].   Why: [facility] will call you to resume  their home health services after you discharge to home Contact information: [address] [place] [address] [phone]         [name], MD Follow up in 1 week(s).   Specialties: Internal Medicine, Radiology Contact information: [address] [place] [address] [phone]         [facility] Follow up.   Why: [facility] will follo up with home antibiotics Contact information: [phone]               No Known Allergies  The results of significant diagnostics from this hospitalization (including imaging, microbiology, ancillary and laboratory) are listed below for reference.    Microbiology: Recent Results (from the past 240 hours)  Surgical PCR screen     Status: None   Collection Time: 06/20/23  5:18 PM   Specimen: Nasal Mucosa; Nasal Swab  Result Value Ref Range Status   MRSA, PCR NEGATIVE NEGATIVE Final   Staphylococcus aureus NEGATIVE NEGATIVE Final    Comment: (NOTE) The Xpert SA Assay (FDA approved for NASAL specimens in patients 62 years of age and older), is one component of a comprehensive surveillance program. It is not intended to diagnose infection nor to guide or monitor treatment. Performed at [facility], [address], [place], [place] [address]     Procedures/Studies: DG Chest 2 View Result Date: 06/22/2023 CLINICAL DATA:  Status post removal of defibrillator. Shortness of breath. EXAM: CHEST - 2 VIEW COMPARISON:  June 14, 2023. FINDINGS: The heart size and mediastinal contours are within normal limits. Left-sided defibrillator noted on prior exam has been removed. Both lungs are clear. The visualized skeletal structures are unremarkable. IMPRESSION: No active cardiopulmonary disease. Electronically Signed  By: Lupita Raider M.D.   On: 06/22/2023 10:34   EP PPM/ICD IMPLANT Result Date: 06/21/2023 CONCLUSIONS:  1.  Symptomatic bradycardia with a history of dual-chamber permanent  pacemaker.  Admission for complicated bacteremia.  2.  Successful extraction of a dual-chamber permanent pacemaker  3.  Successful capsulectomy  4.  Transesophageal echocardiogram showing evidence of mitral valve vegetation and associated severe mitral valve regurgitation.  Baseline small pericardial effusion unchanged after lead extraction.  5.  No early apparent complications.   [place] EKG SITE RITE Result Date: 06/21/2023 If Site Rite image not attached, placement could not be confirmed due to current cardiac rhythm.  ECHO TEE Result Date: 06/18/2023    TRANSESOPHOGEAL ECHO REPORT   Patient Name:   Grant Bowers Date of Exam: 06/18/2023 Medical Rec #:  [id number]     Height:       72.0 in Accession #:    [id number]    Weight:       177.2 lb Date of Birth:  Jun 03, 1945    BSA:          2.024 m Patient Age:    12 years      BP:           148/81 mmHg Patient Gender: M             HR:           97 bpm. Exam Location:  Inpatient Procedure: Transesophageal Echo, 3D Echo, Cardiac Doppler and Color Doppler            (Both Spectral and Color Flow Doppler were utilized during            procedure). Indications:     Bacteremia  History:         Patient has prior history of Echocardiogram examinations, most                  recent 06/17/2023. Risk Factors:Hypertension, Diabetes and Sleep                  Apnea.  Sonographer:     Darlys Gales Referring Phys:  [id number] HAO MENG Diagnosing Phys: Thurmon Fair MD PROCEDURE: After discussion of the risks and benefits of a TEE, an informed consent was obtained from a family member. The transesophogeal probe was passed without difficulty through the esophogus of the patient. Sedation performed by different physician. The patient was monitored while under deep sedation. Anesthestetic sedation was provided intravenously by Anesthesiology: 155mg  of Propofol. The patient developed no complications during the procedure.  IMPRESSIONS  1. Left ventricular ejection fraction, by estimation,  is 55 to 60%. The left ventricle has low normal function.  2. Right ventricular systolic function is normal. The right ventricular size is normal. Tricuspid regurgitation signal is inadequate for assessing PA pressure.  3. Left atrial size was moderately dilated. No left atrial/left atrial appendage thrombus was detected. The LAA emptying velocity was 60 cm/s.  4. No clear large vegetation is seen. At the level of the medial portion of the anterior leaflet (A3) there is a segment of flail leaflet that is slightly thickened (maximum 4 mm thickness). This may be a sessile vegetation. There is severe MR: vena contracta 5 mm, effective regurgitant orifice area 0.63 cm sq, regurgitant volume 86 ml, regurgitant fraction 60%. The mitral valve is abnormal. Severe mitral valve regurgitation. No evidence of mitral stenosis.  5. The aortic valve is tricuspid. Aortic valve regurgitation is not visualized. No aortic stenosis  is present.  6. There is Moderate (Grade III) plaque involving the descending aorta.  7. 3D performed of the mitral valve and demonstrates Small vegetation versus flail segment of A3 scallop of the mitral valve. Comparison(s): Mitral insufficiency has been present on transthoracic echo at least since 2022 and has probably been underestimated due to its eccentric jet. There is no previous TEE for comparison. Conclusion(s)/Recommendation(s): Findings are concerning for vegetation/infective endocarditis as detailed above. FINDINGS  Left Ventricle: Left ventricular ejection fraction, by estimation, is 55 to 60%. The left ventricle has low normal function. The left ventricular internal cavity size was normal in size. There is no left ventricular hypertrophy. Right Ventricle: The right ventricular size is normal. No increase in right ventricular wall thickness. Right ventricular systolic function is normal. Tricuspid regurgitation signal is inadequate for assessing PA pressure. Left Atrium: Left atrial size was  moderately dilated. No left atrial/left atrial appendage thrombus was detected. The LAA emptying velocity was 60 cm/s. Right Atrium: Right atrial size was normal in size. Pericardium: Trivial pericardial effusion is present. The pericardial effusion is circumferential. Mitral Valve: No clear large vegetation is seen. At the level of the medial portion of the anterior leaflet (A3) there is a segment of flail leaflet that is slightly thickened (maximum 4 mm thickness). This may be a sessile vegetation. There is severe MR: vena contracta 5 mm, effective regurgitant orifice area 0.63 cm sq, regurgitant volume 86 ml, regurgitant fraction 60%. The mitral valve is abnormal. Severe mitral valve regurgitation, with eccentric posteriorly directed jet. No evidence of mitral valve stenosis. The mean mitral valve gradient is 2.1 mmHg. Tricuspid Valve: The tricuspid valve is normal in structure. Tricuspid valve regurgitation is trivial. Aortic Valve: The aortic valve is tricuspid. Aortic valve regurgitation is not visualized. No aortic stenosis is present. Pulmonic Valve: The pulmonic valve was normal in structure. Pulmonic valve regurgitation is not visualized. No evidence of pulmonic stenosis. Aorta: The aortic root, ascending aorta, aortic arch and descending aorta are all structurally normal, with no evidence of dilitation or obstruction. There is moderate (Grade III) plaque involving the descending aorta. Venous: A systolic blunting flow pattern is recorded from the left upper pulmonary vein and the right upper pulmonary vein. IAS/Shunts: No atrial level shunt detected by color flow Doppler. Additional Comments: 3D was performed not requiring image post processing on an independent workstation and was abnormal. A device lead is visualized in the right ventricle, right atrium and superior vena cava. LEFT VENTRICLE PLAX 2D LVOT diam:     2.24 cm LV SV:         57 LV SV Index:   28 LVOT Area:     3.96 cm  AORTIC VALVE LVOT  Vmax:   74.20 cm/s LVOT Vmean:  59.200 cm/s LVOT VTI:    0.145 m MITRAL VALVE MV Mean grad: 2.1 mmHg SHUNTS                        Systemic VTI:  0.14 m                        Systemic Diam: 2.24 cm Thurmon Fair MD Electronically signed by Thurmon Fair MD Signature Date/Time: 06/18/2023/12:06:06 PM    Final    EP STUDY Result Date: 06/18/2023 See surgical note for result.  ECHOCARDIOGRAM LIMITED Result Date: 06/17/2023    ECHOCARDIOGRAM LIMITED REPORT   Patient Name:   Grant Bowers Date of Exam: 06/17/2023  Medical Rec #:  [id number]     Height:       72.0 in Accession #:    [id number]    Weight:       177.2 lb Date of Birth:  Dec 26, 1945    BSA:          2.024 m Patient Age:    77 years      BP:           132/64 mmHg Patient Gender: M             HR:           110 bpm. Exam Location:  Inpatient Procedure: Limited Echo, Cardiac Doppler and Color Doppler (Both Spectral and            Color Flow Doppler were utilized during procedure). Indications:    endocarditis  History:        Patient has prior history of Echocardiogram examinations, most                 recent 05/14/2023. Pacemaker, chronic kidney disease; Risk                 Factors:Hypertension, Dyslipidemia, Diabetes and Sleep Apnea.  Sonographer:    Delcie Roch RDCS Referring Phys: [id number] [facility] IMPRESSIONS  1. Left ventricular ejection fraction, by estimation, is 55 to 60%. The left ventricle has normal function.  2. Right ventricular systolic function is normal. The right ventricular size is normal.  3. A small pericardial effusion is present. The pericardial effusion is circumferential.  4. Mild mitral valve regurgitation. No evidence of mitral stenosis.  5. The aortic valve is tricuspid. There is mild calcification of the aortic valve. There is mild thickening of the aortic valve.  6. The inferior vena cava is normal in size with greater than 50% respiratory variability, suggesting right atrial pressure of 3 mmHg.  7. Limited echo  FINDINGS  Left Ventricle: Left ventricular ejection fraction, by estimation, is 55 to 60%. The left ventricle has normal function. Right Ventricle: The right ventricular size is normal. Right vetricular wall thickness was not well visualized. Right ventricular systolic function is normal. Pericardium: A small pericardial effusion is present. The pericardial effusion is circumferential. Mitral Valve: Mild mitral valve regurgitation. No evidence of mitral valve stenosis. Tricuspid Valve: The tricuspid valve is normal in structure. Tricuspid valve regurgitation is not demonstrated. No evidence of tricuspid stenosis. Aortic Valve: The aortic valve is tricuspid. There is mild calcification of the aortic valve. There is mild thickening of the aortic valve. There is mild aortic valve annular calcification. Venous: The inferior vena cava is normal in size with greater than 50% respiratory variability, suggesting right atrial pressure of 3 mmHg. Additional Comments: A device lead is visualized in the right atrium and right ventricle. Spectral Doppler performed. Color Doppler performed.  IVC IVC diam: 1.70 cm MR Peak grad: 119.7 mmHg MR Mean grad: 71.0 mmHg MR Vmax:      547.00 cm/s MR Vmean:     397.0 cm/s Dina Rich MD Electronically signed by Dina Rich MD Signature Date/Time: 06/17/2023/7:39:57 PM    Final    DG Orthopantogram Result Date: 06/17/2023 CLINICAL DATA:  [id number]. Abscess. EXAM: ORTHOPANTOGRAM/PANORAMIC COMPARISON:  None. FINDINGS: A single view is provided. There are multiple upper and lower metallic crowns and fillings. There is a metallic crown in the expected location of the right upper front lateral incisor but the root structure is not well seen. This could be on  a technical basis, motion artifact or carious involvement of the root structure. There appears to have been some degree of motion at the level of the upper and lower incisors. The 12 year and 6 year molars of the right hemimandible  demonstrate severe decay with only the roots remaining of the 6 year molar. The 12 year molar still has a portion of the crown intact but most of it is decayed. Bone recession has occurred along the alveolar process adjacent the right mandibular 6 and 12 year molars and is also somewhat noted along the mandibular incisors and bicuspids. There is slight periapical lucency noted underlying the roots of the right mandibular decayed molars and also underlying the right mandibular first bicuspid and the left mandibular second bicuspid. The left mandibular 6 year molar has been previously removed with medial tilting of the left mandibular 12 year molar. The upper teeth are not quite as well seen due to motion artifacts but except as above they appear grossly intact without obvious periapical lucency. Both TMJs are normally located. IMPRESSION: 1. Multiple upper and lower metallic crowns and fillings. 2. The root structure of the right upper front lateral incisor is not well seen. This could be on a technical basis, motion artifact or carious involvement of the root structure. 3. Severe decay of the right mandibular 6 year and 12 year molars with bone erosion along the alveolar process adjacent. 4. Slight periapical lucency underlying the roots of the right mandibular 6 year molar and also underlying the right mandibular first bicuspid and the left mandibular second bicuspid. 5. The left mandibular 6 year molar has been previously removed with medial tilting of the left mandibular 12 year molar. 6. The upper teeth are not as well seen due to motion, but except as above appear grossly intact. Electronically Signed   By: Almira Bar M.D.   On: 06/17/2023 03:20   DG HIP UNILAT WITH PELVIS 2-3 VIEWS RIGHT Result Date: 06/16/2023 CLINICAL DATA:  [id number].  Infection. Right hip pain. EXAM: DG HIP (WITH OR WITHOUT PELVIS) 2-3V RIGHT COMPARISON:  AP pelvis and right hip views 06/04/2023 FINDINGS: There is no evidence of hip  fracture or dislocation. There is no evidence of arthropathy or other focal bone abnormality. There are patchy calcific plaques in the right superficial femoral artery with otherwise unremarkable soft tissues. Compare: Unchanged. IMPRESSION: 1. No evidence of fracture or dislocation. 2. Atherosclerosis. Electronically Signed   By: Almira Bar M.D.   On: 06/16/2023 22:06   CT ABDOMEN PELVIS W CONTRAST Result Date: 06/14/2023 CLINICAL DATA:  Acute generalized abdominal pain. EXAM: CT ABDOMEN AND PELVIS WITH CONTRAST TECHNIQUE: Multidetector CT imaging of the abdomen and pelvis was performed using the standard protocol following bolus administration of intravenous contrast. RADIATION DOSE REDUCTION: This exam was performed according to the departmental dose-optimization program which includes automated exposure control, adjustment of the mA and/or kV according to patient size and/or use of iterative reconstruction technique. CONTRAST:  80mL OMNIPAQUE IOHEXOL 300 MG/ML  SOLN COMPARISON:  June 03, 2023.  December 15, 2021. FINDINGS: Lower chest: Small pericardial effusion is noted. Minimal bilateral posterior basilar subsegmental atelectasis. Hepatobiliary: Small gallstone is noted with mild gallbladder distension. No biliary dilatation. Probable hepatic cysts are noted. Pancreas: Unremarkable. No pancreatic ductal dilatation or surrounding inflammatory changes. Spleen: Mild splenomegaly. Adrenals/Urinary Tract: Adrenal glands appear normal. Bilateral renal cysts are noted for which no further follow-up is required. No hydronephrosis or renal obstruction is noted. Urinary bladder is unremarkable. Stomach/Bowel: Stomach  is within normal limits. Appendix appears normal. No evidence of bowel wall thickening, distention, or inflammatory changes. Vascular/Lymphatic: Aortic atherosclerosis. No enlarged abdominal or pelvic lymph nodes. Reproductive: Mild prostatic enlargement is again noted. Other: Small bilateral fat  containing inguinal hernias are noted. No ascites is noted. Musculoskeletal: There appears to be irregular defect involving the posterior portion of the L4 vertebral body. IMPRESSION: Irregular defect is seen involving posterior portion of L4 vertebral body. Osteomyelitis cannot be excluded. MRI with and without gadolinium is recommended for further evaluation. Small pericardial effusion. Small gallstone with gallbladder distention. Mild splenomegaly. Mild prostatic enlargement. Aortic Atherosclerosis (ICD10-I70.0). Electronically Signed   By: Lupita Raider M.D.   On: 06/14/2023 20:57   DG Chest Portable 1 View Result Date: 06/14/2023 CLINICAL DATA:  Fever. EXAM: PORTABLE CHEST 1 VIEW COMPARISON:  April 26, 2023 FINDINGS: There is stable dual lead AICD positioning. The heart size and mediastinal contours are within normal limits. Both lungs are clear. Multilevel degenerative changes seen throughout the thoracic spine. IMPRESSION: No active disease. Electronically Signed   By: Aram Candela M.D.   On: 06/14/2023 20:26   US Carotid Bilateral Result Date: 06/05/2023 CLINICAL DATA:  Possible syncope.  Right-sided bruit. EXAM: BILATERAL CAROTID DUPLEX ULTRASOUND TECHNIQUE: Wallace Cullens scale imaging, color Doppler and duplex ultrasound were performed of bilateral carotid and vertebral arteries in the neck. COMPARISON:  None Available. FINDINGS: Criteria: Quantification of carotid stenosis is based on velocity parameters that correlate the residual internal carotid diameter with NASCET-based stenosis levels, using the diameter of the distal internal carotid lumen as the denominator for stenosis measurement. The following velocity measurements were obtained: RIGHT ICA: 164/16 cm/sec CCA: 74/11 cm/sec SYSTOLIC ICA/CCA RATIO:  2.9 ECA: 249 cm/sec LEFT ICA: 103/29 cm/sec CCA: 85/14 cm/sec SYSTOLIC ICA/CCA RATIO:  1.2 ECA: 161 cm/sec RIGHT CAROTID ARTERY: Echogenic plaque at the right carotid bulb. Elevated peak systolic  velocity in the right external carotid artery. Echogenic plaque in the proximal internal carotid artery. Peak systolic velocity in the proximal internal carotid artery is 164 cm/sec. RIGHT VERTEBRAL ARTERY: Antegrade flow and normal waveform in the right vertebral artery. LEFT CAROTID ARTERY: Intimal thickening in the left common carotid artery. Small amount of echogenic plaque at the left carotid bulb. External carotid artery is patent with normal waveform. Normal velocities in the internal carotid artery. LEFT VERTEBRAL ARTERY: Antegrade flow and normal waveform in the left vertebral artery. IMPRESSION: 1. Atherosclerotic plaque at the right carotid bulb and right internal carotid artery with elevated peak systolic velocity in the proximal right internal carotid artery. 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 2. Mild atherosclerotic plaque in the left carotid arteries. Findings are compatible with less than 50% stenosis in the left internal carotid artery. 3. Antegrade flow in the vertebral arteries. 4. Probable stenosis in the right external carotid artery. Electronically Signed   By: Richarda Overlie M.D.   On: 06/05/2023 10:59   DG HIP UNILAT W OR W/O PELVIS 2-3 VIEWS RIGHT Result Date: 06/04/2023 CLINICAL DATA:  Right hip pain, fall 2 weeks ago EXAM: DG HIP (WITH OR WITHOUT PELVIS) 2-3V RIGHT COMPARISON:  None Available. FINDINGS: There is no evidence of hip fracture or dislocation. There is no evidence of arthropathy or other focal bone abnormality. IMPRESSION: Negative. Electronically Signed   By: Charlett Nose M.D.   On: 06/04/2023 20:01   US Abdomen Limited Result Date: 06/04/2023 CLINICAL DATA:  Abdominal pain EXAM: ULTRASOUND ABDOMEN LIMITED RIGHT UPPER QUADRANT  COMPARISON:  CT 06/03/2023. FINDINGS: Gallbladder: Dilated gallbladder. Dependent stones. Is also some non dependent echogenic areas which could resent small polyps measuring up to 4 mm. No adjacent fluid  or wall thickening. No reported sonographic Murphy's sign. Common bile duct: Diameter: 4 mm Liver: No focal lesion identified. Within normal limits in parenchymal echogenicity. Portal vein is patent on color Doppler imaging with normal direction of blood flow towards the liver. Other: Trace right pleural fluid. Small right-sided renal cystic focus identified as seen on prior CT. IMPRESSION: Distended gallbladder with stones and small polyps. No wall thickening, adjacent fluid or ductal dilatation. No additional sonographic evidence of acute cholecystitis. Trace right pleural fluid. Please correlate with prior CT Electronically Signed   By: Karen Kays M.D.   On: 06/04/2023 12:34   CT Angio Chest/Abd/Pel for Dissection W and/or Wo Contrast Result Date: 06/03/2023 CLINICAL DATA:  Head and neck trauma, low back pain and multiple frequent falls. Evaluating for acute aortic syndrome. EXAM: CT ANGIOGRAPHY CHEST, ABDOMEN AND PELVIS TECHNIQUE: A noncontrast chest CT study was initially performed to look for aortic hematoma. Multidetector CT imaging through the chest, abdomen and pelvis was performed using the standard protocol during bolus administration of intravenous contrast. Multiplanar reconstructed images and MIPs were obtained and reviewed to evaluate the vascular anatomy. RADIATION DOSE REDUCTION: This exam was performed according to the departmental dose-optimization program which includes automated exposure control, adjustment of the mA and/or kV according to patient size and/or use of iterative reconstruction technique. CONTRAST:  80mL OMNIPAQUE IOHEXOL 350 MG/ML SOLN COMPARISON:  CTA chest and PA and lateral chest both 04/26/2023, CT abdomen pelvis without and with contrast 12/16/2018. FINDINGS: CTA CHEST FINDINGS Cardiovascular: There is metallic artifact from a left chest dual lead pacing system and wires in the right heart. The pulmonary arteries are normal in caliber, centrally clear on this nondedicated  exam. There is preferential opacification of the aorta and great vessels. There is no aneurysm, dissection or stenosis. There are moderate to heavy aortic calcific plaques, occasional calcifications in the great vessels. There is a normal variant brachiobicarotid trunk and normal variant origin of the left vertebral artery from the aortic arch. The cardiac size is normal. There is interval increased pericardial effusion now 1 cm in thickness, previously 7 mm. Scattered calcific plaque left main, lad and circumflex coronary arteries. No venous dilatation. Mediastinum/Nodes: Few slightly frontal bilateral hilar nodes up to 1.1 cm in short axis. No mediastinal or axillary adenopathy. Thyroid gland is unremarkable. The thoracic trachea, thoracic esophagus, both main bronchi unremarkable. Lungs/Pleura: There are bilateral minimal layering pleural effusions, both slightly increased since 04/26/2023. Again noted linear scarring or atelectasis in both lung bases. Mild fissural atelectasis in the left upper lobe. There is no consolidation or pneumothorax. No pulmonary nodules are seen. Musculoskeletal: Extensive bridging enthesopathy of the thoracic spine beginning at T4. Degenerative change and mild thoracic dextroscoliosis. No acute or other significant osseous findings.  No chest wall mass. Review of the MIP images confirms the above findings. CTA ABDOMEN AND PELVIS FINDINGS VASCULAR Aorta: Moderate to heavy aortoiliac calcific plaques. No stenosis. 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 Celiac: Patent without evidence of aneurysm, dissection, vasculitis or significant stenosis. Scattered branch calcific plaques splenic artery. No branch occlusions. Nonstenosing ostial calcific plaques. SMA: 50-60% calcific origin stenosis. The vessel otherwise opacifies well without visible branch occlusion. Minimal calcific plaque past the inflection point of the vessel. Renals: 2  roughly codominant arteries supplying each  kidney. Both upper pole arteries demonstrate approximately 50% calcific origin stenosis. The right lower pole artery appears to have at least a 75 % calcific origin stenosis. The left lower pole artery is patent. IMA: Patent without evidence of aneurysm, dissection, vasculitis or significant stenosis. Inflow: Patent without evidence of aneurysm, dissection, vasculitis or significant stenosis. There are mild-to-moderate calcific plaques. Veins: No obvious venous abnormality within the limitations of this arterial phase study. Review of the MIP images confirms the above findings. NON-VASCULAR Hepatobiliary: Small scattered hepatic cysts and a few too small to characterize hepatic hypodensities, unchanged. No follow-up imaging recommended. Largest cyst is 1.7 cm and 11 Hounsfield units in the dome of segment 8. Gallbladder dilated to nearly 11 cm. There is a subcentimeter stone in the proximal lumen. On some of the axial images there is suspicion of pericholecystic edema and slight gallbladder wall thickening which may be seen with cholecystitis. Consider ultrasound follow-up for further study. No biliary dilatation. Pancreas: No abnormality. Spleen: Increased splenomegaly, the current AP splenic axis 17 cm previously 15.5 cm. No mass. Adrenals/Urinary Tract: There is no adrenal mass. Bilateral Bosniak 1 renal cysts are again seen, largest is a right parapelvic cyst measuring 4.6 cm and 15 Hounsfield units. No follow-up imaging is recommended. There is cortical thinning in both kidneys, scattered cortical scarring of the inferior poles. There previously was a 6 mm right ureteral stone which has passed. There is no hydronephrosis or appreciable urinary stones at present. No mass enhancement. The bladder is unremarkable. Stomach/Bowel: No dilatation or wall thickening including the appendix. Moderate fecal stasis. Sigmoid diverticulosis with no evidence of diverticulitis.  Lymphatic: No appreciable adenopathy. Reproductive: 5.5 cm enlarged prostate. Scattered dystrophic prostatic calcifications. There is a mild posterior bladder impression. Right testicle appears to be retracted into the inguinal canal. Other: Small umbilical and inguinal fat hernias. No free hemorrhage, free fluid or free air, or localizing collections. Musculoskeletal: Degenerative change and slight levoscoliosis lumbar spine. Ankylosed SI joints. Mild hip DJD. No concerning regional bone lesion. Lumbar spine CT is dictated separately. Review of the MIP images confirms the above findings. IMPRESSION: 1. No acute trauma related findings in the chest, abdomen or pelvis. 2. Aortoiliac and coronary artery atherosclerosis. No dissection or aortic stenosis. 3. 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 Recommend surveillance ultrasound in 5 years. Reference: Journal of Vascular Surgery 67.1 (2018): 2-77. J Am Coll Radiol 2013;10:789-794. 4. 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 5. 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 6. Dilated gallbladder with stone and possible pericholecystic edema and slight wall thickening. Consider ultrasound follow-up to assess for cholecystitis. 7. Increased splenomegaly. 8. Constipation and diverticulosis. 9. Prostatomegaly with mild posterior bladder impression. Right testicle appears retracted into the inguinal canal. 10. Umbilical and inguinal fat hernias. 11. Increased pericardial effusion now 1 cm in thickness, previously 7 mm. 12. Slightly increased bilateral pleural effusions. 13. Critical Value/emergent results were called by telephone at the time of interpretation on 06/03/2023 at 11:29 pm to provider JOSEPH ZAMMIT , who verbally acknowledged these results. Aortic Atherosclerosis (ICD10-I70.0). Electronically Signed   By: Almira Bar M.D.   On: 06/03/2023 23:47   CT L-SPINE NO CHARGE Result Date:  06/03/2023 CLINICAL DATA:  Frequent falls, worsening back pain. EXAM: CT LUMBAR SPINE WITHOUT CONTRAST TECHNIQUE: Multidetector CT imaging of the lumbar spine was performed without intravenous contrast administration. Multiplanar CT image reconstructions were also generated. RADIATION DOSE REDUCTION: This exam was performed according to  the departmental dose-optimization program which includes automated exposure control, adjustment of the mA and/or kV according to patient size and/or use of iterative reconstruction technique. COMPARISON:  CT abdomen and pelvis without and with contrast 12/15/2021 FINDINGS: Segmentation: 5 lumbar type vertebrae. Alignment: Chronic minimal discogenic degenerative grade 1 retrolisthesis L3-4, L4-5 and L5-S1. Slight levoscoliosis. No new, worsening or further alignment abnormality is seen. Vertebrae: Due to technique, the images are grainy with suboptimal spatial resolution. Accounting for this limitation no acute fracture is suspected or focal pathologic process. The vertebra are normal in heights. There are prominent marginal osteophytes, with attempted bridging anteriorly at L2-3 and to the right anteriorly at L4-5 and L5-S1. Paraspinal and other soft tissues: Heavy aortoiliac calcific plaques. 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 Multiple partially visible Bosniak 1 right renal cysts. No follow-up imaging recommended. No paraspinal hematoma, mass or fluid collection. Disc levels: T11-12: The disc is degenerated. Small anterior osteophytes. No herniation or stenosis. T12-L1: The disc is normal in height. There is no bulge, herniation or stenosis. Trace facet spurring. L1-2: The disc is normal in height. There is a minimal nonstenosing dorsal disc bulge without herniation. The foramina are clear. Trace facet spurring. L2-3: The disc is normal in height. There is a mild diffuse annular bulge without herniation, spinal canal or foraminal stenosis. Mild facet joint spurring. L3-4:  Mild-to-moderate disc space loss. Anterior and posterior osteophytes are present. Broad-based posterior disc osteophyte complex mildly encroaches on the lateral recesses and descending L4 nerve roots. There is only slight spinal canal stenosis. Mild facet spurring with unilateral mild right foraminal stenosis. L4-5: Mild disc space loss. There is circumferential disc osteophyte complex. Right paracentral shallow disc protrusion is seen with likely mass effect on the descending right L5 nerve root. There is mild-to-moderate spinal canal stenosis with hypertrophic dorsal ligamentous calcification contributing. Facet spurring with bilateral mild foraminal stenosis. L5-S1: Mild disc space loss. Partial ossification across the posterior disc space. Posterior endplate osteophytes with posterior disc osteophyte complex again noted. Shallow central disc protrusion is seen, with disc osteophyte complex and protrusion causing mild mass effect on the S1 nerve roots. No significant foraminal narrowing and only mild facet spurring. 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 The visualized sacrum is intact. Unremarkable visualized posterior pelvis. IMPRESSION: 1. Suboptimal image quality, with no convincing acute lumbar fracture or focal pathologic process. 2. Scoliosis and degenerative change with multilevel disc osteophyte complexes, and protrusions at the lowest 2 levels. 3. Right paracentral shallow disc protrusion at L4-5 with likely mass effect on the descending right L5 nerve root. 4. Shallow central disc protrusion at L5-S1 with disc osteophyte complex and protrusion causing mild mass effect on the S1 nerve roots. 5. Multilevel facet spurring with mild foraminal stenosis. 6. Aortoiliac atherosclerosis. 7. 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 Recommend follow-up ultrasound every 5 years. Reference: Journal of Vascular Surgery 67.1 (2018): 2-77. J Am Coll Radiol [phone]. Aortic Atherosclerosis  (ICD10-I70.0). Electronically Signed   By: Almira Bar M.D.   On: 06/03/2023 23:03   CT Head Wo Contrast Result Date: 06/03/2023 CLINICAL DATA:  Falls EXAM: CT HEAD WITHOUT CONTRAST CT CERVICAL SPINE WITHOUT CONTRAST TECHNIQUE: Multidetector CT imaging of the head and cervical spine was performed following the standard protocol without intravenous contrast. Multiplanar CT image reconstructions of the cervical spine were also generated. RADIATION DOSE REDUCTION: This exam was performed according to the departmental dose-optimization program which includes automated exposure control, adjustment of the mA and/or kV according to patient size  and/or use of iterative reconstruction technique. COMPARISON:  None Available. FINDINGS: CT HEAD FINDINGS Brain: No mass,hemorrhage or extra-axial collection. Normal appearance of the parenchyma and CSF spaces. Vascular: No hyperdense vessel or unexpected vascular calcification. Skull: The visualized skull base, calvarium and extracranial soft tissues are normal. Sinuses/Orbits: No fluid levels or advanced mucosal thickening of the visualized paranasal sinuses. No mastoid or middle ear effusion. Normal orbits. Other: None. CT CERVICAL SPINE FINDINGS Alignment: No static subluxation. Facets are aligned. Occipital condyles are normally positioned. Skull base and vertebrae: No acute fracture. Soft tissues and spinal canal: No prevertebral fluid or swelling. No visible canal hematoma. Disc levels: No advanced spinal canal or neural foraminal stenosis. Upper chest: No pneumothorax, pulmonary nodule or pleural effusion. Other: Normal visualized paraspinal cervical soft tissues. IMPRESSION: 1. No acute intracranial abnormality. 2. No acute fracture or static subluxation of the cervical spine. Electronically Signed   By: Deatra Robinson M.D.   On: 06/03/2023 22:45   CT Cervical Spine Wo Contrast Result Date: 06/03/2023 CLINICAL DATA:  Falls EXAM: CT HEAD WITHOUT CONTRAST CT CERVICAL  SPINE WITHOUT CONTRAST TECHNIQUE: Multidetector CT imaging of the head and cervical spine was performed following the standard protocol without intravenous contrast. Multiplanar CT image reconstructions of the cervical spine were also generated. RADIATION DOSE REDUCTION: This exam was performed according to the departmental dose-optimization program which includes automated exposure control, adjustment of the mA and/or kV according to patient size and/or use of iterative reconstruction technique. COMPARISON:  None Available. FINDINGS: CT HEAD FINDINGS Brain: No mass,hemorrhage or extra-axial collection. Normal appearance of the parenchyma and CSF spaces. Vascular: No hyperdense vessel or unexpected vascular calcification. Skull: The visualized skull base, calvarium and extracranial soft tissues are normal. Sinuses/Orbits: No fluid levels or advanced mucosal thickening of the visualized paranasal sinuses. No mastoid or middle ear effusion. Normal orbits. Other: None. CT CERVICAL SPINE FINDINGS Alignment: No static subluxation. Facets are aligned. Occipital condyles are normally positioned. Skull base and vertebrae: No acute fracture. Soft tissues and spinal canal: No prevertebral fluid or swelling. No visible canal hematoma. Disc levels: No advanced spinal canal or neural foraminal stenosis. Upper chest: No pneumothorax, pulmonary nodule or pleural effusion. Other: Normal visualized paraspinal cervical soft tissues. IMPRESSION: 1. No acute intracranial abnormality. 2. No acute fracture or static subluxation of the cervical spine. Electronically Signed   By: Deatra Robinson M.D.   On: 06/03/2023 22:45    Labs: BNP (last 3 results) Recent Labs    06/19/23 0436 06/20/23 0443 06/21/23 0508  BNP 173.7* 349.9* 323.5*   Basic Metabolic Panel: Recent Labs  Lab 06/25/23 0255 06/27/23 0351  NA 136 135  K 4.0 4.0  CL 106 106  CO2 23 22  GLUCOSE 168* 146*  BUN 15 24*  CREATININE 1.10 1.03  CALCIUM 8.2* 8.4*    Liver Function Tests: No results for input(s): "AST", "ALT", "ALKPHOS", "BILITOT", "PROT", "ALBUMIN" in the last 168 hours. No results for input(s): "LIPASE", "AMYLASE" in the last 168 hours. No results for input(s): "AMMONIA" in the last 168 hours. CBC: Recent Labs  Lab 06/25/23 0255 06/27/23 0351  WBC 8.3 7.1  NEUTROABS 5.9 5.0  HGB 9.1* 8.6*  HCT 28.7* 26.9*  MCV 84.2 84.1  PLT 278 240   Cardiac Enzymes: No results for input(s): "CKTOTAL", "CKMB", "CKMBINDEX", "TROPONINI" in the last 168 hours. BNP: Invalid input(s): "POCBNP" CBG: Recent Labs  Lab 06/28/23 2117 06/29/23 0748 06/29/23 1157 06/29/23 2139 06/30/23 0810  GLUCAP 120* 129* 140* 164* 143*  D-Dimer No results for input(s): "DDIMER" in the last 72 hours. Hgb A1c No results for input(s): "HGBA1C" in the last 72 hours. Lipid Profile No results for input(s): "CHOL", "HDL", "LDLCALC", "TRIG", "CHOLHDL", "LDLDIRECT" in the last 72 hours. Thyroid function studies No results for input(s): "TSH", "T4TOTAL", "T3FREE", "THYROIDAB" in the last 72 hours.  Invalid input(s): "FREET3" Anemia work up No results for input(s): "VITAMINB12", "FOLATE", "FERRITIN", "TIBC", "IRON", "RETICCTPCT" in the last 72 hours. Urinalysis    Component Value Date/Time   COLORURINE YELLOW 06/14/2023 1943   APPEARANCEUR CLEAR 06/14/2023 1943   LABSPEC 1.044 (H) 06/14/2023 1943   PHURINE 6.0 06/14/2023 1943   GLUCOSEU NEGATIVE 06/14/2023 1943   GLUCOSEU NEGATIVE 04/17/2023 1353   HGBUR NEGATIVE 06/14/2023 1943   BILIRUBINUR NEGATIVE 06/14/2023 1943   KETONESUR NEGATIVE 06/14/2023 1943   PROTEINUR 100 (A) 06/14/2023 1943   UROBILINOGEN 1.0 04/17/2023 1353   NITRITE NEGATIVE 06/14/2023 1943   LEUKOCYTESUR NEGATIVE 06/14/2023 1943   Sepsis Labs Recent Labs  Lab 06/25/23 0255 06/27/23 0351  WBC 8.3 7.1   Microbiology Recent Results (from the past 240 hours)  Surgical PCR screen     Status: None   Collection Time: 06/20/23   5:18 PM   Specimen: Nasal Mucosa; Nasal Swab  Result Value Ref Range Status   MRSA, PCR NEGATIVE NEGATIVE Final   Staphylococcus aureus NEGATIVE NEGATIVE Final    Comment: (NOTE) The Xpert SA Assay (FDA approved for NASAL specimens in patients 4 years of age and older), is one component of a comprehensive surveillance program. It is not intended to diagnose infection nor to guide or monitor treatment. Performed at [facility], [address], [place], [place] [address]    Time coordinating discharge:  25 minutes  SIGNED: Lanae Boast, MD  [facility] 06/30/2023, 10:20 AM  If 7PM-7AM, please contact night-coverage [email or web address]

## 2023-06-29 NOTE — Progress Notes (Signed)
 Physical Therapy Treatment Patient Details Name: Grant Bowers MRN: [id number] DOB: [birth year] Today's Date: 06/29/2023   History of Present Illness Pt is 78 yo presenting to [facility] ED on 3/6 due to increased pain in LB with concerns for discitis. 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 TEE 3/10. PPM removed 3/14. PMH: DM II, HTN, mixed hyperlipidemia, LBP, trifascicular block s/p PPM, OSA on CPAP, GERD.    PT Comments  Coordinated with pt, wife, and RN in regards to not premedicating with anything but over the counter meds to simulate mobility and assess tolerance with mobility on these meds at home. Pt was experiencing pain, but able to push through it to ambulate down the hall and also navigate x4 stairs today. He did better taking his time and extending his knees on the stairs to improve his safety, only needing minA today. Educated pt and wife on frequency and progression of mobility as tolerated at d/c and safe guarding of pt on stairs at d/c. They verbalized understanding and no further questions at this time. Pt and wife opting to d/c home instead of SNF at this time. If so, then recommend HHPT. Will continue to follow acutely.   If plan is discharge home, recommend the following: Help with stairs or ramp for entrance;Assistance with cooking/housework;Assist for transportation;A little help with walking and/or transfers;A little help with bathing/dressing/bathroom   Can travel by private vehicle     Yes  Equipment Recommendations  Rolling walker (2 wheels)    Recommendations for Other Services       Precautions / Restrictions Precautions Precautions: Fall Recall of Precautions/Restrictions: Intact Precaution/Restrictions Comments: back precautions for comfort? (provided handout) Restrictions Weight Bearing Restrictions Per Provider Order: No     Mobility  Bed Mobility Overal bed mobility: Needs Assistance Bed Mobility: Rolling, Sidelying to Sit, Sit to Sidelying Rolling:  Supervision Sidelying to sit: Contact guard assist     Sit to sidelying: Contact guard assist General bed mobility comments: Pt exiting/entering R EOB with rails down and bed flat to simulate home set-up.    Transfers Overall transfer level: Needs assistance Equipment used: Rolling walker (2 wheels) Transfers: Sit to/from Stand, Bed to chair/wheelchair/BSC Sit to Stand: Contact guard assist   Step pivot transfers: Contact guard assist       General transfer comment: Pt needs reminders to push up from bed or chair rather than pull up on RW to transfer to stand. Cues also needed to reach back to sit. CGA for safety with all    Ambulation/Gait Ambulation/Gait assistance: Contact guard assist Gait Distance (Feet): 75 Feet Assistive device: Rolling walker (2 wheels) Gait Pattern/deviations: Step-through pattern, Decreased stride length, Decreased dorsiflexion - left, Decreased dorsiflexion - right, Trunk flexed, Knee flexed in stance - right, Knee flexed in stance - left Gait velocity: WFL Gait velocity interpretation: >2.62 ft/sec, indicative of community ambulatory   General Gait Details: Pt ambulates with a mildly flexed posture with knees flexed. No LOB, CGA for safety   Stairs Stairs: Yes Stairs assistance: Min assist Stair Management: Step to pattern, Forwards, One rail Left, One rail Right Number of Stairs: 4 General stair comments: Ascends and descends forwards with L rail up and R rail down. Verbal and visual cues provided prior to stairs on navigating with step-to pattern and focusing on extending knees in stance to reduce buckling. Less buckling or at least better recovery noted by pt this date. MinA needed for balance. Educated wife on how to safely guard pt on stairs.  She verbalized understanding   Wheelchair Mobility     Tilt Bed    Modified Rankin (Stroke Patients Only)       Balance Overall balance assessment: Needs assistance Sitting-balance support:  Feet supported, Bilateral upper extremity supported, No upper extremity supported Sitting balance-Leahy Scale: Fair Sitting balance - Comments: Pt sat EOB with supervision and varying UE support.   Standing balance support: Bilateral upper extremity supported, During functional activity, Reliant on assistive device for balance Standing balance-Leahy Scale: Poor Standing balance comment: Pt dependent on UE support for OOB mobility.                            Communication Communication Communication: No apparent difficulties  Cognition Arousal: Alert Behavior During Therapy: WFL for tasks assessed/performed   PT - Cognitive impairments: Safety/Judgement                       PT - Cognition Comments: Needs cues to slow down to focus on technique and safety at times Following commands: Intact      Cueing Cueing Techniques: Verbal cues, Tactile cues, Visual cues  Exercises      General Comments General comments (skin integrity, edema, etc.): edcuated pt and wife on monitoring pain and progressing mobility as tolerated while trying to mobilize frequently as able as well, they verbalized understanding      Pertinent Vitals/Pain Pain Assessment Pain Assessment: Faces Faces Pain Scale: Hurts even more Pain Location: back, R hip Pain Descriptors / Indicators: Discomfort, Sore, Aching, Moaning, Grimacing, Guarding Pain Intervention(s): Limited activity within patient's tolerance, Monitored during session, Repositioned, RN gave pain meds during session (just ibuprofen)    Home Living                          Prior Function            PT Goals (current goals can now be found in the care plan section) Acute Rehab PT Goals Patient Stated Goal: Get stronger and reduce pain PT Goal Formulation: With patient/family Time For Goal Achievement: 07/01/23 Potential to Achieve Goals: Good Progress towards PT goals: Progressing toward goals     Frequency    Min 3X/week      PT Plan      Co-evaluation              AM-PAC PT "6 Clicks" Mobility   Outcome Measure  Help needed turning from your back to your side while in a flat bed without using bedrails?: A Little Help needed moving from lying on your back to sitting on the side of a flat bed without using bedrails?: A Little Help needed moving to and from a bed to a chair (including a wheelchair)?: A Little Help needed standing up from a chair using your arms (e.g., wheelchair or bedside chair)?: A Little Help needed to walk in hospital room?: A Little Help needed climbing 3-5 steps with a railing? : A Little 6 Click Score: 18    End of Session Equipment Utilized During Treatment: Gait belt Activity Tolerance: Patient limited by pain Patient left: in bed;with call bell/phone within reach;with bed alarm set Nurse Communication: Mobility status;Other (comment) (pain meds 30 min prior to d/c for car ride home) PT Visit Diagnosis: Unsteadiness on feet (R26.81);Other abnormalities of gait and mobility (R26.89);Muscle weakness (generalized) (M62.81);Difficulty in walking, not elsewhere classified (R26.2);Pain Pain - Right/Left: Right Pain -  part of body: Hip (Back)     Time: 4132-4401 PT Time Calculation (min) (ACUTE ONLY): 31 min  Charges:    $Gait Training: 8-22 mins $Therapeutic Activity: 8-22 mins PT General Charges $$ ACUTE PT VISIT: 1 Visit                     Virgil Benedict, PT, DPT Acute Rehabilitation Services  Office: [phone]    Bettina Gavia 06/29/2023, 3:03 PM

## 2023-06-29 NOTE — TOC Progression Note (Signed)
 Transition of Care [facility]) - Progression Note    Patient Details  Name: Grant Bowers MRN: [id number] Date of Birth: March 04, 1946  Transition of [facility]) CM/SW Contact  Graves-Bigelow, Lamar Laundry, RN Phone Number: 06/29/2023, 4:07 PM  Clinical Narrative:  Patient has decided to go home with home health services. [facility] will deliver the antibiotics to the room by 8:00 pm tonight. Spouse decided that patient will dc in am after morning dose and take the boxed antibiotics home. Case Manager has faxed clinicals to [facility] @ [phone]. Agency states they will see the patient for PT on Sunday or Monday and for RN to visit on Tuesday. Spouse is aware to call [facility] to make them aware that the patient is home. Weekend Case Manager to call [facility] On call @ [phone] when the patient leaves the hospital. No further needs identified at this time.     Expected Discharge Plan: Home w Home Health Services Barriers to Discharge: No Barriers Identified  Expected Discharge Plan and Services In-house Referral: Clinical Social Work Discharge Planning Services: CM Consult Post Acute Care Choice: Home Health Living arrangements for the past 2 months: Single Family Home Expected Discharge Date: 06/29/23               DME Arranged: IV pump/equipment DME Agency:  Julianne Rice) Date DME Agency Contacted: 06/22/23 Time DME Agency Contacted: 1030 Representative spoke with at DME Agency: Pam HH Arranged: RN, Disease Management, PT HH Agency: [facility] Date [facility] Agency Contacted: 06/22/23 Time HH Agency Contacted: 1015 Representative spoke with at [facility] Agency: Lorie   Social Determinants of Health (SDOH) Interventions SDOH Screenings   Food Insecurity: No Food Insecurity (06/15/2023)  Housing: Low Risk  (06/15/2023)  Transportation Needs: No Transportation Needs (06/15/2023)  Utilities: Not At Risk (06/15/2023)  Depression (PHQ2-9): Low Risk   (04/17/2023)  Social Connections: Moderately Isolated (06/15/2023)  Tobacco Use: Low Risk  (06/21/2023)    Readmission Risk Interventions    06/15/2023    1:42 PM  Readmission Risk Prevention Plan  Transportation Screening Complete  HRI or Home Care Consult Complete  Social Work Consult for Recovery Care Planning/Counseling Complete  Palliative Care Screening Not Applicable  Medication Review Oceanographer) Complete

## 2023-06-30 DIAGNOSIS — I330 Acute and subacute infective endocarditis: Secondary | ICD-10-CM | POA: Diagnosis not present

## 2023-06-30 DIAGNOSIS — M4620 Osteomyelitis of vertebra, site unspecified: Secondary | ICD-10-CM | POA: Diagnosis not present

## 2023-06-30 DIAGNOSIS — B955 Unspecified streptococcus as the cause of diseases classified elsewhere: Secondary | ICD-10-CM | POA: Diagnosis not present

## 2023-06-30 LAB — GLUCOSE, CAPILLARY: Glucose-Capillary: 143 mg/dL — ABNORMAL HIGH (ref 70–99)

## 2023-06-30 NOTE — Progress Notes (Signed)
 Pt to be DC today. Contacted [facility], spoke to [place], Charity fundraiser, informed her that pt is going home today. Notified Pam with [facility] that pt has been DC today.

## 2023-07-02 ENCOUNTER — Other Ambulatory Visit (HOSPITAL_COMMUNITY): Payer: Self-pay

## 2023-07-02 ENCOUNTER — Telehealth: Payer: Self-pay | Admitting: *Deleted

## 2023-07-02 DIAGNOSIS — K8020 Calculus of gallbladder without cholecystitis without obstruction: Secondary | ICD-10-CM | POA: Diagnosis not present

## 2023-07-02 DIAGNOSIS — M4620 Osteomyelitis of vertebra, site unspecified: Secondary | ICD-10-CM | POA: Diagnosis not present

## 2023-07-02 DIAGNOSIS — E1165 Type 2 diabetes mellitus with hyperglycemia: Secondary | ICD-10-CM | POA: Diagnosis not present

## 2023-07-02 DIAGNOSIS — E1122 Type 2 diabetes mellitus with diabetic chronic kidney disease: Secondary | ICD-10-CM | POA: Diagnosis not present

## 2023-07-02 DIAGNOSIS — I129 Hypertensive chronic kidney disease with stage 1 through stage 4 chronic kidney disease, or unspecified chronic kidney disease: Secondary | ICD-10-CM | POA: Diagnosis not present

## 2023-07-02 DIAGNOSIS — E440 Moderate protein-calorie malnutrition: Secondary | ICD-10-CM | POA: Diagnosis not present

## 2023-07-02 NOTE — Transitions of Care (Post Inpatient/ED Visit) (Signed)
   07/02/2023  Name: Masud Holub MRN: [id number] DOB: 1945/11/08  Today's TOC FU Call Status: Today's TOC FU Call Status:: Unsuccessful Call (1st Attempt) Unsuccessful Call (1st Attempt) Date: 07/02/23  Attempted to reach the patient regarding the most recent Inpatient visit  Phone rang multiple times with eventual automated outgoing message, stating, "we are sorry your call cannot be completed at this time; please try your call again later;" with spontaneous ending of call attempt; unable to leave message requesting call back   Follow Up Plan: Additional outreach attempts will be made to reach the patient to complete the Transitions of Care (Post Inpatient visit) call.   Pls call/ message for questions,  Caryl Pina, RN, BSN, CCRN Alumnus RN Care Manager  Transitions of Care  [facility] - [facility] [phone]: direct office

## 2023-07-03 ENCOUNTER — Telehealth: Payer: Self-pay | Admitting: *Deleted

## 2023-07-03 DIAGNOSIS — E1122 Type 2 diabetes mellitus with diabetic chronic kidney disease: Secondary | ICD-10-CM | POA: Diagnosis not present

## 2023-07-03 DIAGNOSIS — K8020 Calculus of gallbladder without cholecystitis without obstruction: Secondary | ICD-10-CM | POA: Diagnosis not present

## 2023-07-03 DIAGNOSIS — I129 Hypertensive chronic kidney disease with stage 1 through stage 4 chronic kidney disease, or unspecified chronic kidney disease: Secondary | ICD-10-CM | POA: Diagnosis not present

## 2023-07-03 DIAGNOSIS — E440 Moderate protein-calorie malnutrition: Secondary | ICD-10-CM | POA: Diagnosis not present

## 2023-07-03 DIAGNOSIS — M4620 Osteomyelitis of vertebra, site unspecified: Secondary | ICD-10-CM | POA: Diagnosis not present

## 2023-07-03 DIAGNOSIS — E1165 Type 2 diabetes mellitus with hyperglycemia: Secondary | ICD-10-CM | POA: Diagnosis not present

## 2023-07-03 NOTE — Transitions of Care (Post Inpatient/ED Visit) (Signed)
   07/03/2023  Name: Lyndle Pang MRN: [id number] DOB: [birth year]  Today's TOC FU Call Status: Today's TOC FU Call Status:: Unsuccessful Call (2nd Attempt) Unsuccessful Call (2nd Attempt) Date: 07/03/23  Attempted to reach the patient regarding the most recent Inpatient visit; Received automated outgoing message stating, "the number you have dialed is not in service; please try your call again later;" unable to leave HIPAA compliant voice message requesting call-back    Follow Up Plan: Additional outreach attempts will be made to reach the patient to complete the Transitions of Care (Post Inpatient visit) call.   Pls call/ message for questions,  Caryl Pina, RN, BSN, CCRN Alumnus RN Care Manager  Transitions of Care  [facility] - [facility] [phone]: direct office

## 2023-07-04 ENCOUNTER — Telehealth: Payer: Self-pay | Admitting: *Deleted

## 2023-07-04 NOTE — Transitions of Care (Post Inpatient/ED Visit) (Signed)
   07/04/2023  Name: Grant Bowers MRN: [id number] DOB: 12-04-1945  Today's TOC FU Call Status: Today's TOC FU Call Status:: Unsuccessful Call (3rd Attempt) Unsuccessful Call (3rd Attempt) Date: 07/04/23  Attempted to reach the patient regarding the most recent Inpatient visit.  Received automated outgoing message stating, "the number you have dialed is not in service; please try your call again later;" unable to leave HIPAA compliant voice message requesting call-back    Follow Up Plan: No further outreach attempts will be made at this time. We have been unable to contact the patient.  Pls call/ message for questions,  Caryl Pina, RN, BSN, CCRN Alumnus RN Care Manager  Transitions of Care  [facility] - [facility] [phone]: direct office

## 2023-07-05 ENCOUNTER — Ambulatory Visit: Attending: Cardiology

## 2023-07-05 DIAGNOSIS — B955 Unspecified streptococcus as the cause of diseases classified elsewhere: Secondary | ICD-10-CM | POA: Diagnosis not present

## 2023-07-05 DIAGNOSIS — I330 Acute and subacute infective endocarditis: Secondary | ICD-10-CM | POA: Diagnosis not present

## 2023-07-05 NOTE — Progress Notes (Signed)
 Pt seen in device clinic for wound check and suture removal post system extraction.  Pt with 5 sutures.  These were all removed without difficulty.  Wound site is well healed.  No s/s of infection.  Pt/wife advised to continue to monitor and call device clinic with any concerns.  Pt has follow up scheduled.

## 2023-07-05 NOTE — Patient Instructions (Signed)
 After Your Appointment   Monitor your site for redness, swelling, and drainage. Call the device clinic at [phone] if you experience these symptoms or fever/chills.  Your incision was closed with Steri-strips or staples:  You may shower 7 days after your procedure and wash your incision with soap and water. Avoid lotions, ointments, or perfumes over your incision until it is well-healed.

## 2023-07-06 ENCOUNTER — Other Ambulatory Visit (HOSPITAL_COMMUNITY): Payer: Self-pay | Admitting: Urology

## 2023-07-06 DIAGNOSIS — R972 Elevated prostate specific antigen [PSA]: Secondary | ICD-10-CM

## 2023-07-06 DIAGNOSIS — I129 Hypertensive chronic kidney disease with stage 1 through stage 4 chronic kidney disease, or unspecified chronic kidney disease: Secondary | ICD-10-CM | POA: Diagnosis not present

## 2023-07-06 DIAGNOSIS — E1165 Type 2 diabetes mellitus with hyperglycemia: Secondary | ICD-10-CM | POA: Diagnosis not present

## 2023-07-06 DIAGNOSIS — E440 Moderate protein-calorie malnutrition: Secondary | ICD-10-CM | POA: Diagnosis not present

## 2023-07-06 DIAGNOSIS — E1122 Type 2 diabetes mellitus with diabetic chronic kidney disease: Secondary | ICD-10-CM | POA: Diagnosis not present

## 2023-07-06 DIAGNOSIS — M4620 Osteomyelitis of vertebra, site unspecified: Secondary | ICD-10-CM | POA: Diagnosis not present

## 2023-07-06 DIAGNOSIS — K8020 Calculus of gallbladder without cholecystitis without obstruction: Secondary | ICD-10-CM | POA: Diagnosis not present

## 2023-07-06 LAB — LAB REPORT - SCANNED: EGFR: 89

## 2023-07-10 ENCOUNTER — Encounter: Payer: Self-pay | Admitting: Internal Medicine

## 2023-07-10 ENCOUNTER — Ambulatory Visit (INDEPENDENT_AMBULATORY_CARE_PROVIDER_SITE_OTHER): Admitting: Internal Medicine

## 2023-07-10 VITALS — BP 148/76 | HR 52 | Temp 98.2°F | Ht 72.0 in

## 2023-07-10 DIAGNOSIS — Z23 Encounter for immunization: Secondary | ICD-10-CM

## 2023-07-10 DIAGNOSIS — M545 Low back pain, unspecified: Secondary | ICD-10-CM | POA: Diagnosis not present

## 2023-07-10 DIAGNOSIS — M4620 Osteomyelitis of vertebra, site unspecified: Secondary | ICD-10-CM | POA: Diagnosis not present

## 2023-07-10 MED ORDER — KETOROLAC TROMETHAMINE 30 MG/ML IJ SOLN
30.0000 mg | Freq: Once | INTRAMUSCULAR | Status: AC
  Administered 2023-07-10: 30 mg via INTRAMUSCULAR

## 2023-07-10 MED ORDER — OXYCODONE HCL 10 MG PO TABS: 10.0000 mg | ORAL_TABLET | Freq: Four times a day (QID) | ORAL | 0 refills | Status: DC | PRN

## 2023-07-10 NOTE — Patient Instructions (Addendum)
 Ok for the Toradol 30 mg IM today  Ok to increase the oxycodone to 10 mg up to 4 times per day and watch for worsening constipation  Please call if you need the PT and OT re ordered  Please continue all other medications as before, and refills have been done if requested.  Please have the pharmacy call with any other refills you may need.  Please keep your appointments with your specialists as you may have planned - cardiology, ID, dental  Please make an Appointment to return in 3 months, or sooner if needed

## 2023-07-10 NOTE — Progress Notes (Unsigned)
 Patient ID: Grant Bowers, male   DOB: Sep 01, 1945, 78 y.o.   MRN: [id number]        Chief Complaint: follow up post hospn mar 6 - 22 with vertebral osteomyelitis       HPI:  Grant Bowers is a 78 y.o. male here with c/o         Transitional Care Management elements noted today: 1)  Date of D/C: as above 2)  Medication reconciliation:  done today at end visit 3)  Review of D/C summary or other information:  done today 4)  Review of need for f/u on pending diagnostic tests and treatments:  done today 5)  Review of need for Interaction with other providers who will assume or resume care of pt specific problems: done today 6)  Education of patient/family/guardian or caregiver: done today  Wt Readings from Last 3 Encounters:  06/14/23 177 lb 4 oz (80.4 kg)  06/04/23 177 lb 4 oz (80.4 kg)  05/21/23 184 lb 12.8 oz (83.8 kg)   BP Readings from Last 3 Encounters:  07/10/23 (!) 148/76  06/30/23 (!) 152/82  06/05/23 132/65         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   [facility];   followed by EP cardiology--- dr Graciela Husbands   Diabetes mellitus without complication (HCC)    Dyspnea due to COVID-19    with exertion   ED (erectile dysfunction)    GERD (gastroesophageal reflux disease)    History of 2019 novel coronavirus disease (COVID-19) 05/07/2020   per pt mild symptoms all resolved with exception DOE   History of cardiac murmur as a child    History of kidney stones    Hyperlipidemia    Hypertension    followed by pcp and cardiology   Hypogonadism male    Mild atherosclerosis of both carotid arteries 09/23/2015   duplex doppler in epic bilateral ICA 1-39%   OSA on CPAP    per pt uses nightly   Post-COVID chronic dyspnea    had covid 05-07-2020;  per pt never had issue prior to covid ;  PFT 10-19-2020 normal per dr c. young   Right ureteral calculus  02/11/2010   Skin cancer    Calf   Trifascicular block    alternating with BBB per dr Graciela Husbands note;  s/p PPM 07-09-2020;   echo 07-29-2020 mild LVH, G1DD, ef 65-70%, moderate LAE, mild MR without stenosis   Past Surgical History:  Procedure Laterality Date   CARDIAC CATHETERIZATION  2009   per pt in [place] , told was normal   CARDIAC CATHETERIZATION  02/17/2010   @[facility]   by dr Clifton Calin Fantroy--- no angiographic evidence cad, normal lvsf,  false positive stress test   COLONOSCOPY  12/07/2006   [facility] in [place]    CYSTOSCOPY WITH RETROGRADE PYELOGRAM, URETEROSCOPY AND STENT PLACEMENT Right 10/29/2020   Procedure: CYSTOSCOPY WITH RETROGRADE PYELOGRAM, URETEROSCOPY AND STENT PLACEMENT;  Surgeon: Marcine Matar, MD;  Location: [facility];  Service: Urology;  Laterality: Right;   CYSTOSCOPY WITH RETROGRADE PYELOGRAM, URETEROSCOPY AND STENT PLACEMENT Right 11/09/2020   Procedure: CYSTOSCOPY WITH RIGHT  RETROGRADE PYELOGRAM, URETEROSCOPY AND STENT EXHANGE;  Surgeon: Belva Agee, MD;  Location: [facility];  Service: Urology;  Laterality: Right;  30 MINS   CYSTOSCOPY WITH URETEROSCOPY, STONE BASKETRY AND STENT PLACEMENT  2012   CYSTOSCOPY/URETEROSCOPY/HOLMIUM  LASER/STENT PLACEMENT Right 12/20/2021   Procedure: CYSTOSCOPY/URETEROSCOPY/HOLMIUM LASER/STENT PLACEMENT;  Surgeon: Belva Agee, MD;  Location: [facility];  Service: Urology;  Laterality: Right;  45 MINS   EXTRACORPOREAL SHOCK WAVE LITHOTRIPSY Right 04/29/2020   Procedure: EXTRACORPOREAL SHOCK WAVE LITHOTRIPSY (ESWL);  Surgeon: Noel Christmas, MD;  Location: [facility];  Service: Urology;  Laterality: Right;   HOLMIUM LASER APPLICATION Right 11/09/2020   Procedure: HOLMIUM LASER APPLICATION;  Surgeon: Belva Agee, MD;  Location: [facility];  Service: Urology;  Laterality: Right;   KNEE ARTHROSCOPY Left 2008   LEAD EXTRACTION N/A 06/21/2023   Procedure: LEAD EXTRACTION;  Surgeon: Lanier Prude, MD;  Location: [facility] INVASIVE CV LAB;  Service: Cardiovascular;  Laterality: N/A;   PACEMAKER IMPLANT N/A 07/09/2020   Procedure: PACEMAKER IMPLANT;  Surgeon: Duke Salvia, MD;  Location: [facility] INVASIVE CV LAB;  Service: Cardiovascular;  Laterality: N/A;   PPM GENERATOR REMOVAL N/A 06/21/2023   Procedure: PPM GENERATOR REMOVAL;  Surgeon: Lanier Prude, MD;  Location: [facility] INVASIVE CV LAB;  Service: Cardiovascular;  Laterality: N/A;   TRANSESOPHAGEAL ECHOCARDIOGRAM (CATH LAB) N/A 06/18/2023   Procedure: TRANSESOPHAGEAL ECHOCARDIOGRAM;  Surgeon: Thurmon Fair, MD;  Location: [facility] INVASIVE CV LAB;  Service: Cardiovascular;  Laterality: N/A;    reports that he has never smoked. He has never used smokeless tobacco. He reports that he does not currently use alcohol. He reports that he does not use drugs. family history includes Colon polyps in his brother; Diabetes in his brother and father; Pancreatic cancer in his mother. No Known Allergies Current Outpatient Medications on File Prior to Visit  Medication Sig Dispense Refill   acetaminophen (TYLENOL) 325 MG tablet Take 2 tablets (650 mg total) by mouth every 6 (six) hours as needed for mild pain (pain score 1-3) (or Fever >/= 101). (Patient taking differently: Take 1,000 mg by mouth every 6 (six) hours as needed for mild pain (pain score 1-3) (or Fever >/= 101).)     Alcohol Swabs (B-D SINGLE USE SWABS REGULAR) PADS Use as directed once per day E11.9 100 each 12   alfuzosin (UROXATRAL) 10 MG 24 hr tablet Take 10 mg by mouth at bedtime.     amLODipine (NORVASC) 10 MG tablet Take 1 tablet (10 mg total) by mouth daily. 90 tablet 3   aspirin EC 81 MG tablet Take 1 tablet (81 mg total) by mouth daily with breakfast. 30 tablet 2   Blood Glucose Calibration (TRUE METRIX LEVEL 1) Low SOLN Use as directed once per day E11.9 1 each 1   Blood Glucose Monitoring Suppl (TRUE METRIX METER) DEVI USE AS DIRECTED TO test blood sugar EVERY DAY     cefTRIAXone  (ROCEPHIN) IVPB Inject 2 g into the vein daily. Indication:  Vertebral osteomyelitis  First Dose: Yes Last Day of Therapy:  07/28/23  Labs - Once weekly:  CBC/D and BMP, Labs - Once weekly: ESR and CRP Method of administration: IV Push Method of administration may be changed at the discretion of home infusion pharmacist based upon assessment of the patient and/or caregiver's ability to self-administer the medication ordered. 36 Units 0   Cholecalciferol (VITAMIN D3 SUPER STRENGTH) 50 MCG (2000 UT) TABS Take 2,000 Units by mouth daily.     glucose blood (TRUE METRIX BLOOD GLUCOSE TEST) test strip TEST BLOOD SUGAR TWO TIMES DAILY AS INSTRUCTED 200 strip 3   [Paused] hydrochlorothiazide (MICROZIDE) 12.5 MG capsule Take 1 capsule (12.5 mg total) by mouth  daily. 90 capsule 3   irbesartan (AVAPRO) 300 MG tablet Take 1 tablet (300 mg total) by mouth daily. 90 tablet 3   lidocaine (LIDODERM) 5 % Place 2 patches onto the skin daily. Remove & Discard patch within 12 hours or as directed by MD 30 patch 0   metFORMIN (GLUCOPHAGE-XR) 500 MG 24 hr tablet TAKE 1 TABLET EVERY DAY WITH BREAKFAST 90 tablet 3   methocarbamol (ROBAXIN) 500 MG tablet Take 1 tablet (500 mg total) by mouth 3 (three) times daily. 90 tablet 3   mirtazapine (REMERON) 15 MG tablet Take 1 tablet (15 mg total) by mouth at bedtime. 30 tablet 3   omeprazole (PRILOSEC) 10 MG capsule Take 10 mg by mouth daily.     oxyCODONE (OXY IR/ROXICODONE) 5 MG immediate release tablet Take 1 tablet (5 mg total) by mouth every 4 (four) hours as needed for moderate pain (pain score 4-6). 30 tablet 0   pantoprazole (PROTONIX) 40 MG tablet Take 1 tablet (40 mg total) by mouth daily. 30 tablet 1   polyethylene glycol (MIRALAX / GLYCOLAX) 17 g packet Take 17 g by mouth 2 (two) times daily. 60 each 3   rosuvastatin (CRESTOR) 20 MG tablet TAKE 1 TABLET EVERY DAY 90 tablet 3   senna-docusate (SENOKOT-S) 8.6-50 MG tablet Take 2 tablets by mouth at bedtime. 60 tablet 3    tolterodine (DETROL LA) 2 MG 24 hr capsule Take 2 mg by mouth daily.     TRUEplus Lancets 33G MISC Use as directed once per day E11.9 100 each 12   zinc gluconate 50 MG tablet Take 50 mg by mouth daily.     No current facility-administered medications on file prior to visit.        ROS:  All others reviewed and negative.  Objective        PE:  BP (!) 148/76 (BP Location: Left Arm, Patient Position: Sitting, Cuff Size: Normal)   Pulse (!) 52   Temp 98.2 F (36.8 C) (Oral)   Ht 6' (1.829 m)   SpO2 98%   BMI 24.04 kg/m                 Constitutional: Pt appears in NAD               HENT: Head: NCAT.                Right Ear: External ear normal.                 Left Ear: External ear normal.                Eyes: . Pupils are equal, round, and reactive to light. Conjunctivae and EOM are normal               Nose: without d/c or deformity               Neck: Neck supple. Gross normal ROM               Cardiovascular: Normal rate and regular rhythm.                 Pulmonary/Chest: Effort normal and breath sounds without rales or wheezing.                Abd:  Soft, NT, ND, + BS, no organomegaly               Neurological: Pt is alert. At baseline orientation, motor  grossly intact               Skin: Skin is warm. No rashes, no other new lesions, LE edema - ***               Psychiatric: Pt behavior is normal without agitation   Micro: none  Cardiac tracings I have personally interpreted today:  none  Pertinent Radiological findings (summarize): none   Lab Results  Component Value Date   WBC 7.1 06/27/2023   HGB 8.6 (L) 06/27/2023   HCT 26.9 (L) 06/27/2023   PLT 240 06/27/2023   GLUCOSE 146 (H) 06/27/2023   CHOL 96 (L) 05/03/2023   TRIG 82 05/03/2023   HDL 30 (L) 05/03/2023   LDLDIRECT 150.3 02/11/2010   LDLCALC 49 05/03/2023   ALT 45 (H) 06/22/2023   AST 30 06/22/2023   NA 135 06/27/2023   K 4.0 06/27/2023   CL 106 06/27/2023   CREATININE 1.03 06/27/2023   BUN  24 (H) 06/27/2023   CO2 22 06/27/2023   TSH 2.600 05/03/2023   PSA 9.40 (H) 04/17/2023   INR 1.2 ratio (H) 02/15/2010   HGBA1C 6.2 04/17/2023   MICROALBUR 36.0 (H) 04/17/2023   Assessment/Plan:  Grant Bowers is a 78 y.o. White or Caucasian [1] male with  has a past medical history of BPH with elevated PSA, Cardiac pacemaker in situ (07/09/2020), Diabetes mellitus without complication (HCC), Dyspnea due to COVID-19, ED (erectile dysfunction), GERD (gastroesophageal reflux disease), History of 2019 novel coronavirus disease (COVID-19) (05/07/2020), History of cardiac murmur as a child, History of kidney stones, Hyperlipidemia, Hypertension, Hypogonadism male, Mild atherosclerosis of both carotid arteries (09/23/2015), OSA on CPAP, Post-COVID chronic dyspnea, Right ureteral calculus (02/11/2010), Skin cancer, and Trifascicular block.  No problem-specific Assessment & Plan notes found for this encounter.  Followup: No follow-ups on file.  Oliver Barre, MD 07/10/2023 3:02 PM [facility]

## 2023-07-12 ENCOUNTER — Telehealth: Payer: Self-pay

## 2023-07-12 ENCOUNTER — Encounter: Payer: Self-pay | Admitting: Internal Medicine

## 2023-07-12 ENCOUNTER — Other Ambulatory Visit: Payer: Self-pay

## 2023-07-12 ENCOUNTER — Ambulatory Visit: Admitting: Internal Medicine

## 2023-07-12 VITALS — BP 102/58 | HR 86 | Temp 97.4°F

## 2023-07-12 DIAGNOSIS — I330 Acute and subacute infective endocarditis: Secondary | ICD-10-CM | POA: Diagnosis not present

## 2023-07-12 DIAGNOSIS — B955 Unspecified streptococcus as the cause of diseases classified elsewhere: Secondary | ICD-10-CM | POA: Diagnosis not present

## 2023-07-12 NOTE — Progress Notes (Signed)
Labs drawn via PICC line per Dr. Singh. Line flushed with 10 mL normal saline and clamped. Patient tolerated procedure well.    Shyrl Obi D Tishana Clinkenbeard, RN  

## 2023-07-12 NOTE — Progress Notes (Signed)
 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) 06/04/2023   Type 2 diabetes mellitus with hyperglycemia (HCC) 06/04/2023   BPH (benign prostatic hyperplasia) 06/04/2023   OSA on CPAP 06/04/2023   Gallstones 06/04/2023   Pain of right hip 06/04/2023   Abdominal pain 06/03/2023   Acute midline low back pain without sciatica 05/31/2023   Postviral syndrome 05/21/2023   Sinus tachycardia 05/21/2023   Vision loss, 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   Long term (current) use of oral hypoglycemic drugs 01/02/2023   CKD (chronic kidney disease) 04/25/2021   Diabetes (HCC) 03/19/2021   Trigger thumb of left hand 02/22/2021   Vitamin D deficiency 11/16/2020   Bradycardia 10/12/2020   Cardiac pacemaker - STJ 10/12/2020   Allergic rhinitis 10/31/2018   Paresthesias 08/08/2016   Mixed hyperlipidemia 09/26/2015   Carotid artery stenosis 09/23/2015   Abnormal blood chemistry 09/18/2014   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   Vertigo 09/13/2011   Encounter for well adult exam with abnormal findings 03/11/2011   Nonspecific abnormal results of cardiovascular function study 02/15/2010   RENAL CALCULUS 02/11/2010   Prolonged QT interval 02/11/2010   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   Essential hypertension, benign 04/08/2008   GERD 04/08/2008   PSA, INCREASED 04/08/2008   NEPHROLITHIASIS, HX OF 04/08/2008     Subjective:  Grant Bowers is a 78 y.o. male with past medical history as below presents for hospital follow-up of strep mitis/oralis bacteremia concern for vertebral  osteomyelitis.  Patient was admitted to [facility] 3/1 - 3/22, presenting worsening back pain.  Blood cultures grew strep mitis from 2/2 blood cultures.  CT abdomen pelvis with concern for osteomyelitis at L4.  Given pacemaker, not MRI compatible forewent MRI at that time.  IR aspiration not necessary as likely the same organism as blood cultures.  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  Pacemaker was extracted on 06/21/2023.  Patient is tolerating antibiotics.  No new complaints.  Review of Systems  All other systems reviewed and are negative.   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   [facility];   followed by EP cardiology--- dr Graciela Husbands   Diabetes mellitus without complication (HCC)    Dyspnea due to COVID-19    with exertion   ED (erectile dysfunction)    GERD (gastroesophageal reflux disease)    History of 2019 novel coronavirus disease (COVID-19) 05/07/2020   per pt mild symptoms all resolved with exception DOE   History of cardiac murmur as a child    History of kidney stones    Hyperlipidemia    Hypertension    followed by pcp and cardiology   Hypogonadism male    Mild atherosclerosis of both carotid arteries 09/23/2015   duplex doppler in epic bilateral ICA 1-39%   OSA on CPAP    per pt uses nightly   Post-COVID chronic dyspnea    had covid 05-07-2020;  per pt never had issue prior  to covid ;  PFT 10-19-2020 normal per dr c. young   Right ureteral calculus 02/11/2010   Skin cancer    Calf   Trifascicular block    alternating with BBB per dr Graciela Husbands note;  s/p PPM 07-09-2020;   echo 07-29-2020 mild LVH, G1DD, ef 65-70%, moderate LAE, mild MR without stenosis    Outpatient Medications Prior to Visit  Medication Sig Dispense Refill   acetaminophen (TYLENOL) 325 MG tablet Take 2 tablets (650 mg total) by mouth every 6 (six) hours as  needed for mild pain (pain score 1-3) (or Fever >/= 101). (Patient taking differently: Take 1,000 mg by mouth every 6 (six) hours as needed for mild pain (pain score 1-3) (or Fever >/= 101).)     Alcohol Swabs (B-D SINGLE USE SWABS REGULAR) PADS Use as directed once per day E11.9 100 each 12   alfuzosin (UROXATRAL) 10 MG 24 hr tablet Take 10 mg by mouth at bedtime.     amLODipine (NORVASC) 10 MG tablet Take 1 tablet (10 mg total) by mouth daily. 90 tablet 3   aspirin EC 81 MG tablet Take 1 tablet (81 mg total) by mouth daily with breakfast. 30 tablet 2   Blood Glucose Calibration (TRUE METRIX LEVEL 1) Low SOLN Use as directed once per day E11.9 1 each 1   Blood Glucose Monitoring Suppl (TRUE METRIX METER) DEVI USE AS DIRECTED TO test blood sugar EVERY DAY     cefTRIAXone (ROCEPHIN) IVPB Inject 2 g into the vein daily. Indication:  Vertebral osteomyelitis  First Dose: Yes Last Day of Therapy:  07/28/23  Labs - Once weekly:  CBC/D and BMP, Labs - Once weekly: ESR and CRP Method of administration: IV Push Method of administration may be changed at the discretion of home infusion pharmacist based upon assessment of the patient and/or caregiver's ability to self-administer the medication ordered. 36 Units 0   Cholecalciferol (VITAMIN D3 SUPER STRENGTH) 50 MCG (2000 UT) TABS Take 2,000 Units by mouth daily.     glucose blood (TRUE METRIX BLOOD GLUCOSE TEST) test strip TEST BLOOD SUGAR TWO TIMES DAILY AS INSTRUCTED 200 strip 3   irbesartan (AVAPRO) 300 MG tablet Take 1 tablet (300 mg total) by mouth daily. 90 tablet 3   lidocaine (LIDODERM) 5 % Place 2 patches onto the skin daily. Remove & Discard patch within 12 hours or as directed by MD 30 patch 0   metFORMIN (GLUCOPHAGE-XR) 500 MG 24 hr tablet TAKE 1 TABLET EVERY DAY WITH BREAKFAST 90 tablet 3   methocarbamol (ROBAXIN) 500 MG tablet Take 1 tablet (500 mg total) by mouth 3 (three) times daily. 90 tablet 3   mirtazapine (REMERON) 15 MG tablet Take 1  tablet (15 mg total) by mouth at bedtime. 30 tablet 3   omeprazole (PRILOSEC) 10 MG capsule Take 10 mg by mouth daily.     Oxycodone HCl 10 MG TABS Take 1 tablet (10 mg total) by mouth 4 (four) times daily as needed. 120 tablet 0   pantoprazole (PROTONIX) 40 MG tablet Take 1 tablet (40 mg total) by mouth daily. 30 tablet 1   polyethylene glycol (MIRALAX / GLYCOLAX) 17 g packet Take 17 g by mouth 2 (two) times daily. 60 each 3   rosuvastatin (CRESTOR) 20 MG tablet TAKE 1 TABLET EVERY DAY 90 tablet 3   senna-docusate (SENOKOT-S) 8.6-50 MG tablet Take 2 tablets by mouth at bedtime. 60 tablet 3   tolterodine (DETROL LA) 2 MG 24 hr capsule Take  2 mg by mouth daily.     TRUEplus Lancets 33G MISC Use as directed once per day E11.9 100 each 12   zinc gluconate 50 MG tablet Take 50 mg by mouth daily.     hydrochlorothiazide (MICROZIDE) 12.5 MG capsule Take 1 capsule (12.5 mg total) by mouth daily. (Patient not taking: Reported on 07/12/2023) 90 capsule 3   No facility-administered medications prior to visit.     No Known Allergies  Social History   Tobacco Use   Smoking status: Never   Smokeless tobacco: Never  Vaping Use   Vaping status: Never Used  Substance Use Topics   Alcohol use: Not Currently    Comment: very rare    Drug use: Never    Family History  Problem Relation Age of Onset   Pancreatic cancer Mother    Diabetes Father    Diabetes Brother    Colon polyps Brother    Colon cancer Neg Hx    Esophageal cancer Neg Hx    Rectal cancer Neg Hx    Stomach cancer Neg Hx     Objective:   Vitals:   07/12/23 1339  BP: (!) 102/58  Pulse: 86  Temp: (!) 97.4 F (36.3 C)  TempSrc: Oral  SpO2: 98%   There is no height or weight on file to calculate BMI.  Physical Exam Constitutional:      General: He is not in acute distress.    Appearance: He is normal weight. He is not toxic-appearing.  HENT:     Head: Normocephalic and atraumatic.     Right Ear: External ear normal.      Left Ear: External ear normal.     Nose: No congestion or rhinorrhea.     Mouth/Throat:     Mouth: Mucous membranes are moist.     Pharynx: Oropharynx is clear.  Eyes:     Extraocular Movements: Extraocular movements intact.     Conjunctiva/sclera: Conjunctivae normal.     Pupils: Pupils are equal, round, and reactive to light.  Cardiovascular:     Rate and Rhythm: Normal rate and regular rhythm.     Heart sounds: No murmur heard.    No friction rub. No gallop.  Pulmonary:     Effort: Pulmonary effort is normal.     Breath sounds: Normal breath sounds.  Abdominal:     General: Abdomen is flat. Bowel sounds are normal.     Palpations: Abdomen is soft.  Musculoskeletal:        General: No swelling. Normal range of motion.     Cervical back: Normal range of motion and neck supple.  Skin:    General: Skin is warm and dry.  Neurological:     General: No focal deficit present.     Mental Status: He is oriented to person, place, and time.  Psychiatric:        Mood and Affect: Mood normal.     Lab Results: Lab Results  Component Value Date   WBC 7.1 06/27/2023   HGB 8.6 (L) 06/27/2023   HCT 26.9 (L) 06/27/2023   MCV 84.1 06/27/2023   PLT 240 06/27/2023    Lab Results  Component Value Date   CREATININE 1.03 06/27/2023   BUN 24 (H) 06/27/2023   NA 135 06/27/2023   K 4.0 06/27/2023   CL 106 06/27/2023   CO2 22 06/27/2023    Lab Results  Component Value Date   ALT 45 (H) 06/22/2023   AST 30  06/22/2023   ALKPHOS 72 06/22/2023   BILITOT 0.6 06/22/2023     Assessment & Plan:   78 year old male with diabetes, HTN, trifascicular block status post PPM admitted with worsening back pain found to have:   #Strep mitis/oralis bacteremia with  native MV IE #PPM implanted 07/09/20->extracted 06/21/23 #Concern for vertebral osteomyelitis - Blood cultures on 3/6 grew 2/2 strep mitis/oralis.  Repeat blood culture on 3/8 no growth. - CT showed concern for L4 osteomyelitis.   Forewent MRIs PPM not compatible.  No plans for aspiration given likely same organism as blood cultures.  Discharged with ceftriaxone x 6 weeks..  Given severe MR, recommended CVTS consult of metoprolol and option. -continue ctx till 4/23 6 weeks from ppm extraction then cefadroli 1gm bid to complete 8 weeks - Needs to see dentistry outpatient given poor dentition peripheral lucency with Modecate. -Sees cards, dr. Veleta Miners on 08/06/23 -f/u on 4/23  #med management #PICC - Labs ordered today   Danelle Earthly, MD [facility]   07/12/23  1:44 PM I have personally spent 42 minutes involved in face-to-face and non-face-to-face activities for this patient on the day of the visit. Professional time spent includes the following activities: Preparing to see the patient (review of tests), Obtaining and/or reviewing separately obtained history (admission/discharge record), Performing a medically appropriate examination and/or evaluation , Ordering medications/tests/procedures, referring and communicating with other health care professionals, Documenting clinical information in the EMR, Independently interpreting results (not separately reported), Communicating results to the patient/family/caregiver, Counseling and educating the patient/family/caregiver and Care coordination (not separately reported).

## 2023-07-12 NOTE — Telephone Encounter (Signed)
 Per Dr. Thedore Mins extend patient Iv abx till 4/23. Sent a message to [employer] about extension as well.

## 2023-07-13 ENCOUNTER — Encounter: Payer: Self-pay | Admitting: Internal Medicine

## 2023-07-13 ENCOUNTER — Ambulatory Visit: Payer: Medicare HMO | Admitting: Cardiovascular Disease

## 2023-07-13 DIAGNOSIS — M4620 Osteomyelitis of vertebra, site unspecified: Secondary | ICD-10-CM | POA: Diagnosis not present

## 2023-07-13 DIAGNOSIS — K8020 Calculus of gallbladder without cholecystitis without obstruction: Secondary | ICD-10-CM | POA: Diagnosis not present

## 2023-07-13 DIAGNOSIS — B955 Unspecified streptococcus as the cause of diseases classified elsewhere: Secondary | ICD-10-CM | POA: Diagnosis not present

## 2023-07-13 DIAGNOSIS — I330 Acute and subacute infective endocarditis: Secondary | ICD-10-CM | POA: Diagnosis not present

## 2023-07-13 DIAGNOSIS — I129 Hypertensive chronic kidney disease with stage 1 through stage 4 chronic kidney disease, or unspecified chronic kidney disease: Secondary | ICD-10-CM | POA: Diagnosis not present

## 2023-07-13 DIAGNOSIS — E1122 Type 2 diabetes mellitus with diabetic chronic kidney disease: Secondary | ICD-10-CM | POA: Diagnosis not present

## 2023-07-13 DIAGNOSIS — E440 Moderate protein-calorie malnutrition: Secondary | ICD-10-CM | POA: Diagnosis not present

## 2023-07-13 DIAGNOSIS — E1165 Type 2 diabetes mellitus with hyperglycemia: Secondary | ICD-10-CM | POA: Diagnosis not present

## 2023-07-13 LAB — CBC WITH DIFFERENTIAL/PLATELET
Absolute Lymphocytes: 1199 {cells}/uL (ref 850–3900)
Absolute Monocytes: 442 {cells}/uL (ref 200–950)
Basophils Absolute: 40 {cells}/uL (ref 0–200)
Basophils Relative: 0.6 %
Eosinophils Absolute: 389 {cells}/uL (ref 15–500)
Eosinophils Relative: 5.8 %
HCT: 31.4 % — ABNORMAL LOW (ref 38.5–50.0)
Hemoglobin: 9.9 g/dL — ABNORMAL LOW (ref 13.2–17.1)
MCH: 27 pg (ref 27.0–33.0)
MCHC: 31.5 g/dL — ABNORMAL LOW (ref 32.0–36.0)
MCV: 85.6 fL (ref 80.0–100.0)
MPV: 9.8 fL (ref 7.5–12.5)
Monocytes Relative: 6.6 %
Neutro Abs: 4630 {cells}/uL (ref 1500–7800)
Neutrophils Relative %: 69.1 %
Platelets: 258 10*3/uL (ref 140–400)
RBC: 3.67 10*6/uL — ABNORMAL LOW (ref 4.20–5.80)
RDW: 16.5 % — ABNORMAL HIGH (ref 11.0–15.0)
Total Lymphocyte: 17.9 %
WBC: 6.7 10*3/uL (ref 3.8–10.8)

## 2023-07-13 LAB — COMPLETE METABOLIC PANEL WITHOUT GFR
AG Ratio: 1.4 (calc) (ref 1.0–2.5)
ALT: 10 U/L (ref 9–46)
AST: 13 U/L (ref 10–35)
Albumin: 3 g/dL — ABNORMAL LOW (ref 3.6–5.1)
Alkaline phosphatase (APISO): 94 U/L (ref 35–144)
BUN: 11 mg/dL (ref 7–25)
CO2: 23 mmol/L (ref 20–32)
Calcium: 8.6 mg/dL (ref 8.6–10.3)
Chloride: 107 mmol/L (ref 98–110)
Creat: 0.89 mg/dL (ref 0.70–1.28)
Globulin: 2.1 g/dL (ref 1.9–3.7)
Glucose, Bld: 136 mg/dL — ABNORMAL HIGH (ref 65–99)
Potassium: 3.9 mmol/L (ref 3.5–5.3)
Sodium: 140 mmol/L (ref 135–146)
Total Bilirubin: 0.4 mg/dL (ref 0.2–1.2)
Total Protein: 5.1 g/dL — ABNORMAL LOW (ref 6.1–8.1)

## 2023-07-13 LAB — C-REACTIVE PROTEIN: CRP: 16.8 mg/L — ABNORMAL HIGH (ref <8.0)

## 2023-07-13 LAB — SEDIMENTATION RATE: Sed Rate: 9 mm/h (ref 0–20)

## 2023-07-13 NOTE — Assessment & Plan Note (Signed)
 With persistent pain uncontrolled worse with movement - for increased oxycodone 10 mg q 6 prn, also toradol 30 mg IM today

## 2023-07-14 DIAGNOSIS — M4620 Osteomyelitis of vertebra, site unspecified: Secondary | ICD-10-CM | POA: Diagnosis not present

## 2023-07-17 ENCOUNTER — Telehealth: Payer: Self-pay | Admitting: Cardiovascular Disease

## 2023-07-17 NOTE — Telephone Encounter (Signed)
   Pre-operative Risk Assessment    Patient Name: Grant Bowers  DOB: [birth year] MRN: [id number]   Date of last office visit: 05/21/23 Date of next office visit: 08/06/23 { Dental Requests If request is for dental extraction, please clarify the number of teeth to be extracted and place under No 1 below.   If the patient is currently at the dentist's office, send SecureChat message to Pre-Op CalDental Extraction - Amount of Teeth to be Pulled:  3 lback Staff (CMA/nurse) to input urgent request.   If the patient is not currently in the dentist's office, please route to the Pre-Op pool.              :1} Request for Surgical Clearance    Procedure:    Date of Surgery:  Clearance TBD                                Surgeon:  Dr. Thea Gist Surgeon's Group or Practice Name:  [facility]  Phone number:  [phone] Fax number:  [phone]   Type of Clearance Requested:   - Medical  - Pharmacy:  Hold        Type of Anesthesia:  Local    Additional requests/questions:      SignedFilomena Jungling   07/17/2023, 1:03 PM

## 2023-07-17 NOTE — Telephone Encounter (Signed)
   Name: Grant Bowers  DOB: 19-Jul-1945  MRN: [id number]  Primary Cardiologist: Marjo Bicker, MD  Chart reviewed as part of pre-operative protocol coverage. The patient has an upcoming visit scheduled with Sharlene Dory, NP on 08/17/2023 at which time clearance can be addressed in case there are any issues that would impact surgical recommendations.  Dental extraction is not scheduled until TBD as below. I added preop FYI to appointment note so that provider is aware to address at time of outpatient visit.  Per office protocol the cardiology provider should forward their finalized clearance decision and recommendations regarding antiplatelet therapy to the requesting party below.    Per office protocol, if patient is without any new symptoms or concerns at the time of their visit, he may hold aspirin for 5-7 days prior to procedure. Please resume aspirin as soon as possible postprocedure, at the discretion of the surgeon.    I will route this message as FYI to requesting party and remove this message from the preop box as separate preop APP input not needed at this time.   Please call with any questions.  Denyce Robert, NP  07/17/2023, 4:03 PM

## 2023-07-17 NOTE — Telephone Encounter (Signed)
 I will reach out to Dr. Nelly Laurence and ask if he would be able to provide preop clearance as dental office states urgent.    PROCEDURE: Dental Extraction - Amount of Teeth to be Pulled:  3 urgent request.

## 2023-07-17 NOTE — Telephone Encounter (Signed)
 Patient called in regarding getting seen asap for clearance - patient is scheduled with Dr. Nelly Laurence tomorrow 4/9 at 3pm.

## 2023-07-18 ENCOUNTER — Ambulatory Visit: Attending: Cardiovascular Disease | Admitting: Cardiovascular Disease

## 2023-07-18 ENCOUNTER — Encounter: Payer: Self-pay | Admitting: Cardiovascular Disease

## 2023-07-18 VITALS — BP 120/60 | HR 54 | Ht 72.0 in | Wt 170.0 lb

## 2023-07-18 DIAGNOSIS — I441 Atrioventricular block, second degree: Secondary | ICD-10-CM | POA: Diagnosis not present

## 2023-07-18 NOTE — Patient Instructions (Signed)
 Medication Instructions:  Your physician recommends that you continue on your current medications as directed. Please refer to the Current Medication list given to you today. *If you need a refill on your cardiac medications before your next appointment, please call your pharmacy*   Follow-Up: At [facility], you and your health needs are our priority.  As part of our continuing mission to provide you with exceptional heart care, our providers are all part of one team.  This team includes your primary Cardiologist (physician) and Advanced Practice Providers or APPs (Physician Assistants and Nurse Practitioners) who all work together to provide you with the care you need, when you need it.  Your next appointment:   3 month(s)  Provider:   You will see one of the following Advanced Practice Providers on your designated Care Team:   Francis Dowse, [place] Casimiro Needle "Mardelle Matte" Annabella, PA-C Sherie Don, NP Canary Brim, NP      1st Floor: - Lobby - Registration  - Pharmacy  - Lab - Cafe  2nd Floor: - PV Lab - Diagnostic Testing (echo, CT, nuclear med)  3rd Floor: - Vacant  4th Floor: - TCTS (cardiothoracic surgery) - AFib Clinic - Structural Heart Clinic - Vascular Surgery  - Vascular Ultrasound  5th Floor: - [facility] (general and EP) - Clinical Pharmacy for coumadin, hypertension, lipid, weight-loss medications, and med management appointments    Valet parking services will be available as well.

## 2023-07-18 NOTE — Progress Notes (Signed)
    PCP: [name], MD   Primary EP:  Dr Nelly Laurence  Grant Bowers is a 78 y.o. male who presents today for routine electrophysiology followup.  Since last being seen in our clinic, the patient reports doing very well.  He has occasional dizziness and SOB with moderate activity.    Today, he denies symptoms of palpitations, chest pain,  presyncope, or syncope.  The patient is otherwise without complaint today. he has no device related complaints -- no new tenderness, drainage, redness.  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  The the infection resulted in disruption of the mitral valve resulting in severe mitral regurgitation.  Prior to the extraction, he was RV paced 95% with a sensed AV delay of 150 ms and paced AV delay of 170 ms.  TTE 06/17/2023 EF 55 to 60%.  Small pericardial effusion.  TEE--intraoperative--June 18, 2023 EF 55 to 60% no clear large vegetation.  Flail leaflet with severe MR.  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  He  recently tolerated general anesthesia with device and lead extraction well.  He reports he is capable of climbing 2 flights of steps without dyspnea or chest discomfort.  I think he will be low risk for this procedure.  Regular exercise and increased activity advised  Return in a year  Maurice Small, MD 07/18/2023 3:21 PM

## 2023-07-20 DIAGNOSIS — E1165 Type 2 diabetes mellitus with hyperglycemia: Secondary | ICD-10-CM | POA: Diagnosis not present

## 2023-07-20 DIAGNOSIS — I330 Acute and subacute infective endocarditis: Secondary | ICD-10-CM | POA: Diagnosis not present

## 2023-07-20 DIAGNOSIS — E440 Moderate protein-calorie malnutrition: Secondary | ICD-10-CM | POA: Diagnosis not present

## 2023-07-20 DIAGNOSIS — K8020 Calculus of gallbladder without cholecystitis without obstruction: Secondary | ICD-10-CM | POA: Diagnosis not present

## 2023-07-20 DIAGNOSIS — M4620 Osteomyelitis of vertebra, site unspecified: Secondary | ICD-10-CM | POA: Diagnosis not present

## 2023-07-20 DIAGNOSIS — I129 Hypertensive chronic kidney disease with stage 1 through stage 4 chronic kidney disease, or unspecified chronic kidney disease: Secondary | ICD-10-CM | POA: Diagnosis not present

## 2023-07-20 DIAGNOSIS — E1122 Type 2 diabetes mellitus with diabetic chronic kidney disease: Secondary | ICD-10-CM | POA: Diagnosis not present

## 2023-07-20 DIAGNOSIS — B955 Unspecified streptococcus as the cause of diseases classified elsewhere: Secondary | ICD-10-CM | POA: Diagnosis not present

## 2023-07-23 ENCOUNTER — Telehealth: Payer: Self-pay | Admitting: Internal Medicine

## 2023-07-23 NOTE — Telephone Encounter (Signed)
 Unfortunately I believe signing a paper certificate is against the law , which now requires us  to place all entries and sign off on cause of death on NCDAVE system.

## 2023-07-23 NOTE — Telephone Encounter (Signed)
 Copied from CRM [id number]. Topic: General - Deceased Patient >> 08-05-23 10:37 AM Adaysia C wrote: Name of caller: Cain Castillo   Date of death: 02-Aug-2023   Name of funeral home: [facility]  Phone number of funeral home: [phone]  Provider that needs to sign form: Dr.James Autry Legions  Timeline for signing: ASAP(Funeral home is going mail death certificate to clinic for PCP to sign and mail back to funeral home)

## 2023-08-01 ENCOUNTER — Ambulatory Visit: Payer: Self-pay | Admitting: Internal Medicine

## 2023-08-06 ENCOUNTER — Ambulatory Visit: Admitting: Pulmonary Disease

## 2023-08-06 NOTE — Telephone Encounter (Signed)
 Copied from CRM [id number]. Topic: General - Other >> Aug 06, 2023 10:49 AM Alpha Arts wrote: Reason for CRM:   Name of caller: Grant Bowers   Date of death: 16-Aug-2023   Name of funeral home: [facility]   Phone number of funeral home: [phone]   Grant Bowers stated that she was told to mail death certificate so that the Dr. Could sign. She is wanting to know what she should do and how to go about getting it signed.

## 2023-08-08 ENCOUNTER — Encounter (HOSPITAL_COMMUNITY): Payer: Self-pay

## 2023-08-08 ENCOUNTER — Ambulatory Visit (HOSPITAL_COMMUNITY)

## 2023-08-09 DEATH — deceased

## 2023-08-17 ENCOUNTER — Ambulatory Visit: Admitting: Nurse Practitioner

## 2023-08-21 ENCOUNTER — Ambulatory Visit: Payer: Medicare HMO | Admitting: "Endocrinology

## 2023-10-09 ENCOUNTER — Ambulatory Visit: Admitting: Internal Medicine

## 2023-10-18 ENCOUNTER — Ambulatory Visit: Admitting: Pulmonary Disease
